# Patient Record
Sex: Male | Born: 1965 | Race: Black or African American | Hispanic: No | Marital: Married | Smoking: Current every day smoker
Health system: Southern US, Community
[De-identification: ages and names within clinical notes are randomized; demographics above are authoritative.]

## PROBLEM LIST (undated history)

## (undated) DIAGNOSIS — E079 Disorder of thyroid, unspecified: Secondary | ICD-10-CM

## (undated) DIAGNOSIS — E785 Hyperlipidemia, unspecified: Secondary | ICD-10-CM

## (undated) DIAGNOSIS — I2699 Other pulmonary embolism without acute cor pulmonale: Secondary | ICD-10-CM

## (undated) DIAGNOSIS — N209 Urinary calculus, unspecified: Secondary | ICD-10-CM

## (undated) DIAGNOSIS — G4730 Sleep apnea, unspecified: Secondary | ICD-10-CM

## (undated) DIAGNOSIS — R739 Hyperglycemia, unspecified: Secondary | ICD-10-CM

## (undated) DIAGNOSIS — Z7901 Long term (current) use of anticoagulants: Secondary | ICD-10-CM

## (undated) DIAGNOSIS — J309 Allergic rhinitis, unspecified: Secondary | ICD-10-CM

## (undated) DIAGNOSIS — L259 Unspecified contact dermatitis, unspecified cause: Secondary | ICD-10-CM

## (undated) DIAGNOSIS — K219 Gastro-esophageal reflux disease without esophagitis: Secondary | ICD-10-CM

## (undated) DIAGNOSIS — T7840XA Allergy, unspecified, initial encounter: Secondary | ICD-10-CM

## (undated) DIAGNOSIS — R7989 Other specified abnormal findings of blood chemistry: Secondary | ICD-10-CM

## (undated) DIAGNOSIS — N529 Male erectile dysfunction, unspecified: Secondary | ICD-10-CM

## (undated) DIAGNOSIS — E119 Type 2 diabetes mellitus without complications: Secondary | ICD-10-CM

## (undated) DIAGNOSIS — M545 Low back pain: Secondary | ICD-10-CM

## (undated) DIAGNOSIS — D72819 Decreased white blood cell count, unspecified: Secondary | ICD-10-CM

## (undated) DIAGNOSIS — M1990 Unspecified osteoarthritis, unspecified site: Secondary | ICD-10-CM

## (undated) DIAGNOSIS — F419 Anxiety disorder, unspecified: Secondary | ICD-10-CM

## (undated) DIAGNOSIS — M109 Gout, unspecified: Secondary | ICD-10-CM

## (undated) DIAGNOSIS — I82409 Acute embolism and thrombosis of unspecified deep veins of unspecified lower extremity: Secondary | ICD-10-CM

## (undated) DIAGNOSIS — I10 Essential (primary) hypertension: Secondary | ICD-10-CM

## (undated) DIAGNOSIS — I4891 Unspecified atrial fibrillation: Secondary | ICD-10-CM

## (undated) HISTORY — DX: Unspecified osteoarthritis, unspecified site: M19.90

## (undated) HISTORY — DX: Other specified abnormal findings of blood chemistry: R79.89

## (undated) HISTORY — PX: CYSTECTOMY: SUR359

## (undated) HISTORY — DX: Anxiety disorder, unspecified: F41.9

## (undated) HISTORY — DX: Sleep apnea, unspecified: G47.30

## (undated) HISTORY — DX: Hyperlipidemia, unspecified: E78.5

## (undated) HISTORY — DX: Unspecified atrial fibrillation: I48.91

## (undated) HISTORY — DX: Long term (current) use of anticoagulants: Z79.01

## (undated) HISTORY — DX: Essential (primary) hypertension: I10

## (undated) HISTORY — DX: Acute embolism and thrombosis of unspecified deep veins of unspecified lower extremity: I82.409

## (undated) HISTORY — PX: KNEE ARTHROSCOPY W/ ACL RECONSTRUCTION: SHX1858

## (undated) HISTORY — DX: Type 2 diabetes mellitus without complications: E11.9

## (undated) HISTORY — DX: Decreased white blood cell count, unspecified: D72.819

## (undated) HISTORY — DX: Allergic rhinitis, unspecified: J30.9

## (undated) HISTORY — DX: Other pulmonary embolism without acute cor pulmonale: I26.99

## (undated) HISTORY — DX: Hyperglycemia, unspecified: R73.9

## (undated) HISTORY — DX: Urinary calculus, unspecified: N20.9

## (undated) HISTORY — DX: Male erectile dysfunction, unspecified: N52.9

## (undated) HISTORY — PX: KNEE ARTHROSCOPY: SUR90

## (undated) HISTORY — DX: Allergy, unspecified, initial encounter: T78.40XA

## (undated) HISTORY — DX: Unspecified contact dermatitis, unspecified cause: L25.9

## (undated) HISTORY — DX: Gout, unspecified: M10.9

## (undated) HISTORY — DX: Low back pain: M54.5

## (undated) NOTE — *Deleted (*Deleted)
Patient ID: Spencer English                 DOB: [birth year]                      MRN: [id number]     HPI: Spencer English is a 22 y.o. male referred by Dr. Johney Frame to HTN clinic. PMH is significant for HTN, DVT, PE, pulmonary hypertension, OSA, and A Fib.  Current HTN meds: furosemide 40mg  daily, losartan 100mg  daily, Toprol 37.5mg  daily, spironolactone 25mg  daily, verapamil 240mg  daily Previously tried: hydralazine 10mg , Lopressor 25mg  BP goal:   Family History: Mother: DM, HTN, HLD.  Father: DM, HTN, HLD, MI  Social History:   Diet:   Exercise:   Home BP readings:   Wt Readings from Last 3 Encounters:  08/21/20 280 lb (127 kg)  08/09/20 273 lb (123.8 kg)  07/23/20 (!) 275 lb (124.7 kg)   BP Readings from Last 3 Encounters:  09/12/20 (!) 148/93  08/21/20 123/81  08/09/20 (!) 162/100   Pulse Readings from Last 3 Encounters:  09/12/20 76  08/21/20 65  08/09/20 84    Renal function: CrCl cannot be calculated (Patient's most recent lab result is older than the maximum 21 days allowed.).  Past Medical History:  Diagnosis Date  . ALLERGIC RHINITIS 04/12/2009  . Allergy   . Anxiety   . Arthritis   . Atrial fibrillation (HCC)   . BACK PAIN, LUMBAR 01/14/2011  . DEEP VENOUS THROMBOPHLEBITIS, LEG, LEFT 05/05/2009  . DM 08/08/2008  . DYSLIPIDEMIA 04/26/2009  . ECZEMA 05/23/2010  . 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 GERD (gastroesophageal reflux disease)   . GOUT 10/24/2010  . Hyperglycemia   . HYPERTENSION 07/21/2007  . 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 Leukopenia   . Long term (current) use of anticoagulants 04/03/2011  . PULMONARY EMBOLISM 05/03/2009  . Sleep apnea    CPAP at bedtime  . Thyroid disease    pt thinks hyperthyroidism  . UNSPECIFIED URINARY CALCULUS     Current Outpatient Medications on File Prior to Visit  Medication Sig Dispense Refill  . allopurinol (ZYLOPRIM) 300 MG tablet Take 300 mg by mouth as needed (gout flare up).    Marland Kitchen atorvastatin (LIPITOR) 20 MG  tablet Take 1 tablet (20 mg total) by mouth daily. 30 tablet 0  . FARXIGA 5 MG TABS tablet TAKE 1 TABLET BY MOUTH DAILY BEFORE BREAKFAST 30 tablet 11  . fluticasone (FLONASE) 50 MCG/ACT nasal spray Place 1 spray into both nostrils daily. allergies    . furosemide (LASIX) 40 MG tablet Take 1 tablet (40 mg total) by mouth daily. 30 tablet 1  . glucose blood (CONTOUR NEXT TEST) test strip 1 each by Other route daily. And lancets 1/day 100 each 3  . halobetasol (ULTRAVATE) 0.05 % cream APPLY CREAM TOPICALLY THREE TIMES DAILY AS NEEDED FOR RASH 15 g 0  . losartan (COZAAR) 100 MG tablet Take 1 tablet (100 mg total) by mouth daily. 30 tablet 1  . methimazole (TAPAZOLE) 10 MG tablet Take 1 tablet (10 mg total) by mouth daily. 90 tablet 3  . metoprolol succinate (TOPROL-XL) 25 MG 24 hr tablet TAKE 1 AND 1/2 TABLETS(37.5 MG) BY MOUTH DAILY 45 tablet 1  . rivaroxaban (XARELTO) 20 MG TABS tablet TAKE 1 TABLET(20 MG) BY MOUTH DAILY WITH SUPPER 30 tablet 1  . sitaGLIPtin (JANUVIA) 100 MG tablet Take 1 tablet (100 mg total) by mouth daily. 90 tablet 3  .  spironolactone (ALDACTONE) 25 MG tablet Take 1 tablet (25 mg total) by mouth daily. (Patient not taking: Reported on 08/21/2020) 30 tablet 11  . verapamil (CALAN-SR) 240 MG CR tablet Take 1 tablet (240 mg total) by mouth at bedtime. 30 tablet 1  . Verapamil HCl CR 300 MG CP24 Take 1 capsule (300 mg total) by mouth daily. 60 capsule 0   No current facility-administered medications on file prior to visit.    Allergies  Allergen Reactions  . Metformin Diarrhea and Nausea Only  . Viagra [Sildenafil Citrate] Other (See Comments)    headache     Assessment/Plan:  1. Hypertension -

---
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
## 1998-09-13 ENCOUNTER — Emergency Department (HOSPITAL_COMMUNITY): Admission: EM | Admit: 1998-09-13 | Discharge: 1998-09-13 | Payer: Self-pay

## 1998-12-29 HISTORY — PX: ANTERIOR CRUCIATE LIGAMENT REPAIR: SHX115

## 1999-05-14 ENCOUNTER — Emergency Department (HOSPITAL_COMMUNITY): Admission: EM | Admit: 1999-05-14 | Discharge: 1999-05-14 | Payer: Self-pay | Admitting: Emergency Medicine

## 1999-08-12 ENCOUNTER — Emergency Department (HOSPITAL_COMMUNITY): Admission: EM | Admit: 1999-08-12 | Discharge: 1999-08-12 | Payer: Self-pay

## 2000-03-21 ENCOUNTER — Emergency Department (HOSPITAL_COMMUNITY): Admission: EM | Admit: 2000-03-21 | Discharge: 2000-03-21 | Payer: Self-pay | Admitting: Emergency Medicine

## 2005-03-06 ENCOUNTER — Ambulatory Visit: Payer: Self-pay | Admitting: Endocrinology

## 2005-03-14 ENCOUNTER — Ambulatory Visit: Payer: Self-pay | Admitting: Endocrinology

## 2005-05-02 ENCOUNTER — Ambulatory Visit: Payer: Self-pay | Admitting: Endocrinology

## 2005-05-27 ENCOUNTER — Ambulatory Visit: Payer: Self-pay | Admitting: Internal Medicine

## 2005-08-22 ENCOUNTER — Ambulatory Visit: Payer: Self-pay | Admitting: Endocrinology

## 2006-04-02 ENCOUNTER — Ambulatory Visit: Payer: Self-pay | Admitting: Internal Medicine

## 2006-09-17 ENCOUNTER — Ambulatory Visit: Payer: Self-pay | Admitting: Endocrinology

## 2006-10-28 ENCOUNTER — Ambulatory Visit: Payer: Self-pay | Admitting: Endocrinology

## 2006-10-30 ENCOUNTER — Ambulatory Visit: Payer: Self-pay | Admitting: Endocrinology

## 2006-10-30 LAB — CONVERTED CEMR LAB
Basophils Absolute: 0 10*3/uL (ref 0.0–0.1)
CO2: 30 meq/L (ref 19–32)
Calcium: 9.2 mg/dL (ref 8.4–10.5)
Chloride: 103 meq/L (ref 96–112)
Creatinine, Ser: 1.3 mg/dL (ref 0.4–1.5)
Creatinine,U: 480 mg/dL
Glomerular Filtration Rate, Af Am: 78 mL/min/{1.73_m2}
Glucose, Bld: 112 mg/dL — ABNORMAL HIGH (ref 70–99)
Iron: 79 ug/dL (ref 42–165)
Lymphocytes Relative: 32.6 % (ref 12.0–46.0)
MCV: 84.1 fL (ref 78.0–100.0)
Microalb, Ur: 1.2 mg/dL (ref 0.0–1.9)
Monocytes Absolute: 0.6 10*3/uL (ref 0.2–0.7)
Monocytes Relative: 12.7 % — ABNORMAL HIGH (ref 3.0–11.0)
Nitrite: NEGATIVE
Platelets: 217 10*3/uL (ref 150–400)
TSH: 0.85 microintl units/mL (ref 0.35–5.50)
Testosterone, total: 3.715 ng/mL
Transferrin: 240.5 mg/dL (ref >=212.0)
Triglyceride fasting, serum: 55 mg/dL (ref 0–149)
Urine Glucose: NEGATIVE mg/dL

## 2006-11-03 ENCOUNTER — Ambulatory Visit: Payer: Self-pay | Admitting: Endocrinology

## 2006-12-19 ENCOUNTER — Ambulatory Visit: Payer: Self-pay | Admitting: Internal Medicine

## 2007-04-19 ENCOUNTER — Ambulatory Visit: Payer: Self-pay | Admitting: Internal Medicine

## 2007-04-21 ENCOUNTER — Emergency Department (HOSPITAL_COMMUNITY): Admission: EM | Admit: 2007-04-21 | Discharge: 2007-04-22 | Payer: Self-pay | Admitting: Emergency Medicine

## 2007-06-11 ENCOUNTER — Ambulatory Visit: Payer: Self-pay | Admitting: Endocrinology

## 2007-07-05 ENCOUNTER — Encounter: Admission: RE | Admit: 2007-07-05 | Discharge: 2007-07-05 | Payer: Self-pay | Admitting: Internal Medicine

## 2007-07-21 ENCOUNTER — Encounter: Payer: Self-pay | Admitting: Endocrinology

## 2007-07-21 DIAGNOSIS — I10 Essential (primary) hypertension: Secondary | ICD-10-CM | POA: Insufficient documentation

## 2007-07-21 HISTORY — DX: Essential (primary) hypertension: I10

## 2007-09-16 ENCOUNTER — Encounter: Admission: RE | Admit: 2007-09-16 | Discharge: 2007-12-08 | Payer: Self-pay | Admitting: Internal Medicine

## 2007-09-22 ENCOUNTER — Emergency Department (HOSPITAL_COMMUNITY): Admission: EM | Admit: 2007-09-22 | Discharge: 2007-09-22 | Payer: Self-pay | Admitting: Emergency Medicine

## 2008-01-31 ENCOUNTER — Ambulatory Visit: Payer: Self-pay | Admitting: Endocrinology

## 2008-01-31 DIAGNOSIS — L723 Sebaceous cyst: Secondary | ICD-10-CM | POA: Insufficient documentation

## 2008-01-31 DIAGNOSIS — N529 Male erectile dysfunction, unspecified: Secondary | ICD-10-CM | POA: Insufficient documentation

## 2008-01-31 HISTORY — DX: Male erectile dysfunction, unspecified: N52.9

## 2008-02-01 ENCOUNTER — Telehealth (INDEPENDENT_AMBULATORY_CARE_PROVIDER_SITE_OTHER): Payer: Self-pay | Admitting: *Deleted

## 2008-02-14 ENCOUNTER — Ambulatory Visit: Payer: Self-pay | Admitting: Endocrinology

## 2008-02-14 ENCOUNTER — Encounter (INDEPENDENT_AMBULATORY_CARE_PROVIDER_SITE_OTHER): Payer: Self-pay | Admitting: *Deleted

## 2008-03-02 ENCOUNTER — Encounter: Payer: Self-pay | Admitting: Endocrinology

## 2008-04-01 ENCOUNTER — Emergency Department (HOSPITAL_COMMUNITY): Admission: EM | Admit: 2008-04-01 | Discharge: 2008-04-01 | Payer: Self-pay | Admitting: Family Medicine

## 2008-04-13 ENCOUNTER — Telehealth: Payer: Self-pay | Admitting: Internal Medicine

## 2008-04-17 ENCOUNTER — Telehealth (INDEPENDENT_AMBULATORY_CARE_PROVIDER_SITE_OTHER): Payer: Self-pay | Admitting: *Deleted

## 2008-05-26 ENCOUNTER — Encounter (INDEPENDENT_AMBULATORY_CARE_PROVIDER_SITE_OTHER): Payer: Self-pay | Admitting: General Surgery

## 2008-05-26 ENCOUNTER — Ambulatory Visit (HOSPITAL_BASED_OUTPATIENT_CLINIC_OR_DEPARTMENT_OTHER): Admission: RE | Admit: 2008-05-26 | Discharge: 2008-05-26 | Payer: Self-pay | Admitting: General Surgery

## 2008-06-06 ENCOUNTER — Encounter: Admission: RE | Admit: 2008-06-06 | Discharge: 2008-06-06 | Payer: Self-pay | Admitting: Internal Medicine

## 2008-06-20 ENCOUNTER — Telehealth (INDEPENDENT_AMBULATORY_CARE_PROVIDER_SITE_OTHER): Payer: Self-pay | Admitting: *Deleted

## 2008-06-20 ENCOUNTER — Ambulatory Visit: Payer: Self-pay | Admitting: Vascular Surgery

## 2008-06-20 ENCOUNTER — Emergency Department (HOSPITAL_COMMUNITY): Admission: EM | Admit: 2008-06-20 | Discharge: 2008-06-20 | Payer: Self-pay | Admitting: Emergency Medicine

## 2008-06-20 ENCOUNTER — Encounter (INDEPENDENT_AMBULATORY_CARE_PROVIDER_SITE_OTHER): Payer: Self-pay | Admitting: Emergency Medicine

## 2008-08-08 ENCOUNTER — Ambulatory Visit: Payer: Self-pay | Admitting: Endocrinology

## 2008-08-08 DIAGNOSIS — E1169 Type 2 diabetes mellitus with other specified complication: Secondary | ICD-10-CM | POA: Insufficient documentation

## 2008-08-08 DIAGNOSIS — E119 Type 2 diabetes mellitus without complications: Secondary | ICD-10-CM

## 2008-08-08 DIAGNOSIS — E118 Type 2 diabetes mellitus with unspecified complications: Secondary | ICD-10-CM | POA: Insufficient documentation

## 2008-08-08 HISTORY — DX: Type 2 diabetes mellitus without complications: E11.9

## 2008-08-08 LAB — CONVERTED CEMR LAB: Hgb A1c MFr Bld: 7.3 % — ABNORMAL HIGH (ref 4.6–6.0)

## 2008-08-14 ENCOUNTER — Telehealth: Payer: Self-pay | Admitting: Endocrinology

## 2008-10-30 ENCOUNTER — Telehealth (INDEPENDENT_AMBULATORY_CARE_PROVIDER_SITE_OTHER): Payer: Self-pay | Admitting: *Deleted

## 2008-11-10 ENCOUNTER — Ambulatory Visit: Payer: Self-pay | Admitting: Endocrinology

## 2008-11-10 LAB — CONVERTED CEMR LAB: Hgb A1c MFr Bld: 7.2 % — ABNORMAL HIGH (ref 4.6–6.0)

## 2008-12-15 ENCOUNTER — Telehealth (INDEPENDENT_AMBULATORY_CARE_PROVIDER_SITE_OTHER): Payer: Self-pay | Admitting: *Deleted

## 2009-01-23 ENCOUNTER — Ambulatory Visit: Payer: Self-pay | Admitting: Endocrinology

## 2009-01-23 DIAGNOSIS — R05 Cough: Secondary | ICD-10-CM

## 2009-01-23 DIAGNOSIS — R059 Cough, unspecified: Secondary | ICD-10-CM | POA: Insufficient documentation

## 2009-01-24 ENCOUNTER — Telehealth (INDEPENDENT_AMBULATORY_CARE_PROVIDER_SITE_OTHER): Payer: Self-pay | Admitting: *Deleted

## 2009-03-11 ENCOUNTER — Emergency Department (HOSPITAL_COMMUNITY): Admission: EM | Admit: 2009-03-11 | Discharge: 2009-03-11 | Payer: Self-pay | Admitting: Emergency Medicine

## 2009-03-11 ENCOUNTER — Ambulatory Visit: Payer: Self-pay | Admitting: Vascular Surgery

## 2009-03-11 ENCOUNTER — Encounter (INDEPENDENT_AMBULATORY_CARE_PROVIDER_SITE_OTHER): Payer: Self-pay | Admitting: Emergency Medicine

## 2009-04-12 ENCOUNTER — Ambulatory Visit: Payer: Self-pay | Admitting: Endocrinology

## 2009-04-12 DIAGNOSIS — M25579 Pain in unspecified ankle and joints of unspecified foot: Secondary | ICD-10-CM | POA: Insufficient documentation

## 2009-04-12 DIAGNOSIS — J309 Allergic rhinitis, unspecified: Secondary | ICD-10-CM | POA: Insufficient documentation

## 2009-04-12 HISTORY — DX: Allergic rhinitis, unspecified: J30.9

## 2009-04-25 ENCOUNTER — Ambulatory Visit: Payer: Self-pay | Admitting: Endocrinology

## 2009-04-26 ENCOUNTER — Observation Stay (HOSPITAL_COMMUNITY): Admission: EM | Admit: 2009-04-26 | Discharge: 2009-04-30 | Payer: Self-pay | Admitting: Emergency Medicine

## 2009-04-26 ENCOUNTER — Ambulatory Visit: Payer: Self-pay | Admitting: Internal Medicine

## 2009-04-26 ENCOUNTER — Encounter: Payer: Self-pay | Admitting: Internal Medicine

## 2009-04-26 DIAGNOSIS — E785 Hyperlipidemia, unspecified: Secondary | ICD-10-CM

## 2009-04-26 DIAGNOSIS — R079 Chest pain, unspecified: Secondary | ICD-10-CM | POA: Insufficient documentation

## 2009-04-26 HISTORY — DX: Hyperlipidemia, unspecified: E78.5

## 2009-04-27 ENCOUNTER — Ambulatory Visit: Payer: Self-pay | Admitting: Vascular Surgery

## 2009-04-27 ENCOUNTER — Encounter: Payer: Self-pay | Admitting: Internal Medicine

## 2009-04-27 LAB — CONVERTED CEMR LAB
ALT: 33 units/L (ref 0–53)
BUN: 17 mg/dL (ref 6–23)
Basophils Absolute: 0 10*3/uL (ref 0.0–0.1)
Bilirubin, Direct: 0.3 mg/dL (ref 0.0–0.3)
Chloride: 99 meq/L (ref 96–112)
Cholesterol: 224 mg/dL — ABNORMAL HIGH (ref 0–200)
Creatinine, Ser: 1.5 mg/dL (ref 0.4–1.5)
Creatinine,U: 385.6 mg/dL
Eosinophils Absolute: 0.3 10*3/uL (ref 0.0–0.7)
Eosinophils Relative: 5.6 % — ABNORMAL HIGH (ref 0.0–5.0)
Glucose, Bld: 140 mg/dL — ABNORMAL HIGH (ref 70–99)
HDL: 37.1 mg/dL — ABNORMAL LOW (ref >39.00)
Hemoglobin, Urine: NEGATIVE
Hgb A1c MFr Bld: 7.2 % — ABNORMAL HIGH (ref 4.6–6.5)
Ketones, ur: NEGATIVE mg/dL
Lymphs Abs: 1.4 10*3/uL (ref 0.7–4.0)
MCV: 82.3 fL (ref 78.0–100.0)
Microalb Creat Ratio: 4.7 mg/g (ref 0.0–30.0)
Microalb, Ur: 1.8 mg/dL (ref 0.0–1.9)
Monocytes Absolute: 0.6 10*3/uL (ref 0.1–1.0)
Neutrophils Relative %: 56.5 % (ref 43.0–77.0)
PSA: 0.99 ng/mL (ref 0.10–4.00)
Platelets: 187 10*3/uL (ref 150.0–400.0)
RDW: 15.1 % — ABNORMAL HIGH (ref 11.5–14.6)
TSH: 0.84 microintl units/mL (ref 0.35–5.50)
Total Bilirubin: 2 mg/dL — ABNORMAL HIGH (ref 0.3–1.2)
Urine Glucose: NEGATIVE mg/dL
Urobilinogen, UA: 0.2 (ref 0.0–1.0)
VLDL: 11.6 mg/dL (ref 0.0–40.0)
Vitamin B-12: 343 pg/mL (ref 211–911)
WBC: 5.4 10*3/uL (ref 4.5–10.5)

## 2009-05-01 ENCOUNTER — Ambulatory Visit: Payer: Self-pay | Admitting: Cardiology

## 2009-05-01 LAB — CONVERTED CEMR LAB: INR: 2.9 — ABNORMAL HIGH (ref 0.8–1.0)

## 2009-05-03 ENCOUNTER — Encounter (INDEPENDENT_AMBULATORY_CARE_PROVIDER_SITE_OTHER): Payer: Self-pay | Admitting: *Deleted

## 2009-05-03 ENCOUNTER — Telehealth (INDEPENDENT_AMBULATORY_CARE_PROVIDER_SITE_OTHER): Payer: Self-pay | Admitting: *Deleted

## 2009-05-03 ENCOUNTER — Ambulatory Visit: Payer: Self-pay | Admitting: Endocrinology

## 2009-05-03 DIAGNOSIS — I2782 Chronic pulmonary embolism: Secondary | ICD-10-CM | POA: Insufficient documentation

## 2009-05-03 DIAGNOSIS — I2699 Other pulmonary embolism without acute cor pulmonale: Secondary | ICD-10-CM

## 2009-05-03 HISTORY — DX: Other pulmonary embolism without acute cor pulmonale: I26.99

## 2009-05-04 ENCOUNTER — Telehealth (INDEPENDENT_AMBULATORY_CARE_PROVIDER_SITE_OTHER): Payer: Self-pay | Admitting: *Deleted

## 2009-05-05 ENCOUNTER — Ambulatory Visit: Payer: Self-pay | Admitting: Internal Medicine

## 2009-05-05 ENCOUNTER — Telehealth (INDEPENDENT_AMBULATORY_CARE_PROVIDER_SITE_OTHER): Payer: Self-pay | Admitting: *Deleted

## 2009-05-05 DIAGNOSIS — I82409 Acute embolism and thrombosis of unspecified deep veins of unspecified lower extremity: Secondary | ICD-10-CM

## 2009-05-05 DIAGNOSIS — M79609 Pain in unspecified limb: Secondary | ICD-10-CM | POA: Insufficient documentation

## 2009-05-05 HISTORY — DX: Acute embolism and thrombosis of unspecified deep veins of unspecified lower extremity: I82.409

## 2009-05-08 ENCOUNTER — Ambulatory Visit: Payer: Self-pay | Admitting: Internal Medicine

## 2009-05-10 ENCOUNTER — Telehealth (INDEPENDENT_AMBULATORY_CARE_PROVIDER_SITE_OTHER): Payer: Self-pay | Admitting: *Deleted

## 2009-05-17 ENCOUNTER — Ambulatory Visit: Payer: Self-pay | Admitting: Internal Medicine

## 2009-05-17 ENCOUNTER — Ambulatory Visit: Payer: Self-pay | Admitting: Pulmonary Disease

## 2009-05-30 ENCOUNTER — Encounter: Payer: Self-pay | Admitting: *Deleted

## 2009-05-31 ENCOUNTER — Ambulatory Visit: Payer: Self-pay | Admitting: Internal Medicine

## 2009-05-31 LAB — CONVERTED CEMR LAB
POC INR: 3
Protime: 20.9

## 2009-06-21 ENCOUNTER — Ambulatory Visit: Payer: Self-pay | Admitting: Internal Medicine

## 2009-06-21 LAB — CONVERTED CEMR LAB: Prothrombin Time: 12.7 s

## 2009-07-04 ENCOUNTER — Encounter: Payer: Self-pay | Admitting: *Deleted

## 2009-07-04 ENCOUNTER — Encounter (INDEPENDENT_AMBULATORY_CARE_PROVIDER_SITE_OTHER): Payer: Self-pay | Admitting: Cardiology

## 2009-07-04 ENCOUNTER — Ambulatory Visit: Payer: Self-pay | Admitting: Internal Medicine

## 2009-07-04 LAB — CONVERTED CEMR LAB: Prothrombin Time: 19.2 s

## 2009-08-07 ENCOUNTER — Telehealth (INDEPENDENT_AMBULATORY_CARE_PROVIDER_SITE_OTHER): Payer: Self-pay | Admitting: *Deleted

## 2009-08-07 ENCOUNTER — Telehealth (INDEPENDENT_AMBULATORY_CARE_PROVIDER_SITE_OTHER): Payer: Self-pay | Admitting: Cardiology

## 2009-08-13 ENCOUNTER — Telehealth (INDEPENDENT_AMBULATORY_CARE_PROVIDER_SITE_OTHER): Payer: Self-pay | Admitting: *Deleted

## 2009-08-16 ENCOUNTER — Ambulatory Visit: Payer: Self-pay | Admitting: Cardiology

## 2009-08-16 ENCOUNTER — Encounter (INDEPENDENT_AMBULATORY_CARE_PROVIDER_SITE_OTHER): Payer: Self-pay | Admitting: *Deleted

## 2009-08-17 ENCOUNTER — Telehealth: Payer: Self-pay | Admitting: Endocrinology

## 2009-09-06 ENCOUNTER — Ambulatory Visit: Payer: Self-pay | Admitting: Cardiovascular Disease

## 2009-09-06 LAB — CONVERTED CEMR LAB: POC INR: 2.3

## 2009-09-24 ENCOUNTER — Ambulatory Visit: Payer: Self-pay | Admitting: Endocrinology

## 2009-09-25 ENCOUNTER — Ambulatory Visit: Payer: Self-pay | Admitting: Endocrinology

## 2009-09-25 LAB — CONVERTED CEMR LAB
Creatinine,U: 105.8 mg/dL
Hgb A1c MFr Bld: 6.8 % — ABNORMAL HIGH (ref 4.6–6.5)
Microalb Creat Ratio: 1.9 mg/g (ref 0.0–30.0)
Sed Rate: 9 mm/hr (ref 0–22)
Testosterone: 405.07 ng/dL (ref 350.00–890.00)

## 2009-10-04 ENCOUNTER — Ambulatory Visit: Payer: Self-pay | Admitting: Internal Medicine

## 2009-10-04 LAB — CONVERTED CEMR LAB: POC INR: 1.9

## 2009-10-11 ENCOUNTER — Telehealth: Payer: Self-pay | Admitting: Endocrinology

## 2009-10-25 ENCOUNTER — Ambulatory Visit: Payer: Self-pay | Admitting: Endocrinology

## 2009-10-25 ENCOUNTER — Ambulatory Visit: Payer: Self-pay | Admitting: Cardiology

## 2009-10-25 DIAGNOSIS — R7989 Other specified abnormal findings of blood chemistry: Secondary | ICD-10-CM | POA: Insufficient documentation

## 2009-10-25 DIAGNOSIS — R609 Edema, unspecified: Secondary | ICD-10-CM | POA: Insufficient documentation

## 2009-10-25 HISTORY — DX: Other specified abnormal findings of blood chemistry: R79.89

## 2009-10-25 LAB — CONVERTED CEMR LAB
BUN: 14 mg/dL (ref 6–23)
CO2: 28 meq/L (ref 19–32)
Glucose, Bld: 85 mg/dL (ref 70–99)
Potassium: 4 meq/L (ref 3.5–5.1)
Sodium: 137 meq/L (ref 135–145)

## 2009-11-15 ENCOUNTER — Ambulatory Visit: Payer: Self-pay | Admitting: Cardiology

## 2009-11-21 ENCOUNTER — Telehealth: Payer: Self-pay | Admitting: Internal Medicine

## 2009-12-06 ENCOUNTER — Ambulatory Visit: Payer: Self-pay | Admitting: Endocrinology

## 2009-12-11 ENCOUNTER — Telehealth: Payer: Self-pay | Admitting: Endocrinology

## 2009-12-17 ENCOUNTER — Emergency Department (HOSPITAL_BASED_OUTPATIENT_CLINIC_OR_DEPARTMENT_OTHER): Admission: EM | Admit: 2009-12-17 | Discharge: 2009-12-17 | Payer: Self-pay | Admitting: Emergency Medicine

## 2010-01-24 ENCOUNTER — Ambulatory Visit: Payer: Self-pay | Admitting: Internal Medicine

## 2010-01-24 LAB — CONVERTED CEMR LAB: POC INR: 1.2

## 2010-02-20 ENCOUNTER — Emergency Department (HOSPITAL_BASED_OUTPATIENT_CLINIC_OR_DEPARTMENT_OTHER): Admission: EM | Admit: 2010-02-20 | Discharge: 2010-02-20 | Payer: Self-pay | Admitting: Emergency Medicine

## 2010-03-06 ENCOUNTER — Ambulatory Visit: Payer: Self-pay | Admitting: Cardiovascular Disease

## 2010-03-06 LAB — CONVERTED CEMR LAB: POC INR: 2.1

## 2010-03-25 ENCOUNTER — Emergency Department (HOSPITAL_BASED_OUTPATIENT_CLINIC_OR_DEPARTMENT_OTHER): Admission: EM | Admit: 2010-03-25 | Discharge: 2010-03-25 | Payer: Self-pay | Admitting: Emergency Medicine

## 2010-04-04 ENCOUNTER — Ambulatory Visit: Payer: Self-pay | Admitting: Cardiovascular Disease

## 2010-05-23 ENCOUNTER — Ambulatory Visit: Payer: Self-pay | Admitting: Endocrinology

## 2010-05-23 DIAGNOSIS — L259 Unspecified contact dermatitis, unspecified cause: Secondary | ICD-10-CM

## 2010-05-23 HISTORY — DX: Unspecified contact dermatitis, unspecified cause: L25.9

## 2010-06-20 ENCOUNTER — Encounter (INDEPENDENT_AMBULATORY_CARE_PROVIDER_SITE_OTHER): Payer: Self-pay | Admitting: Pharmacist

## 2010-06-20 ENCOUNTER — Ambulatory Visit: Payer: Self-pay | Admitting: Internal Medicine

## 2010-06-20 LAB — CONVERTED CEMR LAB: POC INR: 1.5

## 2010-06-29 ENCOUNTER — Emergency Department (HOSPITAL_BASED_OUTPATIENT_CLINIC_OR_DEPARTMENT_OTHER): Admission: EM | Admit: 2010-06-29 | Discharge: 2010-06-29 | Payer: Self-pay | Admitting: Emergency Medicine

## 2010-07-04 ENCOUNTER — Ambulatory Visit: Payer: Self-pay | Admitting: Internal Medicine

## 2010-07-04 ENCOUNTER — Ambulatory Visit: Payer: Self-pay | Admitting: Endocrinology

## 2010-07-04 DIAGNOSIS — N209 Urinary calculus, unspecified: Secondary | ICD-10-CM | POA: Insufficient documentation

## 2010-07-04 HISTORY — DX: Urinary calculus, unspecified: N20.9

## 2010-07-04 LAB — CONVERTED CEMR LAB: POC INR: 1.6

## 2010-07-05 LAB — CONVERTED CEMR LAB
AST: 23 units/L (ref 0–37)
Albumin: 3.8 g/dL (ref 3.5–5.2)
BUN: 17 mg/dL (ref 6–23)
Basophils Absolute: 0.1 10*3/uL (ref 0.0–0.1)
Bilirubin Urine: NEGATIVE
CO2: 30 meq/L (ref 19–32)
Chloride: 106 meq/L (ref 96–112)
Cholesterol: 172 mg/dL (ref 0–200)
Glucose, Bld: 94 mg/dL (ref 70–99)
HCT: 41.9 % (ref 39.0–52.0)
Hemoglobin: 14 g/dL (ref 13.0–17.0)
Lymphs Abs: 1.6 10*3/uL (ref 0.7–4.0)
MCHC: 33.5 g/dL (ref 30.0–36.0)
MCV: 82.8 fL (ref 78.0–100.0)
Monocytes Absolute: 0.7 10*3/uL (ref 0.1–1.0)
Monocytes Relative: 11.7 % (ref 3.0–12.0)
Neutro Abs: 3 10*3/uL (ref 1.4–7.7)
Nitrite: NEGATIVE
PSA: 0.78 ng/mL (ref 0.10–4.00)
Platelets: 186 10*3/uL (ref 150.0–400.0)
Potassium: 4.5 meq/L (ref 3.5–5.1)
RDW: 16.6 % — ABNORMAL HIGH (ref 11.5–14.6)
Sodium: 142 meq/L (ref 135–145)
TSH: 0.8 microintl units/mL (ref 0.35–5.50)
Total Protein, Urine: NEGATIVE mg/dL
Uric Acid, Serum: 7.7 mg/dL (ref 4.0–7.8)
Urine Glucose: NEGATIVE mg/dL
pH: 7 (ref 5.0–8.0)

## 2010-07-11 ENCOUNTER — Ambulatory Visit: Payer: Self-pay | Admitting: Internal Medicine

## 2010-08-01 ENCOUNTER — Encounter: Payer: Self-pay | Admitting: Endocrinology

## 2010-08-28 ENCOUNTER — Emergency Department (HOSPITAL_BASED_OUTPATIENT_CLINIC_OR_DEPARTMENT_OTHER): Admission: EM | Admit: 2010-08-28 | Discharge: 2010-08-29 | Payer: Self-pay | Admitting: Emergency Medicine

## 2010-08-29 ENCOUNTER — Telehealth: Payer: Self-pay | Admitting: Endocrinology

## 2010-09-12 ENCOUNTER — Ambulatory Visit: Payer: Self-pay | Admitting: Cardiology

## 2010-09-12 ENCOUNTER — Encounter (INDEPENDENT_AMBULATORY_CARE_PROVIDER_SITE_OTHER): Payer: Self-pay | Admitting: Pharmacist

## 2010-09-12 LAB — CONVERTED CEMR LAB: POC INR: 2.2

## 2010-10-16 ENCOUNTER — Ambulatory Visit: Payer: Self-pay | Admitting: Internal Medicine

## 2010-10-16 LAB — CONVERTED CEMR LAB: POC INR: 1.5

## 2010-10-24 ENCOUNTER — Ambulatory Visit: Payer: Self-pay | Admitting: Endocrinology

## 2010-10-24 DIAGNOSIS — M109 Gout, unspecified: Secondary | ICD-10-CM

## 2010-10-24 HISTORY — DX: Gout, unspecified: M10.9

## 2010-11-13 ENCOUNTER — Telehealth: Payer: Self-pay | Admitting: Endocrinology

## 2010-12-30 ENCOUNTER — Emergency Department (HOSPITAL_BASED_OUTPATIENT_CLINIC_OR_DEPARTMENT_OTHER)
Admission: EM | Admit: 2010-12-30 | Discharge: 2010-12-30 | Payer: Self-pay | Source: Home / Self Care | Admitting: Emergency Medicine

## 2011-01-01 ENCOUNTER — Telehealth: Payer: Self-pay | Admitting: Cardiology

## 2011-01-06 ENCOUNTER — Ambulatory Visit: Admit: 2011-01-06 | Payer: Self-pay

## 2011-01-08 ENCOUNTER — Ambulatory Visit
Admission: RE | Admit: 2011-01-08 | Discharge: 2011-01-08 | Payer: Self-pay | Source: Home / Self Care | Attending: Internal Medicine | Admitting: Internal Medicine

## 2011-01-14 ENCOUNTER — Ambulatory Visit
Admission: RE | Admit: 2011-01-14 | Discharge: 2011-01-14 | Payer: Self-pay | Source: Home / Self Care | Attending: Endocrinology | Admitting: Endocrinology

## 2011-01-14 DIAGNOSIS — M545 Low back pain, unspecified: Secondary | ICD-10-CM | POA: Insufficient documentation

## 2011-01-14 HISTORY — DX: Low back pain, unspecified: M54.50

## 2011-01-22 ENCOUNTER — Encounter: Payer: Self-pay | Admitting: Endocrinology

## 2011-01-23 ENCOUNTER — Ambulatory Visit: Admit: 2011-01-23 | Payer: Self-pay

## 2011-01-30 NOTE — Medication Information (Signed)
Summary: rov/tm   Anticoagulant Therapy  Managed by: Weston Brass, PharmD PCP: Minus Breeding MD Supervising MD: Graciela Husbands MD, Viviann Spare Indication 1: Pulmonary Embolism and Infarction (ICD-415.1) Indication 2: Deep Vein Thrombosis - Leg (ICD-451.1) Lab Used: [facility] Site: [facility] INR POC 1.5 INR RANGE 2 - 3  Dietary changes: no    Health status changes: no    Bleeding/hemorrhagic complications: no    Recent/future hospitalizations: no    Any changes in medication regimen? no    Recent/future dental: no  Any missed doses?: yes     Details: Reports missing 1-2x, but can't remember when.    Is patient compliant with meds? yes      Comments: Reports reducing doses this week, because he knew he was going to run out of pills.     Allergies: 1)  ! Metformin Hcl (Metformin Hcl)  Anticoagulation Management History:      The patient is taking warfarin and comes in today for a routine follow up visit.  Positive risk factors for bleeding include presence of serious comorbidities.  Negative risk factors for bleeding include an age less than 29 years old.  The bleeding index is 'intermediate risk'.  Positive CHADS2 values include History of HTN and History of Diabetes.  Negative CHADS2 values include Age > 72 years old.  The start date was 05/01/2009.  His last INR was 2.9 ratio.  Anticoagulation responsible provider: Graciela Husbands MD, Viviann Spare.  INR POC: 1.5.  Cuvette Lot#: [id number].  Exp: 09/2011.    Anticoagulation Management Assessment/Plan:      The patient's current anticoagulation dose is Warfarin sodium 5 mg tabs: Take as directed by coumadin clinic..  The target INR is 2 - 3.  The next INR is due 10/31/2010.  Anticoagulation instructions were given to patient.  Results were reviewed/authorized by Weston Brass, PharmD.  He was notified by Haynes Hoehn, PharmD Candidate.         Prior Anticoagulation Instructions: INR 2.2 Continue 7.5mg s daily except 5mg s on Mondays and Fridays. Recheck in 4  weeks.   Current Anticoagulation Instructions: INR 1.5    Take 2 tables today and tomorrow, then resume normal Coumadin schedule of 1.5 tablets every day of the week, except 1 tablet on Monday and Friday.  Return to clinic in 2 weeks.  Prescriptions: WARFARIN SODIUM 5 MG TABS (WARFARIN SODIUM) Take as directed by coumadin clinic.  #90 x 0   Entered by:   Weston Brass PharmD   Authorized by:   Nathen May, MD, [facility]   Signed by:   Weston Brass PharmD on 10/16/2010   Method used:   Electronically to        [facility] [phone]* (retail)       [address]       [place], [place]  [address]       Ph: [phone]       Fax: [phone]   RxID:   [id number]

## 2011-01-30 NOTE — Assessment & Plan Note (Signed)
Summary: flu shot/medication/#/cd   Vital Signs:  Patient profile:   45 year old male Height:      72 inches (182.88 cm) Weight:      263.13 pounds (119.60 kg) BMI:     35.82 O2 Sat:      99 % on Room air Temp:     98.6 degrees F (37.00 degrees C) oral Pulse rate:   98 / minute BP sitting:   128 / 96  (left arm) Cuff size:   large  Vitals Entered By: Brenton Grills MA (October 24, 2010 3:34 PM)  O2 Flow:  Room air CC: Refills on Cialis, Januvia, Benicar, Halobetasol cream, rash on legs has returned/aj Is Patient Diabetic? Yes   Primary Provider:  Minus Breeding MD  CC:  Refills on Cialis, Januvia, Benicar, Halobetasol cream, and rash on legs has returned/aj.  History of Present Illness: pt states few mos of moderate itching of the right hand left thigh, upper-mid-back, and both legs.  there is assoc hyperpigmentation.  ultravate helps only temporarily.   he has left elbow pain--pt says due to gout.     Current Medications (verified): 1)  Cialis 2.5 Mg  Tabs (Tadalafil) .... Qd 2)  Allegra 180 Mg  Tabs (Fexofenadine Hcl) .Marland Kitchen.. 1 By Mouth Qd 3)  Flonase 50 Mcg/act  Susp (Fluticasone Propionate) .... 2 Sprays Ea Side Qd 4)  Januvia 100 Mg  Tabs (Sitagliptin Phosphate) .... Take 1 By Mouth Qd 5)  Furosemide 40 Mg  Tabs (Furosemide) .... Take 1 By Mouth Once Daily Need Physical Appt No Addtional Refills Until Appt 6)  Hydrocodone-Acetaminophen 10-325 Mg Tabs (Hydrocodone-Acetaminophen) .Marland Kitchen.. 1 Q4h As Needed Pain 7)  Ted Hose To Knees .Marland Kitchen.. 415.19 8)  Warfarin Sodium 5 Mg Tabs (Warfarin Sodium) .... Take As Directed By Coumadin Clinic. 9)  Triamcinolone Acetonide 0.025 % Crea (Triamcinolone Acetonide) .... Three Times A Day As Needed Itching 10)  Ultravate 0.05 % Oint (Halobetasol Propionate) .... Three Times A Day As Needed Itching 11)  Benicar 40 Mg Tabs (Olmesartan Medoxomil) .Marland Kitchen.. 1 Qd 12)  Verapamil Hcl Cr 180 Mg Cr-Tabs (Verapamil Hcl) .Marland Kitchen.. 1 Once Daily 13)  Halobetasol  Propionate 0.05 % Crea (Halobetasol Propionate) .... Three Times A Day As Needed For Rash 14)  Crestor 40 Mg Tabs (Rosuvastatin Calcium) .Marland Kitchen.. 1 Once Daily 15)  Allopurinol 100 Mg Tabs (Allopurinol) .Marland Kitchen.. 1 Once Daily  Allergies (verified): 1)  ! Metformin Hcl (Metformin Hcl)  Past History:  Past Medical History: Last updated: 05/05/2009 Hypertension Smoker Edema Leukopenia Hyperglycemia LE Varicosities Hyperuricemia E.D. PE DVT  Review of Systems  The patient denies weight loss and weight gain.    Physical Exam  General:  obese.  no distress  Msk:  left elbow:  there is slight swelling, and minimal warmth.  no erythema Extremities:  trace right pedal edema and trace left pedal edema.   Skin:  there is a hyperpigmented, eczematous rash, at these areas.    Impression & Recommendations:  Problem # 1:  ECZEMA (ICD-692.9) Assessment Deteriorated  Problem # 2:  GOUT (ICD-274.9) Assessment: Deteriorated  Problem # 3:  DYSLIPIDEMIA (ICD-272.4) needs increased rx CHOL: 172 (07/04/2010)   LDL: 108 (07/04/2010)   HDL: 41.30 (07/04/2010)   TG: 112.0 (07/04/2010)  Medications Added to Medication List This Visit: 1)  Cialis 5 Mg Tabs (Tadalafil) .Marland Kitchen.. 1 tab once daily 2)  Allopurinol 300 Mg Tabs (Allopurinol) .Marland Kitchen.. 1 tab once daily 3)  Verapamil Hcl Cr 240 Mg Cr-tabs (  Verapamil hcl) .Marland Kitchen.. 1 tab once daily  Other Orders: Admin 1st Vaccine (98119) Flu Vaccine 8yrs + (14782) Dermatology Referral (Derma) Est. Patient Level IV (95621)  Patient Instructions: 1)  blood tests are being ordered for you today.  please call [phone] to hear your test results. 2)  refer dermatology.  you will be called with a day and time for an appointment. 3)  take allopurinol 300 mg once daily. 4)  take crestor 40 mg once daily. 5)  Please schedule a follow-up appointment in 3 months. Prescriptions: VERAPAMIL HCL CR 240 MG CR-TABS (VERAPAMIL HCL) 1 tab once daily  #30 x 11   Entered and  Authorized by:   Minus Breeding MD   Signed by:   Minus Breeding MD on 10/24/2010   Method used:   Electronically to        [facility] [phone]* (retail)       [address]       [place], [place]  [address]       Ph: [phone]       Fax: [phone]   RxID:   [id number] CRESTOR 40 MG TABS (ROSUVASTATIN CALCIUM) 1 once daily  #30 x 11   Entered and Authorized by:   Minus Breeding MD   Signed by:   Minus Breeding MD on 10/24/2010   Method used:   Electronically to        [facility] [phone]* (retail)       [address]       [place], [place]  [address]       Ph: [phone]       Fax: [phone]   RxID:   [id number] ALLOPURINOL 300 MG TABS (ALLOPURINOL) 1 tab once daily  #30 x 11   Entered and Authorized by:   Minus Breeding MD   Signed by:   Minus Breeding MD on 10/24/2010   Method used:   Electronically to        [facility] [phone]* (retail)       [address]       [place], [place]  [address]       Ph: [phone]       Fax: [phone]   RxID:   [id number] CIALIS 5 MG TABS (TADALAFIL) 1 tab once daily  #30 x 11   Entered and Authorized by:   Minus Breeding MD   Signed by:   Minus Breeding MD on 10/24/2010   Method used:   Print then Give to Patient   RxID:   [id number]    Orders Added: 1)  Admin 1st Vaccine [90471] 2)  Flu Vaccine 37yrs + [82423] 3)  Dermatology Referral [Derma] 4)  Est. Patient Level IV [53614]        Flu Vaccine Consent Questions     Do you have a history of severe allergic reactions to this vaccine? no    Any prior history of allergic reactions to egg and/or gelatin? no    Do you have a sensitivity to the preservative Thimersol? no    Do you have a past history of Guillan-Barre Syndrome? no    Do you currently have an acute febrile illness? no    Have you ever had a severe reaction to latex? no    Vaccine information given and explained to patient? yes    Are you currently pregnant? no     Lot Number:[id number]   Exp Date:06/28/2011  Site Given  Left Deltoid [id number]

## 2011-01-30 NOTE — Letter (Signed)
Summary: Work Excuse  [facility]  [address]   [place], [place] [address]   Phone: [phone]  Fax: [phone]    Today's Date: September 12, 2010  Name of Patient: Spencer English  The above named patient had a medical visit today at:  am / pm.  Please take this into consideration when reviewing the time away from work/school.    Special Instructions:  [  ] None  [  ] To be off the remainder of today, returning to the normal work / school schedule tomorrow.  [  ] To be off until the next scheduled appointment on ______________________.  [  ] Other ________________________________________________________________ ________________________________________________________________________   Sincerely yours,   Bethena Midget, RN, BSN

## 2011-01-30 NOTE — Progress Notes (Signed)
Summary: Rx refill req  Phone Note Refill Request Message from:  Patient on November 13, 2010 10:25 AM  Refills Requested: Medication #1:  JANUVIA 100 MG  TABS take 1 by mouth qd   Dosage confirmed as above?Dosage Confirmed   Supply Requested: 9 months  Method Requested: Electronic Initial call taken by: Margaret Pyle, CMA,  November 13, 2010 10:25 AM    Prescriptions: JANUVIA 100 MG  TABS (SITAGLIPTIN PHOSPHATE) take 1 by mouth qd  #90 x 2   Entered by:   Margaret Pyle, CMA   Authorized by:   Minus Breeding MD   Signed by:   Margaret Pyle, CMA on 11/13/2010   Method used:   Electronically to        [facility] [phone]* (retail)       [address]       [place], [place]  [address]       Ph: [phone]       Fax: [phone]   RxID:   [id number]

## 2011-01-30 NOTE — Letter (Signed)
Summary: Work Excuse  C[facility]madin  [address]   [place], [place] [address]   Phone: [phone]  Fax: [phone]    Today's Date: September 12, 2010  Name of Patient: Spencer English  The above named patient had a medical visit today   Please take this into consideration when reviewing the time away from work.  Special Instructions:  [  ] None  [  ] To be off the remainder of today, returning to the normal work / school schedule tomorrow.  [  ] To be off until the next scheduled appointment on ______________________.  [  ] Other ________________________________________________________________ ________________________________________________________________________   Sincerely yours,   Bethena Midget, RN, BSN

## 2011-01-30 NOTE — Letter (Signed)
Summary: Primary Care Appointment Letter  [facility]  [address] [place], [place] [address]   Phone: [phone]  Fax: [phone]    01/22/2011 MRN: [id number]  Spencer English [address] [place], [place]  [address]  Dear [name],   Your Primary Care Physician Minus Breeding MD has indicated that:    _______it is time to schedule an appointment.    _______you missed your appointment on______ and need to call and          reschedule.    _______you need to have lab work done.    _______you need to schedule an appointment discuss lab or test results.    ___X____you need to call to reschedule your appointment that is                       scheduled on July 10, 2011 with Dr.Kenetra Hildenbrand for a Physical.  Please call the office.     Please call our office as soon as possible. Our phone number is [phone]. Please press option 1. Our office is open 8a-12noon and 1p-5p, Monday through Friday.     Thank you,    [facility]

## 2011-01-30 NOTE — Medication Information (Signed)
Summary: rov/tm  Anticoagulant Therapy  Managed by: Elaina Pattee, PharmD PCP: Minus Breeding MD Supervising MD: Eden Emms MD, Theron Arista Indication 1: Pulmonary Embolism and Infarction (ICD-415.1) Indication 2: Deep Vein Thrombosis - Leg (ICD-451.1) Lab Used: [facility] Site: [facility] INR POC 2.3 INR RANGE 2 - 3  Dietary changes: no    Health status changes: no    Bleeding/hemorrhagic complications: no    Recent/future hospitalizations: no    Any changes in medication regimen? no    Recent/future dental: no  Any missed doses?: no       Is patient compliant with meds? yes       Allergies: 1)  ! Metformin Hcl (Metformin Hcl)  Anticoagulation Management History:      The patient is taking warfarin and comes in today for a routine follow up visit.  Positive risk factors for bleeding include presence of serious comorbidities.  Negative risk factors for bleeding include an age less than 52 years old.  The bleeding index is 'intermediate risk'.  Positive CHADS2 values include History of HTN and History of Diabetes.  Negative CHADS2 values include Age > 76 years old.  The start date was 05/01/2009.  His last INR was 2.9 ratio.  Anticoagulation responsible provider: Eden Emms MD, Theron Arista.  INR POC: 2.3.  Cuvette Lot#: [id number].  Exp: 04/2011.    Anticoagulation Management Assessment/Plan:      The patient's current anticoagulation dose is Warfarin sodium 5 mg tabs: Take as directed by coumadin clinic..  The target INR is 2 - 3.  The next INR is due 05/02/2010.  Anticoagulation instructions were given to patient.  Results were reviewed/authorized by Elaina Pattee, PharmD.  He was notified by Elaina Pattee, PharmD.         Prior Anticoagulation Instructions: INR 2.1 Continue 1.5 pills everyday except 1 pill on Mondays and Fridays. Recheck in 4 weeks.   Current Anticoagulation Instructions: INR 2.3. Take 1.5 tablets daily except 1 tablet Mon and Fri.

## 2011-01-30 NOTE — Assessment & Plan Note (Signed)
Summary: FOLLOW UP--SS   Vital Signs:  Patient profile:   45 year old male Height:      72 inches (182.88 cm) Weight:      264.25 pounds (120.11 kg) BMI:     35.97 O2 Sat:      97 % on Room air Temp:     98.7 degrees F (37.06 degrees C) oral Pulse rate:   74 / minute Pulse rhythm:   regular BP sitting:   134 / 98  (left arm) Cuff size:   large  Vitals Entered By: Brenton Grills CMA Duncan Dull) (January 14, 2011 3:21 PM)  O2 Flow:  Room air CC: Follow-up visit/refills, samples of Benicar and Cialis/Referral for back pain/aj Is Patient Diabetic? Yes   Primary Provider:  Minus Breeding MD  CC:  Follow-up visit/refills and samples of Benicar and Cialis/Referral for back pain/aj.  History of Present Illness: pt states 1 year of pain at the midline lumbar area.  x-rays were neg at ortho, approx 1 year ago.  no assoc numbness. pt says he broke a tooth 1 year ago, and it has become painful.      Current Medications (verified): 1)  Allegra 180 Mg  Tabs (Fexofenadine Hcl) .Marland Kitchen.. 1 By Mouth Qd 2)  Flonase 50 Mcg/act  Susp (Fluticasone Propionate) .... 2 Sprays Ea Side Qd 3)  Januvia 100 Mg  Tabs (Sitagliptin Phosphate) .... Take 1 By Mouth Qd 4)  Furosemide 40 Mg  Tabs (Furosemide) .... Take 1 By Mouth Once Daily Need Physical Appt No Addtional Refills Until Appt 5)  Hydrocodone-Acetaminophen 10-325 Mg Tabs (Hydrocodone-Acetaminophen) .Marland Kitchen.. 1 Q4h As Needed Pain 6)  Ted Hose To Knees .Marland Kitchen.. 415.19 7)  Warfarin Sodium 5 Mg Tabs (Warfarin Sodium) .... Take As Directed By Coumadin Clinic. 8)  Triamcinolone Acetonide 0.025 % Crea (Triamcinolone Acetonide) .... Three Times A Day As Needed Itching 9)  Benicar 40 Mg Tabs (Olmesartan Medoxomil) .Marland Kitchen.. 1 Qd 10)  Halobetasol Propionate 0.05 % Crea (Halobetasol Propionate) .... Three Times A Day As Needed For Rash 11)  Crestor 40 Mg Tabs (Rosuvastatin Calcium) .Marland Kitchen.. 1 Once Daily 12)  Cialis 5 Mg Tabs (Tadalafil) .Marland Kitchen.. 1 Tab Once Daily 13)  Allopurinol 300  Mg Tabs (Allopurinol) .Marland Kitchen.. 1 Tab Once Daily 14)  Verapamil Hcl Cr 240 Mg Cr-Tabs (Verapamil Hcl) .Marland Kitchen.. 1 Tab Once Daily  Allergies (verified): 1)  ! Metformin Hcl (Metformin Hcl)  Past History:  Past Medical History: Last updated: 05/05/2009 Hypertension Smoker Edema Leukopenia Hyperglycemia LE Varicosities Hyperuricemia E.D. PE DVT  Review of Systems       no loss of bowel or bladder function.  he says he has intermittent "gout pain," at the fingers.  Physical Exam  General:  obese.  no distress  Mouth:  a left upper molar is fractured at the gum line.  there is slight erythema, but no drainage.  Msk:  muscle bulk and strength are grossly normal.  no obvious joint swelling.  gait is normal and steady. no spinal tenderness.   Neurologic:  sensation is intact to touch on the feet.   Impression & Recommendations:  Problem # 1:  BACK PAIN, LUMBAR (ICD-724.2) uncertain etiology  Problem # 2:  dental pain related to fx tooth, but exact problem is uncertain  Problem # 3:  GOUT (ICD-274.9) Assessment: Unchanged  Medications Added to Medication List This Visit: 1)  Amoxicillin 500 Mg Tabs (Amoxicillin) .Marland Kitchen.. 1 tab three times a day 2)  Cialis 20 Mg Tabs (Tadalafil) .Marland KitchenMarland KitchenMarland Kitchen  For as needed use  Other Orders: TLB-BMP (Basic Metabolic Panel-BMET) (80048-METABOL) TLB-Uric Acid, Blood (84550-URIC) TLB-Lipid Panel (80061-LIPID) TLB-Sedimentation Rate (ESR) (85652-ESR) TLB-A1C / Hgb A1C (Glycohemoglobin) (83036-A1C) Radiology Referral (Radiology) Est. Patient Level IV ([address])  Patient Instructions: 1)  blood tests are being ordered for you today.  please call [phone] to hear your test results. 2)  check mri of the back.  you will be called with a day and time for an appointment. 3)  i sent a prescription for amoxicillin to [facility].  you need to see the dentist asap. 4)  Please schedule a physical appointment in 6 months. Prescriptions: CIALIS 20 MG TABS (TADALAFIL)  for as needed use  #5 x 11   Entered and Authorized by:   Minus Breeding MD   Signed by:   Minus Breeding MD on 01/14/2011   Method used:   Electronically to        [facility] [address] (retail)       [address]       [place], [place]  [address]       Ph: [phone] or [phone]       Fax: [phone]   RxID:   [id number] CIALIS 5 MG TABS (TADALAFIL) 1 tab once daily  #30 x 11   Entered and Authorized by:   Minus Breeding MD   Signed by:   Minus Breeding MD on 01/14/2011   Method used:   Print then Give to Patient   RxID:   [id number] BENICAR 40 MG TABS (OLMESARTAN MEDOXOMIL) 1 qd  #30 x 11   Entered and Authorized by:   Minus Breeding MD   Signed by:   Minus Breeding MD on 01/14/2011   Method used:   Electronically to        [facility] [address] (retail)       [address]       [place], [place]  [address]       Ph: [phone] or [phone]       Fax: [phone]   RxID:   [id number] AMOXICILLIN 500 MG TABS (AMOXICILLIN) 1 tab three times a day  #21 x 0   Entered and Authorized by:   Minus Breeding MD   Signed by:   Minus Breeding MD on 01/14/2011   Method used:   Electronically to        [facility] [address] (retail)       [address]       [place], [place]  [address]       Ph: [phone] or [phone]       Fax: [phone]   RxID:   [id number]    Orders Added: 1)  TLB-BMP (Basic Metabolic Panel-BMET) [80048-METABOL] 2)  TLB-Uric Acid, Blood [84550-URIC] 3)  TLB-Lipid Panel [80061-LIPID] 4)  TLB-Sedimentation Rate (ESR) [85652-ESR] 5)  TLB-A1C / Hgb A1C (Glycohemoglobin) [83036-A1C] 6)  Radiology Referral [Radiology] 7)  Est. Patient Level IV [[address]]

## 2011-01-30 NOTE — Medication Information (Signed)
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 no    Recent/future dental: no  Any missed doses?: no       Is patient compliant with meds? yes       Allergies: 1)  ! Metformin Hcl (Metformin Hcl)  Anticoagulation Management History:      The patient is taking warfarin and comes in today for a routine follow up visit.  Positive risk factors for bleeding include presence of serious comorbidities.  Negative risk factors for bleeding include an age less than 2 years old.  The bleeding index is 'intermediate risk'.  Positive CHADS2 values include History of HTN and History of Diabetes.  Negative CHADS2 values include Age > 45 years old.  The start date was 05/01/2009.  His last INR was 2.9 ratio.  Anticoagulation responsible provider: Shirlee Latch MD, Dalton.  INR POC: 2.2.  Cuvette Lot#: [id number].  Exp: 09/2011.    Anticoagulation Management Assessment/Plan:      The patient's current anticoagulation dose is Warfarin sodium 5 mg tabs: Take as directed by coumadin clinic..  The target INR is 2 - 3.  The next INR is due 10/10/2010.  Anticoagulation instructions were given to patient.  Results were reviewed/authorized by Bethena Midget, RN, BSN.  He was notified by Bethena Midget, RN, BSN.         Prior Anticoagulation Instructions: INR 1.6  Take 2 tablets today then start taing 1.5 tablets daily except 1 tablet on Mondays and Fridays.  Recheck in 2 weeks.    Current Anticoagulation Instructions: INR 2.2 Continue 7.5mg s daily except 5mg s on Mondays and Fridays.  Recheck in 4 weeks.  Prescriptions: WARFARIN SODIUM 5 MG TABS (WARFARIN SODIUM) Take as directed by coumadin clinic.  #45 x 2   Entered by:   Bethena Midget, RN, BSN   Authorized by:   Marca Ancona, MD   Signed by:   Bethena Midget, RN, BSN on 09/12/2010   Method used:   Electronically to        [facility] [phone]* (retail)       [address]       [place], [place]  [address]       Ph: [phone]       Fax: [phone]   RxID:   [id number]

## 2011-01-30 NOTE — Letter (Signed)
Summary: Work Excuse  Coumadin  [address]   [place], [place] [address]   Phone: [phone]  Fax: [phone]    Today's Date: June 20, 2010  Name of Patient: Spencer English  The above named patient had a medical visit today   Please take this into consideration when reviewing the time away from work    Special Instructions:  [  ] None  [  ] To be off the remainder of today, returning to the normal work / school schedule tomorrow.  [  ] To be off until the next scheduled appointment on ______________________.  [  ] Other ________________________________________________________________ ________________________________________________________________________   Sincerely yours,   Bethena Midget, RN, BSN

## 2011-01-30 NOTE — Medication Information (Signed)
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 yes       Details: Penicillin 500mg s TID for 14 day. Started steriod pill last wednesday takes TID for 14 days.   Recent/future dental: no  Any missed doses?: yes     Details: thinks he missed a dose last week, not sure.   Is patient compliant with meds? yes      Comments: Pt. will call with names of new meds.   Allergies: 1)  ! Metformin Hcl (Metformin Hcl)  Anticoagulation Management History:      The patient is taking warfarin and comes in today for a routine follow up visit.  Positive risk factors for bleeding include presence of serious comorbidities.  Negative risk factors for bleeding include an age less than 55 years old.  The bleeding index is 'intermediate risk'.  Positive CHADS2 values include History of HTN and History of Diabetes.  Negative CHADS2 values include Age > 17 years old.  The start date was 05/01/2009.  His last INR was 2.9 ratio.  Anticoagulation responsible provider: Eden Emms MD, Theron Arista.  INR POC: 2.1.  Cuvette Lot#: [id number].  Exp: 04/2011.    Anticoagulation Management Assessment/Plan:      The patient's current anticoagulation dose is Warfarin sodium 5 mg tabs: Take as directed by coumadin clinic..  The target INR is 2 - 3.  The next INR is due 04/04/2010.  Anticoagulation instructions were given to patient.  Results were reviewed/authorized by Bethena Midget, RN, BSN.  He was notified by Bethena Midget, RN, BSN.         Prior Anticoagulation Instructions: INR 1.2 Today take extra  5mg s( 1 pill)  and Friday take 7.5mg s ( 1.5 pills) then resume  7.5mg  everyday (1.5 pills) except 5mg s (1 pill) on Mondays and Fridays. Recheck in one week  Current Anticoagulation Instructions: INR 2.1 Continue 1.5 pills everyday except 1 pill on Mondays and Fridays. Recheck in 4 weeks.

## 2011-01-30 NOTE — Medication Information (Signed)
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 no    Recent/future dental: no  Any missed doses?: yes     Details: Missed a dose maybe on Monday, pt states he's not sure.   Is patient compliant with meds? yes      Comments: Pharmacy only gave per 10 pills thus, he took 1 tab on some day that should have been 1.5 tabs  Allergies: 1)  ! Metformin Hcl (Metformin Hcl)  Anticoagulation Management History:      The patient is taking warfarin and comes in today for a routine follow up visit.  Positive risk factors for bleeding include presence of serious comorbidities.  Negative risk factors for bleeding include an age less than 38 years old.  The bleeding index is 'intermediate risk'.  Positive CHADS2 values include History of HTN and History of Diabetes.  Negative CHADS2 values include Age > 54 years old.  The start date was 05/01/2009.  His last INR was 2.9 ratio.  Anticoagulation responsible provider: Tonio Seider MD, Fayrene Fearing.  INR POC: 1.5.  Cuvette Lot#: [id number].  Exp: 08/2011.    Anticoagulation Management Assessment/Plan:      The patient's current anticoagulation dose is Warfarin sodium 5 mg tabs: Take as directed by coumadin clinic..  The target INR is 2 - 3.  The next INR is due 07/04/2010.  Anticoagulation instructions were given to patient.  Results were reviewed/authorized by Bethena Midget, RN, BSN.  He was notified by Bethena Midget, RN, BSN.         Prior Anticoagulation Instructions: INR 2.3. Take 1.5 tablets daily except 1 tablet Mon and  Fri.  Current Anticoagulation Instructions: INR 1.5 Today take 2 pills and on Friday take 1 1/2 pills then resume 1 1/2 pills everyday except 1 pill on Mondays and Fridays. Recheck in 2 weeks.  Sample # 20 pills coumadin 5mg s Lot# [id number] Exp 12/2011

## 2011-01-30 NOTE — Medication Information (Signed)
Summary: ccr   Anticoagulant Therapy  Managed by: Louann Sjogren, PharmD PCP: Minus Breeding MD Supervising MD: Graciela Husbands MD, Viviann Spare Indication 1: Pulmonary Embolism and Infarction (ICD-415.1) Indication 2: Deep Vein Thrombosis - Leg (ICD-451.1) Lab Used: [facility] Site: [facility] INR RANGE 2 - 3  Dietary changes: no    Health status changes: no    Bleeding/hemorrhagic complications: no    Recent/future hospitalizations: no    Any changes in medication regimen? no    Recent/future dental: no  Any missed doses?: yes     Details: Missed a dose on Monday and took extra tablets the follow day.  Is patient compliant with meds? yes       Allergies: 1)  ! Metformin Hcl (Metformin Hcl)  Anticoagulation Management History:      The patient is taking warfarin and comes in today for a routine follow up visit.  Positive risk factors for bleeding include presence of serious comorbidities.  Negative risk factors for bleeding include an age less than 53 years old.  The bleeding index is 'intermediate risk'.  Positive CHADS2 values include History of HTN and History of Diabetes.  Negative CHADS2 values include Age > 4 years old.  The start date was 05/01/2009.  His last INR was 2.9 ratio and today's INR is 3.5.  Anticoagulation responsible provider: Graciela Husbands MD, Viviann Spare.  Exp: 09/2011.    Anticoagulation Management Assessment/Plan:      The patient's current anticoagulation dose is Warfarin sodium 5 mg tabs: Take as directed by coumadin clinic..  The target INR is 2 - 3.  The next INR is due 01/23/2011.  Anticoagulation instructions were given to patient.  Results were reviewed/authorized by Louann Sjogren, PharmD.         Prior Anticoagulation Instructions: INR 1.5    Take 2 tables today and tomorrow, then resume normal Coumadin schedule of 1.5 tablets every day of the week, except 1 tablet on Monday and Friday.  Return to clinic in 2 weeks.   Current Anticoagulation Instructions: INR  3.5 (goal 2-3)  Take only 1 tablet today (01/08/2011) then continue taking 1 1/2 tablets everyday except take 1 tablet on Mondays and Fridays. Prescriptions: WARFARIN SODIUM 5 MG TABS (WARFARIN SODIUM) Take as directed by coumadin clinic.  #45 x 0   Entered by:   Weston Brass PharmD   Authorized by:   Nathen May, MD, [facility]   Signed by:   Weston Brass PharmD on 01/08/2011   Method used:   Electronically to        [facility] (retail)       [address]       [place], [place]  [address]       Ph: [phone] or [phone]       Fax: [phone]   RxID:   [id number]

## 2011-01-30 NOTE — Progress Notes (Signed)
Summary: needs appt  Phone Note Other Incoming   Caller: dr Mina Marble Summary of Call: was seen today for acute gouty episode.  nees f/u soon  Follow-up for Phone Call        please call patient: needs ov here tomorrow or next week, to f/u gout Follow-up by: Minus Breeding MD,  August 29, 2010 9:54 AM  Additional Follow-up for Phone Call Additional follow up Details #1::        left message on voicemail to call back to office. Additional Follow-up by: Lucious Groves CMA,  August 29, 2010 10:22 AM    Additional Follow-up for Phone Call Additional follow up Details #2::    Pt is not longer employed at work number listed and home number was answered by a male who said that pt does not live at that number and requested we stop calling his home. Phone note closed until further contact from pt. Follow-up by: Margaret Pyle, CMA,  August 29, 2010 4:43 PM

## 2011-01-30 NOTE — Progress Notes (Signed)
Summary: refill meds   Phone Note Refill Request Call back at [phone] Message from:  Patient on January 01, 2011 2:21 PM  Refills Requested: Medication #1:  WARFARIN SODIUM 5 MG TABS Take as directed by coumadin clinic. [facility] [address].    Method Requested: Fax to Local Pharmacy Initial call taken by: Lorne Skeens,  January 01, 2011 2:22 PM    Prescriptions: WARFARIN SODIUM 5 MG TABS (WARFARIN SODIUM) Take as directed by coumadin clinic.  #10 x 0   Entered by:   Bethena Midget, RN, BSN   Authorized by:   Rollene Rotunda, MD, [facility]   Signed by:   Bethena Midget, RN, BSN on 01/01/2011   Method used:   Electronically to        [facility] [address] (retail)       [address]       [place], [place]  [address]       Ph: [phone] or [phone]       Fax: [phone]   RxID:   [id number]

## 2011-01-30 NOTE — Medication Information (Signed)
Summary: rov/tm  Anticoagulant Therapy  Managed by: Cloyde Reams, RN, BSN PCP: Minus Breeding MD Supervising MD: Ladona Ridgel MD, Sharlot Gowda Indication 1: Pulmonary Embolism and Infarction (ICD-415.1) Indication 2: Deep Vein Thrombosis - Leg (ICD-451.1) Lab Used: [facility] Site: [facility] INR POC 1.6 INR RANGE 2 - 3  Dietary changes: no    Health status changes: no    Bleeding/hemorrhagic complications: no    Recent/future hospitalizations: no    Any changes in medication regimen? no    Recent/future dental: no  Any missed doses?: no       Is patient compliant with meds? yes      Comments: Pt states he has been taking 1/1.5 tablets alternating. Pt going OOT 7/18 wishes to schedule appt 07/25/10.  Allergies: 1)  ! Metformin Hcl (Metformin Hcl)  Anticoagulation Management History:      The patient is taking warfarin and comes in today for a routine follow up visit.  Positive risk factors for bleeding include presence of serious comorbidities.  Negative risk factors for bleeding include an age less than 2 years old.  The bleeding index is 'intermediate risk'.  Positive CHADS2 values include History of HTN and History of Diabetes.  Negative CHADS2 values include Age > 52 years old.  The start date was 05/01/2009.  His last INR was 2.9 ratio.  Anticoagulation responsible provider: Ladona Ridgel MD, Sharlot Gowda.  INR POC: 1.6.  Cuvette Lot#: [id number].  Exp: 08/2011.    Anticoagulation Management Assessment/Plan:      The patient's current anticoagulation dose is Warfarin sodium 5 mg tabs: Take as directed by coumadin clinic..  The target INR is 2 - 3.  The next INR is due 07/25/2010.  Anticoagulation instructions were given to patient.  Results were reviewed/authorized by Cloyde Reams, RN, BSN.  He was notified by Cloyde Reams RN.         Prior Anticoagulation Instructions: INR 1.5 Today take 2 pills and on Friday take 1 1/2 pills then resume 1 1/2 pills everyday except 1 pill on Mondays and  Fridays. Recheck in 2 weeks.  Sample # 20 pills coumadin 5mg s Lot# [id number] Exp 12/2011  Current Anticoagulation Instructions: INR 1.6  Take 2 tablets today then start taing 1.5 tablets daily except 1 tablet on Mondays and Fridays.  Recheck in 2 weeks.

## 2011-01-30 NOTE — Letter (Signed)
Summary: Call A Nurse  Call A Nurse   Imported By: Sherian Rein 08/06/2010 07:40:55  _____________________________________________________________________  External Attachment:    Type:   Image     Comment:   External Document

## 2011-01-30 NOTE — Medication Information (Signed)
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 no    Recent/future dental: no  Any missed doses?: yes     Details: missed last night dose and maybe one over the weekend  Is patient compliant with meds? yes       Allergies: 1)  ! Metformin Hcl (Metformin Hcl)  Anticoagulation Management History:      The patient is taking warfarin and comes in today for a routine follow up visit.  Positive risk factors for bleeding include presence of serious comorbidities.  Negative risk factors for bleeding include an age less than 62 years old.  The bleeding index is 'intermediate risk'.  Positive CHADS2 values include History of HTN and History of Diabetes.  Negative CHADS2 values include Age > 37 years old.  The start date was 05/01/2009.  His last INR was 2.9 ratio.  Anticoagulation responsible provider: Graciela Husbands MD, Viviann Spare.  INR POC: 1.2.  Cuvette Lot#: [id number].  Exp: 01/2011.    Anticoagulation Management Assessment/Plan:      The patient's current anticoagulation dose is Warfarin sodium 5 mg tabs: Take as directed by coumadin clinic..  The target INR is 2 - 3.  The next INR is due 01/31/2010.  Anticoagulation instructions were given to patient.  Results were reviewed/authorized by Bethena Midget, RN, BSN.  He was notified by Bethena Midget, RN, BSN.         Prior Anticoagulation Instructions: The patient is to continue with the same dose of coumadin.  This dosage includes:   Current Anticoagulation Instructions: INR 1.2 Today take extra 5mg s( 1 pill)  and Friday  take 7.5mg s ( 1.5 pills) then resume  7.5mg  everyday (1.5 pills) except 5mg s (1 pill) on Mondays and Fridays. Recheck in one week

## 2011-01-30 NOTE — Assessment & Plan Note (Signed)
Summary: 6 WK PHYSICAL  STC   Vital Signs:  Patient profile:   45 year old male Height:      72 inches (182.88 cm) Weight:      264.50 pounds (120.23 kg) BMI:     36.00 O2 Sat:      97 % on Room air Temp:     98.6 degrees F (37.00 degrees C) oral Pulse rate:   88 / minute BP sitting:   138 / 92  (left arm) Cuff size:   large  Vitals Entered By: Brenton Grills MA (July 04, 2010 3:53 PM)  O2 Flow:  Room air CC: 6 wk physical/requesting samples of cialis, Alma Friendly, benicar/aj   Primary Provider:  Minus Breeding MD  CC:  6 wk physical/requesting samples of cialis, januvia, and benicar/aj.  History of Present Illness: here for regular wellness examination.  He's feeling pretty well in general, and does not smoke.  alcohol is rare.   Current Medications (verified): 1)  Cialis 2.5 Mg  Tabs (Tadalafil) .... Qd 2)  Allegra 180 Mg  Tabs (Fexofenadine Hcl) .Marland Kitchen.. 1 By Mouth Qd 3)  Flonase 50 Mcg/act  Susp (Fluticasone Propionate) .... 2 Sprays Ea Side Qd 4)  Januvia 100 Mg  Tabs (Sitagliptin Phosphate) .... Take 1 By Mouth Qd 5)  Furosemide 40 Mg  Tabs (Furosemide) .... Take 1 By Mouth Once Daily Need Physical Appt No Addtional Refills Until Appt 6)  Hydrocodone-Acetaminophen 10-325 Mg Tabs (Hydrocodone-Acetaminophen) .Marland Kitchen.. 1 Q4h As Needed Pain 7)  Ted Hose To Knees .Marland Kitchen.. 415.19 8)  Warfarin Sodium 5 Mg Tabs (Warfarin Sodium) .... Take As Directed By Coumadin Clinic. 9)  Triamcinolone Acetonide 0.025 % Crea (Triamcinolone Acetonide) .... Three Times A Day As Needed Itching 10)  Ultravate 0.05 % Oint (Halobetasol Propionate) .... Three Times A Day As Needed Itching 11)  Simvastatin 40 Mg Tabs (Simvastatin) .Marland Kitchen.. 1 Qd 12)  Benicar 40 Mg Tabs (Olmesartan Medoxomil) .Marland Kitchen.. 1 Qd 13)  Verapamil Hcl Cr 180 Mg Cr-Tabs (Verapamil Hcl) .Marland Kitchen.. 1 Once Daily 14)  Halobetasol Propionate 0.05 % Crea (Halobetasol Propionate) .... Three Times A Day As Needed For Rash  Allergies (verified): 1)  ! Metformin  Hcl (Metformin Hcl)  Family History: Reviewed history from 04/26/2009 and no changes required. no premature CAD known  Social History: Reviewed history from 05/23/2010 and no changes required. married  nonsmoker  trucker  Review of Systems  The patient denies fever, weight loss, weight gain, vision loss, decreased hearing, syncope, dyspnea on exertion, prolonged cough, headaches, melena, severe indigestion/heartburn, hematuria, and depression.    Physical Exam  General:  obese.  no distress  Head:  head: no deformity eyes: no periorbital swelling, no proptosis external nose and ears are normal mouth: no lesion seen Neck:  Supple without thyroid enlargement or tenderness.  Heart:  Regular rate and rhythm without murmurs or gallops noted. Normal S1,S2.   Rectal:  (was done at er 5 days ago) Genitalia:  (was done at er 5 d ago) Prostate:  was done at er 5d ago.   Msk:  muscle bulk and strength are grossly normal.  no obvious joint swelling.  gait is normal and steady there is a surgical scan at the right knee (tkr) Pulses:  dorsalis pedis intact bilat.  no carotid bruit Extremities:  no deformity.  no ulcer on the feet.  feet are of normal color and temp.   there are bilateral varicosities. trace right pedal edema, trace left pedal edema,  and mycotic toenails.   Neurologic:  cn 2-12 grossly intact.   readily moves all 4's.   sensation is intact to touch on the feet  Skin:  normal texture and temp.  no rash.  not diaphoretic  Cervical Nodes:  No significant adenopathy.  Psych:  Alert and cooperative; normal mood and affect; normal attention span and concentration.   Additional Exam:  SEPARATE EVALUATION FOLLOWS--EACH PROBLEM HERE IS NEW, NOT RESPONDING TO TREATMENT, OR POSES SIGNIFICANT RISK TO THE PATIENT'S HEALTH: HISTORY OF THE PRESENT ILLNESS: pt states 1 month of moderate pain at the left flank.  no assoc brbpr. he is on no med for hyperuricemia. he takes bp meds and  zocor for htn and dyslipidemia, respectively. PAST MEDICAL HISTORY reviewed and up to date today REVIEW OF SYSTEMS: denies hematuria and chest pain PHYSICAL EXAMINATION: abdomen is soft, nontender.  no hepatosplenomegaly.   not distended.  no hernia clear to auscultation.  no respiratory distress see vs page LAB/XRAY RESULTS: Uric Acid                 7.7 mg/dL   LDL Cholesterol      [H]  604 mg/dL   IMPRESSION: htn, poss exacerbated by pain hyperuricemia, needs increased rx flank pain, uncertain etiology.  uncertain if related to h/o urolithiasis dyslipidemia, needs increased rx PLAN: see instruction sheet   Impression & Recommendations:  Problem # 1:  ROUTINE GENERAL MEDICAL EXAM@HEALTH  CARE FACL (ICD-V70.0)  Medications Added to Medication List This Visit: 1)  Crestor 40 Mg Tabs (Rosuvastatin calcium) .Marland Kitchen.. 1 once daily 2)  Allopurinol 100 Mg Tabs (Allopurinol) .Marland Kitchen.. 1 once daily  Other Orders: EKG w/ Interpretation (93000) Radiology Referral (Radiology) TLB-Lipid Panel (80061-LIPID) TLB-BMP (Basic Metabolic Panel-BMET) (80048-METABOL) TLB-CBC Platelet - w/Differential (85025-CBCD) TLB-Hepatic/Liver Function Pnl (80076-HEPATIC) TLB-TSH (Thyroid Stimulating Hormone) (84443-TSH) TLB-Uric Acid, Blood (84550-URIC) TLB-A1C / Hgb A1C (Glycohemoglobin) (83036-A1C) TLB-PSA (Prostate Specific Antigen) (84153-PSA) TLB-Microalbumin/Creat Ratio, Urine (82043-MALB) TLB-Udip w/ Micro (81001-URINE) TLB-BNP (B-Natriuretic Peptide) (83880-BNPR) Est. Patient Level IV (54098) Est. Patient 40-64 years ([address])  Patient Instructions: 1)  check ct scan.  you will be called with a day and time for an appointment. 2)  same blood pressure meds for now. 3)  please consider these measures for your health:  minimize alcohol.  do not use tobacco products.  have a colonoscopy at least every 10 years from age 73.  keep firearms safely stored.  always use seat belts.  have working smoke alarms in  your home.  see the dentist regularly.  never drive under the influence of alcohol or drugs (including prescription drugs).  4)  blood tests are being ordered for you today.  please call [phone] to hear your test results. 5)  (update: i left message on phone-tree:  change zocor to crestor 40 mg once daily.  start allopurinol 100 mg once daily.  ret 3 mos). Prescriptions: ALLOPURINOL 100 MG TABS (ALLOPURINOL) 1 once daily  #30 x 11   Entered and Authorized by:   Minus Breeding MD   Signed by:   Minus Breeding MD on 07/05/2010   Method used:   Electronically to        [facility] [address] (retail)       [address]       [place], [place]  [address]       Ph: [phone] or [phone]       Fax: [phone]  RxID:   [id number] CRESTOR 40 MG TABS (ROSUVASTATIN CALCIUM) 1 once daily  #30 x 11   Entered and Authorized by:   Minus Breeding MD   Signed by:   Minus Breeding MD on 07/05/2010   Method used:   Electronically to        [facility] [address] (retail)       [address]       [place], [place]  [address]       Ph: [phone] or [phone]       Fax: [phone]   RxID:   [id number]

## 2011-01-30 NOTE — Assessment & Plan Note (Signed)
Summary: FU---STC   Vital Signs:  Patient profile:   45 year old male Height:      72 inches (182.88 cm) Weight:      257.25 pounds (116.93 kg) BMI:     35.02 O2 Sat:      97 % on Room air Temp:     97.2 degrees F (36.22 degrees C) oral Pulse rate:   95 / minute BP sitting:   122 / 82  (left arm) Cuff size:   large  Vitals Entered By: Josph Macho RMA (May 23, 2010 2:48 PM)  O2 Flow:  Room air CC: Follow-up visit/ pt would like samples/ CF Is Patient Diabetic? Yes   Primary Provider:  Minus Breeding MD  CC:  Follow-up visit/ pt would like samples/ CF.  History of Present Illness: the status of at least 3 ongoing medical problems is addressed today: htn:  pt says he increased benicar to 40 mg two times a day, because 40 once daily did not control his bp.   edema:  it persists, but he hesitates to increase the lasix.  he can't take hctz, due to hyperuricemia. eczema:  itching on the back of both hands is worse--tac does not help much.  Current Medications (verified): 1)  Cialis 2.5 Mg  Tabs (Tadalafil) .... Qd 2)  Allegra 180 Mg  Tabs (Fexofenadine Hcl) .Marland Kitchen.. 1 By Mouth Qd 3)  Flonase 50 Mcg/act  Susp (Fluticasone Propionate) .... 2 Sprays Ea Side Qd 4)  Januvia 100 Mg  Tabs (Sitagliptin Phosphate) .... Take 1 By Mouth Qd 5)  Furosemide 40 Mg  Tabs (Furosemide) .... Take 1 By Mouth Once Daily Need Physical Appt No Addtional Refills Until Appt 6)  Hydrocodone-Acetaminophen 10-325 Mg Tabs (Hydrocodone-Acetaminophen) .Marland Kitchen.. 1 Q4h As Needed Pain 7)  Ted Hose To Knees .Marland Kitchen.. 415.19 8)  Warfarin Sodium 5 Mg Tabs (Warfarin Sodium) .... Take As Directed By Coumadin Clinic. 9)  Triamcinolone Acetonide 0.025 % Crea (Triamcinolone Acetonide) .... Three Times A Day As Needed Itching 10)  Ultravate 0.05 % Oint (Halobetasol Propionate) .... Three Times A Day As Needed Itching 11)  Verapamil Hcl Cr 120 Mg Cr-Tabs (Verapamil Hcl) .Marland Kitchen.. 1 Qd 12)  Simvastatin 40 Mg Tabs (Simvastatin) .Marland Kitchen.. 1  Qd 13)  Benicar 40 Mg Tabs (Olmesartan Medoxomil) .Marland Kitchen.. 1 Qd  Allergies (verified): 1)  ! Metformin Hcl (Metformin Hcl)  Past History:  Past Medical History: Last updated: 05/05/2009 Hypertension Smoker Edema Leukopenia Hyperglycemia LE Varicosities Hyperuricemia E.D. PE DVT  Social History: Reviewed history from 05/05/2009 and no changes required. married  nonsmoker  trucker  Review of Systems  The patient denies chest pain and dyspnea on exertion.    Physical Exam  General:  normal appearance.   Extremities:  1+ right pedal edema and 1+ left pedal edema.   Skin:  moderate eczematous rash at the dorsal aspect of the right hand.   Impression & Recommendations:  Problem # 1:  HYPERTENSION (ICD-401.9) needs increased rx  Problem # 2:  EDEMA (ICD-782.3) persistent.  pt declines increase of lasix  Problem # 3:  ECZEMA (ICD-692.9) recurrent  Medications Added to Medication List This Visit: 1)  Verapamil Hcl Cr 180 Mg Cr-tabs (Verapamil hcl) .Marland Kitchen.. 1 once daily 2)  Halobetasol Propionate 0.05 % Crea (Halobetasol propionate) .... Three times a day as needed for rash  Other Orders: Est. Patient Level IV (04540)  Patient Instructions: 1)  continue benicar at 40 mg once daily. 2)  increase verapamil to 180  mg once daily. 3)  take halobetasol cream three times a day as needed for rash. 4)  Please schedule a physical appointment in 6 weeks. Prescriptions: ALLEGRA 180 MG  TABS (FEXOFENADINE HCL) 1 by mouth qd  #30 Each x 11   Entered and Authorized by:   Minus Breeding MD   Signed by:   Minus Breeding MD on 05/23/2010   Method used:   Electronically to        [facility] [phone]* (retail)       [address]       [place], [place]  [address]       Ph: [phone]       Fax: [phone]   RxID:   [id number] HALOBETASOL PROPIONATE 0.05 % CREA (HALOBETASOL PROPIONATE) three times a day as needed for rash  #1 med tube x 1   Entered and Authorized by:    Minus Breeding MD   Signed by:   Minus Breeding MD on 05/23/2010   Method used:   Electronically to        [facility] [phone]* (retail)       [address]       [place], [place]  [address]       Ph: [phone]       Fax: [phone]   RxID:   [id number] VERAPAMIL HCL CR 180 MG CR-TABS (VERAPAMIL HCL) 1 once daily  #30 x 11   Entered and Authorized by:   Minus Breeding MD   Signed by:   Minus Breeding MD on 05/23/2010   Method used:   Electronically to        [facility] [phone]* (retail)       [address]       [place], [place]  [address]       Ph: [phone]       Fax: [phone]   RxID:   [id number]

## 2011-01-31 ENCOUNTER — Ambulatory Visit (INDEPENDENT_AMBULATORY_CARE_PROVIDER_SITE_OTHER): Payer: Self-pay | Admitting: Internal Medicine

## 2011-01-31 ENCOUNTER — Encounter: Payer: Self-pay | Admitting: Internal Medicine

## 2011-01-31 DIAGNOSIS — R0600 Dyspnea, unspecified: Secondary | ICD-10-CM | POA: Insufficient documentation

## 2011-01-31 DIAGNOSIS — I10 Essential (primary) hypertension: Secondary | ICD-10-CM

## 2011-01-31 DIAGNOSIS — J209 Acute bronchitis, unspecified: Secondary | ICD-10-CM

## 2011-01-31 DIAGNOSIS — R062 Wheezing: Secondary | ICD-10-CM

## 2011-01-31 DIAGNOSIS — E119 Type 2 diabetes mellitus without complications: Secondary | ICD-10-CM

## 2011-02-10 ENCOUNTER — Encounter: Payer: Self-pay | Admitting: Endocrinology

## 2011-02-10 ENCOUNTER — Ambulatory Visit: Payer: Self-pay | Admitting: Endocrinology

## 2011-02-10 ENCOUNTER — Ambulatory Visit (INDEPENDENT_AMBULATORY_CARE_PROVIDER_SITE_OTHER): Payer: Self-pay | Admitting: Endocrinology

## 2011-02-10 ENCOUNTER — Encounter (INDEPENDENT_AMBULATORY_CARE_PROVIDER_SITE_OTHER): Payer: Self-pay

## 2011-02-10 ENCOUNTER — Other Ambulatory Visit: Payer: Self-pay

## 2011-02-10 ENCOUNTER — Other Ambulatory Visit: Payer: Self-pay | Admitting: Endocrinology

## 2011-02-10 ENCOUNTER — Encounter: Payer: Self-pay | Admitting: Cardiology

## 2011-02-10 ENCOUNTER — Encounter (INDEPENDENT_AMBULATORY_CARE_PROVIDER_SITE_OTHER): Payer: Self-pay | Admitting: *Deleted

## 2011-02-10 DIAGNOSIS — I2699 Other pulmonary embolism without acute cor pulmonale: Secondary | ICD-10-CM

## 2011-02-10 DIAGNOSIS — Z79899 Other long term (current) drug therapy: Secondary | ICD-10-CM

## 2011-02-10 DIAGNOSIS — E119 Type 2 diabetes mellitus without complications: Secondary | ICD-10-CM

## 2011-02-10 DIAGNOSIS — I8010 Phlebitis and thrombophlebitis of unspecified femoral vein: Secondary | ICD-10-CM

## 2011-02-10 LAB — HEPATIC FUNCTION PANEL
ALT: 24 U/L (ref 0–53)
Total Protein: 7.1 g/dL (ref 6.0–8.3)

## 2011-02-10 LAB — CONVERTED CEMR LAB: POC INR: 2.5

## 2011-02-13 NOTE — Assessment & Plan Note (Signed)
Summary: Chest congestion/sae/   Vital Signs:  Patient profile:   45 year old male Height:      72 inches Weight:      263.50 pounds BMI:     35.87 O2 Sat:      95 % on Room air Temp:     99.4 degrees F oral Pulse rate:   101 / minute BP sitting:   144 / 102  (left arm) Cuff size:   large  Vitals Entered By: Margaret Pyle, CMA (January 31, 2011 4:36 PM)  O2 Flow:  Room air CC: Congestion, HA, ST, fever, body pain and sinus drainage x 2 days   Primary Care Provider:  Minus Breeding MD  CC:  Congestion, HA, ST, fever, and body pain and sinus drainage x 2 days.  History of Present Illness: here with acute onset 3 days fever, mild ST, HA, general weakness and malaise,  and today increased mild wheezing, chest tightness and sob.  Pt denies CP, worsening sob, doe, wheezing, orthopnea, pnd, worsening LE edema, palps, dizziness or syncope  Pt denies new neuro symptoms such as headache, facial or extremity weakness  Pt denies polydipsia, polyuria, or low sugar symptoms such as shakiness improved with eating.  Overall good compliance with meds, trying to follow low chol, DM diet, wt stable, little excercise however  CBG;s in low 100's.  No recent wt loss, night sweats, loss of appetite or other constitutional symptoms .  Overall good compliance with meds, and good tolerability.   Preventive Screening-Counseling & Management      Drug Use:  no.    Problems Prior to Update: 1)  Wheezing  (ICD-786.07) 2)  Bronchitis-acute  (ICD-466.0) 3)  Back Pain, Lumbar  (ICD-724.2) 4)  Gout  (ICD-274.9) 5)  Routine General Medical Exam@[facility]  (ICD-V70.0) 6)  Unspecified Urinary Calculus  (ICD-592.9) 7)  Encounter For Long-term Use of Other Medications  (ICD-V58.69) 8)  Special Screening Malignant Neoplasm of Prostate  (ICD-V76.44) 9)  Eczema  (ICD-692.9) 10)  Edema  (ICD-782.3) 11)  Hyperuricemia  (ICD-790.6) 12)  Ankle Pain, Left  (ICD-719.47) 13)  Deep Venous  Thrombophlebitis, Leg, Left  (ICD-453.40) 14)  Foot Pain, Left  (ICD-729.5) 15)  Pulmonary Embolism  (ICD-415.19) 16)  Encounter For Long-term Use of Anticoagulants  (ICD-V58.61) 17)  Hypertension  (ICD-401.9) 18)  Dm  (ICD-250.00) 19)  Dyslipidemia  (ICD-272.4) 20)  H/F Chest Pain Unspecified  (ICD-786.50) 21)  Ankle Pain, Right  (ICD-719.47) 22)  Allergic Rhinitis  (ICD-477.9) 23)  Cough  (ICD-786.2) 24)  Erectile Dysfunction, Organic  (ICD-607.84) 25)  Sebaceous Cyst  (ICD-706.2)  Medications Prior to Update: 1)  Allegra 180 Mg  Tabs (Fexofenadine Hcl) .Marland Kitchen.. 1 By Mouth Qd 2)  Flonase 50 Mcg/act  Susp (Fluticasone Propionate) .... 2 Sprays Ea Side Qd 3)  Januvia 100 Mg  Tabs (Sitagliptin Phosphate) .... Take 1 By Mouth Qd 4)  Furosemide 40 Mg  Tabs (Furosemide) .... Take 1 By Mouth Once Daily Need Physical Appt No Addtional Refills Until Appt 5)  Hydrocodone-Acetaminophen 10-325 Mg Tabs (Hydrocodone-Acetaminophen) .Marland Kitchen.. 1 Q4h As Needed Pain 6)  Ted Hose To Knees .Marland Kitchen.. 415.19 7)  Warfarin Sodium 5 Mg Tabs (Warfarin Sodium) .... Take As Directed By Coumadin Clinic. 8)  Triamcinolone Acetonide 0.025 % Crea (Triamcinolone Acetonide) .... Three Times A Day As Needed Itching 9)  Benicar 40 Mg Tabs (Olmesartan Medoxomil) .Marland Kitchen.. 1 Qd 10)  Halobetasol Propionate 0.05 % Crea (Halobetasol Propionate) .... Three Times  A Day As Needed For Rash 11)  Crestor 40 Mg Tabs (Rosuvastatin Calcium) .Marland Kitchen.. 1 Once Daily 12)  Allopurinol 300 Mg Tabs (Allopurinol) .Marland Kitchen.. 1 Tab Once Daily 13)  Verapamil Hcl Cr 240 Mg Cr-Tabs (Verapamil Hcl) .Marland Kitchen.. 1 Tab Once Daily 14)  Amoxicillin 500 Mg Tabs (Amoxicillin) .Marland Kitchen.. 1 Tab Three Times A Day 15)  Cialis 20 Mg Tabs (Tadalafil) .... For As Needed Use  Current Medications (verified): 1)  Allegra 180 Mg  Tabs (Fexofenadine Hcl) .Marland Kitchen.. 1 By Mouth Qd 2)  Flonase 50 Mcg/act  Susp (Fluticasone Propionate) .... 2 Sprays Ea Side Qd 3)  Januvia 100 Mg  Tabs (Sitagliptin Phosphate) ....  Take 1 By Mouth Qd 4)  Furosemide 40 Mg  Tabs (Furosemide) .... Take 1 By Mouth Once Daily 5)  Hydrocodone-Acetaminophen 10-325 Mg Tabs (Hydrocodone-Acetaminophen) .Marland Kitchen.. 1 Q4h As Needed Pain 6)  Ted Hose To Knees .Marland Kitchen.. 415.19 7)  Warfarin Sodium 5 Mg Tabs (Warfarin Sodium) .... Take As Directed By Coumadin Clinic. 8)  Triamcinolone Acetonide 0.025 % Crea (Triamcinolone Acetonide) .... Three Times A Day As Needed Itching 9)  Benicar 40 Mg Tabs (Olmesartan Medoxomil) .Marland Kitchen.. 1 Qd 10)  Halobetasol Propionate 0.05 % Crea (Halobetasol Propionate) .... Three Times A Day As Needed For Rash 11)  Crestor 40 Mg Tabs (Rosuvastatin Calcium) .Marland Kitchen.. 1 Once Daily 12)  Allopurinol 300 Mg Tabs (Allopurinol) .Marland Kitchen.. 1 Tab Once Daily 13)  Verapamil Hcl Cr 240 Mg Cr-Tabs (Verapamil Hcl) .Marland Kitchen.. 1 Tab Once Daily 14)  Azithromycin 250 Mg Tabs (Azithromycin) .... 2po Qd For 1 Day, Then 1po Qd For 4days, Then Stop 15)  Cialis 20 Mg Tabs (Tadalafil) .... For As Needed Use 16)  Tussionex Pennkinetic Er 10-8 Mg/69ml Lqcr (Hydrocod Polst-Chlorphen Polst) .Marland Kitchen.. 1 Tsp By Mouth Two Times A Day As Needed (Generic Ok) 17)  Prednisone 10 Mg Tabs (Prednisone) .... 3po Qd For 3days, Then 2po Qd For 3days, Then 1po Qd For 3days, Then Stop  Allergies (verified): 1)  ! Metformin Hcl (Metformin Hcl)  Past History:  Past Medical History: Last updated: 05/05/2009 Hypertension Smoker Edema Leukopenia Hyperglycemia LE Varicosities Hyperuricemia E.D. 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 , mild ill  Head:  normocephalic and  atraumatic.   Eyes:  vision grossly intact, pupils equal, and pupils round.   Ears:  bilat tm's red, sinus nontender Nose:  nasal dischargemucosal pallor and mucosal edema.   Mouth:  pharyngeal erythema and fair dentition.   Neck:  supple and no masses.   Lungs:  normal respiratory effort, R decreased breath sounds, R wheezes, L decreased breath sounds, and L wheezes.   Heart:  normal rate and regular rhythm.   Extremities:  no edema, no erythema    Impression & Recommendations:  Problem # 1:  BRONCHITIS-ACUTE (ICD-466.0)  His updated medication list for this problem includes:    Azithromycin 250 Mg Tabs (Azithromycin) .Marland Kitchen... 2po qd for 1 day, then 1po qd for 4days, then stop    Tussionex Pennkinetic Er 10-8 Mg/8ml Lqcr (Hydrocod polst-chlorphen polst) .Marland Kitchen... 1 tsp by mouth two times a  day as needed (generic ok) treat as above, f/u any worsening signs or symptoms   Problem # 2:  WHEEZING (ICD-786.07) mild, likely due to above, tx with predpack x 1, f/u any worsening symptoms  Problem # 3:  HYPERTENSION (ICD-401.9)  His updated medication list for this problem includes:    Furosemide 40 Mg Tabs (Furosemide) .Marland Kitchen... Take 1 by mouth once daily    Benicar 40 Mg Tabs (Olmesartan medoxomil) .Marland Kitchen... 1 qd    Verapamil Hcl Cr 240 Mg Cr-tabs (Verapamil hcl) .Marland Kitchen... 1 tab once daily  BP today: 144/102 Prior BP: 134/98 (01/14/2011)  Labs Reviewed: K+: 4.5 (07/04/2010) Creat: : 1.2 (07/04/2010)   Chol: 172 (07/04/2010)   HDL: 41.30 (07/04/2010)   LDL: 108 (07/04/2010)   TG: 112.0 (07/04/2010) mild elev today, likely situational, ok to follow, continue same treatment   Problem # 4:  DM (ICD-250.00)  His updated medication list for this problem includes:    Januvia 100 Mg Tabs (Sitagliptin phosphate) .Marland Kitchen... Take 1 by mouth qd    Benicar 40 Mg Tabs (Olmesartan medoxomil) .Marland Kitchen... 1 qd  Labs Reviewed: Creat: 1.2 (07/04/2010)    Reviewed HgBA1c results: 6.6 (07/04/2010)  6.8 (09/24/2009) stable  overall by hx and exam, ok to continue meds/tx as is   Complete Medication List: 1)  Allegra 180 Mg Tabs (Fexofenadine hcl) .Marland Kitchen.. 1 by mouth qd 2)  Flonase 50 Mcg/act Susp (Fluticasone propionate) .... 2 sprays ea side qd 3)  Januvia 100 Mg Tabs (Sitagliptin phosphate) .... Take 1 by mouth qd 4)  Furosemide 40 Mg Tabs (Furosemide) .... Take 1 by mouth once daily 5)  Hydrocodone-acetaminophen 10-325 Mg Tabs (Hydrocodone-acetaminophen) .Marland Kitchen.. 1 q4h as needed pain 6)  Ted Hose To Knees  .Marland Kitchen.. 415.19 7)  Warfarin Sodium 5 Mg Tabs (Warfarin sodium) .... Take as directed by coumadin clinic. 8)  Triamcinolone Acetonide 0.025 % Crea (Triamcinolone acetonide) .... Three times a day as needed itching 9)  Benicar 40 Mg Tabs (Olmesartan medoxomil) .Marland Kitchen.. 1 qd 10)  Halobetasol Propionate 0.05 % Crea (Halobetasol propionate) .... Three times a day as needed for rash 11)  Crestor 40 Mg Tabs (Rosuvastatin calcium) .Marland Kitchen.. 1 once daily 12)  Allopurinol 300 Mg Tabs (Allopurinol) .Marland Kitchen.. 1 tab once daily 13)  Verapamil Hcl Cr 240 Mg Cr-tabs (Verapamil hcl) .Marland Kitchen.. 1 tab once daily 14)  Azithromycin 250 Mg Tabs (Azithromycin) .... 2po qd for 1 day, then 1po qd for 4days, then stop 15)  Cialis 20 Mg Tabs (Tadalafil) .... For as needed use 16)  Tussionex Pennkinetic Er 10-8 Mg/22ml Lqcr (Hydrocod polst-chlorphen polst) .Marland Kitchen.. 1 tsp by mouth two times a day as needed (generic ok) 17)  Prednisone 10 Mg Tabs (Prednisone) .... 3po qd for 3days, then 2po qd for 3days, then 1po qd for 3days, then stop  Patient Instructions: 1)  Please take all new medications as prescribed 2)  Continue all previous medications as before this visit 3)  You can also use Coricidin OTC or it's generic for congestion  4)  Please schedule an appointment with your primary doctor as needed Prescriptions: PREDNISONE 10 MG TABS (PREDNISONE) 3po qd for 3days, then 2po qd for 3days, then 1po qd for 3days, then stop  #18 x 0   Entered and Authorized by:   Corwin Levins MD   Signed by:   Corwin Levins MD on 01/31/2011   Method used:   Print then Give to Patient   RxID:   [id number] Sandria Senter ER  10-8 MG/5ML LQCR (HYDROCOD POLST-CHLORPHEN POLST) 1 tsp by mouth two times a day as needed (generic ok)  #6oz x 1   Entered and Authorized by:   Corwin Levins MD   Signed by:   Corwin Levins MD on 01/31/2011   Method used:   Print then Give to Patient   RxID:   [id number] AZITHROMYCIN 250 MG TABS (AZITHROMYCIN) 2po qd for 1 day, then 1po qd for 4days, then stop  #6 x 1   Entered and Authorized by:   Corwin Levins MD   Signed by:   Corwin Levins MD on 01/31/2011   Method used:   Print then Give to Patient   RxID:   [id number]    Orders Added: 1)  Est. Patient Level IV [84696]

## 2011-02-19 NOTE — Letter (Signed)
Summary: Out of Work  [facility]  [address] [place], [place] [address]   Phone: [phone]  Fax: [phone]    February 10, 2011   Employee:  NOLAWI KANADY    To Whom It May Concern:   For Medical reasons, please excuse the above named employee from work for a doctor appointment this morning.         Sincerely,    Minus Breeding MD

## 2011-02-19 NOTE — Medication Information (Signed)
Summary: Coumadin Clinic   Anticoagulant Therapy  Managed by: Weston Brass, PharmD PCP: Minus Breeding MD Supervising MD: Jens Som MD, Arlys John Indication 1: Pulmonary Embolism and Infarction (ICD-415.1) Indication 2: Deep Vein Thrombosis - Leg (ICD-451.1) Lab Used: [facility] Site: [facility] INR POC 2.5 INR RANGE 2 - 3  Dietary changes: no    Health status changes: no    Bleeding/hemorrhagic complications: no    Recent/future hospitalizations: no    Any changes in medication regimen? no    Recent/future dental: no  Any missed doses?: no       Is patient compliant with meds? yes       Allergies: 1)  ! Metformin Hcl (Metformin Hcl)  Anticoagulation Management History:      The patient is taking warfarin and comes in today for a routine follow up visit.  Positive risk factors for bleeding include presence of serious comorbidities.  Negative risk factors for bleeding include an age less than 54 years old.  The bleeding index is 'intermediate risk'.  Positive CHADS2 values include History of HTN and History of Diabetes.  Negative CHADS2 values include Age > 56 years old.  The start date was 05/01/2009.  His last INR was 3.5.  Anticoagulation responsible provider: Jens Som MD, Arlys John.  INR POC: 2.5.  Cuvette Lot#: [id number].  Exp: 12/2011.    Anticoagulation Management Assessment/Plan:      The patient's current anticoagulation dose is Warfarin sodium 5 mg tabs: Take as directed by coumadin clinic..  The target INR is 2 - 3.  The next INR is due 03/10/2011.  Anticoagulation instructions were given to patient.  Results were reviewed/authorized by Weston Brass, PharmD.  He was notified by Margot Chimes PharmD Candidate .         Prior Anticoagulation Instructions: INR 3.5 (goal 2-3)  Take only 1 tablet today (01/08/2011) then continue taking 1 1/2 tablets everyday except take 1 tablet on Mondays and Fridays.  Current Anticoagulation Instructions: INR: 2.5  Continue your current  dose of 1 and 1/2 tablets everydays except on Mondays and Fridays when you only take 1 tablet.  Recheck in 4 weeks.   Prescriptions: WARFARIN SODIUM 5 MG TABS (WARFARIN SODIUM) Take as directed by coumadin clinic.  #45 x 1   Entered by:   Samantha Crimes PharmD   Authorized by:   Ferman Hamming, MD, [facility]   Signed by:   Samantha Crimes PharmD on 02/10/2011   Method used:   Electronically to        [facility] [phone]* (retail)       [address]       [place], [place]  [address]       Ph: [phone]       Fax: [phone]   RxID:   [id number]

## 2011-02-19 NOTE — Assessment & Plan Note (Signed)
Summary: FU----STC   Vital Signs:  Patient profile:   45 year old male Height:      72 inches (182.88 cm) Weight:      263.13 pounds (119.60 kg) BMI:     35.82 O2 Sat:      95 % on Room air Temp:     98.0 degrees F (36.67 degrees C) oral Pulse rate:   72 / minute Pulse rhythm:   regular BP sitting:   148 / 98  (left arm) Cuff size:   large  Vitals Entered By: Brenton Grills CMA Duncan Dull) (February 10, 2011 10:58 AM)  O2 Flow:  Room air CC: Follow up visit/pt concered about SE of ATB given at last OV/aj Is Patient Diabetic? Yes   Primary Provider:  Minus Breeding MD  CC:  Follow up visit/pt concered about SE of ATB given at last OV/aj.  History of Present Illness: the status of at least 3 ongoing medical problems is addressed today: htn: he does not take benicar, due to cost dm: pt says his diet and activity are inconsistent.   dyslipidemia: pt says he is feeling better since his recent illness.  he is he wants to check his liver, due to being on the zithromax.    Current Medications (verified): 1)  Allegra 180 Mg  Tabs (Fexofenadine Hcl) .Marland Kitchen.. 1 By Mouth Qd 2)  Flonase 50 Mcg/act  Susp (Fluticasone Propionate) .... 2 Sprays Ea Side Qd 3)  Januvia 100 Mg  Tabs (Sitagliptin Phosphate) .... Take 1 By Mouth Qd 4)  Furosemide 40 Mg  Tabs (Furosemide) .... Take 1 By Mouth Once Daily 5)  Hydrocodone-Acetaminophen 10-325 Mg Tabs (Hydrocodone-Acetaminophen) .Marland Kitchen.. 1 Q4h As Needed Pain 6)  Ted Hose To Knees .Marland Kitchen.. 415.19 7)  Warfarin Sodium 5 Mg Tabs (Warfarin Sodium) .... Take As Directed By Coumadin Clinic. 8)  Triamcinolone Acetonide 0.025 % Crea (Triamcinolone Acetonide) .... Three Times A Day As Needed Itching 9)  Benicar 40 Mg Tabs (Olmesartan Medoxomil) .Marland Kitchen.. 1 Qd 10)  Halobetasol Propionate 0.05 % Crea (Halobetasol Propionate) .... Three Times A Day As Needed For Rash 11)  Crestor 40 Mg Tabs (Rosuvastatin Calcium) .Marland Kitchen.. 1 Once Daily 12)  Allopurinol 300 Mg Tabs (Allopurinol) .Marland Kitchen.. 1  Tab Once Daily 13)  Verapamil Hcl Cr 240 Mg Cr-Tabs (Verapamil Hcl) .Marland Kitchen.. 1 Tab Once Daily 14)  Azithromycin 250 Mg Tabs (Azithromycin) .... 2po Qd For 1 Day, Then 1po Qd For 4days, Then Stop 15)  Cialis 20 Mg Tabs (Tadalafil) .... For As Needed Use 16)  Tussionex Pennkinetic Er 10-8 Mg/62ml Lqcr (Hydrocod Polst-Chlorphen Polst) .Marland Kitchen.. 1 Tsp By Mouth Two Times A Day As Needed (Generic Ok) 17)  Prednisone 10 Mg Tabs (Prednisone) .... 3po Qd For 3days, Then 2po Qd For 3days, Then 1po Qd For 3days, Then Stop  Allergies (verified): 1)  ! Metformin Hcl (Metformin Hcl)  Past History:  Past Medical History: Last updated: 05/05/2009 Hypertension Smoker Edema Leukopenia Hyperglycemia LE Varicosities Hyperuricemia E.D. PE DVT  Review of Systems  The patient denies dyspnea on exertion, weight loss, and weight gain.    Physical Exam  General:  obese.  no distress  Extremities:  no edema Additional Exam:  Hemoglobin A1C       [H]  6.9 %                       4.6-6.5   Total Bilirubin           1.0  mg/dL                   1.6-1.0   Direct Bilirubin          0.1 mg/dL                   9.6-0.4   Alkaline Phosphatase      62 U/L                      39-117   AST                       20 U/L                      0-37   ALT                       24 U/L                      0-53   Total Protein             7.1 g/dL       Impression & Recommendations:  Problem # 1:  HYPERTENSION (ICD-401.9) needs increased rx  Problem # 2:  DM (ICD-250.00) Assessment: Deteriorated  Problem # 3:  DYSLIPIDEMIA (ICD-272.4) needs increased rx  Other Orders: TLB-A1C / Hgb A1C (Glycohemoglobin) (83036-A1C) TLB-Hepatic/Liver Function Pnl (80076-HEPATIC) Est. Patient Level IV (54098)   Patient Instructions: 1)  blood tests are being ordered for you today.  please call [phone] to hear your test results.   2)  here are some samples of benicar 40 mg.  another option is to change to "losartan" (a generic  competitor).    3)  Please schedule a regular physical appointment in 5 months. 4)  (update: i left message on phone-tree:  you should consider adding welchol, instead of changing zocor to crestor). Prescriptions: CRESTOR 40 MG TABS (ROSUVASTATIN CALCIUM) 1 once daily  #30 x 11   Entered and Authorized by:   Minus Breeding MD   Signed by:   Minus Breeding MD on 02/10/2011   Method used:   Electronically to        [facility] [address] (retail)       [address]       [place], [place]  [address]       Ph: [phone] or [phone]       Fax: [phone]   RxID:   [id number]    Orders Added: 1)  TLB-A1C / Hgb A1C (Glycohemoglobin) [83036-A1C] 2)  TLB-Hepatic/Liver Function Pnl [80076-HEPATIC] 3)  Est. Patient Level IV [87564]

## 2011-02-24 ENCOUNTER — Encounter: Payer: Self-pay | Admitting: Endocrinology

## 2011-02-24 DIAGNOSIS — I82409 Acute embolism and thrombosis of unspecified deep veins of unspecified lower extremity: Secondary | ICD-10-CM

## 2011-02-24 DIAGNOSIS — I2699 Other pulmonary embolism without acute cor pulmonale: Secondary | ICD-10-CM

## 2011-02-26 ENCOUNTER — Other Ambulatory Visit: Payer: Self-pay | Admitting: Endocrinology

## 2011-02-26 DIAGNOSIS — M545 Low back pain, unspecified: Secondary | ICD-10-CM

## 2011-03-02 ENCOUNTER — Other Ambulatory Visit: Payer: Self-pay | Admitting: Endocrinology

## 2011-03-02 ENCOUNTER — Ambulatory Visit
Admission: RE | Admit: 2011-03-02 | Discharge: 2011-03-02 | Disposition: A | Discharge status: Home / Self Care | Payer: 59 | Source: Ambulatory Visit | Attending: Endocrinology | Admitting: Endocrinology

## 2011-03-02 DIAGNOSIS — M545 Low back pain, unspecified: Secondary | ICD-10-CM

## 2011-03-14 LAB — BASIC METABOLIC PANEL
Calcium: 8.5 mg/dL (ref 8.4–10.5)
GFR calc non Af Amer: 55 mL/min — ABNORMAL LOW (ref >60)
Sodium: 140 mEq/L (ref 135–145)

## 2011-03-14 LAB — CBC
HCT: 40.9 % (ref 39.0–52.0)
MCV: 83.4 fL (ref 78.0–100.0)
RDW: 15.6 % — ABNORMAL HIGH (ref 11.5–15.5)
WBC: 5.1 10*3/uL (ref 4.0–10.5)

## 2011-03-16 LAB — COMPREHENSIVE METABOLIC PANEL
ALT: 31 U/L (ref 0–53)
AST: 27 U/L (ref 0–37)
Alkaline Phosphatase: 64 U/L (ref 39–117)
CO2: 31 mEq/L (ref 19–32)
Chloride: 105 mEq/L (ref 96–112)
GFR calc non Af Amer: >60 mL/min (ref >60)
Glucose, Bld: 96 mg/dL (ref 70–99)
Potassium: 4.2 mEq/L (ref 3.5–5.1)
Sodium: 143 mEq/L (ref 135–145)

## 2011-03-16 LAB — URINALYSIS, ROUTINE W REFLEX MICROSCOPIC
Glucose, UA: NEGATIVE mg/dL
Ketones, ur: NEGATIVE mg/dL
Specific Gravity, Urine: 1.024 (ref 1.005–1.030)
pH: 6.5 (ref 5.0–8.0)

## 2011-03-16 LAB — CBC
HCT: 44.3 % (ref 39.0–52.0)
Hemoglobin: 14.3 g/dL (ref 13.0–17.0)
RBC: 5.28 MIL/uL (ref 4.22–5.81)
WBC: 5 10*3/uL (ref 4.0–10.5)

## 2011-03-16 LAB — DIFFERENTIAL
Basophils Relative: 3 % — ABNORMAL HIGH (ref 0–1)
Eosinophils Absolute: 0.2 10*3/uL (ref 0.0–0.7)
Eosinophils Relative: 4 % (ref 0–5)
Neutrophils Relative %: 54 % (ref 43–77)

## 2011-03-16 LAB — PROTIME-INR: Prothrombin Time: 21.6 seconds — ABNORMAL HIGH (ref 11.6–15.2)

## 2011-03-16 LAB — LIPASE, BLOOD: Lipase: 146 U/L (ref 23–300)

## 2011-03-30 ENCOUNTER — Other Ambulatory Visit: Payer: Self-pay | Admitting: Endocrinology

## 2011-04-03 ENCOUNTER — Encounter: Payer: Self-pay | Admitting: *Deleted

## 2011-04-03 ENCOUNTER — Other Ambulatory Visit: Payer: Self-pay | Admitting: *Deleted

## 2011-04-03 ENCOUNTER — Ambulatory Visit (INDEPENDENT_AMBULATORY_CARE_PROVIDER_SITE_OTHER): Payer: 59 | Admitting: *Deleted

## 2011-04-03 DIAGNOSIS — I2699 Other pulmonary embolism without acute cor pulmonale: Secondary | ICD-10-CM

## 2011-04-03 DIAGNOSIS — Z7901 Long term (current) use of anticoagulants: Secondary | ICD-10-CM

## 2011-04-03 DIAGNOSIS — I82409 Acute embolism and thrombosis of unspecified deep veins of unspecified lower extremity: Secondary | ICD-10-CM

## 2011-04-03 HISTORY — DX: Long term (current) use of anticoagulants: Z79.01

## 2011-04-03 MED ORDER — WARFARIN SODIUM 5 MG PO TABS: 5.0000 mg | ORAL_TABLET | ORAL | Status: DC

## 2011-04-08 LAB — GLUCOSE, CAPILLARY
Glucose-Capillary: 108 mg/dL — ABNORMAL HIGH (ref 70–99)
Glucose-Capillary: 109 mg/dL — ABNORMAL HIGH (ref 70–99)
Glucose-Capillary: 112 mg/dL — ABNORMAL HIGH (ref 70–99)
Glucose-Capillary: 114 mg/dL — ABNORMAL HIGH (ref 70–99)
Glucose-Capillary: 116 mg/dL — ABNORMAL HIGH (ref 70–99)

## 2011-04-08 LAB — PROTIME-INR
INR: 1.2 (ref 0.00–1.49)
INR: 1.7 — ABNORMAL HIGH (ref 0.00–1.49)
Prothrombin Time: 14.6 seconds (ref 11.6–15.2)
Prothrombin Time: 21.3 seconds — ABNORMAL HIGH (ref 11.6–15.2)

## 2011-04-09 LAB — DIFFERENTIAL
Eosinophils Relative: 4 % (ref 0–5)
Lymphocytes Relative: 22 % (ref 12–46)
Lymphs Abs: 1.4 10*3/uL (ref 0.7–4.0)
Monocytes Absolute: 0.7 10*3/uL (ref 0.1–1.0)

## 2011-04-09 LAB — CBC
HCT: 44.2 % (ref 39.0–52.0)
Hemoglobin: 12.9 g/dL — ABNORMAL LOW (ref 13.0–17.0)
MCHC: 32.7 g/dL (ref 30.0–36.0)
MCHC: 33 g/dL (ref 30.0–36.0)
MCV: 83.3 fL (ref 78.0–100.0)
MCV: 83.8 fL (ref 78.0–100.0)
Platelets: 183 10*3/uL (ref 150–400)
RDW: 16.2 % — ABNORMAL HIGH (ref 11.5–15.5)
RDW: 16.3 % — ABNORMAL HIGH (ref 11.5–15.5)
WBC: 6.2 10*3/uL (ref 4.0–10.5)

## 2011-04-09 LAB — BASIC METABOLIC PANEL
CO2: 33 mEq/L — ABNORMAL HIGH (ref 19–32)
Calcium: 8.6 mg/dL (ref 8.4–10.5)
Creatinine, Ser: 1.3 mg/dL (ref 0.4–1.5)
Glucose, Bld: 110 mg/dL — ABNORMAL HIGH (ref 70–99)
Sodium: 141 mEq/L (ref 135–145)

## 2011-04-09 LAB — GLUCOSE, CAPILLARY
Glucose-Capillary: 118 mg/dL — ABNORMAL HIGH (ref 70–99)
Glucose-Capillary: 134 mg/dL — ABNORMAL HIGH (ref 70–99)

## 2011-04-09 LAB — CARDIAC PANEL(CRET KIN+CKTOT+MB+TROPI)
Relative Index: 0.5 (ref 0.0–2.5)
Total CK: 332 U/L — ABNORMAL HIGH (ref 7–232)
Troponin I: 0.01 ng/mL (ref 0.00–0.06)

## 2011-04-09 LAB — CK TOTAL AND CKMB (NOT AT ARMC)
CK, MB: 1.4 ng/mL (ref 0.3–4.0)
Total CK: 366 U/L — ABNORMAL HIGH (ref 7–232)

## 2011-04-09 LAB — PROTIME-INR
INR: 1 (ref 0.00–1.49)
Prothrombin Time: 13.3 seconds (ref 11.6–15.2)

## 2011-04-10 LAB — GLUCOSE, CAPILLARY: Glucose-Capillary: 167 mg/dL — ABNORMAL HIGH (ref 70–99)

## 2011-04-10 LAB — D-DIMER, QUANTITATIVE

## 2011-04-11 ENCOUNTER — Other Ambulatory Visit: Payer: Self-pay | Admitting: Endocrinology

## 2011-04-17 ENCOUNTER — Ambulatory Visit (INDEPENDENT_AMBULATORY_CARE_PROVIDER_SITE_OTHER): Payer: 59 | Admitting: *Deleted

## 2011-04-17 ENCOUNTER — Encounter: Payer: 59 | Admitting: *Deleted

## 2011-04-17 DIAGNOSIS — I2699 Other pulmonary embolism without acute cor pulmonale: Secondary | ICD-10-CM

## 2011-04-17 DIAGNOSIS — I82409 Acute embolism and thrombosis of unspecified deep veins of unspecified lower extremity: Secondary | ICD-10-CM

## 2011-04-18 ENCOUNTER — Telehealth: Payer: Self-pay | Admitting: Endocrinology

## 2011-04-18 NOTE — Telephone Encounter (Signed)
Pt would like to get a "faster acting" Rx for his Gout. Pt c/o arm swelling fro gout that the Allopurinol is not helping. Pt would like the new Rx to be sent in to [facility] on [address] [place]

## 2011-04-19 NOTE — Telephone Encounter (Signed)
There is colchicine, but it interacts with coumadin.  The steroids raise your blood sugar.  Please offer ov 04/21/11.

## 2011-04-20 ENCOUNTER — Emergency Department (HOSPITAL_BASED_OUTPATIENT_CLINIC_OR_DEPARTMENT_OTHER)
Admission: EM | Admit: 2011-04-20 | Discharge: 2011-04-20 | Disposition: A | Discharge status: Home / Self Care | Payer: 59 | Attending: Emergency Medicine | Admitting: Emergency Medicine

## 2011-04-20 DIAGNOSIS — E7800 Pure hypercholesterolemia, unspecified: Secondary | ICD-10-CM | POA: Insufficient documentation

## 2011-04-20 DIAGNOSIS — M25529 Pain in unspecified elbow: Secondary | ICD-10-CM | POA: Insufficient documentation

## 2011-04-20 DIAGNOSIS — M109 Gout, unspecified: Secondary | ICD-10-CM | POA: Insufficient documentation

## 2011-04-20 DIAGNOSIS — E119 Type 2 diabetes mellitus without complications: Secondary | ICD-10-CM | POA: Insufficient documentation

## 2011-04-20 DIAGNOSIS — I10 Essential (primary) hypertension: Secondary | ICD-10-CM | POA: Insufficient documentation

## 2011-04-22 NOTE — Telephone Encounter (Signed)
Pt advised that when he left message for SAE he stated that he could not bend or raise arm and requested advisement ASAP. Pt eventually went to ER Friday night and is now feeling much better. I apologized for the delay and pt understood.

## 2011-05-07 ENCOUNTER — Ambulatory Visit (INDEPENDENT_AMBULATORY_CARE_PROVIDER_SITE_OTHER): Payer: 59 | Admitting: *Deleted

## 2011-05-07 DIAGNOSIS — I2699 Other pulmonary embolism without acute cor pulmonale: Secondary | ICD-10-CM

## 2011-05-07 DIAGNOSIS — I82409 Acute embolism and thrombosis of unspecified deep veins of unspecified lower extremity: Secondary | ICD-10-CM

## 2011-05-08 ENCOUNTER — Encounter: Payer: 59 | Admitting: *Deleted

## 2011-05-13 NOTE — Op Note (Signed)
NAME:  ADONAY, SCHEIER NO.:  [id number]   MEDICAL RECORD NO.:  [id number]          PATIENT TYPE:  AMB   LOCATION:  DSC                          FACILITY:  [facility]   PHYSICIAN:  Cherylynn Ridges, M.D.    DATE OF BIRTH:  1966-06-05   DATE OF PROCEDURE:  05/26/2008  DATE OF DISCHARGE:                               OPERATIVE REPORT   PREOPERATIVE DIAGNOSIS:  Left posterior scalp sebaceous cyst with  satellite lesion inferior and lateral to that.   POSTOPERATIVE DIAGNOSIS:  Left posterior scalp sebaceous cyst with  satellite lesion inferior and lateral to that.   PROCEDURE:  Excision of primary 3 cm left posterior scalp sebaceous  cyst.   SURGEON:  Cherylynn Ridges, MD   ANESTHESIA:  Monitored anesthesia care with 0.5% Marcaine and 2%  Xylocaine with epinephrine anesthesia with local anesthetic.   ESTIMATED BLOOD LOSS:  Less than 20 mL.   COMPLICATIONS:  None.   CONDITION:  Stable.   FINDINGS:  The patient had a 2-3 cm posterior scalp sebaceous cyst which  was not infected.  There was a satellite lesion just to the right and  inferior to that which was not excised.   OPERATION:  The patient was taken to the operating room, placed on table  in supine position.  After an adequate amount of IV sedation was given,  he was placed in the prone position and then the scalp was prepped by  shaving the hair and then prepping with Betadine in usual sterile  manner.   We anesthetized the area in an oval fashion around the lesion on the  posterior scalp with a 27 gauge needle and 0.5% Marcaine with  epinephrine and 2% Xylocaine with epinephrine.  Approximately 8 mL were  used.  We then made an oval incision around the lesion of the posterior  scalp and then dissected out the cyst without rupturing it using #15  blade primarily.  Hemostasis was obtained with electrocautery.  Once we  had adequate hemostasis, we closed in a single layer of interrupted  simple stitches of 3-0  Prolene.  Antibiotic ointment and 4 x 4 were  applied as dressing.  All counts were correct.      Cherylynn Ridges, M.D.  Electronically Signed    JOW/MEDQ  D:  05/26/2008  T:  05/27/2008  Job:  [id number]

## 2011-05-13 NOTE — Discharge Summary (Signed)
NAME:  Spencer English, Spencer English NO.:  [id number]   MEDICAL RECORD NO.:  [id number]          PATIENT TYPE:  INP   LOCATION:  1405                         FACILITY:  [facility]   PHYSICIAN:  Rosalyn Gess. Norins, MD  DATE OF BIRTH:  04-28-1966   DATE OF ADMISSION:  04/26/2009  DATE OF DISCHARGE:  04/30/2009                               DISCHARGE SUMMARY   ADMITTING DIAGNOSES:  1. Chest pain.  2. Diabetes.  3. Dyslipidemia.  4. Hypertension.   DISCHARGE DIAGNOSES:  1. Pulmonary embolism with deep venous thrombosis.  2. Diabetes.  3. Dyslipidemia.  4. Hypertension.   CONSULTANTS:  None.   PROCEDURES:  1. Chest x-ray day of admission, the 29th, with low lung volumes.      Favor bibasilar opacities due to atelectasis.  2. CT angiogram performed April 30 which showed bilateral lower lobe      pulmonary emboli.   HISTORY OF PRESENT ILLNESS:  The patient is a 45 year old African  American gentleman who works as a Naval architect who had an episode of  pain in his legs in December.  Was seen in the emergency department, and  diagnosed with having superficial thrombophlebitis and was treated with  ibuprofen.   The patient presented acutely after he had trouble getting his breath  and significant chest pain on the night prior to admission which  occurred again on the morning of admission.  He rated it as 3/10 since  being in the emergency department.  Symptoms have been intermittent  therefore for 8-12 hours.  He had exertional chest pain and shortness of  breath, but denied resting chest pain, nausea, vomiting, diaphoresis,  palpitations, dizziness, syncope or indigestion.  Pain was located in  the left anterior chest wall.  Episodes lasted greater than 30 minutes.  For this reason, he was admitted to rule out for MI.   Please see the EMR admit note for past medical history, family history,  social history, and admission examination.   HOSPITAL COURSE:  1. At time of admission  the patient had a D-dimer that was markedly      elevated at 14.56.  He was subsequently sent for CT angio which      came back positive for pulmonary emboli.  Of note, the patient      ruled out for cardiac disease with negative cardiac enzymes and      normal EKG.   With DVT and pulmonary emboli, the patient was started on Lovenox full  dose and Coumadin.  The patient remained stable during the course of his  hospitalization with no recurrent chest pain or chest discomfort.  No  respiratory distress.  With the patient being stable and workup being  completed, he is thought to be stable for discharge home on Lovenox at  120 mg subcu b.i.d. until therapeutic on his Coumadin.  INR on the day  of discharge is 1.7.  The patient will be given an appointment at  [facility] for follow-up Tuesday, May 4, for  adjustment in medication and further direction per the Coagulopathy  Clinic.  He will be given 12.5 mg on the day of discharge.   Problems #2 through #4, otherwise, stable.  The patient will continue on  his home medications.  He will follow up with Dr. Romero Belling in 10-14  days.   DISCHARGE PHYSICAL EXAMINATION:  Temperature was 98.5, blood pressure  107/60, heart rate 72, respirations 19, O2 sats 98% on room air, CBG  116.  CHEST:  Clear.  CARDIOVASCULAR:  With a regular rate and rhythm without murmurs, rubs or  gallops and unremarkable.  Legs with no tenderness to palpation.   DISPOSITION:  Patient to be discharged home.   DISCHARGE MEDICATIONS:  The patient will resume his home medications:   1. Lasix 40 mg daily.  2. Allegra 180 mg daily.  3. Januvia 100 mg daily.  4. Zocor 80 mg daily.  5. Cialis 2.5 mg as needed.  6. Hyzaar 100/25 daily.  7. He will take Lovenox injections 120 mg subcu b.i.d.  8. He will take Coumadin 5 mg tabs 2.5 tabs today at 6 p.m., and will      follow up in the Coagulopathy Clinic on Tuesday, May 4.   Patient is  instructed to wear support stockings while driving and to be  sure when driving to stop every hour or two to move about, walk around,  and prevent further DVT.  The patient may return to work May 10.   PATIENT'S CONDITION AT TIME OF DISCHARGE DICTATION:  Stable, improved.      Rosalyn Gess Norins, MD  Electronically Signed     MEN/MEDQ  D:  04/30/2009  T:  04/30/2009  Job:  [id number]   cc:   Gregary Signs A. Everardo All, MD  [address]  [place]  [place] [address]   Shelby Dubin, PharmD, BCPS, CPP  [address] [place], [place] [address]

## 2011-05-29 ENCOUNTER — Encounter: Payer: 59 | Admitting: *Deleted

## 2011-06-16 ENCOUNTER — Encounter: Payer: Self-pay | Admitting: Endocrinology

## 2011-06-16 ENCOUNTER — Other Ambulatory Visit (INDEPENDENT_AMBULATORY_CARE_PROVIDER_SITE_OTHER): Payer: 59

## 2011-06-16 ENCOUNTER — Ambulatory Visit (INDEPENDENT_AMBULATORY_CARE_PROVIDER_SITE_OTHER): Payer: 59 | Admitting: Endocrinology

## 2011-06-16 DIAGNOSIS — Z119 Encounter for screening for infectious and parasitic diseases, unspecified: Secondary | ICD-10-CM | POA: Insufficient documentation

## 2011-06-16 DIAGNOSIS — I2699 Other pulmonary embolism without acute cor pulmonale: Secondary | ICD-10-CM

## 2011-06-16 DIAGNOSIS — R7989 Other specified abnormal findings of blood chemistry: Secondary | ICD-10-CM

## 2011-06-16 DIAGNOSIS — E119 Type 2 diabetes mellitus without complications: Secondary | ICD-10-CM

## 2011-06-16 LAB — HIV ANTIBODY (ROUTINE TESTING W REFLEX): HIV: NONREACTIVE

## 2011-06-16 LAB — PROTIME-INR
INR: 1.5 ratio — ABNORMAL HIGH (ref 0.8–1.0)
Prothrombin Time: 16.4 s — ABNORMAL HIGH (ref 10.2–12.4)

## 2011-06-16 MED ORDER — ALLOPURINOL 300 MG PO TABS: 300.0000 mg | ORAL_TABLET | Freq: Every day | ORAL | Status: DC

## 2011-06-16 MED ORDER — WARFARIN SODIUM 5 MG PO TABS: 5.0000 mg | ORAL_TABLET | ORAL | Status: DC

## 2011-06-16 NOTE — Progress Notes (Signed)
Subjective:    Patient ID: Spencer English, male    DOB: Jun 22, 1966, 45 y.o.   MRN: [id number]  HPI Pt states 2 mos of intermittent moderate bilat elbow pain, and assoc swelling.  He took allopurinol.  He started taking allopurinol, but says this takes too long to work.  He has since run out of the allopurinol.  no cbg record, but states cbg's are well-controlled.  He says weight loss has improved his bp. He takes coumadin 5 mg in Friday, 7.5 mg the other 6 days. He has 1 day of left eye redness. Past Medical History  Diagnosis Date  . DM 08/08/2008  . DYSLIPIDEMIA 04/26/2009  . GOUT 10/24/2010  . HYPERTENSION 07/21/2007  . PULMONARY EMBOLISM 05/03/2009  . DEEP VENOUS THROMBOPHLEBITIS, LEG, LEFT 05/05/2009  . ALLERGIC RHINITIS 04/12/2009  . UNSPECIFIED URINARY CALCULUS 07/04/2010  . 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 ECZEMA 05/23/2010  . Encounter for long-term (current) use of anticoagulants 04/03/2011  . BACK PAIN, LUMBAR 01/14/2011  . 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 Hyperglycemia   . Leukopenia     Past Surgical History  Procedure Date  . Anterior cruciate ligament repair 2000    Right    History   Social History  . Marital Status: Married    Spouse Name: N/A    Number of Children: N/A  . 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 Smoking status: Former Games developer  . Smokeless tobacco: Not on file  . Alcohol Use: No  . Drug Use: No  . Sexually Active:    Other Topics Concern  . Not on file   Social History Narrative  . No narrative on file    Current Outpatient Prescriptions on File Prior to Visit  Medication Sig Dispense Refill  . allopurinol (ZYLOPRIM) 300 MG tablet Take 300 mg by mouth daily.        . fexofenadine (ALLEGRA) 180 MG tablet Take 180 mg by mouth daily.        . fluticasone (FLONASE) 50 MCG/ACT nasal spray Place 2 sprays into the nose daily.        . furosemide (LASIX) 40 MG tablet Take 40 mg by  mouth daily.        . halobetasol (ULTRAVATE) 0.05 % cream APPLY  CREAM TOPICALLY THREE TIMES DAILY AS NEEDED FOR RASH  50 g  2  . HYDROcodone-acetaminophen (NORCO) 10-325 MG per tablet Take 1 tablet by mouth every 4 (four) hours as needed. For pain       . rosuvastatin (CRESTOR) 40 MG tablet Take 40 mg by mouth daily.        . sitaGLIPtan (JANUVIA) 100 MG tablet Take 100 mg by mouth daily.        Marland Kitchen triamcinolone (KENALOG) 0.025 % cream Apply topically 3 (three) times daily as needed. For itching       . verapamil (CALAN-SR) 240 MG CR tablet Take 240 mg by mouth daily.        Marland Kitchen warfarin (COUMADIN) 5 MG tablet Take 1 tablet (5 mg total) by mouth as directed.  45 tablet  1  . olmesartan (BENICAR) 40 MG tablet Take 40 mg by mouth daily.        . tadalafil (CIALIS) 20 MG tablet Take 20 mg by mouth as needed.          Allergies  Allergen Reactions  . Metformin  REACTION: nausea/diarrhea    Family History  Problem Relation Age of Onset  . Heart disease Neg Hx     no premature CAD known    BP 140/92  Pulse 89  Temp(Src) 98.8 F (37.1 C) (Oral)  Ht 6' (1.829 m)  Wt 249 lb 12.8 oz (113.309 kg)  BMI 33.88 kg/m2  SpO2 97% Review of Systems  Constitutional: Positive for fatigue.  HENT: Negative for hearing loss.   Eyes: Negative for visual disturbance.  Respiratory: Negative for shortness of breath.   Cardiovascular: Negative for chest pain.  Gastrointestinal: Negative for blood in stool.  Genitourinary: Negative for hematuria.  Neurological: Negative for syncope and headaches.  Psychiatric/Behavioral: Negative for sleep disturbance.       He has mild depression, due to marital separation   denies hypoglycemia and fever    Objective:   Physical Exam GENERAL: no distress. Obese. Left eye: there is slight conjunctival injection Ext: both elbows are slightly swollen, but no erythema/tend/warmth.  Full rom, without pain.    Lab Results  Component Value Date   HGBA1C 7.0*  06/16/2011   Lab Results  Component Value Date   INR 1.5* 06/16/2011   INR 1.7 05/07/2011   INR 2.0 04/17/2011   PROTIME 20.9 05/31/2009    Assessment & Plan:  Anticoagulation.  Noncompliance causes him to have high-risk of recurrent dvt, or of bleeding.  i'll do the best i can. Bereavement, new Weight loss, due to bereavement Htn, improved due to weight loss Conjunctivitis, prob allergic, new Gout, needs increased rx Elbow pain, possibly due to gout

## 2011-06-16 NOTE — Patient Instructions (Addendum)
The allopurinol is good to keep the gout away, but it is of no use once the pain has started.  Please take the allopurinol daily. blood tests are being ordered for you today.  please call [phone] to hear your test results.  You will be prompted to enter the 9-digit "MRN" number that appears at the top left of this page, followed by #.  Then you will hear the message. Please make a regular physical appointment in 3 months. On a 1-time basis, we can check the coumadin blood test here, and refill the medication.  We cannot do this on an ongoing basis, though. (update: i left message on phone-tree:  rx as we discussed, including same coumadin (not increased, due to resumption of the allopurinol).  See coumadin clinic asap.  resume allopurinol qd.  Take "zatidor" eye drops.)

## 2011-06-30 ENCOUNTER — Other Ambulatory Visit: Payer: Self-pay | Admitting: Endocrinology

## 2011-06-30 ENCOUNTER — Other Ambulatory Visit: Payer: Self-pay

## 2011-06-30 DIAGNOSIS — Z0000 Encounter for general adult medical examination without abnormal findings: Secondary | ICD-10-CM

## 2011-06-30 DIAGNOSIS — E119 Type 2 diabetes mellitus without complications: Secondary | ICD-10-CM

## 2011-07-25 ENCOUNTER — Ambulatory Visit (INDEPENDENT_AMBULATORY_CARE_PROVIDER_SITE_OTHER): Payer: 59 | Admitting: *Deleted

## 2011-07-25 DIAGNOSIS — I82409 Acute embolism and thrombosis of unspecified deep veins of unspecified lower extremity: Secondary | ICD-10-CM

## 2011-07-25 DIAGNOSIS — I2699 Other pulmonary embolism without acute cor pulmonale: Secondary | ICD-10-CM

## 2011-07-31 ENCOUNTER — Encounter: Payer: Self-pay | Admitting: Endocrinology

## 2011-08-19 ENCOUNTER — Ambulatory Visit (INDEPENDENT_AMBULATORY_CARE_PROVIDER_SITE_OTHER): Payer: 59 | Admitting: *Deleted

## 2011-08-19 DIAGNOSIS — I82409 Acute embolism and thrombosis of unspecified deep veins of unspecified lower extremity: Secondary | ICD-10-CM

## 2011-08-19 DIAGNOSIS — I2699 Other pulmonary embolism without acute cor pulmonale: Secondary | ICD-10-CM

## 2011-08-19 LAB — POCT INR: INR: 1.8

## 2011-08-19 MED ORDER — WARFARIN SODIUM 5 MG PO TABS: ORAL_TABLET | ORAL | Status: DC

## 2011-08-22 ENCOUNTER — Encounter: Payer: 59 | Admitting: *Deleted

## 2011-09-06 ENCOUNTER — Other Ambulatory Visit: Payer: Self-pay | Admitting: Endocrinology

## 2011-09-18 ENCOUNTER — Encounter: Payer: 59 | Admitting: *Deleted

## 2011-09-24 LAB — POCT I-STAT, CHEM 8
BUN: 13
Hemoglobin: 16
Sodium: 138
TCO2: 29

## 2011-09-25 ENCOUNTER — Other Ambulatory Visit: Payer: Self-pay | Admitting: Endocrinology

## 2011-09-26 NOTE — Telephone Encounter (Signed)
Ov due.  Let's address then 

## 2011-09-26 NOTE — Telephone Encounter (Signed)
Pt send refill request for Simvastatin instead of Crestor, okay to change?

## 2011-09-30 ENCOUNTER — Other Ambulatory Visit (INDEPENDENT_AMBULATORY_CARE_PROVIDER_SITE_OTHER): Payer: 59

## 2011-09-30 ENCOUNTER — Other Ambulatory Visit: Payer: Self-pay | Admitting: Endocrinology

## 2011-09-30 ENCOUNTER — Encounter: Payer: Self-pay | Admitting: Endocrinology

## 2011-09-30 ENCOUNTER — Ambulatory Visit (INDEPENDENT_AMBULATORY_CARE_PROVIDER_SITE_OTHER): Payer: 59 | Admitting: Endocrinology

## 2011-09-30 ENCOUNTER — Ambulatory Visit (INDEPENDENT_AMBULATORY_CARE_PROVIDER_SITE_OTHER): Payer: 59 | Admitting: *Deleted

## 2011-09-30 VITALS — BP 136/84 | HR 88 | Temp 98.8°F | Ht 72.0 in | Wt 254.8 lb

## 2011-09-30 DIAGNOSIS — I2699 Other pulmonary embolism without acute cor pulmonale: Secondary | ICD-10-CM

## 2011-09-30 DIAGNOSIS — E119 Type 2 diabetes mellitus without complications: Secondary | ICD-10-CM

## 2011-09-30 DIAGNOSIS — Z0000 Encounter for general adult medical examination without abnormal findings: Secondary | ICD-10-CM

## 2011-09-30 DIAGNOSIS — I82409 Acute embolism and thrombosis of unspecified deep veins of unspecified lower extremity: Secondary | ICD-10-CM

## 2011-09-30 LAB — HEPATIC FUNCTION PANEL
ALT: 26 U/L (ref 0–53)
AST: 20 U/L (ref 0–37)
Albumin: 3.9 g/dL (ref 3.5–5.2)

## 2011-09-30 LAB — LIPID PANEL
HDL: 49.1 mg/dL (ref >39.00)
Triglycerides: 94 mg/dL (ref 0.0–149.0)
VLDL: 18.8 mg/dL (ref 0.0–40.0)

## 2011-09-30 LAB — BASIC METABOLIC PANEL
CO2: 30 mEq/L (ref 19–32)
Calcium: 8.7 mg/dL (ref 8.4–10.5)
Chloride: 105 mEq/L (ref 96–112)
Potassium: 4.3 mEq/L (ref 3.5–5.1)
Sodium: 141 mEq/L (ref 135–145)

## 2011-09-30 LAB — CBC WITH DIFFERENTIAL/PLATELET
Eosinophils Relative: 3.5 % (ref 0.0–5.0)
HCT: 46.2 % (ref 39.0–52.0)
Monocytes Relative: 7.5 % (ref 3.0–12.0)
Neutrophils Relative %: 62.3 % (ref 43.0–77.0)
Platelets: 204 10*3/uL (ref 150.0–400.0)
WBC: 6.2 10*3/uL (ref 4.5–10.5)

## 2011-09-30 LAB — URINALYSIS
Bilirubin Urine: NEGATIVE
Hgb urine dipstick: NEGATIVE
Nitrite: NEGATIVE
Total Protein, Urine: NEGATIVE

## 2011-09-30 LAB — POCT INR: INR: 2.7

## 2011-09-30 LAB — TSH: TSH: 1 u[IU]/mL (ref 0.35–5.50)

## 2011-09-30 MED ORDER — WARFARIN SODIUM 5 MG PO TABS: ORAL_TABLET | ORAL | Status: DC

## 2011-09-30 NOTE — Progress Notes (Signed)
Subjective:    Patient ID: Spencer English, male    DOB: Jun 29, 1966, 45 y.o.   MRN: [id number]  HPI pt states he feels well in general.  He has lost 20-30 lbs, due to his efforts.  no cbg record, but states cbg's are well-controlled Past Medical History  Diagnosis Date  . DM 08/08/2008  . DYSLIPIDEMIA 04/26/2009  . GOUT 10/24/2010  . HYPERTENSION 07/21/2007  . PULMONARY EMBOLISM 05/03/2009  . DEEP VENOUS THROMBOPHLEBITIS, LEG, LEFT 05/05/2009  . ALLERGIC RHINITIS 04/12/2009  . UNSPECIFIED URINARY CALCULUS 07/04/2010  . 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 ECZEMA 05/23/2010  . Encounter for long-term (current) use of anticoagulants 04/03/2011  . BACK PAIN, LUMBAR 01/14/2011  . 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 Hyperglycemia   . Leukopenia     Past Surgical History  Procedure Date  . Anterior cruciate ligament repair 2000    Right    History   Social History  . Marital Status: Married    Spouse Name: N/A    Number of Children: N/A  . 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 Smoking status: Former Games developer  . Smokeless tobacco: Not on file  . Alcohol Use: No  . Drug Use: No  . Sexually Active:    Other Topics Concern  . Not on file   Social History Narrative  . No narrative on file    Current Outpatient Prescriptions on File Prior to Visit  Medication Sig Dispense Refill  . furosemide (LASIX) 40 MG tablet Take 40 mg by mouth daily.        Marland Kitchen olmesartan (BENICAR) 40 MG tablet Take 40 mg by mouth daily.        . sitaGLIPtan (JANUVIA) 100 MG tablet Take 100 mg by mouth daily.        Marland Kitchen triamcinolone (KENALOG) 0.025 % cream Apply topically 3 (three) times daily as needed. For itching       . verapamil (CALAN-SR) 240 MG CR tablet Take 240 mg by mouth daily.        Marland Kitchen warfarin (COUMADIN) 5 MG tablet Use as directed by Anticoagulation Clinic.  45 tablet  0  . fexofenadine (ALLEGRA) 180 MG tablet Take 180 mg by mouth as needed.        . fluticasone (FLONASE) 50 MCG/ACT nasal spray Place 2 sprays into the nose daily.        . halobetasol (ULTRAVATE) 0.05 % cream APPLY  CREAM TOPICALLY THREE TIMES DAILY AS NEEDED FOR RASH  50 g  2  . HYDROcodone-acetaminophen (NORCO) 10-325 MG per tablet Take 1 tablet by mouth every 4 (four) hours as needed. For pain       . rosuvastatin (CRESTOR) 40 MG tablet Take 40 mg by mouth daily.        . tadalafil (CIALIS) 20 MG tablet Take 20 mg by mouth as needed.          Allergies  Allergen Reactions  . Metformin     REACTION: nausea/diarrhea    Family History  Problem Relation Age of Onset  . Heart disease Neg Hx     no premature CAD known    BP 136/84  Pulse 88  Temp(Src) 98.8 F (37.1 C) (Oral)  Ht 6' (1.829 m)  Wt 254 lb 12.8 oz (115.577 kg)  BMI 34.56 kg/m2  SpO2 96%   Review of Systems Denies chest pain/sob weight change.  Objective:   Physical Exam Pulses: dorsalis pedis intact bilat.   Feet: no deformity.  no ulcer on the feet.  feet are of normal color and temp.  no edema.  There are bilat varicosities.  There is bilateral onychomycosis Neuro: sensation is intact to touch on the feet.        Assessment & Plan:  Dm, uncertain control

## 2011-09-30 NOTE — Patient Instructions (Addendum)
blood tests are being ordered for you today.  please call [phone] to hear your test results.  You will be prompted to enter the 9-digit "MRN" number that appears at the top left of this page, followed by #.  Then you will hear the message. Please make a regular physical appointment in January. Refer to dr clance.  you will receive a phone call, about a day and time for an appointment.  i don't know if an alternative to coumadin would be good for you. Please ask him.

## 2011-10-01 LAB — LDL CHOLESTEROL, DIRECT: Direct LDL: 149.2 mg/dL

## 2011-10-02 LAB — HEMOGLOBIN A1C: Hgb A1c MFr Bld: 6.8 % — ABNORMAL HIGH (ref 4.6–6.5)

## 2011-10-09 ENCOUNTER — Encounter: Payer: Self-pay | Admitting: Endocrinology

## 2011-10-09 ENCOUNTER — Ambulatory Visit (INDEPENDENT_AMBULATORY_CARE_PROVIDER_SITE_OTHER): Payer: 59 | Admitting: Endocrinology

## 2011-10-09 DIAGNOSIS — Z0289 Encounter for other administrative examinations: Secondary | ICD-10-CM

## 2011-10-09 DIAGNOSIS — Z021 Encounter for pre-employment examination: Secondary | ICD-10-CM

## 2011-10-09 NOTE — Patient Instructions (Signed)
Medical form is done for you today.  Please return as scheduled.

## 2011-10-11 DIAGNOSIS — Z021 Encounter for pre-employment examination: Secondary | ICD-10-CM | POA: Insufficient documentation

## 2011-10-11 NOTE — Progress Notes (Signed)
Subjective:    Patient ID: Spencer English, male    DOB: Oct 10, 1966, 45 y.o.   MRN: [id number]  HPI Pt is here for cdl exam.  Please refer to the form. He takes coumadin, but no bleeding He takes dm meds, but no hypoglycemia For low-back pain, he seldom takes norco Past Medical History  Diagnosis Date  . DM 08/08/2008  . DYSLIPIDEMIA 04/26/2009  . GOUT 10/24/2010  . HYPERTENSION 07/21/2007  . PULMONARY EMBOLISM 05/03/2009  . DEEP VENOUS THROMBOPHLEBITIS, LEG, LEFT 05/05/2009  . ALLERGIC RHINITIS 04/12/2009  . UNSPECIFIED URINARY CALCULUS 07/04/2010  . 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 ECZEMA 05/23/2010  . Encounter for long-term (current) use of anticoagulants 04/03/2011  . BACK PAIN, LUMBAR 01/14/2011  . 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 Hyperglycemia   . Leukopenia     Past Surgical History  Procedure Date  . Anterior cruciate ligament repair 2000    Right    History   Social History  . Marital Status: Legally Separated    Spouse Name: N/A    Number of Children: N/A  . 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 Smoking status: Former Games developer  . Smokeless tobacco: Not on file  . Alcohol Use: No  . Drug Use: No  . Sexually Active:    Other Topics Concern  . Not on file   Social History Narrative  . No narrative on file    Current Outpatient Prescriptions on File Prior to Visit  Medication Sig Dispense Refill  . allopurinol (ZYLOPRIM) 300 MG tablet Take 300 mg by mouth as needed.        Marland Kitchen olmesartan (BENICAR) 40 MG tablet Take 40 mg by mouth daily.        . sitaGLIPtan (JANUVIA) 100 MG tablet Take 100 mg by mouth daily.        Marland Kitchen warfarin (COUMADIN) 5 MG tablet Use as directed by Anticoagulation Clinic.  45 tablet  0  . fexofenadine (ALLEGRA) 180 MG tablet Take 180 mg by mouth as needed.       . fluticasone (FLONASE) 50 MCG/ACT nasal spray Place 2 sprays into the nose daily.        . furosemide (LASIX) 40 MG tablet  Take 40 mg by mouth daily.        . halobetasol (ULTRAVATE) 0.05 % cream APPLY  CREAM TOPICALLY THREE TIMES DAILY AS NEEDED FOR RASH  50 g  2  . HYDROcodone-acetaminophen (NORCO) 10-325 MG per tablet Take 1 tablet by mouth every 4 (four) hours as needed. For pain       . rosuvastatin (CRESTOR) 40 MG tablet Take 40 mg by mouth daily.        . tadalafil (CIALIS) 20 MG tablet Take 20 mg by mouth as needed.        . triamcinolone (KENALOG) 0.025 % cream Apply topically 3 (three) times daily as needed. For itching       . verapamil (CALAN-SR) 240 MG CR tablet Take 240 mg by mouth daily.          Allergies  Allergen Reactions  . Metformin     REACTION: nausea/diarrhea    Family History  Problem Relation Age of Onset  . Heart disease Neg Hx     no premature CAD known    BP 130/92  Pulse 70  Temp(Src) 98.2 F (36.8 C) (Oral)  Ht 6' (1.829 m)  Wt 252 lb 6 oz (114.477 kg)  BMI 34.23 kg/m2  SpO2 96%     Review of Systems Denies chest pain and sob    Objective:   Physical Exam (see cdl form)     Assessment & Plan:  cld exam, mult probs evaluated

## 2011-10-13 ENCOUNTER — Ambulatory Visit: Payer: 59 | Admitting: Pulmonary Disease

## 2011-10-30 ENCOUNTER — Encounter: Payer: 59 | Admitting: *Deleted

## 2011-11-08 ENCOUNTER — Other Ambulatory Visit: Payer: Self-pay | Admitting: Endocrinology

## 2011-11-11 ENCOUNTER — Ambulatory Visit (INDEPENDENT_AMBULATORY_CARE_PROVIDER_SITE_OTHER): Payer: BC Managed Care – PPO | Admitting: *Deleted

## 2011-11-11 ENCOUNTER — Other Ambulatory Visit: Payer: Self-pay | Admitting: Endocrinology

## 2011-11-11 DIAGNOSIS — I2699 Other pulmonary embolism without acute cor pulmonale: Secondary | ICD-10-CM

## 2011-11-11 DIAGNOSIS — I82409 Acute embolism and thrombosis of unspecified deep veins of unspecified lower extremity: Secondary | ICD-10-CM

## 2011-11-11 DIAGNOSIS — Z7901 Long term (current) use of anticoagulants: Secondary | ICD-10-CM

## 2011-11-11 LAB — POCT INR: INR: 2.7

## 2011-11-11 MED ORDER — WARFARIN SODIUM 5 MG PO TABS: ORAL_TABLET | ORAL | Status: DC

## 2011-12-11 ENCOUNTER — Encounter: Payer: BC Managed Care – PPO | Admitting: *Deleted

## 2011-12-19 ENCOUNTER — Other Ambulatory Visit: Payer: Self-pay | Admitting: Cardiovascular Disease

## 2011-12-19 MED ORDER — WARFARIN SODIUM 5 MG PO TABS: ORAL_TABLET | ORAL | Status: DC

## 2011-12-24 ENCOUNTER — Encounter: Payer: BC Managed Care – PPO | Admitting: *Deleted

## 2012-01-06 ENCOUNTER — Encounter: Payer: 59 | Admitting: Endocrinology

## 2012-01-06 DIAGNOSIS — Z0289 Encounter for other administrative examinations: Secondary | ICD-10-CM

## 2012-01-11 ENCOUNTER — Other Ambulatory Visit: Payer: Self-pay | Admitting: Endocrinology

## 2012-01-24 ENCOUNTER — Other Ambulatory Visit: Payer: Self-pay | Admitting: Cardiology

## 2012-01-26 ENCOUNTER — Other Ambulatory Visit: Payer: Self-pay | Admitting: Endocrinology

## 2012-01-29 ENCOUNTER — Encounter: Payer: BC Managed Care – PPO | Admitting: *Deleted

## 2012-01-30 ENCOUNTER — Encounter: Payer: BC Managed Care – PPO | Admitting: *Deleted

## 2012-01-30 ENCOUNTER — Ambulatory Visit (INDEPENDENT_AMBULATORY_CARE_PROVIDER_SITE_OTHER): Payer: Self-pay

## 2012-01-30 DIAGNOSIS — Z7901 Long term (current) use of anticoagulants: Secondary | ICD-10-CM

## 2012-01-30 DIAGNOSIS — I2699 Other pulmonary embolism without acute cor pulmonale: Secondary | ICD-10-CM

## 2012-01-30 DIAGNOSIS — I82409 Acute embolism and thrombosis of unspecified deep veins of unspecified lower extremity: Secondary | ICD-10-CM

## 2012-01-30 LAB — POCT INR: INR: 1.3

## 2012-01-30 MED ORDER — WARFARIN SODIUM 5 MG PO TABS: ORAL_TABLET | ORAL | Status: DC

## 2012-02-13 ENCOUNTER — Encounter: Payer: Self-pay | Admitting: *Deleted

## 2012-04-01 ENCOUNTER — Emergency Department (HOSPITAL_COMMUNITY): Payer: No Typology Code available for payment source

## 2012-04-01 ENCOUNTER — Encounter (HOSPITAL_COMMUNITY): Payer: Self-pay

## 2012-04-01 ENCOUNTER — Emergency Department (HOSPITAL_COMMUNITY)
Admission: EM | Admit: 2012-04-01 | Discharge: 2012-04-02 | Disposition: A | Discharge status: Home / Self Care | Payer: No Typology Code available for payment source | Attending: Emergency Medicine | Admitting: Emergency Medicine

## 2012-04-01 DIAGNOSIS — R51 Headache: Secondary | ICD-10-CM

## 2012-04-01 DIAGNOSIS — M545 Low back pain, unspecified: Secondary | ICD-10-CM | POA: Insufficient documentation

## 2012-04-01 DIAGNOSIS — S335XXA Sprain of ligaments of lumbar spine, initial encounter: Secondary | ICD-10-CM | POA: Insufficient documentation

## 2012-04-01 DIAGNOSIS — Z86718 Personal history of other venous thrombosis and embolism: Secondary | ICD-10-CM | POA: Insufficient documentation

## 2012-04-01 DIAGNOSIS — S161XXA Strain of muscle, fascia and tendon at neck level, initial encounter: Secondary | ICD-10-CM

## 2012-04-01 DIAGNOSIS — M546 Pain in thoracic spine: Secondary | ICD-10-CM | POA: Insufficient documentation

## 2012-04-01 DIAGNOSIS — Z8639 Personal history of other endocrine, nutritional and metabolic disease: Secondary | ICD-10-CM | POA: Insufficient documentation

## 2012-04-01 DIAGNOSIS — S139XXA Sprain of joints and ligaments of unspecified parts of neck, initial encounter: Secondary | ICD-10-CM | POA: Insufficient documentation

## 2012-04-01 DIAGNOSIS — Z79899 Other long term (current) drug therapy: Secondary | ICD-10-CM | POA: Insufficient documentation

## 2012-04-01 DIAGNOSIS — R220 Localized swelling, mass and lump, head: Secondary | ICD-10-CM | POA: Insufficient documentation

## 2012-04-01 DIAGNOSIS — S39012A Strain of muscle, fascia and tendon of lower back, initial encounter: Secondary | ICD-10-CM

## 2012-04-01 DIAGNOSIS — E119 Type 2 diabetes mellitus without complications: Secondary | ICD-10-CM | POA: Insufficient documentation

## 2012-04-01 DIAGNOSIS — I10 Essential (primary) hypertension: Secondary | ICD-10-CM | POA: Insufficient documentation

## 2012-04-01 DIAGNOSIS — R079 Chest pain, unspecified: Secondary | ICD-10-CM | POA: Insufficient documentation

## 2012-04-01 DIAGNOSIS — Z7901 Long term (current) use of anticoagulants: Secondary | ICD-10-CM | POA: Insufficient documentation

## 2012-04-01 DIAGNOSIS — E785 Hyperlipidemia, unspecified: Secondary | ICD-10-CM | POA: Insufficient documentation

## 2012-04-01 DIAGNOSIS — Z862 Personal history of diseases of the blood and blood-forming organs and certain disorders involving the immune mechanism: Secondary | ICD-10-CM | POA: Insufficient documentation

## 2012-04-01 LAB — DIFFERENTIAL
Basophils Absolute: 0.1 10*3/uL (ref 0.0–0.1)
Basophils Relative: 1 % (ref 0–1)
Monocytes Relative: 9 % (ref 3–12)
Neutro Abs: 3.4 10*3/uL (ref 1.7–7.7)
Neutrophils Relative %: 60 % (ref 43–77)

## 2012-04-01 LAB — POCT I-STAT, CHEM 8
HCT: 49 % (ref 39.0–52.0)
Hemoglobin: 16.7 g/dL (ref 13.0–17.0)
Sodium: 142 mEq/L (ref 135–145)
TCO2: 27 mmol/L (ref 0–100)

## 2012-04-01 LAB — CBC
Hemoglobin: 14.5 g/dL (ref 13.0–17.0)
MCHC: 32.6 g/dL (ref 30.0–36.0)
RDW: 16.2 % — ABNORMAL HIGH (ref 11.5–15.5)

## 2012-04-01 LAB — PROTIME-INR
INR: 1.25 (ref 0.00–1.49)
Prothrombin Time: 16 seconds — ABNORMAL HIGH (ref 11.6–15.2)

## 2012-04-01 MED ORDER — OXYCODONE-ACETAMINOPHEN 5-325 MG PO TABS
1.0000 | ORAL_TABLET | Freq: Once | ORAL | Status: AC
  Administered 2012-04-01: 1 via ORAL
  Filled 2012-04-01: qty 1

## 2012-04-01 MED ORDER — DIAZEPAM 5 MG PO TABS
5.0000 mg | ORAL_TABLET | Freq: Once | ORAL | Status: AC
  Administered 2012-04-01: 5 mg via ORAL
  Filled 2012-04-01: qty 1

## 2012-04-01 NOTE — ED Notes (Signed)
Patient transported to X-ray. Will get labs when pt returns  
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
## 2012-04-02 MED ORDER — HYDROCODONE-ACETAMINOPHEN 5-500 MG PO TABS: 1.0000 | ORAL_TABLET | Freq: Four times a day (QID) | ORAL | Status: AC | PRN

## 2012-04-02 MED ORDER — DIAZEPAM 5 MG PO TABS: 5.0000 mg | ORAL_TABLET | Freq: Three times a day (TID) | ORAL | Status: AC | PRN
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
## 2012-04-05 ENCOUNTER — Emergency Department (HOSPITAL_COMMUNITY)
Admission: EM | Admit: 2012-04-05 | Discharge: 2012-04-05 | Disposition: A | Discharge status: Home / Self Care | Payer: No Typology Code available for payment source

## 2012-04-08 ENCOUNTER — Telehealth: Payer: Self-pay | Admitting: *Deleted
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
## 2012-04-09 NOTE — ED Provider Notes (Signed)
Medical screening examination/treatment/procedure(s) were performed by non-physician practitioner and as supervising physician I was immediately available for consultation/collaboration.    Newell Frater L Quin Mathenia, MD 04/09/12 1705 

## 2012-04-26 ENCOUNTER — Ambulatory Visit (INDEPENDENT_AMBULATORY_CARE_PROVIDER_SITE_OTHER): Payer: Self-pay

## 2012-04-26 DIAGNOSIS — I82409 Acute embolism and thrombosis of unspecified deep veins of unspecified lower extremity: Secondary | ICD-10-CM

## 2012-04-26 DIAGNOSIS — I2699 Other pulmonary embolism without acute cor pulmonale: Secondary | ICD-10-CM

## 2012-04-26 DIAGNOSIS — Z7901 Long term (current) use of anticoagulants: Secondary | ICD-10-CM

## 2012-04-26 LAB — POCT INR: INR: 1.2

## 2012-04-26 MED ORDER — WARFARIN SODIUM 5 MG PO TABS: ORAL_TABLET | ORAL | Status: DC

## 2012-06-24 ENCOUNTER — Other Ambulatory Visit: Payer: Self-pay | Admitting: Endocrinology

## 2012-07-26 ENCOUNTER — Other Ambulatory Visit: Payer: Self-pay | Admitting: Endocrinology

## 2012-08-24 ENCOUNTER — Ambulatory Visit: Payer: Self-pay | Admitting: Endocrinology

## 2012-08-31 ENCOUNTER — Ambulatory Visit: Payer: Self-pay | Admitting: Endocrinology

## 2012-09-01 ENCOUNTER — Encounter: Payer: Self-pay | Admitting: General Practice

## 2012-09-06 ENCOUNTER — Ambulatory Visit (INDEPENDENT_AMBULATORY_CARE_PROVIDER_SITE_OTHER): Payer: Self-pay

## 2012-09-06 ENCOUNTER — Encounter: Payer: Self-pay | Admitting: Endocrinology

## 2012-09-06 ENCOUNTER — Ambulatory Visit (INDEPENDENT_AMBULATORY_CARE_PROVIDER_SITE_OTHER): Payer: BC Managed Care – PPO | Admitting: Endocrinology

## 2012-09-06 VITALS — BP 140/88 | HR 116 | Temp 98.1°F | Resp 17 | Wt 266.4 lb

## 2012-09-06 DIAGNOSIS — M109 Gout, unspecified: Secondary | ICD-10-CM

## 2012-09-06 DIAGNOSIS — Z23 Encounter for immunization: Secondary | ICD-10-CM

## 2012-09-06 DIAGNOSIS — E119 Type 2 diabetes mellitus without complications: Secondary | ICD-10-CM

## 2012-09-06 DIAGNOSIS — I10 Essential (primary) hypertension: Secondary | ICD-10-CM

## 2012-09-06 DIAGNOSIS — I2699 Other pulmonary embolism without acute cor pulmonale: Secondary | ICD-10-CM

## 2012-09-06 DIAGNOSIS — Z0000 Encounter for general adult medical examination without abnormal findings: Secondary | ICD-10-CM | POA: Insufficient documentation

## 2012-09-06 DIAGNOSIS — Z7901 Long term (current) use of anticoagulants: Secondary | ICD-10-CM

## 2012-09-06 DIAGNOSIS — I82409 Acute embolism and thrombosis of unspecified deep veins of unspecified lower extremity: Secondary | ICD-10-CM

## 2012-09-06 DIAGNOSIS — Z79899 Other long term (current) drug therapy: Secondary | ICD-10-CM | POA: Insufficient documentation

## 2012-09-06 DIAGNOSIS — N529 Male erectile dysfunction, unspecified: Secondary | ICD-10-CM

## 2012-09-06 DIAGNOSIS — E785 Hyperlipidemia, unspecified: Secondary | ICD-10-CM

## 2012-09-06 LAB — POCT INR: INR: 1.4

## 2012-09-06 MED ORDER — ALLOPURINOL 300 MG PO TABS: 300.0000 mg | ORAL_TABLET | ORAL | Status: DC | PRN

## 2012-09-06 MED ORDER — WARFARIN SODIUM 5 MG PO TABS: ORAL_TABLET | ORAL | Status: DC

## 2012-09-06 MED ORDER — VERAPAMIL HCL ER 240 MG PO TBCR: 240.0000 mg | EXTENDED_RELEASE_TABLET | Freq: Every day | ORAL | Status: DC

## 2012-09-06 MED ORDER — TADALAFIL 5 MG PO TABS: 5.0000 mg | ORAL_TABLET | Freq: Every day | ORAL | Status: DC | PRN

## 2012-09-06 MED ORDER — ATORVASTATIN CALCIUM 20 MG PO TABS: 20.0000 mg | ORAL_TABLET | Freq: Every day | ORAL | Status: DC

## 2012-09-06 NOTE — Patient Instructions (Addendum)
blood tests are being requested for you today.  You will receive a letter with results.   Please make a regular physical appointment soon.   i have sent prescriptions to your pharmacy.

## 2012-09-06 NOTE — Progress Notes (Signed)
Subjective:    Patient ID: Spencer English, male    DOB: 07-Jun-1966, 46 y.o.   MRN: [id number]  HPI The state of at least three ongoing medical problems is addressed today: Dyslipidemia: Pt says he did not tolerate zocor, due to memory loss. DM: he denies weight loss.   HTN: he denies chest pain. Past Medical History  Diagnosis Date  . DM 08/08/2008  . DYSLIPIDEMIA 04/26/2009  . GOUT 10/24/2010  . HYPERTENSION 07/21/2007  . PULMONARY EMBOLISM 05/03/2009  . DEEP VENOUS THROMBOPHLEBITIS, LEG, LEFT 05/05/2009  . ALLERGIC RHINITIS 04/12/2009  . UNSPECIFIED URINARY CALCULUS 07/04/2010  . 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 ECZEMA 05/23/2010  . Encounter for long-term (current) use of anticoagulants 04/03/2011  . BACK PAIN, LUMBAR 01/14/2011  . 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 Hyperglycemia   . Leukopenia     Past Surgical History  Procedure Date  . Anterior cruciate ligament repair 2000    Right    History   Social History  . Marital Status: Legally Separated    Spouse Name: N/A    Number of Children: N/A  . 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 Smoking status: Former Games developer  . Smokeless tobacco: Not on file  . Alcohol Use: No  . Drug Use: No  . Sexually Active:    Other Topics Concern  . Not on file   Social History Narrative  . No narrative on file    Current Outpatient Prescriptions on File Prior to Visit  Medication Sig Dispense Refill  . acetaminophen (TYLENOL) 500 MG tablet Take 1,000 mg by mouth every 6 (six) hours as needed. For pain.      Marland Kitchen allopurinol (ZYLOPRIM) 300 MG tablet Take 1 tablet (300 mg total) by mouth as needed. For gout flare-up.  30 tablet  11  . fexofenadine (ALLEGRA) 180 MG tablet Take 180 mg by mouth daily as needed. For allergies.      . fluticasone (FLONASE) 50 MCG/ACT nasal spray Place 2 sprays into the nose daily as needed. For allergies.      . furosemide (LASIX) 40 MG tablet Take 1  tablet (40 mg total) by mouth daily.  30 tablet  5  . halobetasol (ULTRAVATE) 0.05 % cream APPLY  CREAM TOPICALLY THREE TIMES DAILY AS NEEDED FOR RASH  50 g  2  . Multiple Vitamin (MULITIVITAMIN WITH MINERALS) TABS Take 1 tablet by mouth daily.      Marland Kitchen olmesartan (BENICAR) 40 MG tablet Take 40 mg by mouth daily.        . sitaGLIPtan (JANUVIA) 100 MG tablet Take 100 mg by mouth daily.        . verapamil (CALAN-SR) 240 MG CR tablet Take 1 tablet (240 mg total) by mouth at bedtime.  30 tablet  10  . atorvastatin (LIPITOR) 20 MG tablet Take 1 tablet (20 mg total) by mouth daily.  90 tablet  3  . tadalafil (CIALIS) 5 MG tablet Take 1 tablet (5 mg total) by mouth daily as needed for erectile dysfunction.  30 tablet  0    Allergies  Allergen Reactions  . Metformin     REACTION: nausea/diarrhea    Family History  Problem Relation Age of Onset  . Heart disease Neg Hx     no premature CAD known   BP 140/88  Pulse 116  Temp 98.1 F (36.7 C) (Oral)  Resp 17  Wt  266 lb 6 oz (120.827 kg)  SpO2 96%  Review of Systems Denies SOB and rash    Objective:   Physical Exam VITAL SIGNS:  See vs page GENERAL: no distress Ext: no edema     Assessment & Plan:  DM, uncertain control HTN, with ? Of situational component Dyslipidemia: uncertain control

## 2012-09-19 ENCOUNTER — Emergency Department (HOSPITAL_BASED_OUTPATIENT_CLINIC_OR_DEPARTMENT_OTHER): Payer: BC Managed Care – PPO

## 2012-09-19 ENCOUNTER — Encounter (HOSPITAL_BASED_OUTPATIENT_CLINIC_OR_DEPARTMENT_OTHER): Payer: Self-pay | Admitting: Family Medicine

## 2012-09-19 ENCOUNTER — Emergency Department (HOSPITAL_BASED_OUTPATIENT_CLINIC_OR_DEPARTMENT_OTHER)
Admission: EM | Admit: 2012-09-19 | Discharge: 2012-09-19 | Disposition: A | Discharge status: Home / Self Care | Payer: BC Managed Care – PPO | Attending: Emergency Medicine | Admitting: Emergency Medicine

## 2012-09-19 DIAGNOSIS — R0989 Other specified symptoms and signs involving the circulatory and respiratory systems: Secondary | ICD-10-CM | POA: Insufficient documentation

## 2012-09-19 DIAGNOSIS — R0981 Nasal congestion: Secondary | ICD-10-CM

## 2012-09-19 DIAGNOSIS — Z79899 Other long term (current) drug therapy: Secondary | ICD-10-CM | POA: Insufficient documentation

## 2012-09-19 DIAGNOSIS — I10 Essential (primary) hypertension: Secondary | ICD-10-CM | POA: Insufficient documentation

## 2012-09-19 DIAGNOSIS — J3489 Other specified disorders of nose and nasal sinuses: Secondary | ICD-10-CM | POA: Insufficient documentation

## 2012-09-19 DIAGNOSIS — R0600 Dyspnea, unspecified: Secondary | ICD-10-CM

## 2012-09-19 DIAGNOSIS — R0609 Other forms of dyspnea: Secondary | ICD-10-CM | POA: Insufficient documentation

## 2012-09-19 DIAGNOSIS — Z7901 Long term (current) use of anticoagulants: Secondary | ICD-10-CM | POA: Insufficient documentation

## 2012-09-19 LAB — BASIC METABOLIC PANEL
BUN: 11 mg/dL (ref 6–23)
Calcium: 9.3 mg/dL (ref 8.4–10.5)
GFR calc non Af Amer: >90 mL/min (ref >90)
Glucose, Bld: 136 mg/dL — ABNORMAL HIGH (ref 70–99)
Sodium: 138 mEq/L (ref 135–145)

## 2012-09-19 LAB — CBC WITH DIFFERENTIAL/PLATELET
Eosinophils Absolute: 0.2 10*3/uL (ref 0.0–0.7)
Eosinophils Relative: 5 % (ref 0–5)
Lymphs Abs: 1.1 10*3/uL (ref 0.7–4.0)
MCH: 26.3 pg (ref 26.0–34.0)
MCHC: 33.3 g/dL (ref 30.0–36.0)
MCV: 79.2 fL (ref 78.0–100.0)
Platelets: 188 10*3/uL (ref 150–400)
RBC: 5.66 MIL/uL (ref 4.22–5.81)

## 2012-09-19 LAB — PROTIME-INR: INR: 2.94 — ABNORMAL HIGH (ref 0.00–1.49)

## 2012-09-19 MED ORDER — FLUTICASONE PROPIONATE 50 MCG/ACT NA SUSP: 2.0000 | Freq: Every day | NASAL | Status: DC

## 2012-09-19 MED ORDER — IOHEXOL 350 MG/ML SOLN
100.0000 mL | Freq: Once | INTRAVENOUS | Status: AC | PRN
  Administered 2012-09-19: 100 mL via INTRAVENOUS

## 2012-09-19 MED ORDER — SODIUM CHLORIDE 0.9 % IV BOLUS (SEPSIS)
1000.0000 mL | Freq: Once | INTRAVENOUS | Status: AC
  Administered 2012-09-19: 1000 mL via INTRAVENOUS

## 2012-09-19 NOTE — ED Notes (Signed)
Pt ready for transport

## 2012-09-19 NOTE — ED Provider Notes (Signed)
History     CSN: [id number]  Arrival date & time 09/19/12  1024   First MD Initiated Contact with Patient 09/19/12 1030      Chief Complaint  Patient presents with  . 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 Reports about 3 weeks of moderate SOB, worse with exertion associated with heart racing but no CP. He reports he has been taking his coumadin but his last several INR checks available in [facility] have been subtherapeutic going back to Feb 13.   Past Medical History  Diagnosis Date  . DM 08/08/2008  . DYSLIPIDEMIA 04/26/2009  . GOUT 10/24/2010  . HYPERTENSION 07/21/2007  . PULMONARY EMBOLISM 05/03/2009  . DEEP VENOUS THROMBOPHLEBITIS, LEG, LEFT 05/05/2009  . ALLERGIC RHINITIS 04/12/2009  . UNSPECIFIED URINARY CALCULUS 07/04/2010  . 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 ECZEMA 05/23/2010  . Encounter for long-term (current) use of anticoagulants 04/03/2011  . BACK PAIN, LUMBAR 01/14/2011  . 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 Hyperglycemia   . Leukopenia     Past Surgical History  Procedure Date  . Anterior cruciate ligament repair 2000    Right    Family History  Problem Relation Age of Onset  . Heart disease Neg Hx     no premature CAD known  . Diabetes Mother   . Hypertension Mother   . Hyperlipidemia Mother   . Diabetes Father   . Hypertension Father   . Hyperlipidemia Father     History  Substance Use Topics  . Smoking status: Former Games developer  . Smokeless tobacco: Not on file  . Alcohol Use: Yes     Occ.      Review of Systems All other systems reviewed and are negative except as noted in HPI.   Allergies  Metformin  Home Medications   Current Outpatient Rx  Name Route Sig Dispense Refill  . ACETAMINOPHEN 500 MG PO TABS Oral Take 1,000 mg by mouth every 6 (six) hours  as needed. For pain.    Marland Kitchen ALLOPURINOL 300 MG PO TABS Oral Take 1 tablet (300 mg total) by mouth as needed. For gout flare-up. 30 tablet 11  . ATORVASTATIN CALCIUM 20 MG PO TABS Oral Take 1 tablet (20 mg total) by mouth daily. 90 tablet 3  . FEXOFENADINE HCL 180 MG PO TABS Oral Take 180 mg by mouth daily as needed. For allergies.    Marland Kitchen FLUTICASONE PROPIONATE 50 MCG/ACT NA SUSP Nasal Place 2 sprays into the nose daily as needed. For allergies.    . FUROSEMIDE 40 MG PO TABS Oral Take 1 tablet (40 mg total) by mouth daily. 30 tablet 5  . HALOBETASOL PROPIONATE 0.05 % EX CREA  APPLY  CREAM TOPICALLY THREE TIMES DAILY AS NEEDED FOR RASH 50 g 2  . ADULT MULTIVITAMIN W/MINERALS CH Oral Take 1 tablet by mouth daily.    Marland Kitchen OLMESARTAN MEDOXOMIL 40 MG PO TABS Oral Take 40 mg by mouth daily.      Marland Kitchen SITAGLIPTIN PHOSPHATE 100 MG PO TABS Oral Take 100 mg by mouth daily.      Marland Kitchen TADALAFIL 5 MG PO TABS Oral Take 1 tablet (5 mg total) by mouth daily as needed for erectile dysfunction. 30 tablet 0  . VERAPAMIL HCL ER 240 MG PO TBCR Oral Take 1 tablet (240 mg total) by mouth at bedtime. 30 tablet 10  .  WARFARIN SODIUM 5 MG PO TABS  Take as directed by anticoagulation 50 tablet 1    BP 138/87  Pulse 110  Temp 98.3 F (36.8 C) (Oral)  Resp 18  Ht 6' (1.829 m)  Wt 264 lb (119.75 kg)  BMI 35.80 kg/m2  SpO2 100%  Physical Exam  Nursing note and vitals reviewed. Constitutional: He is oriented to person, place, and time. He appears well-developed and well-nourished.  HENT:  Head: Normocephalic and atraumatic.  Eyes: EOM are normal. Pupils are equal, round, and reactive to light.  Neck: Normal range of motion. Neck supple.  Cardiovascular: Normal heart sounds and intact distal pulses.        tachycardia  Pulmonary/Chest: Effort normal and breath sounds normal.  Abdominal: Bowel sounds are normal. He exhibits no distension. There is no tenderness.  Musculoskeletal: Normal range of motion. He exhibits no edema and  no tenderness.  Neurological: He is alert and oriented to person, place, and time. He has normal strength. No cranial nerve deficit or sensory deficit.  Skin: Skin is warm and dry. No rash noted.  Psychiatric: He has a normal mood and affect.    ED Course  Procedures (including critical care time)  Labs Reviewed  CBC WITH DIFFERENTIAL - Abnormal; Notable for the following:    WBC 3.7 (*)     Neutrophils Relative 42 (*)     Neutro Abs 1.5 (*)     Monocytes Relative 23 (*)     All other components within normal limits  BASIC METABOLIC PANEL - Abnormal; Notable for the following:    Glucose, Bld 136 (*)     All other components within normal limits  PROTIME-INR - Abnormal; Notable for the following:    Prothrombin Time 29.1 (*)     INR 2.94 (*)     All other components within normal limits  TROPONIN I   Ct Angio Chest Pe W/cm &/or Wo Cm  09/19/2012  *RADIOLOGY REPORT*  Clinical Data: Shortness of breath.  Tachycardia.  Evaluate for pulmonary embolism.  CT ANGIOGRAPHY CHEST  Technique:  Multidetector CT imaging of the chest using the standard protocol during bolus administration of intravenous contrast. Multiplanar reconstructed images including MIPs were obtained and reviewed to evaluate the vascular anatomy.  Contrast: OMNIPAQUE IOHEXOL 350 MG/ML SOLN  Comparison: Chest CT 04/27/2009.  Findings:  Mediastinum: There are no filling defects within the pulmonary arterial tree to suggest underlying pulmonary embolism. Heart size is mildly enlarged. There is atherosclerosis of the thoracic aorta, the great vessels of the mediastinum and the coronary arteries, including calcified atherosclerotic plaque in the left anterior descending and left circumflex coronary arteries. There is no significant pericardial fluid, thickening or pericardial calcification. No pathologically enlarged mediastinal or hilar lymph nodes. Esophagus is unremarkable in appearance.  Lungs/Pleura: No acute consolidative  airspace disease.  No pleural effusions.  No definite suspicious appearing pulmonary nodules or masses are identified.  Upper Abdomen: Unremarkable.  Musculoskeletal: There are no aggressive appearing lytic or blastic lesions noted in the visualized portions of the skeleton.  IMPRESSION: 1.  No evidence of pulmonary embolism. 2.  No acute findings in the thorax to account for patient's symptoms. 3. Atherosclerosis, including left anterior descending and left circumflex coronary artery disease. Please note that although the presence of coronary artery calcium documents the presence of coronary artery disease, the severity of this disease and any potential stenosis cannot be assessed on this non-gated CT examination.  Assessment for potential risk factor modification,  dietary therapy or pharmacologic therapy may be warranted, if clinically indicated. 4.  Mild cardiomegaly.   Original Report Authenticated By: Florencia Reasons, M.D.      No diagnosis found.    MDM   Date: 09/19/2012  Rate: 102  Rhythm: sinus tachycardia  QRS Axis: normal  Intervals: normal  ST/T Wave abnormalities: normal  Conduction Disutrbances:none  Narrative Interpretation: LVH  Old EKG Reviewed: unchanged  1:15 PM CT neg for PE. INR is therapeutic. Pt mostly concerned now about nasal congestion ongoing for 2 days. Requesting Flonase Rx. Will also use OTC Allegra. PCP followup.         Chett Taniguchi B. Bernette Mayers, MD 09/19/12 1316

## 2012-09-19 NOTE — ED Notes (Signed)
Pt. C/o heart "racing" for the past 1.5 weeks and SOB beginning today.  Pt. Reports hx of PE.  Pt. Denies CP.

## 2012-10-18 ENCOUNTER — Encounter: Payer: BC Managed Care – PPO | Admitting: Endocrinology

## 2012-10-26 ENCOUNTER — Encounter: Payer: BC Managed Care – PPO | Admitting: Endocrinology

## 2012-11-03 ENCOUNTER — Other Ambulatory Visit: Payer: Self-pay | Admitting: Endocrinology

## 2012-11-03 ENCOUNTER — Telehealth: Payer: Self-pay | Admitting: Endocrinology

## 2012-11-03 DIAGNOSIS — R002 Palpitations: Secondary | ICD-10-CM

## 2012-11-03 NOTE — Telephone Encounter (Signed)
Pt would like referral to heart care for his rapid heart beat and possible echo. CPE scheduled for Wednesday, 11/10/2012. Advised to go to [place] for labs.

## 2012-11-03 NOTE — Telephone Encounter (Signed)
done

## 2012-11-03 NOTE — Telephone Encounter (Signed)
Please advise 

## 2012-11-05 ENCOUNTER — Encounter: Payer: BC Managed Care – PPO | Admitting: Endocrinology

## 2012-11-10 ENCOUNTER — Encounter: Payer: BC Managed Care – PPO | Admitting: Endocrinology

## 2012-11-12 ENCOUNTER — Ambulatory Visit (INDEPENDENT_AMBULATORY_CARE_PROVIDER_SITE_OTHER): Payer: BC Managed Care – PPO | Admitting: Cardiology

## 2012-11-12 ENCOUNTER — Encounter: Payer: Self-pay | Admitting: Cardiology

## 2012-11-12 ENCOUNTER — Encounter (INDEPENDENT_AMBULATORY_CARE_PROVIDER_SITE_OTHER): Payer: BC Managed Care – PPO

## 2012-11-12 ENCOUNTER — Ambulatory Visit (INDEPENDENT_AMBULATORY_CARE_PROVIDER_SITE_OTHER): Payer: BC Managed Care – PPO

## 2012-11-12 VITALS — BP 146/91 | HR 99 | Ht 72.0 in | Wt 259.4 lb

## 2012-11-12 DIAGNOSIS — Z7901 Long term (current) use of anticoagulants: Secondary | ICD-10-CM

## 2012-11-12 DIAGNOSIS — R609 Edema, unspecified: Secondary | ICD-10-CM

## 2012-11-12 DIAGNOSIS — R0989 Other specified symptoms and signs involving the circulatory and respiratory systems: Secondary | ICD-10-CM

## 2012-11-12 DIAGNOSIS — I10 Essential (primary) hypertension: Secondary | ICD-10-CM

## 2012-11-12 DIAGNOSIS — E785 Hyperlipidemia, unspecified: Secondary | ICD-10-CM

## 2012-11-12 DIAGNOSIS — R002 Palpitations: Secondary | ICD-10-CM

## 2012-11-12 DIAGNOSIS — E119 Type 2 diabetes mellitus without complications: Secondary | ICD-10-CM

## 2012-11-12 DIAGNOSIS — I82409 Acute embolism and thrombosis of unspecified deep veins of unspecified lower extremity: Secondary | ICD-10-CM

## 2012-11-12 DIAGNOSIS — I2699 Other pulmonary embolism without acute cor pulmonale: Secondary | ICD-10-CM

## 2012-11-12 NOTE — Patient Instructions (Signed)
We will have you wear an event monitor.  We will schedule you for an echocardiogram  Avoid caffeine, salt, and follow a diabetic diet.

## 2012-11-12 NOTE — Progress Notes (Signed)
Spencer English Date of Birth: 02-Jan-1966 Medical Record #[id number]  History of Present Illness: Spencer English is a 46 year old black male seems request of Dr. Bernette Mayers. for evaluation of palpitations and edema. He reports that over the past 2 months he has had episodes of rapid heartbeat. He describes this as a racing, flutter, or pounding in his chest. It comes and goes. It is now occurring on a daily basis. Sometimes it is worse when he is more active than other times he notes that when he is in bed at night. There are no clear triggers. He does drink a lot of caffeine. He denies any use of decongestants or tobacco. He denies any significant chest pain or shortness of breath. He has had bilateral lower extremity edema. It is worse on the left. 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 He was seen in the emergency department in September with these symptoms. ECG at that time showed sinus tachycardia with a rate of 102 beats per minute. It is otherwise normal. Laboratory data was normal. This included CBC and chemistries and troponin. A CT of the chest showed no evidence of pulmonary embolus.  Current Outpatient Prescriptions on File Prior to Visit  Medication Sig Dispense Refill  . acetaminophen (TYLENOL) 500 MG tablet Take 1,000 mg by mouth every 6 (six) hours as needed. For pain.      Marland Kitchen allopurinol (ZYLOPRIM) 300 MG tablet Take 1 tablet (300 mg total) by mouth as needed. For gout flare-up.  30 tablet  11  . atorvastatin (LIPITOR) 20 MG tablet Take 1 tablet (20 mg total) by mouth daily.  90 tablet  3  . fexofenadine (ALLEGRA) 180 MG tablet Take 180 mg by mouth daily as needed. For allergies.      . fluticasone (FLONASE) 50 MCG/ACT nasal spray Place 2 sprays into the nose daily as needed. For allergies.      . fluticasone (FLONASE) 50 MCG/ACT nasal spray Place 2 sprays into the nose daily.  16 g  2  . furosemide (LASIX) 40 MG tablet  Take 1 tablet (40 mg total) by mouth daily.  30 tablet  5  . halobetasol (ULTRAVATE) 0.05 % cream APPLY  CREAM TOPICALLY THREE TIMES DAILY AS NEEDED FOR RASH  50 g  2  . JANUVIA 100 MG tablet TAKE ONE TABLET BY MOUTH EVERY DAY  30 tablet  0  . Multiple Vitamin (MULITIVITAMIN WITH MINERALS) TABS Take 1 tablet by mouth daily.      Marland Kitchen olmesartan (BENICAR) 40 MG tablet Take 40 mg by mouth daily.        . verapamil (CALAN-SR) 240 MG CR tablet Take 1 tablet (240 mg total) by mouth at bedtime.  30 tablet  10  . warfarin (COUMADIN) 5 MG tablet Take as directed by anticoagulation  50 tablet  1  . tadalafil (CIALIS) 5 MG tablet Take 1 tablet (5 mg total) by mouth daily as needed for erectile dysfunction.  30 tablet  0    Allergies  Allergen Reactions  . Metformin     REACTION: nausea/diarrhea    Past Medical History  Diagnosis Date  . DM 08/08/2008  . DYSLIPIDEMIA 04/26/2009  . GOUT 10/24/2010  . HYPERTENSION 07/21/2007  . PULMONARY EMBOLISM 05/03/2009  . DEEP VENOUS THROMBOPHLEBITIS, LEG, LEFT 05/05/2009  . ALLERGIC RHINITIS 04/12/2009  . UNSPECIFIED URINARY CALCULUS 07/04/2010  . 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 ECZEMA 05/23/2010  .  Long term (current) use of anticoagulants 04/03/2011  . BACK PAIN, LUMBAR 01/14/2011  . 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 Hyperglycemia   . Leukopenia     Past Surgical History  Procedure Date  . Anterior cruciate ligament repair 2000    Right    History  Smoking status  . Former Smoker  Smokeless tobacco  . Not on file    History  Alcohol Use  . Yes    Comment: Occ.    Family History  Problem Relation Age of Onset  . Heart disease Neg Hx     no premature CAD known  . Diabetes Mother   . Hypertension Mother   . Hyperlipidemia Mother   . Diabetes Father   . Hypertension Father   . Hyperlipidemia Father     Review of Systems: As noted in history of present illness. He does note some increased urinary frequency. He admits that he eats a lot of  sweets and drinks 4-5 sodas a day.  All other systems were reviewed and are negative.  Physical Exam: BP 146/91  Pulse 99  Ht 6' (1.829 m)  Wt 117.663 kg (259 lb 6.4 oz)  BMI 35.18 kg/m2  SpO2 99% He is an obese black male in no acute distress. HEENT exam is unremarkable. Pupils equal round and reactive to light accommodation. Sclera clear. Oropharynx clear. Neck is without JVD, adenopathy, directly, or bruits. Lungs are clear. Cardiac exam reveals a regular rate and rhythm without gallop, murmur, or click. Abdomen is obese, soft, nontender. No masses. Extremities reveal 1-2+ pretibial edema bilaterally. Pedal pulses are good. Skin is warm and dry. He is alert and oriented x3. Cranial nerves II through XII are intact. LABORATORY DATA: ECG was reviewed. This shows sinus tachycardia with mild voltage criteria for LVH. Otherwise normal.  Assessment / Plan: 1. Palpitations. Need to rule out primary arrhythmia. We will have the patient wear an event monitor to try and document. I recommended avoidance of caffeine and decongestants.  2. Lower extremity edema. I suspect this is related to his venous disease with prior DVT. We will obtain an echocardiogram to rule out LV dysfunction, RV dysfunction, or pulmonary hypertension.  3. Diabetes mellitus type 2. I stressed the importance of adhering with a carbohydrate modified diet and eliminating sweets and sodas from his diet.  4. Hypertension, controlled.  5. History of pulmonary embolus with DVT. He is on chronic Coumadin therapy. He is due to have his INR checked in her Coumadin clinic today.

## 2012-11-22 ENCOUNTER — Ambulatory Visit (HOSPITAL_COMMUNITY): Payer: BC Managed Care – PPO | Attending: Endocrinology | Admitting: Radiology

## 2012-11-22 ENCOUNTER — Telehealth: Payer: Self-pay

## 2012-11-22 DIAGNOSIS — R079 Chest pain, unspecified: Secondary | ICD-10-CM | POA: Insufficient documentation

## 2012-11-22 DIAGNOSIS — R002 Palpitations: Secondary | ICD-10-CM

## 2012-11-22 DIAGNOSIS — Z7901 Long term (current) use of anticoagulants: Secondary | ICD-10-CM

## 2012-11-22 DIAGNOSIS — I10 Essential (primary) hypertension: Secondary | ICD-10-CM

## 2012-11-22 DIAGNOSIS — I079 Rheumatic tricuspid valve disease, unspecified: Secondary | ICD-10-CM | POA: Insufficient documentation

## 2012-11-22 DIAGNOSIS — E785 Hyperlipidemia, unspecified: Secondary | ICD-10-CM | POA: Insufficient documentation

## 2012-11-22 DIAGNOSIS — E119 Type 2 diabetes mellitus without complications: Secondary | ICD-10-CM | POA: Insufficient documentation

## 2012-11-22 MED ORDER — VERAPAMIL HCL ER 240 MG PO TBCR: 240.0000 mg | EXTENDED_RELEASE_TABLET | Freq: Every day | ORAL | Status: DC

## 2012-11-22 NOTE — Telephone Encounter (Signed)
Patient called was told received ecardio monitor strips which revealed atrial fib rate 180 beats/min.Patient stated he has not been taking verapamil for about a month.Stated he stopped because he didn't think it was helping.Advised to restart verapamil 240 mg daily.Will let Dr.Jordan know .

## 2012-11-22 NOTE — Progress Notes (Signed)
Echocardiogram performed.  

## 2012-11-24 ENCOUNTER — Telehealth: Payer: Self-pay

## 2012-11-24 MED ORDER — METOPROLOL TARTRATE 25 MG PO TABS: 25.0000 mg | ORAL_TABLET | Freq: Two times a day (BID) | ORAL | Status: DC

## 2012-11-24 NOTE — Telephone Encounter (Signed)
Spoke to patient was told received message from Dr.Jordan he advised if verapamil dont help, he prefers metoprolol 25 mg twice a day.Pateint stated when he finishes verapamil he will start metoprolol.

## 2012-12-02 ENCOUNTER — Emergency Department (HOSPITAL_BASED_OUTPATIENT_CLINIC_OR_DEPARTMENT_OTHER)
Admission: EM | Admit: 2012-12-02 | Discharge: 2012-12-02 | Disposition: A | Discharge status: Home / Self Care | Payer: BC Managed Care – PPO | Attending: Emergency Medicine | Admitting: Emergency Medicine

## 2012-12-02 ENCOUNTER — Encounter (HOSPITAL_BASED_OUTPATIENT_CLINIC_OR_DEPARTMENT_OTHER): Payer: Self-pay | Admitting: *Deleted

## 2012-12-02 DIAGNOSIS — Z79899 Other long term (current) drug therapy: Secondary | ICD-10-CM | POA: Insufficient documentation

## 2012-12-02 DIAGNOSIS — Z87891 Personal history of nicotine dependence: Secondary | ICD-10-CM | POA: Insufficient documentation

## 2012-12-02 DIAGNOSIS — R319 Hematuria, unspecified: Secondary | ICD-10-CM

## 2012-12-02 DIAGNOSIS — I10 Essential (primary) hypertension: Secondary | ICD-10-CM | POA: Insufficient documentation

## 2012-12-02 DIAGNOSIS — Z86711 Personal history of pulmonary embolism: Secondary | ICD-10-CM | POA: Insufficient documentation

## 2012-12-02 DIAGNOSIS — D689 Coagulation defect, unspecified: Secondary | ICD-10-CM | POA: Insufficient documentation

## 2012-12-02 DIAGNOSIS — R310 Gross hematuria: Secondary | ICD-10-CM | POA: Insufficient documentation

## 2012-12-02 DIAGNOSIS — E119 Type 2 diabetes mellitus without complications: Secondary | ICD-10-CM | POA: Insufficient documentation

## 2012-12-02 DIAGNOSIS — Z87442 Personal history of urinary calculi: Secondary | ICD-10-CM | POA: Insufficient documentation

## 2012-12-02 DIAGNOSIS — T45515A Adverse effect of anticoagulants, initial encounter: Secondary | ICD-10-CM

## 2012-12-02 DIAGNOSIS — Z8639 Personal history of other endocrine, nutritional and metabolic disease: Secondary | ICD-10-CM | POA: Insufficient documentation

## 2012-12-02 DIAGNOSIS — Z862 Personal history of diseases of the blood and blood-forming organs and certain disorders involving the immune mechanism: Secondary | ICD-10-CM | POA: Insufficient documentation

## 2012-12-02 DIAGNOSIS — Z87448 Personal history of other diseases of urinary system: Secondary | ICD-10-CM | POA: Insufficient documentation

## 2012-12-02 DIAGNOSIS — Z86718 Personal history of other venous thrombosis and embolism: Secondary | ICD-10-CM | POA: Insufficient documentation

## 2012-12-02 DIAGNOSIS — D6832 Hemorrhagic disorder due to extrinsic circulating anticoagulants: Secondary | ICD-10-CM

## 2012-12-02 DIAGNOSIS — Z7901 Long term (current) use of anticoagulants: Secondary | ICD-10-CM | POA: Insufficient documentation

## 2012-12-02 LAB — CBC WITH DIFFERENTIAL/PLATELET
Eosinophils Relative: 6 % — ABNORMAL HIGH (ref 0–5)
HCT: 35.1 % — ABNORMAL LOW (ref 39.0–52.0)
Hemoglobin: 11.6 g/dL — ABNORMAL LOW (ref 13.0–17.0)
Lymphocytes Relative: 27 % (ref 12–46)
Lymphs Abs: 1.5 10*3/uL (ref 0.7–4.0)
MCV: 75 fL — ABNORMAL LOW (ref 78.0–100.0)
Monocytes Absolute: 1 10*3/uL (ref 0.1–1.0)
RBC: 4.68 MIL/uL (ref 4.22–5.81)
WBC: 5.8 10*3/uL (ref 4.0–10.5)

## 2012-12-02 LAB — URINE MICROSCOPIC-ADD ON

## 2012-12-02 LAB — URINALYSIS, ROUTINE W REFLEX MICROSCOPIC
Glucose, UA: NEGATIVE mg/dL
Specific Gravity, Urine: 1.014 (ref 1.005–1.030)

## 2012-12-02 MED ORDER — CEPHALEXIN 500 MG PO CAPS: 500.0000 mg | ORAL_CAPSULE | Freq: Four times a day (QID) | ORAL | Status: DC

## 2012-12-02 NOTE — ED Provider Notes (Signed)
History     CSN: [id number]  Arrival date & time 12/02/12  2037   First MD Initiated Contact with Patient 12/02/12 2046      Chief Complaint  Patient presents with  . Hematuria     Patient is a 46 y.o. male presenting with hematuria. The history is provided by the patient.  Hematuria This is a new problem. The current episode started today. The problem is unchanged. He describes the hematuria as gross hematuria. The hematuria occurs during the initial portion of his urinary stream. He reports no clotting in his urine stream. He is experiencing no pain. He describes his urine color as dark red. Irritative symptoms do not include frequency, nocturia or urgency. Obstructive symptoms do not include dribbling, incomplete emptying, an intermittent stream, a slower stream, straining or a weak stream. Pertinent negatives include no abdominal pain, chills, dysuria, fever, flank pain, genital pain, inability to urinate or vomiting.  pt denies trauma/fall/back pain No weakness No fever Reports h/o kidney stones but no urologic surgeries reported  Past Medical History  Diagnosis Date  . DM 08/08/2008  . DYSLIPIDEMIA 04/26/2009  . GOUT 10/24/2010  . HYPERTENSION 07/21/2007  . PULMONARY EMBOLISM 05/03/2009  . DEEP VENOUS THROMBOPHLEBITIS, LEG, LEFT 05/05/2009  . ALLERGIC RHINITIS 04/12/2009  . UNSPECIFIED URINARY CALCULUS 07/04/2010  . 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 ECZEMA 05/23/2010  . Long term (current) use of anticoagulants 04/03/2011  . BACK PAIN, LUMBAR 01/14/2011  . 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 Hyperglycemia   . Leukopenia     Past Surgical History  Procedure Date  . Anterior cruciate ligament repair 2000    Right    Family History  Problem Relation Age of Onset  . Heart disease Neg Hx     no premature CAD known  . Diabetes Mother   . Hypertension Mother   . Hyperlipidemia Mother   . Diabetes Father   . Hypertension Father   . Hyperlipidemia Father     History   Substance Use Topics  . Smoking status: Former Games developer  . Smokeless tobacco: Not on file  . Alcohol Use: Yes     Comment: Occ.      Review of Systems  Constitutional: Negative for fever and chills.  Gastrointestinal: Negative for vomiting and abdominal pain.  Genitourinary: Positive for hematuria. Negative for dysuria, urgency, frequency, flank pain, incomplete emptying and nocturia.  All other systems reviewed and are negative.    Allergies  Metformin  Home Medications   Current Outpatient Rx  Name  Route  Sig  Dispense  Refill  . ACETAMINOPHEN 500 MG PO TABS   Oral   Take 1,000 mg by mouth every 6 (six) hours as needed. For pain.         Marland Kitchen ALLOPURINOL 300 MG PO TABS   Oral   Take 1 tablet (300 mg total) by mouth as needed. For gout flare-up.   30 tablet   11   . ATORVASTATIN CALCIUM 20 MG PO TABS   Oral   Take 1 tablet (20 mg total) by mouth daily.   90 tablet   3   . FEXOFENADINE HCL 180 MG PO TABS   Oral   Take 180 mg by mouth daily as needed. For allergies.         Marland Kitchen FLUTICASONE PROPIONATE 50 MCG/ACT NA SUSP   Nasal   Place 2 sprays into the nose daily as needed. For allergies.         Marland Kitchen FLUTICASONE  PROPIONATE 50 MCG/ACT NA SUSP   Nasal   Place 2 sprays into the nose daily.   16 g   2   . FUROSEMIDE 40 MG PO TABS   Oral   Take 1 tablet (40 mg total) by mouth daily.   30 tablet   5   . HALOBETASOL PROPIONATE 0.05 % EX CREA      APPLY  CREAM TOPICALLY THREE TIMES DAILY AS NEEDED FOR RASH   50 g   2   . JANUVIA 100 MG PO TABS      TAKE ONE TABLET BY MOUTH EVERY DAY   30 tablet   0     PT NEEDS TO SCHEDULE FOLLOW UP APPT   . METOPROLOL TARTRATE 25 MG PO TABS   Oral   Take 1 tablet (25 mg total) by mouth 2 (two) times daily.   60 tablet   6   . ADULT MULTIVITAMIN W/MINERALS CH   Oral   Take 1 tablet by mouth daily.         Marland Kitchen OLMESARTAN MEDOXOMIL 40 MG PO TABS   Oral   Take 40 mg by mouth daily.           Marland Kitchen TADALAFIL 5  MG PO TABS   Oral   Take 1 tablet (5 mg total) by mouth daily as needed for erectile dysfunction.   30 tablet   0   . WARFARIN SODIUM 5 MG PO TABS      Take as directed by anticoagulation   50 tablet   1     BP 160/70  Pulse 104  Temp 99 F (37.2 C) (Oral)  Resp 20  Wt 259 lb (117.482 kg)  SpO2 99%  Physical Exam CONSTITUTIONAL: Well developed/well nourished HEAD AND FACE: Normocephalic/atraumatic EYES: EOMI/PERRL ENMT: Mucous membranes moist NECK: supple no meningeal signs SPINE:entire spine nontender CV: S1/S2 noted, no murmurs/rubs/gallops noted LUNGS: Lungs are clear to auscultation bilaterally, no apparent distress ABDOMEN: soft, nontender, no rebound or guarding GU:no cva tenderness, no blood at meatus.  No signs of trauma.  No testicular tenderness noted NEURO: Pt is awake/alert, moves all extremitiesx4 EXTREMITIES: pulses normal, full ROM SKIN: warm, color normal PSYCH: no abnormalities of mood noted  ED Course  Procedures    Labs Reviewed  URINALYSIS, ROUTINE W REFLEX MICROSCOPIC  CBC WITH DIFFERENTIAL  PROTIME-INR   Pt stable.  Watching TV and in no distress, and no pain reported He will hold his coumadin tonight and tomorrow night and will f/u with PCP ASAP for recheck of INR and CBC.  Will not give Vit K for now as pt is stable and do not feel this is life threatening bleed.  Will start keflex for possible uti/cystitis and also referred to urology.     MDM  Nursing notes including past medical history and social history reviewed and considered in documentation Labs/vital reviewed and considered Previous records reviewed and considered - previous labs reviewed         Joya Gaskins, MD 12/02/12 2213

## 2012-12-02 NOTE — ED Notes (Signed)
MD at bedside. 

## 2012-12-02 NOTE — ED Notes (Signed)
Blood in his urine x 4 hours. No pain. States he takes Coumadin.

## 2012-12-03 ENCOUNTER — Telehealth: Payer: Self-pay

## 2012-12-03 NOTE — Telephone Encounter (Signed)
LM on patient's to CB. Patient's wife did call to say that patient did go to the ER last night but he is at work today.

## 2012-12-03 NOTE — Telephone Encounter (Signed)
lmtrc Pt will need to contact the coumadin clinic

## 2012-12-03 NOTE — Telephone Encounter (Signed)
Message copied by Sharlyne Pacas on Fri Dec 03, 2012  7:42 AM ------      Message from: Romero Belling      Created: Fri Dec 03, 2012  7:35 AM      Regarding: FW: PATIENT FOLLOWUP FOR INR       Please call coumadin clinic.  INR was 4 yesterday.  He needs f/u today.                  ----- Message -----         From: Joya Gaskins, MD         Sent: 12/02/2012   9:59 PM           To: Romero Belling, MD      Subject: PATIENT FOLLOWUP FOR INR                                 Hello            Mr Noxon was seen for painless hematuria.  His INR was 4.  I have asked him to hold next two doses of coumadin and to have inr and CBC checked soon after.  Could you work him into the office?  I have also referred him to urology            Thanks      Zadie Rhine

## 2012-12-06 NOTE — Telephone Encounter (Signed)
Patient notified of need to contact coumadin clinic [address] he [address]. Patient states he went to E.R. And was told to stop coumadin for 2 days  Which he did and is better now with no bleeding in his urine. patient states he will contact [address] cou[facility]in clinic.

## 2012-12-17 ENCOUNTER — Telehealth: Payer: Self-pay

## 2012-12-17 NOTE — Telephone Encounter (Signed)
Patient called 12/16/12 and was told Dr.Jordan reviewed monitor which revealed episodes of fast heart beat.Dr.Jordan advised needs appointment.Appointment scheduled with Dr.Jordan 12/20/12 at 4:00 pm.

## 2012-12-20 ENCOUNTER — Other Ambulatory Visit: Payer: Self-pay | Admitting: Endocrinology

## 2012-12-20 ENCOUNTER — Encounter: Payer: Self-pay | Admitting: Cardiology

## 2012-12-20 ENCOUNTER — Ambulatory Visit (INDEPENDENT_AMBULATORY_CARE_PROVIDER_SITE_OTHER): Payer: BC Managed Care – PPO | Admitting: Cardiology

## 2012-12-20 VITALS — BP 154/88 | HR 115 | Ht 72.0 in | Wt 253.0 lb

## 2012-12-20 DIAGNOSIS — I82409 Acute embolism and thrombosis of unspecified deep veins of unspecified lower extremity: Secondary | ICD-10-CM

## 2012-12-20 DIAGNOSIS — I10 Essential (primary) hypertension: Secondary | ICD-10-CM

## 2012-12-20 DIAGNOSIS — R609 Edema, unspecified: Secondary | ICD-10-CM

## 2012-12-20 DIAGNOSIS — Z7901 Long term (current) use of anticoagulants: Secondary | ICD-10-CM

## 2012-12-20 DIAGNOSIS — I4891 Unspecified atrial fibrillation: Secondary | ICD-10-CM

## 2012-12-20 MED ORDER — SITAGLIPTIN PHOSPHATE 100 MG PO TABS: 100.0000 mg | ORAL_TABLET | Freq: Every day | ORAL | Status: DC

## 2012-12-20 NOTE — Telephone Encounter (Signed)
Pt called requesting refill on Januvia, pt was advised that he needs an appt, per dr. Everardo All appt made for 01/10/13

## 2012-12-20 NOTE — Patient Instructions (Signed)
Take metoprolol 25 mg twice a day in addition to your prior medication  I will schedule you to see Dr. Johney Frame ( rhythm specialist )

## 2012-12-20 NOTE — Telephone Encounter (Signed)
Sent refill to pharmacy. 

## 2012-12-21 NOTE — Progress Notes (Signed)
Spencer English Date of Birth: January 01, 1966 Medical Record #[id number]  History of Present Illness: Spencer English is seen for followup of palpitations and edema. He continues to experience rapid heart beats on a daily basis. There are no clear triggers. He has reduced his caffeine intake. He denies any significant chest pain or shortness of breath. He has had bilateral lower extremity edema. It is worse on the left. 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 We had him wear an event monitor and this demonstrated multiple episodes of atrial fibrillation and flutter with rates of 150-160 beats per minute on verapamil. His highest heart rate was 180 beats per minute.  Current Outpatient Prescriptions on File Prior to Visit  Medication Sig Dispense Refill  . acetaminophen (TYLENOL) 500 MG tablet Take 1,000 mg by mouth every 6 (six) hours as needed. For pain.      Marland Kitchen allopurinol (ZYLOPRIM) 300 MG tablet Take 1 tablet (300 mg total) by mouth as needed. For gout flare-up.  30 tablet  11  . atorvastatin (LIPITOR) 20 MG tablet Take 1 tablet (20 mg total) by mouth daily.  90 tablet  3  . cephALEXin (KEFLEX) 500 MG capsule Take 1 capsule (500 mg total) by mouth 4 (four) times daily.  28 capsule  0  . fexofenadine (ALLEGRA) 180 MG tablet Take 180 mg by mouth daily as needed. For allergies.      . fluticasone (FLONASE) 50 MCG/ACT nasal spray Place 2 sprays into the nose daily as needed. For allergies.      . fluticasone (FLONASE) 50 MCG/ACT nasal spray Place 2 sprays into the nose daily.  16 g  2  . furosemide (LASIX) 40 MG tablet Take 1 tablet (40 mg total) by mouth daily.  30 tablet  5  . halobetasol (ULTRAVATE) 0.05 % cream APPLY  CREAM TOPICALLY THREE TIMES DAILY AS NEEDED FOR RASH  50 g  2  . metoprolol tartrate (LOPRESSOR) 25 MG tablet Take 1 tablet (25 mg total) by mouth 2 (two) times daily.  60 tablet  6  . Multiple Vitamin (MULITIVITAMIN  WITH MINERALS) TABS Take 1 tablet by mouth daily.      Marland Kitchen olmesartan (BENICAR) 40 MG tablet Take 40 mg by mouth daily.        . sitaGLIPtin (JANUVIA) 100 MG tablet Take 1 tablet (100 mg total) by mouth daily.  30 tablet  0  . tadalafil (CIALIS) 5 MG tablet Take 5 mg by mouth daily as needed.      . warfarin (COUMADIN) 5 MG tablet Take as directed by anticoagulation  50 tablet  1    Allergies  Allergen Reactions  . Metformin     REACTION: nausea/diarrhea    Past Medical History  Diagnosis Date  . DM 08/08/2008  . DYSLIPIDEMIA 04/26/2009  . GOUT 10/24/2010  . HYPERTENSION 07/21/2007  . PULMONARY EMBOLISM 05/03/2009  . DEEP VENOUS THROMBOPHLEBITIS, LEG, LEFT 05/05/2009  . ALLERGIC RHINITIS 04/12/2009  . UNSPECIFIED URINARY CALCULUS 07/04/2010  . 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 ECZEMA 05/23/2010  . Long term (current) use of anticoagulants 04/03/2011  . BACK PAIN, LUMBAR 01/14/2011  . 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 Hyperglycemia   . Leukopenia   . Atrial fibrillation     Past Surgical History  Procedure Date  . Anterior cruciate ligament repair 2000    Right    History  Smoking status  . Former Smoker  Smokeless tobacco  . Not on file    History  Alcohol Use  . Yes    Comment: Occ.    Family History  Problem Relation Age of Onset  . Heart disease Neg Hx     no premature CAD known  . Diabetes Mother   . Hypertension Mother   . Hyperlipidemia Mother   . Diabetes Father   . Hypertension Father   . Hyperlipidemia Father     Review of Systems: As noted in history of present illness.  All other systems were reviewed and are negative.  Physical Exam: BP 154/88  Pulse 115  Ht 6' (1.829 m)  Wt 253 lb (114.76 kg)  BMI 34.31 kg/m2 He is an obese black male in no acute distress. HEENT exam is unremarkable. Pupils equal round and reactive to light accommodation. Sclera clear. Oropharynx clear. Neck is without JVD, adenopathy, directly, or bruits. Lungs are clear.  Cardiac exam reveals a regular rate and rhythm without gallop, murmur, or click. Abdomen is obese, soft, nontender. No masses. Extremities reveal 1-2+ pretibial edema bilaterally. Pedal pulses are good. Skin is warm and dry. He is alert and oriented x3. Cranial nerves II through XII are intact. LABORATORY DATA: Event monitor demonstrates atrial fibrillation and atrial flutter with rapid ventricular response with rates of 150-160.  Transthoracic Echocardiography  Patient: Spencer English, Spencer English MR #: [id number] Study Date: 11/22/2012 Gender: M Age: 46 Height: 182.9cm Weight: 117.5kg BSA: 2.36m^2 Pt. Status: Room:  ORDERING [place], Nadim Malia REFERRING [place], Orson Rho ATTENDING Default, Provider W Filomena Jungling SONOGRAPHER Aida Raider, RDCS PERFORMING [facility], [facility] cc:  ------------------------------------------------------------ LV EF: 65% - 70%  ------------------------------------------------------------ Indications: Palpitations 785.1.  ------------------------------------------------------------ History: PMH: Acquired from the patient and from the patient's chart. PMH: DVT. Pulmonary Embolus. Edema. Palpitations. Chest pain. Risk factors: Former tobacco use. Hypertension. Diabetes mellitus. Dyslipidemia.  ------------------------------------------------------------ Study Conclusions  - Left ventricle: The cavity size was normal. Wall thickness was normal. Systolic function was vigorous. The estimated ejection fraction was in the range of 65% to 70%. Wall motion was normal; there were no regional wall motion abnormalities. Doppler parameters are consistent with abnormal left ventricular relaxation (grade 1 diastolic dysfunction). 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 - Left atrium: The atrium was mildly dilated. Transthoracic echocardiography. M-mode, complete 2D, spectral Doppler, and color Doppler. Height: Height: 182.9cm. Height: 72in. Weight:  Weight: 117.5kg. Weight: 258.5lb. Body mass index: BMI: 35.1kg/m^2. Body surface area: BSA: 2.14m^2. Blood pressure: 141/96. Patient status: Outpatient. Location: [place] [facility]  ------------------------------------------------------------  ------------------------------------------------------------ Left ventricle: The cavity size was normal. Wall thickness was normal. Systolic function was vigorous. The estimated ejection fraction was in the range of 65% to 70%. Wall motion was normal; there were no regional wall motion abnormalities. Doppler parameters are consistent with abnormal left ventricular relaxation (grade 1 diastolic dysfunction).  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  ------------------------------------------------------------ Mitral valve: Structurally normal valve. Leaflet separation was normal. Doppler: Transvalvular velocity was within the normal range. There was no evidence for stenosis. No regurgitation. Peak gradient: 4mm Hg (D).  ------------------------------------------------------------ Left atrium: The atrium was mildly dilated.  ------------------------------------------------------------ Right ventricle: The cavity size was normal. Wall thickness was normal. Systolic function was normal.  ------------------------------------------------------------ Pulmonic valve: Structurally normal valve. Cusp separation was normal. Doppler: Transvalvular velocity was within the normal range. No significant regurgitation.  ------------------------------------------------------------ Tricuspid valve: Normal thickness leaflets. Doppler: Mild regurgitation.  ------------------------------------------------------------ Right atrium: The atrium was normal in size.  ------------------------------------------------------------  Pericardium: The pericardium was normal in  appearance.  ------------------------------------------------------------  2D measurements Normal Doppler measurements Normal Left ventricle Main pulmonary LVID ED, 46.7 mm 43-52 artery chord, Pressure, S 21 mm Hg =30 PLAX Left ventricle LVID ES, 28.7 mm 23-38 Ea, lat 13 cm/s ------ chord, ann, tiss PLAX DP FS, chord, 39 % >29 E/Ea, lat 7.4 ------ PLAX ann, tiss 8 LVPW, ED 9.99 mm ------ DP IVS/LVPW 0.95 <1.3 Ea, med 10. cm/s ------ ratio, ED ann, tiss 7 Ventricular septum DP IVS, ED 9.45 mm ------ E/Ea, med 9.0 ------ LVOT ann, tiss 8 Diam, S 23 mm ------ DP Area 4.15 cm^2 ------ LVOT Diam 23 mm ------ Peak vel, S 140 cm/s ------ Aorta VTI, S 20. cm ------ Root diam, 41 mm ------ 5 ED Peak 8 mm Hg ------ AAo AP 34 mm ------ gradient, S diam, S Stroke vol 85. ml ------ Left atrium 2 AP dim 45 mm ------ Stroke 35. ml/m^2 ------ AP dim 1.89 cm/m^2 <2.2 index 8 index Mitral valve Peak E vel 97. cm/s ------ 2 Peak A vel 133 cm/s ------ Deceleratio 180 ms 150-23 n time 0 Peak 4 mm Hg ------ gradient, D Peak E/A 0.7 ------ ratio Tricuspid valve Regurg peak 200 cm/s ------ vel Peak RV-RA 16 mm Hg ------ gradient, S Systemic veins Estimated 5 mm Hg ------ CVP Right ventricle Pressure, S 21 mm Hg <30 Sa vel, lat 16. cm/s ------ ann, tiss 4 DP  ------------------------------------------------------------ Prepared and Electronically Authenticated by  Cassell Clement 2013-11-25T17:23:[id number]   Assessment / Plan: 1. paroxysmal atrial fibrillation and flutter. He has a rapid ventricular response despite being on verapamil. I recommended the addition of metoprolol 25 mg twice a day. He is on chronic anticoagulation because of his history of DVT and pulmonary embolus. We will continue this also for reduction in his risk of stroke. His echocardiogram is fairly unremarkable with the exception of mild left atrial enlargement. I think he may be a potential good candidate  for atrial fibrillation ablation. The other alternative would be to consider long-term antiarrhythmic drug therapy. I recommended evaluation by ED and we will schedule him an appointment to see Dr. Johney Frame.  2. Lower extremity edema. I suspect this is related to his venous disease with prior DVT. Echo shows no evidence of pulmonary hypertension right ventricular enlargement.  3. Diabetes mellitus type 2. I stressed the importance of adhering with a carbohydrate modified diet and eliminating sweets and sodas from his diet.  4. Hypertension, controlled.  5. History of pulmonary embolus with DVT. He is on chronic Coumadin therapy. His last INR was therapeutic at 2.5.

## 2013-01-03 ENCOUNTER — Encounter (HOSPITAL_COMMUNITY): Payer: Self-pay | Admitting: Surgery

## 2013-01-03 ENCOUNTER — Other Ambulatory Visit: Payer: Self-pay | Admitting: Physician Assistant

## 2013-01-03 ENCOUNTER — Encounter (HOSPITAL_BASED_OUTPATIENT_CLINIC_OR_DEPARTMENT_OTHER): Payer: Self-pay | Admitting: *Deleted

## 2013-01-03 ENCOUNTER — Emergency Department (HOSPITAL_BASED_OUTPATIENT_CLINIC_OR_DEPARTMENT_OTHER): Payer: BC Managed Care – PPO

## 2013-01-03 ENCOUNTER — Ambulatory Visit (HOSPITAL_COMMUNITY): Payer: BC Managed Care – PPO | Admitting: Anesthesiology

## 2013-01-03 ENCOUNTER — Encounter (HOSPITAL_COMMUNITY): Payer: Self-pay | Admitting: Anesthesiology

## 2013-01-03 ENCOUNTER — Emergency Department (HOSPITAL_BASED_OUTPATIENT_CLINIC_OR_DEPARTMENT_OTHER)
Admission: EM | Admit: 2013-01-03 | Discharge: 2013-01-03 | Disposition: A | Discharge status: Home / Self Care | Payer: BC Managed Care – PPO | Source: Home / Self Care | Attending: Emergency Medicine | Admitting: Emergency Medicine

## 2013-01-03 ENCOUNTER — Encounter (HOSPITAL_COMMUNITY)
Admission: RE | Disposition: A | Discharge status: Home / Self Care | Payer: Self-pay | Source: Ambulatory Visit | Attending: Orthopedic Surgery

## 2013-01-03 ENCOUNTER — Ambulatory Visit (HOSPITAL_COMMUNITY): Payer: BC Managed Care – PPO

## 2013-01-03 ENCOUNTER — Inpatient Hospital Stay (HOSPITAL_BASED_OUTPATIENT_CLINIC_OR_DEPARTMENT_OTHER)
Admission: RE | Admit: 2013-01-03 | Discharge: 2013-01-07 | DRG: 559 | Disposition: A | Discharge status: Home / Self Care | Payer: BC Managed Care – PPO | Source: Ambulatory Visit | Attending: Emergency Medicine | Admitting: Emergency Medicine

## 2013-01-03 DIAGNOSIS — I4891 Unspecified atrial fibrillation: Secondary | ICD-10-CM | POA: Diagnosis present

## 2013-01-03 DIAGNOSIS — Z872 Personal history of diseases of the skin and subcutaneous tissue: Secondary | ICD-10-CM

## 2013-01-03 DIAGNOSIS — I2782 Chronic pulmonary embolism: Secondary | ICD-10-CM | POA: Diagnosis present

## 2013-01-03 DIAGNOSIS — I82409 Acute embolism and thrombosis of unspecified deep veins of unspecified lower extremity: Secondary | ICD-10-CM | POA: Diagnosis present

## 2013-01-03 DIAGNOSIS — Z7901 Long term (current) use of anticoagulants: Secondary | ICD-10-CM

## 2013-01-03 DIAGNOSIS — I10 Essential (primary) hypertension: Secondary | ICD-10-CM | POA: Diagnosis present

## 2013-01-03 DIAGNOSIS — M9420 Chondromalacia, unspecified site: Secondary | ICD-10-CM | POA: Diagnosis present

## 2013-01-03 DIAGNOSIS — Z87448 Personal history of other diseases of urinary system: Secondary | ICD-10-CM

## 2013-01-03 DIAGNOSIS — E118 Type 2 diabetes mellitus with unspecified complications: Secondary | ICD-10-CM | POA: Diagnosis present

## 2013-01-03 DIAGNOSIS — Z8672 Personal history of thrombophlebitis: Secondary | ICD-10-CM

## 2013-01-03 DIAGNOSIS — E119 Type 2 diabetes mellitus without complications: Secondary | ICD-10-CM | POA: Diagnosis present

## 2013-01-03 DIAGNOSIS — E1169 Type 2 diabetes mellitus with other specified complication: Secondary | ICD-10-CM | POA: Diagnosis present

## 2013-01-03 DIAGNOSIS — J309 Allergic rhinitis, unspecified: Secondary | ICD-10-CM

## 2013-01-03 DIAGNOSIS — M25569 Pain in unspecified knee: Secondary | ICD-10-CM

## 2013-01-03 DIAGNOSIS — I2699 Other pulmonary embolism without acute cor pulmonale: Secondary | ICD-10-CM | POA: Diagnosis present

## 2013-01-03 DIAGNOSIS — M009 Pyogenic arthritis, unspecified: Principal | ICD-10-CM | POA: Diagnosis present

## 2013-01-03 DIAGNOSIS — M109 Gout, unspecified: Secondary | ICD-10-CM | POA: Diagnosis present

## 2013-01-03 DIAGNOSIS — Z86711 Personal history of pulmonary embolism: Secondary | ICD-10-CM

## 2013-01-03 DIAGNOSIS — Z862 Personal history of diseases of the blood and blood-forming organs and certain disorders involving the immune mechanism: Secondary | ICD-10-CM

## 2013-01-03 DIAGNOSIS — E785 Hyperlipidemia, unspecified: Secondary | ICD-10-CM | POA: Diagnosis present

## 2013-01-03 DIAGNOSIS — Z8679 Personal history of other diseases of the circulatory system: Secondary | ICD-10-CM

## 2013-01-03 DIAGNOSIS — R609 Edema, unspecified: Secondary | ICD-10-CM

## 2013-01-03 DIAGNOSIS — Z87442 Personal history of urinary calculi: Secondary | ICD-10-CM

## 2013-01-03 DIAGNOSIS — Z79899 Other long term (current) drug therapy: Secondary | ICD-10-CM

## 2013-01-03 DIAGNOSIS — Z87891 Personal history of nicotine dependence: Secondary | ICD-10-CM

## 2013-01-03 HISTORY — PX: KNEE ARTHROSCOPY: SHX127

## 2013-01-03 HISTORY — DX: Gastro-esophageal reflux disease without esophagitis: K21.9

## 2013-01-03 LAB — TYPE AND SCREEN
ABO/RH(D): A POS
Antibody Screen: NEGATIVE

## 2013-01-03 LAB — ABO/RH: ABO/RH(D): A POS

## 2013-01-03 LAB — BASIC METABOLIC PANEL
CO2: 25 mEq/L (ref 19–32)
Calcium: 9.3 mg/dL (ref 8.4–10.5)
GFR calc Af Amer: >90 mL/min (ref >90)
GFR calc non Af Amer: >90 mL/min (ref >90)
Sodium: 137 mEq/L (ref 135–145)

## 2013-01-03 LAB — CBC WITH DIFFERENTIAL/PLATELET
Basophils Absolute: 0 10*3/uL (ref 0.0–0.1)
Basophils Relative: 0 % (ref 0–1)
Eosinophils Relative: 0 % (ref 0–5)
Lymphocytes Relative: 15 % (ref 12–46)
MCHC: 32.2 g/dL (ref 30.0–36.0)
MCV: 74.8 fL — ABNORMAL LOW (ref 78.0–100.0)
Platelets: 224 10*3/uL (ref 150–400)
RDW: 15.3 % (ref 11.5–15.5)
WBC: 7.2 10*3/uL (ref 4.0–10.5)

## 2013-01-03 LAB — GRAM STAIN

## 2013-01-03 LAB — RPR: RPR Ser Ql: NONREACTIVE

## 2013-01-03 LAB — PROTIME-INR
INR: 2.62 — ABNORMAL HIGH (ref 0.00–1.49)
Prothrombin Time: 26.7 seconds — ABNORMAL HIGH (ref 11.6–15.2)

## 2013-01-03 LAB — GLUCOSE, CAPILLARY
Glucose-Capillary: 135 mg/dL — ABNORMAL HIGH (ref 70–99)
Glucose-Capillary: 119 mg/dL — ABNORMAL HIGH (ref 70–99)

## 2013-01-03 LAB — SURGICAL PCR SCREEN: MRSA, PCR: NEGATIVE

## 2013-01-03 SURGERY — ARTHROSCOPY, KNEE
Anesthesia: General | Site: Knee | Laterality: Left | Wound class: Dirty or Infected

## 2013-01-03 MED ORDER — METOCLOPRAMIDE HCL 5 MG/ML IJ SOLN: 5.0000 mg | Freq: Three times a day (TID) | INTRAMUSCULAR | Status: DC | PRN

## 2013-01-03 MED ORDER — HYDROMORPHONE HCL PF 1 MG/ML IJ SOLN
INTRAMUSCULAR | Status: AC
  Administered 2013-01-03: 1 mg
  Filled 2013-01-03: qty 1

## 2013-01-03 MED ORDER — METOPROLOL TARTRATE 25 MG PO TABS
25.0000 mg | ORAL_TABLET | Freq: Once | ORAL | Status: AC
  Administered 2013-01-03: 25 mg via ORAL

## 2013-01-03 MED ORDER — OXYCODONE HCL 5 MG PO TABS: 5.0000 mg | ORAL_TABLET | Freq: Once | ORAL | Status: DC | PRN

## 2013-01-03 MED ORDER — SENNOSIDES-DOCUSATE SODIUM 8.6-50 MG PO TABS: 1.0000 | ORAL_TABLET | Freq: Every evening | ORAL | Status: DC | PRN

## 2013-01-03 MED ORDER — FLUTICASONE PROPIONATE 50 MCG/ACT NA SUSP
2.0000 | Freq: Every day | NASAL | Status: DC | PRN
  Filled 2013-01-03: qty 16

## 2013-01-03 MED ORDER — METHOCARBAMOL 100 MG/ML IJ SOLN
500.0000 mg | Freq: Four times a day (QID) | INTRAVENOUS | Status: DC | PRN
  Filled 2013-01-03: qty 5

## 2013-01-03 MED ORDER — MUPIROCIN 2 % EX OINT
TOPICAL_OINTMENT | Freq: Two times a day (BID) | CUTANEOUS | Status: DC
  Administered 2013-01-03 – 2013-01-05 (×4): via NASAL
  Administered 2013-01-05: 1 via NASAL
  Administered 2013-01-06 – 2013-01-07 (×3): via NASAL
  Filled 2013-01-03: qty 22

## 2013-01-03 MED ORDER — ALLOPURINOL 300 MG PO TABS
300.0000 mg | ORAL_TABLET | Freq: Every day | ORAL | Status: DC | PRN
  Filled 2013-01-03: qty 1

## 2013-01-03 MED ORDER — SODIUM CHLORIDE 0.9 % IV SOLN: INTRAVENOUS | Status: DC

## 2013-01-03 MED ORDER — ATORVASTATIN CALCIUM 20 MG PO TABS
20.0000 mg | ORAL_TABLET | Freq: Every day | ORAL | Status: DC
  Administered 2013-01-03 – 2013-01-07 (×5): 20 mg via ORAL
  Filled 2013-01-03 (×5): qty 1

## 2013-01-03 MED ORDER — METOPROLOL TARTRATE 25 MG PO TABS
25.0000 mg | ORAL_TABLET | Freq: Two times a day (BID) | ORAL | Status: DC
  Administered 2013-01-03 – 2013-01-05 (×4): 25 mg via ORAL
  Filled 2013-01-03 (×5): qty 1

## 2013-01-03 MED ORDER — LIDOCAINE HCL (CARDIAC) 20 MG/ML IV SOLN
INTRAVENOUS | Status: DC | PRN
  Administered 2013-01-03: 50 mg via INTRAVENOUS

## 2013-01-03 MED ORDER — FUROSEMIDE 40 MG PO TABS
40.0000 mg | ORAL_TABLET | Freq: Every day | ORAL | Status: DC
  Administered 2013-01-03 – 2013-01-07 (×5): 40 mg via ORAL
  Filled 2013-01-03 (×5): qty 1

## 2013-01-03 MED ORDER — BUPIVACAINE-EPINEPHRINE (PF) 0.5% -1:200000 IJ SOLN
INTRAMUSCULAR | Status: AC
  Filled 2013-01-03: qty 10

## 2013-01-03 MED ORDER — ONDANSETRON HCL 4 MG/2ML IJ SOLN
INTRAMUSCULAR | Status: DC | PRN
  Administered 2013-01-03: 4 mg via INTRAVENOUS

## 2013-01-03 MED ORDER — DOXYCYCLINE HYCLATE 100 MG PO CAPS: 100.0000 mg | ORAL_CAPSULE | Freq: Two times a day (BID) | ORAL | Status: DC

## 2013-01-03 MED ORDER — VANCOMYCIN HCL IN DEXTROSE 1-5 GM/200ML-% IV SOLN
1000.0000 mg | Freq: Two times a day (BID) | INTRAVENOUS | Status: DC
  Administered 2013-01-04 – 2013-01-05 (×4): 1000 mg via INTRAVENOUS
  Filled 2013-01-03 (×5): qty 200

## 2013-01-03 MED ORDER — HYDROMORPHONE HCL PF 2 MG/ML IJ SOLN: 1.0000 mg | INTRAMUSCULAR | Status: DC | PRN

## 2013-01-03 MED ORDER — HYDROMORPHONE HCL PF 1 MG/ML IJ SOLN
INTRAMUSCULAR | Status: AC
  Filled 2013-01-03: qty 1

## 2013-01-03 MED ORDER — SODIUM CHLORIDE 0.9 % IV SOLN
INTRAVENOUS | Status: DC
  Administered 2013-01-03 (×2): via INTRAVENOUS

## 2013-01-03 MED ORDER — METHOCARBAMOL 500 MG PO TABS
500.0000 mg | ORAL_TABLET | Freq: Four times a day (QID) | ORAL | Status: DC | PRN
  Administered 2013-01-03 – 2013-01-04 (×2): 500 mg via ORAL
  Filled 2013-01-03 (×4): qty 1

## 2013-01-03 MED ORDER — HYDROMORPHONE HCL PF 1 MG/ML IJ SOLN
INTRAMUSCULAR | Status: DC | PRN
  Administered 2013-01-03 (×2): 0.5 mg via INTRAVENOUS

## 2013-01-03 MED ORDER — VANCOMYCIN HCL IN DEXTROSE 1-5 GM/200ML-% IV SOLN
1000.0000 mg | Freq: Once | INTRAVENOUS | Status: AC
  Administered 2013-01-03: 1000 mg via INTRAVENOUS
  Filled 2013-01-03: qty 200

## 2013-01-03 MED ORDER — METOCLOPRAMIDE HCL 10 MG PO TABS: 5.0000 mg | ORAL_TABLET | Freq: Three times a day (TID) | ORAL | Status: DC | PRN

## 2013-01-03 MED ORDER — INSULIN ASPART 100 UNIT/ML ~~LOC~~ SOLN
4.0000 [IU] | Freq: Three times a day (TID) | SUBCUTANEOUS | Status: DC
  Administered 2013-01-04 – 2013-01-05 (×6): 4 [IU] via SUBCUTANEOUS

## 2013-01-03 MED ORDER — DEXTROSE 5 % IV SOLN
1.0000 g | INTRAVENOUS | Status: DC
  Administered 2013-01-04 – 2013-01-07 (×4): 1 g via INTRAVENOUS
  Filled 2013-01-03 (×5): qty 10

## 2013-01-03 MED ORDER — VANCOMYCIN HCL 10 G IV SOLR
2000.0000 mg | Freq: Once | INTRAVENOUS | Status: AC
  Administered 2013-01-03: 2000 mg via INTRAVENOUS
  Filled 2013-01-03: qty 2000

## 2013-01-03 MED ORDER — CEPHALEXIN 500 MG PO CAPS: 500.0000 mg | ORAL_CAPSULE | Freq: Four times a day (QID) | ORAL | Status: DC

## 2013-01-03 MED ORDER — LACTATED RINGERS IV SOLN
INTRAVENOUS | Status: DC | PRN
  Administered 2013-01-03 (×2): via INTRAVENOUS

## 2013-01-03 MED ORDER — FLEET ENEMA 7-19 GM/118ML RE ENEM: 1.0000 | ENEMA | Freq: Once | RECTAL | Status: AC | PRN

## 2013-01-03 MED ORDER — METOPROLOL TARTRATE 12.5 MG HALF TABLET
ORAL_TABLET | ORAL | Status: AC
  Filled 2013-01-03: qty 2

## 2013-01-03 MED ORDER — OXYCODONE HCL 5 MG/5ML PO SOLN: 5.0000 mg | Freq: Once | ORAL | Status: DC | PRN

## 2013-01-03 MED ORDER — IRBESARTAN 300 MG PO TABS
300.0000 mg | ORAL_TABLET | Freq: Every day | ORAL | Status: DC
  Administered 2013-01-03 – 2013-01-07 (×5): 300 mg via ORAL
  Filled 2013-01-03 (×5): qty 1

## 2013-01-03 MED ORDER — ONDANSETRON HCL 4 MG/2ML IJ SOLN: 4.0000 mg | Freq: Four times a day (QID) | INTRAMUSCULAR | Status: DC | PRN

## 2013-01-03 MED ORDER — OXYCODONE HCL 5 MG PO TABS
5.0000 mg | ORAL_TABLET | ORAL | Status: DC | PRN
  Administered 2013-01-03 – 2013-01-07 (×18): 10 mg via ORAL
  Filled 2013-01-03 (×18): qty 2

## 2013-01-03 MED ORDER — LORATADINE 10 MG PO TABS
10.0000 mg | ORAL_TABLET | Freq: Every day | ORAL | Status: DC | PRN
  Filled 2013-01-03: qty 1

## 2013-01-03 MED ORDER — PROPOFOL 10 MG/ML IV BOLUS
INTRAVENOUS | Status: DC | PRN
  Administered 2013-01-03: 200 mg via INTRAVENOUS

## 2013-01-03 MED ORDER — ACETAMINOPHEN 325 MG PO TABS
650.0000 mg | ORAL_TABLET | Freq: Four times a day (QID) | ORAL | Status: DC | PRN
  Administered 2013-01-04: 650 mg via ORAL
  Filled 2013-01-03: qty 2

## 2013-01-03 MED ORDER — FENTANYL CITRATE 0.05 MG/ML IJ SOLN
INTRAMUSCULAR | Status: DC | PRN
  Administered 2013-01-03 (×10): 50 ug via INTRAVENOUS

## 2013-01-03 MED ORDER — DOCUSATE SODIUM 100 MG PO CAPS
100.0000 mg | ORAL_CAPSULE | Freq: Two times a day (BID) | ORAL | Status: DC
  Administered 2013-01-03 – 2013-01-07 (×7): 100 mg via ORAL
  Filled 2013-01-03 (×9): qty 1

## 2013-01-03 MED ORDER — HYDROMORPHONE HCL PF 1 MG/ML IJ SOLN
1.0000 mg | INTRAMUSCULAR | Status: DC | PRN
  Administered 2013-01-03 – 2013-01-04 (×3): 1 mg via INTRAVENOUS
  Filled 2013-01-03 (×3): qty 1

## 2013-01-03 MED ORDER — MIDAZOLAM HCL 5 MG/5ML IJ SOLN
INTRAMUSCULAR | Status: DC | PRN
  Administered 2013-01-03: 2 mg via INTRAVENOUS

## 2013-01-03 MED ORDER — ARTIFICIAL TEARS OP OINT
TOPICAL_OINTMENT | OPHTHALMIC | Status: DC | PRN
  Administered 2013-01-03: 1 via OPHTHALMIC

## 2013-01-03 MED ORDER — INSULIN ASPART 100 UNIT/ML ~~LOC~~ SOLN
0.0000 [IU] | Freq: Three times a day (TID) | SUBCUTANEOUS | Status: DC
  Administered 2013-01-04: 3 [IU] via SUBCUTANEOUS
  Administered 2013-01-04 (×2): 2 [IU] via SUBCUTANEOUS
  Administered 2013-01-05: 5 [IU] via SUBCUTANEOUS
  Administered 2013-01-05: 4 [IU] via SUBCUTANEOUS
  Administered 2013-01-05: 2 [IU] via SUBCUTANEOUS

## 2013-01-03 MED ORDER — SODIUM CHLORIDE 0.9 % IV SOLN
INTRAVENOUS | Status: DC
  Administered 2013-01-03 – 2013-01-07 (×4): via INTRAVENOUS

## 2013-01-03 MED ORDER — PHENOL 1.4 % MT LIQD
1.0000 | OROMUCOSAL | Status: DC | PRN
  Administered 2013-01-04: 1 via OROMUCOSAL
  Filled 2013-01-03: qty 177

## 2013-01-03 MED ORDER — FENTANYL CITRATE 0.05 MG/ML IJ SOLN
100.0000 ug | Freq: Once | INTRAMUSCULAR | Status: AC
  Administered 2013-01-03: 100 ug via INTRAVENOUS
  Filled 2013-01-03: qty 2

## 2013-01-03 MED ORDER — INSULIN ASPART 100 UNIT/ML ~~LOC~~ SOLN
0.0000 [IU] | Freq: Every day | SUBCUTANEOUS | Status: DC
  Administered 2013-01-05: 2 [IU] via SUBCUTANEOUS

## 2013-01-03 MED ORDER — BISACODYL 10 MG RE SUPP: 10.0000 mg | Freq: Every day | RECTAL | Status: DC | PRN

## 2013-01-03 MED ORDER — HYDROMORPHONE HCL PF 1 MG/ML IJ SOLN
0.2500 mg | INTRAMUSCULAR | Status: DC | PRN
  Administered 2013-01-03 (×2): 0.5 mg via INTRAVENOUS

## 2013-01-03 MED ORDER — HYDROMORPHONE HCL PF 1 MG/ML IJ SOLN
INTRAMUSCULAR | Status: AC
  Administered 2013-01-03: 1 mg via INTRAVENOUS
  Filled 2013-01-03: qty 1

## 2013-01-03 MED ORDER — MENTHOL 3 MG MT LOZG: 1.0000 | LOZENGE | OROMUCOSAL | Status: DC | PRN

## 2013-01-03 MED ORDER — MUPIROCIN 2 % EX OINT
TOPICAL_OINTMENT | CUTANEOUS | Status: AC
  Administered 2013-01-03: 1 via NASAL
  Filled 2013-01-03: qty 22

## 2013-01-03 MED ORDER — ONDANSETRON HCL 4 MG PO TABS: 4.0000 mg | ORAL_TABLET | Freq: Four times a day (QID) | ORAL | Status: DC | PRN

## 2013-01-03 MED ORDER — VANCOMYCIN HCL IN DEXTROSE 1-5 GM/200ML-% IV SOLN
1000.0000 mg | Freq: Two times a day (BID) | INTRAVENOUS | Status: DC
  Filled 2013-01-03 (×2): qty 200

## 2013-01-03 MED ORDER — CHLORHEXIDINE GLUCONATE 4 % EX LIQD: 60.0000 mL | Freq: Once | CUTANEOUS | Status: DC

## 2013-01-03 MED ORDER — ADULT MULTIVITAMIN W/MINERALS CH
1.0000 | ORAL_TABLET | Freq: Every day | ORAL | Status: DC
  Administered 2013-01-03 – 2013-01-07 (×5): 1 via ORAL
  Filled 2013-01-03 (×5): qty 1

## 2013-01-03 MED ORDER — DEXTROSE 5 % IV SOLN
1.0000 g | Freq: Once | INTRAVENOUS | Status: AC
  Administered 2013-01-03: 1 g via INTRAVENOUS
  Filled 2013-01-03: qty 10

## 2013-01-03 MED ORDER — ACETAMINOPHEN 650 MG RE SUPP: 650.0000 mg | Freq: Four times a day (QID) | RECTAL | Status: DC | PRN

## 2013-01-03 MED ORDER — SODIUM CHLORIDE 0.9 % IR SOLN
Status: DC | PRN
  Administered 2013-01-03 (×6): 3000 mL

## 2013-01-03 SURGICAL SUPPLY — 51 items
BANDAGE ELASTIC 6 VELCRO ST LF (GAUZE/BANDAGES/DRESSINGS) ×2 IMPLANT
BANDAGE GAUZE ELAST BULKY 4 IN (GAUZE/BANDAGES/DRESSINGS) ×2 IMPLANT
BLADE GREAT WHITE 4.2 (BLADE) ×2 IMPLANT
BLADE SURG 11 STRL SS (BLADE) IMPLANT
BLADE SURG ROTATE 9660 (MISCELLANEOUS) IMPLANT
CLOTH BEACON ORANGE TIMEOUT ST (SAFETY) ×2 IMPLANT
COVER SURGICAL LIGHT HANDLE (MISCELLANEOUS) ×2 IMPLANT
CUFF TOURNIQUET SINGLE 44IN (TOURNIQUET CUFF) ×2 IMPLANT
DRAPE ARTHROSCOPY W/POUCH 114 (DRAPES) ×2 IMPLANT
DRAPE U-SHAPE 47X51 STRL (DRAPES) ×2 IMPLANT
DRSG PAD ABDOMINAL 8X10 ST (GAUZE/BANDAGES/DRESSINGS) ×4 IMPLANT
DURAPREP 26ML APPLICATOR (WOUND CARE) ×2 IMPLANT
EVACUATOR 1/8 PVC DRAIN (DRAIN) ×2 IMPLANT
FILTER STRAW FLUID ASPIR (MISCELLANEOUS) ×2 IMPLANT
GAUZE XEROFORM 1X8 LF (GAUZE/BANDAGES/DRESSINGS) ×4 IMPLANT
GLOVE BIOGEL PI IND STRL 6.5 (GLOVE) ×1 IMPLANT
GLOVE BIOGEL PI IND STRL 8 (GLOVE) ×2 IMPLANT
GLOVE BIOGEL PI INDICATOR 6.5 (GLOVE) ×1
GLOVE BIOGEL PI INDICATOR 8 (GLOVE) ×2
GLOVE ORTHO TXT STRL SZ7.5 (GLOVE) ×4 IMPLANT
GLOVE SS BIOGEL STRL SZ 6.5 (GLOVE) ×1 IMPLANT
GLOVE SUPERSENSE BIOGEL SZ 6.5 (GLOVE) ×1
GLOVE SURG ORTHO 8.0 STRL STRW (GLOVE) ×4 IMPLANT
GLOVE SURG SS PI 7.0 STRL IVOR (GLOVE) ×2 IMPLANT
GOWN PREVENTION PLUS XLARGE (GOWN DISPOSABLE) ×2 IMPLANT
GOWN SRG XL XLNG 56XLVL 4 (GOWN DISPOSABLE) ×1 IMPLANT
GOWN STRL NON-REIN LRG LVL3 (GOWN DISPOSABLE) ×4 IMPLANT
GOWN STRL NON-REIN XL XLG LVL4 (GOWN DISPOSABLE) ×1
KIT ROOM TURNOVER OR (KITS) ×2 IMPLANT
MANIFOLD NEPTUNE II (INSTRUMENTS) ×2 IMPLANT
NEEDLE 18GX1X1/2 (RX/OR ONLY) (NEEDLE) IMPLANT
NEEDLE HYPO 25GX1X1/2 BEV (NEEDLE) IMPLANT
NEEDLE SPNL 18GX3.5 QUINCKE PK (NEEDLE) IMPLANT
NS IRRIG 1000ML POUR BTL (IV SOLUTION) IMPLANT
PACK ARTHROSCOPY DSU (CUSTOM PROCEDURE TRAY) ×2 IMPLANT
PAD ARMBOARD 7.5X6 YLW CONV (MISCELLANEOUS) ×4 IMPLANT
SET ARTHROSCOPY TUBING (MISCELLANEOUS) ×1
SET ARTHROSCOPY TUBING LN (MISCELLANEOUS) ×1 IMPLANT
SPONGE GAUZE 4X4 12PLY (GAUZE/BANDAGES/DRESSINGS) ×4 IMPLANT
SPONGE LAP 4X18 X RAY DECT (DISPOSABLE) ×2 IMPLANT
SUT ETHILON 4 0 PS 2 18 (SUTURE) ×2 IMPLANT
SUT SILK 2 0 SH (SUTURE) ×2 IMPLANT
SWAB CULTURE LIQUID MINI MALE (MISCELLANEOUS) ×2 IMPLANT
SYR 20ML ECCENTRIC (SYRINGE) IMPLANT
SYR 3ML 25GX5/8 SAFETY (SYRINGE) IMPLANT
SYR CONTROL 10ML LL (SYRINGE) IMPLANT
TOWEL OR 17X24 6PK STRL BLUE (TOWEL DISPOSABLE) ×2 IMPLANT
TOWEL OR 17X26 10 PK STRL BLUE (TOWEL DISPOSABLE) ×2 IMPLANT
TUBE ANAEROBIC SPECIMEN COL (MISCELLANEOUS) ×2 IMPLANT
TUBE CONNECTING 12X1/4 (SUCTIONS) ×2 IMPLANT
WATER STERILE IRR 1000ML POUR (IV SOLUTION) ×2 IMPLANT

## 2013-01-03 NOTE — ED Notes (Addendum)
Pt reports left leg pain x 3 days- hx of gout- had left "drained" at [facility] on friday

## 2013-01-03 NOTE — ED Notes (Signed)
Pt c/o extreme thirst 1/4 cup ice chips given denies nausea. Medicated for left knee pain per order. Waiting call from orthopedics

## 2013-01-03 NOTE — Consult Note (Signed)
INFECTIOUS DISEASE CONSULT NOTE  Date of Admission:  01/03/2013  Date of Consult:  01/03/2013  Reason for Consult:Septic Arthritis Referring Physician:   Impression/Recommendation Septic Arthritis  Will start pt on vanco and ceftriaxone Check HIV, RPR, urine g/c,  Comment- suspect staph given the previous result (? Lab error). Given his age he is at risk for STDs (although he is married with kids 60, 74, 48 year old). Await studies from his surgery today. Plan for [facility] soon.  Thank you so much for this interesting consult,   Johny Sax [phone]  AZION CENTRELLA is an 47 y.o. male.  HPI: 47 yo M with hx of DM, gout and hx of prev arthroscopy in the 1980s per patient. He denies in hx of trauma to his L knee. Now comes to hospital with 4 days of pain in his L knee. He developed swelling and decreased ability to bear wt. He had an aspirate done in the ED 12-31-12 that has been finaled as no growth (prev reported Staph aureus).  He underwent I and D of his knee today.   Past Medical History  Diagnosis Date  . DM 08/08/2008  . DYSLIPIDEMIA 04/26/2009  . GOUT 10/24/2010  . HYPERTENSION 07/21/2007  . PULMONARY EMBOLISM 05/03/2009  . DEEP VENOUS THROMBOPHLEBITIS, LEG, LEFT 05/05/2009  . ALLERGIC RHINITIS 04/12/2009  . UNSPECIFIED URINARY CALCULUS 07/04/2010  . 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 ECZEMA 05/23/2010  . Long term (current) use of anticoagulants 04/03/2011  . BACK PAIN, LUMBAR 01/14/2011  . 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 Hyperglycemia   . Leukopenia   . Atrial fibrillation   . GERD (gastroesophageal reflux disease)     Past Surgical History  Procedure Date  . Anterior cruciate ligament repair 2000    Right  . Knee arthroscopy     left knee  . Knee arthroscopy w/ acl reconstruction     right knee  . Cystectomy     back of head     Allergies  Allergen Reactions  . Metformin     REACTION: nausea/diarrhea    Medications:  Scheduled:   . chlorhexidine  60 mL  Topical Once  . HYDROmorphone      . metoprolol tartrate      . mupirocin ointment   Nasal BID    Total days of antibiotics 0   Social History:  reports that he has quit smoking. He has never used smokeless tobacco. He reports that he drinks alcohol. He reports that he does not use illicit drugs.  Family History  Problem Relation Age of Onset  . Heart disease Neg Hx     no premature CAD known  . Diabetes Mother   . Hypertension Mother   . Hyperlipidemia Mother   . Diabetes Father   . Hypertension Father   . Hyperlipidemia Father     General ROS: no vision changes, no headaches, no change in apetite, has had fever at home, no change in urine, no change in BM. see HPI.   Blood pressure 153/92, pulse 107, temperature 99.2 F (37.3 C), temperature source Oral, resp. rate 22, height 5\' 10"  (1.778 m), weight 114.76 kg (253 lb), SpO2 98.00%. General appearance: alert, cooperative and no distress Eyes: negative findings: pupils equal, round, reactive to light and accomodation Throat: lips, mucosa, and tongue normal; teeth and gums normal Neck: no adenopathy and supple, symmetrical, trachea midline Lungs: clear to auscultation bilaterally Heart: regular rate and rhythm Abdomen: normal findings: bowel sounds normal and soft, non-tender  Extremities: L knee dressed. normal light touch LLE (grossly)   Results for orders placed during the hospital encounter of 01/03/13 (from the past 48 hour(s))  GLUCOSE, CAPILLARY     Status: Abnormal   Collection Time   01/03/13 11:12 AM      Component Value Range Comment   Glucose-Capillary 135 (*) 70 - 99 mg/dL   TYPE AND SCREEN     Status: Normal   Collection Time   01/03/13 12:25 PM      Component Value Range Comment   ABO/RH(D) A POS      Antibody Screen NEG      Sample Expiration 01/06/2013     ABO/RH     Status: Normal   Collection Time   01/03/13 12:25 PM      Component Value Range Comment   ABO/RH(D) A POS     SURGICAL PCR SCREEN      Status: Abnormal   Collection Time   01/03/13 12:36 PM      Component Value Range Comment   MRSA, PCR NEGATIVE  NEGATIVE    Staphylococcus aureus POSITIVE (*) NEGATIVE   GRAM STAIN     Status: Normal   Collection Time   01/03/13  2:32 PM      Component Value Range Comment   Specimen Description FLUID SYNOVIAL LEFT KNEE      Special Requests PT ON VANCO FLUID ON SWAB      Gram Stain        Value: MODERATE WBC PRESENT,BOTH PMN AND MONONUCLEAR     NO ORGANISMS SEEN   Report Status 01/03/2013 FINAL     GLUCOSE, CAPILLARY     Status: Abnormal   Collection Time   01/03/13  2:55 PM      Component Value Range Comment   Glucose-Capillary 119 (*) 70 - 99 mg/dL       Component Value Date/Time   SDES FLUID SYNOVIAL LEFT KNEE 01/03/2013 1432   SPECREQUEST PT ON VANCO FLUID ON SWAB 01/03/2013 1432   REPTSTATUS 01/03/2013 FINAL 01/03/2013 1432   Dg Chest Portable 1 View  01/03/2013  *RADIOLOGY REPORT*  Clinical Data: 47 year old male preoperative study.  History of pulmonary embolus in 2010.  PORTABLE CHEST - 1 VIEW  Comparison: 09/19/2012 chest CTA and earlier.  Findings: AP view at 1222 hours.  Mildly lower lung volumes. Cardiac size and mediastinal contours are within normal limits. Visualized tracheal air column is within normal limits.  No pneumothorax or pulmonary edema.  No pleural effusion.  Mildly increased retrocardiac opacity on the left.  IMPRESSION: Mildly increased retrocardiac opacity on the left.  Favor atelectasis, but upright lateral view would be valuable to exclude consolidation/pneumonia if possible.   Original Report Authenticated By: Erskine Speed, M.D.    Dg Knee Complete 4 Views Left  01/03/2013  *RADIOLOGY REPORT*  Clinical Data: Knee pain and swelling.  History of gout.  LEFT KNEE - COMPLETE 4+ VIEW  Comparison: None.  Findings: There is no fracture or dislocation.  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  Small joint effusion is noted. Loose bodies are  identified in the joint.  One of the largest measures 1.2 cm and is best seen on the lateral view.  IMPRESSION:  1.  No acute abnormality. 2.  Advance for age degenerative disease.   Original Report Authenticated By: Holley Dexter, M.D.    Recent Results (from the past 240 hour(s))  SURGICAL PCR SCREEN     Status: Abnormal  Collection Time   01/03/13 12:36 PM      Component Value Range Status Comment   MRSA, PCR NEGATIVE  NEGATIVE Final    Staphylococcus aureus POSITIVE (*) NEGATIVE Final   GRAM STAIN     Status: Normal   Collection Time   01/03/13  2:32 PM      Component Value Range Status Comment   Specimen Description FLUID SYNOVIAL LEFT KNEE   Final    Special Requests PT ON VANCO FLUID ON SWAB   Final    Gram Stain     Final    Value: MODERATE WBC PRESENT,BOTH PMN AND MONONUCLEAR     NO ORGANISMS SEEN   Report Status 01/03/2013 FINAL   Final       01/03/2013, 4:30 PM     LOS: 0 days

## 2013-01-03 NOTE — Progress Notes (Signed)
ANTIBIOTIC CONSULT NOTE - INITIAL  Pharmacy Consult for Vancomycin Indication: Left Knee Septic Arthritis   Allergies  Allergen Reactions  . Metformin     REACTION: nausea/diarrhea    Patient Measurements: Height: 5\' 10"  (177.8 cm) Weight: 253 lb (114.76 kg) IBW/kg (Calculated) : 73   Vital Signs: Temp: 99.7 F (37.6 C) (01/06 1709) Temp src: Oral (01/06 1709) BP: 159/94 mmHg (01/06 1709) Pulse Rate: 110  (01/06 1709) Intake/Output from previous day:   Intake/Output from this shift: Total I/O In: 1200 [I.V.:1200] Out: 700 [Urine:650; Blood:50]  Labs:  [facility] 01/03/13 0216  WBC 7.2  HGB 12.6*  PLT 224  LABCREA --  CREATININE 0.70   Estimated Creatinine Clearance: 144.8 ml/min (by C-G formula based on Cr of 0.7). No results found for this basename: VANCOTROUGH:2,VANCOPEAK:2,VANCORANDOM:2,GENTTROUGH:2,GENTPEAK:2,GENTRANDOM:2,TOBRATROUGH:2,TOBRAPEAK:2,TOBRARND:2,AMIKACINPEAK:2,AMIKACINTROU:2,AMIKACIN:2, in the last 72 hours   Microbiology: Recent Results (from the past 720 hour(s))  SURGICAL PCR SCREEN     Status: Abnormal   Collection Time   01/03/13 12:36 PM      Component Value Range Status Comment   MRSA, PCR NEGATIVE  NEGATIVE Final    Staphylococcus aureus POSITIVE (*) NEGATIVE Final   GRAM STAIN     Status: Normal   Collection Time   01/03/13  2:32 PM      Component Value Range Status Comment   Specimen Description FLUID SYNOVIAL LEFT KNEE   Final    Special Requests PT ON VANCO FLUID ON SWAB   Final    Gram Stain     Final    Value: MODERATE WBC PRESENT,BOTH PMN AND MONONUCLEAR     NO ORGANISMS SEEN   Report Status 01/03/2013 FINAL   Final     Medical History: Past Medical History  Diagnosis Date  . DM 08/08/2008  . DYSLIPIDEMIA 04/26/2009  . GOUT 10/24/2010  . HYPERTENSION 07/21/2007  . PULMONARY EMBOLISM 05/03/2009  . DEEP VENOUS THROMBOPHLEBITIS, LEG, LEFT 05/05/2009  . ALLERGIC RHINITIS 04/12/2009  . UNSPECIFIED URINARY CALCULUS 07/04/2010  .  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 ECZEMA 05/23/2010  . Long term (current) use of anticoagulants 04/03/2011  . BACK PAIN, LUMBAR 01/14/2011  . 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 Hyperglycemia   . Leukopenia   . Atrial fibrillation   . GERD (gastroesophageal reflux disease)    Assessment: 62 YOM with acute left knee pain and swelling, suspected for septic arthritis to start vancomycin and rocephin for empiric coverage. s/p I&D today cultures are pending. Tm = 100.2, wbc wnl. Scr 0.7, est. crcl > 1107ml/min. Pt. Received vancomycin 1g pre-op at 0600 this am.   Goal of Therapy:  Vancomycin trough level 15-20 mcg/ml  Plan:  - Vancomycin 2000mg  IV x 1 loading dose - Then Vancomycin 1g IV Q 12hrs - f/u renal function and cultures. - Check vancomycin trough at steady state,  Bayard Hugger, PharmD, BCPS  Clinical Pharmacist  Pager: [phone]  01/03/2013,5:14 PM

## 2013-01-03 NOTE — ED Notes (Signed)
Patient transported to X-ray 

## 2013-01-03 NOTE — Anesthesia Postprocedure Evaluation (Signed)
  Anesthesia Post-op Note  Patient: Spencer English  Procedure(s) Performed: Procedure(s) (LRB) with comments: ARTHROSCOPY KNEE (Left) - Lavage Synovectomy, Removal of loose body  Patient Location: PACU  Anesthesia Type:GA combined with regional for post-op pain  Level of Consciousness: awake and alert   Airway and Oxygen Therapy: Patient Spontanous Breathing  Post-op Pain: mild  Post-op Assessment: Post-op Vital signs reviewed, Patient's Cardiovascular Status Stable, Respiratory Function Stable, Patent Airway, No signs of Nausea or vomiting and Pain level controlled  Post-op Vital Signs: Reviewed  Complications: No apparent anesthesia complications

## 2013-01-03 NOTE — Consult Note (Signed)
[facility]  PATIENT DETAILS Name: Spencer English Age: 47 y.o. Sex: male Date of Birth: August 27, 1966 Admit Date: 01/03/2013 VOZ:[id number], Gregary Signs, MD Requesting MD: Thera Flake., MD  Date of consultation: 01/03/13  REASON FOR CONSULTATION:  Management of multiple medical comorbidities  Impression Active Problems:  DM  DYSLIPIDEMIA  GOUT  HYPERTENSION  PULMONARY EMBOLISM  DEEP VENOUS THROMBOPHLEBITIS, LEG, LEFT  Atrial fibrillation  Recommendations  1. A. Fib- INR is still therapeutic, last dose of Coumadin was on Saturday. We'll defer timing of initiation of anticoagulation to the orthopedic team. Recheck INR tomorrow, if less than 2-then if okay with orthopedics start IV heparin infusion or therapeutic Lovenox. If more than 2, could potentially restart warfarin. Please continue with Lopressor for rate control. 2. 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 As noted above, INR is still therapeutic. A repeat INR tomorrow, if less than 2, please consider starting either Lovenox heparin infusion. 3. Septic arthritis of left knee- Per Ortho team-outpatient arthrocentesis-and subsequent cultures have grown MRSA. The Ortho team is consulting infectious disease for this issue, defer management to infectious disease 4. Diabetes- agree with using SSI a while inpatient. Resume Januvia at discharge. 5. Dyslipidemia- resume statin when possible 6. Hypertension- resume Lasix, Avapro and metoprolol. Will titrate dosing depending on BP readings. 7. Gout- continue with allopurinol   DVT Prophylaxis: - Not needed as INR is therapeutic  Code Status: - Full code  The hospitalist team will followup again tomorrow. Please contact me if I can be of assistance in the meanwhile. Thank you for this consultation.  [facility] Pager [phone]  HPI: Patient is a 47 year old African American  male with a past medical history of venous thromboembolism on chronic Coumadin therapy for the past 2 years, history of atrial fibrillation, diabetes, hypertension, dyslipidemia who was admitted by the orthopedic service early this morning and taken to the OR for a septic left knee. Per the patient, last Wednesday he started developing pain and swelling of his left knee. He claims that the left knee was exquisitely tender and hot. He subsequently went to the orthopedics office and had a arthrocentesis done, intra-articular steroids was injected. Patient then continued to have pain and swelling of his left knee, he subsequently presented to the ED and was admitted by the orthopedic service. Apparently from the history obtained from the orthopedic team, outpatient synovitis fluid cultures are positive for MRSA. Patient is currently status post left knee washout, the hospitalist team has been asked to consult and comanage his medical comorbidities.  ALLERGIES:   Allergies  Allergen Reactions  . Metformin     REACTION: nausea/diarrhea    PAST MEDICAL HISTORY: Past Medical History  Diagnosis Date  . DM 08/08/2008  . DYSLIPIDEMIA 04/26/2009  . GOUT 10/24/2010  . HYPERTENSION 07/21/2007  . PULMONARY EMBOLISM 05/03/2009  . DEEP VENOUS THROMBOPHLEBITIS, LEG, LEFT 05/05/2009  . ALLERGIC RHINITIS 04/12/2009  . UNSPECIFIED URINARY CALCULUS 07/04/2010  . 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 ECZEMA 05/23/2010  . Long term (current) use of anticoagulants 04/03/2011  . BACK PAIN, LUMBAR 01/14/2011  . 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 Hyperglycemia   . Leukopenia   . Atrial fibrillation   . GERD (gastroesophageal reflux disease)     PAST SURGICAL HISTORY: Past Surgical History  Procedure Date  . Anterior cruciate ligament repair 2000    Right  . Knee arthroscopy     left knee  .  Knee arthroscopy w/ acl reconstruction     right knee  . Cystectomy     back of head    MEDICATIONS AT HOME: Prior to  Admission medications   Medication Sig Start Date End Date Taking? Authorizing Provider  acetaminophen (TYLENOL) 500 MG tablet Take 1,000 mg by mouth every 6 (six) hours as needed. For pain.   Yes Historical Provider, MD  allopurinol (ZYLOPRIM) 300 MG tablet Take 1 tablet (300 mg total) by mouth as needed. For gout flare-up. 09/06/12  Yes Romero Belling, MD  atorvastatin (LIPITOR) 20 MG tablet Take 1 tablet (20 mg total) by mouth daily. 09/06/12 09/06/13 Yes Romero Belling, MD  cephALEXin (KEFLEX) 500 MG capsule Take 1 capsule (500 mg total) by mouth 4 (four) times daily. 12/02/12  Yes Joya Gaskins, MD  cephALEXin (KEFLEX) 500 MG capsule Take 1 capsule (500 mg total) by mouth 4 (four) times daily. 01/03/13  Yes April K Palumbo-Rasch, MD  furosemide (LASIX) 40 MG tablet Take 1 tablet (40 mg total) by mouth daily. 01/12/12  Yes Romero Belling, MD  metoprolol tartrate (LOPRESSOR) 25 MG tablet Take 1 tablet (25 mg total) by mouth 2 (two) times daily. 11/24/12  Yes Peter M [place], MD  Multiple Vitamin (MULITIVITAMIN WITH MINERALS) TABS Take 1 tablet by mouth daily.   Yes Historical Provider, MD  olmesartan (BENICAR) 40 MG tablet Take 40 mg by mouth daily.     Yes Historical Provider, MD  sitaGLIPtin (JANUVIA) 100 MG tablet Take 1 tablet (100 mg total) by mouth daily. 12/20/12  Yes Carlus Pavlov, MD  doxycycline (VIBRAMYCIN) 100 MG capsule Take 1 capsule (100 mg total) by mouth 2 (two) times daily. One po bid x 7 days 01/03/13   April K Palumbo-Rasch, MD  fexofenadine (ALLEGRA) 180 MG tablet Take 180 mg by mouth daily as needed. For allergies.    Historical Provider, MD  fluticasone (FLONASE) 50 MCG/ACT nasal spray Place 2 sprays into the nose daily as needed. For allergies.    Historical Provider, MD  fluticasone (FLONASE) 50 MCG/ACT nasal spray Place 2 sprays into the nose daily. 09/19/12   Charles B. Bernette Mayers, MD  halobetasol (ULTRAVATE) 0.05 % cream APPLY  CREAM TOPICALLY THREE TIMES DAILY AS NEEDED FOR RASH  06/24/12   Romero Belling, MD  tadalafil (CIALIS) 5 MG tablet Take 5 mg by mouth daily as needed. 09/06/12   Romero Belling, MD  warfarin (COUMADIN) 5 MG tablet Take as directed by anticoagulation 09/06/12   Hillis Range, MD    FAMILY HISTORY: Family History  Problem Relation Age of Onset  . Heart disease Neg Hx     no premature CAD known  . Diabetes Mother   . Hypertension Mother   . Hyperlipidemia Mother   . Diabetes Father   . Hypertension Father   . Hyperlipidemia Father     SOCIAL HISTORY:  reports that he has quit smoking. He has never used smokeless tobacco. He reports that he drinks alcohol. He reports that he does not use illicit drugs.  REVIEW OF SYSTEMS:  Constitutional:   No  weight loss, night sweats,  Fevers, chills, fatigue.  HEENT:    No headaches, Difficulty swallowing,Tooth/dental problems,Sore throat,  No sneezing, itching, ear ache, nasal congestion, post nasal drip,   Cardio-vascular: No chest pain,  Orthopnea, PND, swelling in lower extremities, anasarca, dizziness, palpitations  GI:  No heartburn, indigestion, abdominal pain, nausea, vomiting, diarrhea, change in  bowel habits, loss of appetite  Resp: No shortness of  breath with exertion or at rest.  No excess mucus, no productive cough, No non-productive cough,  No coughing up of blood.No change in color of mucus.No wheezing.No chest wall deformity  Skin:  no rash or lesions.  GU:  no dysuria, change in color of urine, no urgency or frequency.  No flank pain.  Musculoskeletal:  No back pain.  Psych: No change in mood or affect. No depression or anxiety.  No memory loss.   PHYSICAL EXAM: Blood pressure 153/92, pulse 104, temperature 98.7 F (37.1 C), temperature source Oral, resp. rate 20, height 5\' 10"  (1.778 m), weight 114.76 kg (253 lb), SpO2 100.00%.  General appearance :Awake, alert, not in any distress. Speech Clear. Not toxic Looking HEENT: Atraumatic and Normocephalic, pupils equally  reactive to light and accomodation Neck: supple, no JVD. No cervical lymphadenopathy.  Chest:Good air entry bilaterally, no added sounds  CVS: S1 S2 regular, no murmurs. Slightly tachycardic Abdomen: Bowel sounds present, Non tender and not distended with no gaurding, rigidity or rebound. Extremities: B/L Lower Ext shows no edema, both legs are warm to touch, with  dorsalis pedis pulses palpable. Left knee area in dressing-did not open. Neurology: Awake alert, and oriented X 3, CN II-XII intact, Non focal, Deep Tendon Reflex-2+ all over, plantar's downgoing B/L, sensory exam is grossly intact.  Skin:No Rash Wounds:N/A  LABS ON ADMISSION:   Basename 01/03/13 0216  NA 137  K 4.0  CL 101  CO2 25  GLUCOSE 171*  BUN 13  CREATININE 0.70  CALCIUM 9.3  MG --  PHOS --   No results found for this basename: AST:2,ALT:2,ALKPHOS:2,BILITOT:2,PROT:2,ALBUMIN:2 in the last 72 hours No results found for this basename: LIPASE:2,AMYLASE:2 in the last 72 hours  Basename 01/03/13 0216  WBC 7.2  NEUTROABS 5.2  HGB 12.6*  HCT 39.1  MCV 74.8*  PLT 224   No results found for this basename: CKTOTAL:3,CKMB:3,CKMBINDEX:3,TROPONINI:3 in the last 72 hours No results found for this basename: DDIMER:2 in the last 72 hours No components found with this basename: POCBNP:3   RADIOLOGIC STUDIES ON ADMISSION: Dg Chest Portable 1 View  01/03/2013  *RADIOLOGY REPORT*  Clinical Data: 47 year old male preoperative study.  History of pulmonary embolus in 2010.  PORTABLE CHEST - 1 VIEW  Comparison: 09/19/2012 chest CTA and earlier.  Findings: AP view at 1222 hours.  Mildly lower lung volumes. Cardiac size and mediastinal contours are within normal limits. Visualized tracheal air column is within normal limits.  No pneumothorax or pulmonary edema.  No pleural effusion.  Mildly increased retrocardiac opacity on the left.  IMPRESSION: Mildly increased retrocardiac opacity on the left.  Favor atelectasis, but upright  lateral view would be valuable to exclude consolidation/pneumonia if possible.   Original Report Authenticated By: Erskine Speed, M.D.    Dg Knee Complete 4 Views Left  01/03/2013  *RADIOLOGY REPORT*  Clinical Data: Knee pain and swelling.  History of gout.  LEFT KNEE - COMPLETE 4+ VIEW  Comparison: None.  Findings: There is no fracture or dislocation.  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  Small joint effusion is noted. Loose bodies are identified in the joint.  One of the largest measures 1.2 cm and is best seen on the lateral view.  IMPRESSION:  1.  No acute abnormality. 2.  Advance for age degenerative disease.   Original Report Authenticated By: Holley Dexter, M.D.    Total time spent 45 minutes.  [facility] Pager [phone]  If 7PM-7AM, please contact night-coverage  [email or web address] Password [email or web address] 01/03/2013, 4:07 PM

## 2013-01-03 NOTE — Anesthesia Procedure Notes (Signed)
Procedure Name: LMA Insertion Date/Time: 01/03/2013 1:44 PM Performed by: Elizbeth Squires R Pre-anesthesia Checklist: Patient identified, Emergency Drugs available, Suction available, Patient being monitored and Timeout performed Patient Re-evaluated:Patient Re-evaluated prior to inductionOxygen Delivery Method: Circle system utilized Preoxygenation: Pre-oxygenation with 100% oxygen Intubation Type: IV induction LMA: LMA inserted LMA Size: 5.0 Tube type: Oral Number of attempts: 1 Placement Confirmation: positive ETCO2 and breath sounds checked- equal and bilateral Tube secured with: Tape Dental Injury: Teeth and Oropharynx as per pre-operative assessment

## 2013-01-03 NOTE — ED Provider Notes (Signed)
History     CSN: [id number]  Arrival date & time 01/03/13  7829   First MD Initiated Contact with Patient 01/03/13 0109      Chief Complaint  Patient presents with  . Knee Pain    (Consider location/radiation/quality/duration/timing/severity/associated sxs/prior treatment) Patient is a 47 y.o. male presenting with knee pain. The history is provided by the patient.  Knee Pain This is a new problem. The current episode started more than 1 week ago. The problem occurs constantly. The problem has been gradually worsening. Pertinent negatives include no chest pain, no abdominal pain, no headaches and no shortness of breath. Nothing aggravates the symptoms. Treatments tried: narcotic pain meds. The treatment provided no relief.    Past Medical History  Diagnosis Date  . DM 08/08/2008  . DYSLIPIDEMIA 04/26/2009  . GOUT 10/24/2010  . HYPERTENSION 07/21/2007  . PULMONARY EMBOLISM 05/03/2009  . DEEP VENOUS THROMBOPHLEBITIS, LEG, LEFT 05/05/2009  . ALLERGIC RHINITIS 04/12/2009  . UNSPECIFIED URINARY CALCULUS 07/04/2010  . 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 ECZEMA 05/23/2010  . Long term (current) use of anticoagulants 04/03/2011  . BACK PAIN, LUMBAR 01/14/2011  . 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 Hyperglycemia   . Leukopenia   . Atrial fibrillation     Past Surgical History  Procedure Date  . Anterior cruciate ligament repair 2000    Right    Family History  Problem Relation Age of Onset  . Heart disease Neg Hx     no premature CAD known  . Diabetes Mother   . Hypertension Mother   . Hyperlipidemia Mother   . Diabetes Father   . Hypertension Father   . Hyperlipidemia Father     History  Substance Use Topics  . Smoking status: Former Games developer  . Smokeless tobacco: Never Used  . Alcohol Use: Yes     Comment: Occ.      Review of Systems  Constitutional: Positive for fever.  Respiratory: Negative for shortness of breath.   Cardiovascular: Negative for chest pain.    Gastrointestinal: Negative for abdominal pain.  Musculoskeletal: Positive for arthralgias.  Neurological: Negative for headaches.  All other systems reviewed and are negative.    Allergies  Metformin  Home Medications   Current Outpatient Rx  Name  Route  Sig  Dispense  Refill  . ACETAMINOPHEN 500 MG PO TABS   Oral   Take 1,000 mg by mouth every 6 (six) hours as needed. For pain.         Marland Kitchen ALLOPURINOL 300 MG PO TABS   Oral   Take 1 tablet (300 mg total) by mouth as needed. For gout flare-up.   30 tablet   11   . ATORVASTATIN CALCIUM 20 MG PO TABS   Oral   Take 1 tablet (20 mg total) by mouth daily.   90 tablet   3   . FEXOFENADINE HCL 180 MG PO TABS   Oral   Take 180 mg by mouth daily as needed. For allergies.         Marland Kitchen FLUTICASONE PROPIONATE 50 MCG/ACT NA SUSP   Nasal   Place 2 sprays into the nose daily as needed. For allergies.         Marland Kitchen FLUTICASONE PROPIONATE 50 MCG/ACT NA SUSP   Nasal   Place 2 sprays into the nose daily.   16 g   2   . FUROSEMIDE 40 MG PO TABS   Oral   Take 1 tablet (40 mg total) by mouth daily.  30 tablet   5   . HALOBETASOL PROPIONATE 0.05 % EX CREA      APPLY  CREAM TOPICALLY THREE TIMES DAILY AS NEEDED FOR RASH   50 g   2   . METOPROLOL TARTRATE 25 MG PO TABS   Oral   Take 1 tablet (25 mg total) by mouth 2 (two) times daily.   60 tablet   6   . ADULT MULTIVITAMIN W/MINERALS CH   Oral   Take 1 tablet by mouth daily.         Marland Kitchen OLMESARTAN MEDOXOMIL 40 MG PO TABS   Oral   Take 40 mg by mouth daily.           Marland Kitchen SITAGLIPTIN PHOSPHATE 100 MG PO TABS   Oral   Take 1 tablet (100 mg total) by mouth daily.   30 tablet   0     PT NEEDS TO SCHEDULE FOLLOW UP APPT   . TADALAFIL 5 MG PO TABS   Oral   Take 5 mg by mouth daily as needed.         . WARFARIN SODIUM 5 MG PO TABS      Take as directed by anticoagulation   50 tablet   1   . CEPHALEXIN 500 MG PO CAPS   Oral   Take 1 capsule (500 mg total) by  mouth 4 (four) times daily.   28 capsule   0     BP 149/86  Pulse 124  Temp 100.2 F (37.9 C) (Oral)  Resp 28  SpO2 96%  Physical Exam  Constitutional: He is oriented to person, place, and time. He appears well-developed and well-nourished. No distress.  HENT:  Head: Normocephalic and atraumatic.  Mouth/Throat: Oropharynx is clear and moist.  Eyes: Conjunctivae normal are normal. Pupils are equal, round, and reactive to light.  Cardiovascular: Normal rate, regular rhythm and intact distal pulses.   Pulmonary/Chest: Effort normal and breath sounds normal.  Abdominal: Soft. Bowel sounds are normal.  Musculoskeletal: Normal range of motion.       Mild swelling of the left knee negative anterior and posterior drawer test.  Knee is warmth without erythema nor fluctuance.  Intact sensation and cap refill to the distal extremity.  Sensation and motor intact  Neurological: He is alert and oriented to person, place, and time. He has normal reflexes.  Skin: Skin is warm and dry.  Psychiatric: He has a normal mood and affect.    ED Course  Procedures (including critical care time)  Labs Reviewed  CBC WITH DIFFERENTIAL - Abnormal; Notable for the following:    Hemoglobin 12.6 (*)     MCV 74.8 (*)     MCH 24.1 (*)     Monocytes Relative 13 (*)     All other components within normal limits  BASIC METABOLIC PANEL - Abnormal; Notable for the following:    Glucose, Bld 171 (*)     All other components within normal limits  PROTIME-INR - Abnormal; Notable for the following:    Prothrombin Time 26.7 (*)     INR 2.62 (*)     All other components within normal limits  CULTURE, BLOOD (SINGLE)   Dg Knee Complete 4 Views Left  01/03/2013  *RADIOLOGY REPORT*  Clinical Data: Knee pain and swelling.  History of gout.  LEFT KNEE - COMPLETE 4+ VIEW  Comparison: None.  Findings: There is no fracture or dislocation.  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  Small joint  effusion is noted. Loose bodies are identified in the joint.  One of the largest measures 1.2 cm and is best seen on the lateral view.  IMPRESSION:  1.  No acute abnormality. 2.  Advance for age degenerative disease.   Original Report Authenticated By: Holley Dexter, M.D.      No diagnosis found.    MDM  89 am case d/w Dr. Madelon Lips, does not feel the knee is infected despite fever.  Give IV antibiotics and come to the office this am for retap and review of previous knee aspiration results.        Discuss antibiotics with your orthopedic surgeon and you will need your INR checked every few days while on antibiotics.  Patient and wife verbalize understanding and agree to follow up  Damaso Laday Smitty Cords, MD 01/03/13 [phone]

## 2013-01-03 NOTE — H&P (Signed)
Spencer English is an 47 y.o. male.   Chief Complaint: Septic left knee HPI: Pt is a 47yo male, hx of afib, DM, HTN, gout, presented to outpatient clinic last Friday with approx 1 week of acute onset left knee pain and swelling.  Knee was aspirated sent for crystals with hx of CPPD, did not culture, injected with corticosteroid.  He was given po pain meds.  He later presented to the ED similar complaints of knee pain, aspirated sent for culture, preliminary report shows rare staph aureus.  Follow up in outpatient clinic this morning for evaluation.  Patient very painful left knee, c/o swelling, difficulty weight bearing or bending knee.  Denies any recent trauma.  Believe he was given IV ceftriaxone and vanc in the ED.    Past Medical History  Diagnosis Date  . DM 08/08/2008  . DYSLIPIDEMIA 04/26/2009  . GOUT 10/24/2010  . HYPERTENSION 07/21/2007  . PULMONARY EMBOLISM 05/03/2009  . DEEP VENOUS THROMBOPHLEBITIS, LEG, LEFT 05/05/2009  . ALLERGIC RHINITIS 04/12/2009  . UNSPECIFIED URINARY CALCULUS 07/04/2010  . 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 ECZEMA 05/23/2010  . Long term (current) use of anticoagulants 04/03/2011  . BACK PAIN, LUMBAR 01/14/2011  . 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 Hyperglycemia   . Leukopenia   . Atrial fibrillation     Past Surgical History  Procedure Date  . Anterior cruciate ligament repair 2000    Right    Family History  Problem Relation Age of Onset  . Heart disease Neg Hx     no premature CAD known  . Diabetes Mother   . Hypertension Mother   . Hyperlipidemia Mother   . Diabetes Father   . Hypertension Father   . Hyperlipidemia Father    Social History:  reports that he has quit smoking. He has never used smokeless tobacco. He reports that he drinks alcohol. He reports that he does not use illicit drugs.  Allergies:  Allergies  Allergen Reactions  . Metformin     REACTION: nausea/diarrhea   Meidcations reviewed from recent ED visit, reviewed home med list.     (Not in a hospital admission)  Results for orders placed during the hospital encounter of 01/03/13 (from the past 48 hour(s))  CBC WITH DIFFERENTIAL     Status: Abnormal   Collection Time   01/03/13  2:16 AM      Component Value Range Comment   WBC 7.2  4.0 - 10.5 K/uL    RBC 5.23  4.22 - 5.81 MIL/uL    Hemoglobin 12.6 (*) 13.0 - 17.0 g/dL    HCT 16.1  09.6 - 04.5 %    MCV 74.8 (*) 78.0 - 100.0 fL    MCH 24.1 (*) 26.0 - 34.0 pg    MCHC 32.2  30.0 - 36.0 g/dL    RDW 40.9  81.1 - 91.4 %    Platelets 224  150 - 400 K/uL    Neutrophils Relative 72  43 - 77 %    Neutro Abs 5.2  1.7 - 7.7 K/uL    Lymphocytes Relative 15  12 - 46 %    Lymphs Abs 1.1  0.7 - 4.0 K/uL    Monocytes Relative 13 (*) 3 - 12 %    Monocytes Absolute 0.9  0.1 - 1.0 K/uL    Eosinophils Relative 0  0 - 5 %    Eosinophils Absolute 0.0  0.0 - 0.7 K/uL    Basophils Relative 0  0 - 1 %  Basophils Absolute 0.0  0.0 - 0.1 K/uL   BASIC METABOLIC PANEL     Status: Abnormal   Collection Time   01/03/13  2:16 AM      Component Value Range Comment   Sodium 137  135 - 145 mEq/L    Potassium 4.0  3.5 - 5.1 mEq/L    Chloride 101  96 - 112 mEq/L    CO2 25  19 - 32 mEq/L    Glucose, Bld 171 (*) 70 - 99 mg/dL    BUN 13  6 - 23 mg/dL    Creatinine, Ser 1.61  0.50 - 1.35 mg/dL    Calcium 9.3  8.4 - 09.6 mg/dL    GFR calc non Af Amer >90  >90 mL/min    GFR calc Af Amer >90  >90 mL/min   PROTIME-INR     Status: Abnormal   Collection Time   01/03/13  2:16 AM      Component Value Range Comment   Prothrombin Time 26.7 (*) 11.6 - 15.2 seconds    INR 2.62 (*) 0.00 - 1.49    Dg Knee Complete 4 Views Left  01/03/2013  *RADIOLOGY REPORT*  Clinical Data: Knee pain and swelling.  History of gout.  LEFT KNEE - COMPLETE 4+ VIEW  Comparison: None.  Findings: There is no fracture or dislocation.  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  Small joint effusion is noted. Loose bodies are identified in the joint.  One  of the largest measures 1.2 cm and is best seen on the lateral view.  IMPRESSION:  1.  No acute abnormality. 2.  Advance for age degenerative disease.   Original Report Authenticated By: Holley Dexter, M.D.     Review of Systems  Constitutional: Positive for fever and chills. Negative for weight loss, malaise/fatigue and diaphoresis.  HENT: Negative.  Negative for neck pain.   Eyes: Negative.   Respiratory: Negative for cough, hemoptysis, sputum production, shortness of breath and wheezing.   Cardiovascular: Positive for palpitations and leg swelling. Negative for chest pain and orthopnea.       Rapid HR  Gastrointestinal: Negative.   Genitourinary: Negative.   Musculoskeletal: Positive for joint pain. Negative for back pain and falls.  Skin: Positive for itching. Negative for rash.  Neurological: Positive for dizziness. Negative for tingling, sensory change, speech change, focal weakness, seizures, loss of consciousness and weakness.  Endo/Heme/Allergies: Negative for polydipsia. Bruises/bleeds easily.  Psychiatric/Behavioral: Negative for depression and substance abuse. The patient is nervous/anxious.     There were no vitals taken for this visit. Physical Exam  Constitutional: He is oriented to person, place, and time. He appears well-developed and well-nourished.  HENT:  Head: Normocephalic and atraumatic.  Nose: Nose normal.  Eyes: Conjunctivae normal and EOM are normal. Pupils are equal, round, and reactive to light.  Neck: Normal range of motion. Neck supple.  Cardiovascular: Intact distal pulses.        Irregularly irregular rate and rhythm hx of afib  Respiratory: Effort normal and breath sounds normal. No respiratory distress.  GI: Soft. Bowel sounds are normal. He exhibits no distension. There is no tenderness.  Musculoskeletal: He exhibits edema and tenderness.       Left knee: He exhibits swelling and effusion.  Neurological: He is alert and oriented to person, place,  and time. No cranial nerve deficit.  Skin: Skin is warm and dry. He is not diaphoretic. No erythema.  Psychiatric: He has a normal mood and  affect. His behavior is normal.     Assessment/Plan Septic left knee  Plan is for direct admit surgery today, Irrigation and debridement arthroscopic lavage, risks and benefits were discussed and pt wishes to proceed.  Plan for consult of infectious disease and hospitalist for assistance of medical management while inpatient.     Margart Sickles 01/03/2013, 9:51 AM

## 2013-01-03 NOTE — Procedures (Signed)
Nurse called Dr. Madelon Lips to inform him of patients INR. He stated "I know his INR is elevated but he needs to have this surgery done because he has a infection, so tell Anesthesia that." Will proceed as planned.

## 2013-01-03 NOTE — Anesthesia Preprocedure Evaluation (Signed)
Anesthesia Evaluation  Patient identified by MRN, date of birth, ID band Patient awake    Reviewed: Allergy & Precautions, H&P , NPO status , Patient's Chart, lab work & pertinent test results  Airway Mallampati: II TM Distance: >3 FB Neck ROM: Full    Dental No notable dental hx. (+) Teeth Intact and Dental Advisory Given   Pulmonary neg pulmonary ROS,  breath sounds clear to auscultation  Pulmonary exam normal       Cardiovascular hypertension, On Medications and On Home Beta Blockers + Peripheral Vascular Disease negative cardio ROS  + dysrhythmias Atrial Fibrillation Rhythm:Regular Rate:Normal     Neuro/Psych PSYCHIATRIC DISORDERS negative neurological ROS     GI/Hepatic Neg liver ROS, GERD-  Medicated and Controlled,  Endo/Other  diabetes, Well Controlled, Type 2, Oral Hypoglycemic Agents  Renal/GU negative Renal ROS  negative genitourinary   Musculoskeletal   Abdominal   Peds  Hematology negative hematology ROS (+)   Anesthesia Other Findings   Reproductive/Obstetrics negative OB ROS                           Anesthesia Physical Anesthesia Plan  ASA: III  Anesthesia Plan: General   Post-op Pain Management:    Induction: Intravenous  Airway Management Planned: LMA  Additional Equipment:   Intra-op Plan:   Post-operative Plan: Extubation in OR  Informed Consent: I have reviewed the patients History and Physical, chart, labs and discussed the procedure including the risks, benefits and alternatives for the proposed anesthesia with the patient or authorized representative who has indicated his/her understanding and acceptance.   Dental advisory given  Plan Discussed with: CRNA  Anesthesia Plan Comments:         Anesthesia Quick Evaluation

## 2013-01-03 NOTE — Transfer of Care (Signed)
Immediate Anesthesia Transfer of Care Note  Patient: Spencer English  Procedure(s) Performed: Procedure(s) (LRB) with comments: ARTHROSCOPY KNEE (Left) - Lavage Synovectomy, Removal of loose body  Patient Location: PACU  Anesthesia Type:General  Level of Consciousness: awake, alert  and oriented  Airway & Oxygen Therapy: Patient Spontanous Breathing and Patient connected to nasal cannula oxygen  Post-op Assessment: Report given to PACU RN, Post -op Vital signs reviewed and stable and Patient moving all extremities  Post vital signs: Reviewed and stable  Complications: No apparent anesthesia complications

## 2013-01-04 ENCOUNTER — Encounter (HOSPITAL_COMMUNITY): Payer: Self-pay | Admitting: Orthopedic Surgery

## 2013-01-04 DIAGNOSIS — R609 Edema, unspecified: Secondary | ICD-10-CM

## 2013-01-04 DIAGNOSIS — J309 Allergic rhinitis, unspecified: Secondary | ICD-10-CM

## 2013-01-04 LAB — GLUCOSE, CAPILLARY
Glucose-Capillary: 142 mg/dL — ABNORMAL HIGH (ref 70–99)
Glucose-Capillary: 149 mg/dL — ABNORMAL HIGH (ref 70–99)
Glucose-Capillary: 153 mg/dL — ABNORMAL HIGH (ref 70–99)
Glucose-Capillary: 163 mg/dL — ABNORMAL HIGH (ref 70–99)
Glucose-Capillary: 195 mg/dL — ABNORMAL HIGH (ref 70–99)

## 2013-01-04 LAB — HIV ANTIBODY (ROUTINE TESTING W REFLEX): HIV: NONREACTIVE

## 2013-01-04 LAB — PROTIME-INR
INR: 1.82 — ABNORMAL HIGH (ref 0.00–1.49)
Prothrombin Time: 20.4 seconds — ABNORMAL HIGH (ref 11.6–15.2)

## 2013-01-04 LAB — BASIC METABOLIC PANEL
CO2: 25 mEq/L (ref 19–32)
Calcium: 8.8 mg/dL (ref 8.4–10.5)
Chloride: 101 mEq/L (ref 96–112)
Glucose, Bld: 155 mg/dL — ABNORMAL HIGH (ref 70–99)
Potassium: 4.2 mEq/L (ref 3.5–5.1)
Sodium: 136 mEq/L (ref 135–145)

## 2013-01-04 LAB — CBC
Hemoglobin: 11.2 g/dL — ABNORMAL LOW (ref 13.0–17.0)
Platelets: 188 10*3/uL (ref 150–400)
RBC: 4.62 MIL/uL (ref 4.22–5.81)
WBC: 7 10*3/uL (ref 4.0–10.5)

## 2013-01-04 MED ORDER — WARFARIN SODIUM 10 MG PO TABS
10.0000 mg | ORAL_TABLET | Freq: Once | ORAL | Status: AC
  Administered 2013-01-04: 10 mg via ORAL
  Filled 2013-01-04: qty 1

## 2013-01-04 MED ORDER — ENOXAPARIN SODIUM 120 MG/0.8ML ~~LOC~~ SOLN
120.0000 mg | Freq: Two times a day (BID) | SUBCUTANEOUS | Status: DC
  Administered 2013-01-04 – 2013-01-05 (×4): 120 mg via SUBCUTANEOUS
  Filled 2013-01-04 (×6): qty 0.8

## 2013-01-04 MED ORDER — WARFARIN - PHARMACIST DOSING INPATIENT: Freq: Every day | Status: DC

## 2013-01-04 NOTE — Progress Notes (Signed)
ANTICOAGULATION CONSULT NOTE - Initial Consult  Pharmacy Consult for Lovenox and Coumadin Indication: atrial fibrillation, history of pulmonary embolus and DVT  Allergies  Allergen Reactions  . Metformin     REACTION: nausea/diarrhea    Patient Measurements: Height: 5\' 10"  (177.8 cm) Weight: 253 lb (114.76 kg) IBW/kg (Calculated) : 73   Vital Signs: Temp: 99.1 F (37.3 C) (01/07 0545) BP: 129/64 mmHg (01/07 0545) Pulse Rate: 108  (01/07 0545)  Labs:  Basename 01/04/13 0630 01/03/13 0216  HGB 11.2* 12.6*  HCT 34.6* 39.1  PLT 188 224  APTT -- --  LABPROT 20.4* 26.7*  INR 1.82* 2.62*  HEPARINUNFRC -- --  CREATININE 0.72 0.70  CKTOTAL -- --  CKMB -- --  TROPONINI -- --    Estimated Creatinine Clearance: 144.8 ml/min (by C-G formula based on Cr of 0.72).   Medical History: Past Medical History  Diagnosis Date  . DM 08/08/2008  . DYSLIPIDEMIA 04/26/2009  . GOUT 10/24/2010  . HYPERTENSION 07/21/2007  . PULMONARY EMBOLISM 05/03/2009  . DEEP VENOUS THROMBOPHLEBITIS, LEG, LEFT 05/05/2009  . ALLERGIC RHINITIS 04/12/2009  . UNSPECIFIED URINARY CALCULUS 07/04/2010  . 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 ECZEMA 05/23/2010  . Long term (current) use of anticoagulants 04/03/2011  . BACK PAIN, LUMBAR 01/14/2011  . 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 Hyperglycemia   . Leukopenia   . Atrial fibrillation   . GERD (gastroesophageal reflux disease)     Medications:  Prescriptions prior to admission  Medication Sig Dispense Refill  . acetaminophen (TYLENOL) 500 MG tablet Take 1,000 mg by mouth every 6 (six) hours as needed. For pain.      Marland Kitchen allopurinol (ZYLOPRIM) 300 MG tablet Take 1 tablet (300 mg total) by mouth as needed. For gout flare-up.  30 tablet  11  . atorvastatin (LIPITOR) 20 MG tablet Take 1 tablet (20 mg total) by mouth daily.  90 tablet  3  . doxycycline (VIBRAMYCIN) 100 MG capsule Take 1 capsule (100 mg total) by mouth 2 (two) times daily. One po bid x 7 days  14 capsule   0  . furosemide (LASIX) 40 MG tablet Take 1 tablet (40 mg total) by mouth daily.  30 tablet  5  . halobetasol (ULTRAVATE) 0.05 % cream APPLY  CREAM TOPICALLY THREE TIMES DAILY AS NEEDED FOR RASH  50 g  2  . metoprolol tartrate (LOPRESSOR) 25 MG tablet Take 1 tablet (25 mg total) by mouth 2 (two) times daily.  60 tablet  6  . Multiple Vitamin (MULITIVITAMIN WITH MINERALS) TABS Take 1 tablet by mouth daily.      Marland Kitchen olmesartan (BENICAR) 40 MG tablet Take 40 mg by mouth daily.        . sitaGLIPtin (JANUVIA) 100 MG tablet Take 1 tablet (100 mg total) by mouth daily.  30 tablet  0  . tadalafil (CIALIS) 5 MG tablet Take 5 mg by mouth daily as needed.      . warfarin (COUMADIN) 5 MG tablet Take 7.5 mg by mouth daily.      . fexofenadine (ALLEGRA) 180 MG tablet Take 180 mg by mouth daily as needed. For allergies.      . fluticasone (FLONASE) 50 MCG/ACT nasal spray Place 2 sprays into the nose daily as needed. For allergies.        Assessment: 47 yo M admitted 01/03/13 with acute L knee pain and swelling, suspected septic arthritis.  Pt is s/p I&D and washout 01/03/13.  Cultures pending.  Pt was on  Coumadin PTA for hx PE/DVT and AFib.  Last Coumadin dose was 1/4 and as a result INR is subtherapeutic at 1.82.  Asked to begin Lovenox as bridge therapy until INR therapeutic with Coumadin.    Home Coumadin dose = 7.5mg  daily.  Goal of Therapy:  INR 2-3 Monitor platelets by anticoagulation protocol: Yes   Plan:  Lovenox 120 mg SQ Q12h. Coumadin 10mg  PO x 1 tonight. Daily INR. CBC Q72 hours.  [employer], Pharm.D., BCPS Clinical Pharmacist Pager [phone] 01/04/2013 9:53 AM

## 2013-01-04 NOTE — Progress Notes (Signed)
Utilization review completed. Palin Tristan, RN, BSN. 

## 2013-01-04 NOTE — Progress Notes (Signed)
Subjective: 1 Day Post-Op Procedure(s) (LRB): ARTHROSCOPY KNEE (Left) Patient reports pain as mild and moderate.    Objective: Vital signs in last 24 hours: Temp:  [98.7 F (37.1 C)-99.8 F (37.7 C)] 99.1 F (37.3 C) (01/07 0545) Pulse Rate:  [100-124] 108  (01/07 0545) Resp:  [15-22] 20  (01/07 0545) BP: (121-165)/(64-97) 129/64 mmHg (01/07 0545) SpO2:  [96 %-100 %] 98 % (01/07 0545) Weight:  [114.76 kg (253 lb)] 114.76 kg (253 lb) (01/06 1105)  Intake/Output from previous day: 01/06 0701 - 01/07 0700 In: 1350 [I.V.:1350] Out: 1950 [Urine:1900; Blood:50] Intake/Output this shift:     Basename 01/04/13 0630 01/03/13 0216  HGB 11.2* 12.6*    Basename 01/04/13 0630 01/03/13 0216  WBC 7.0 7.2  RBC 4.62 5.23  HCT 34.6* 39.1  PLT 188 224    Basename 01/04/13 0630 01/03/13 0216  NA 136 137  K 4.2 4.0  CL 101 101  CO2 25 25  BUN 9 13  CREATININE 0.72 0.70  GLUCOSE 155* 171*  CALCIUM 8.8 9.3    Basename 01/04/13 0630 01/03/13 0216  LABPT -- --  INR 1.82* 2.62*    Neurovascular intact Sensation intact distally Intact pulses distally Dorsiflexion/Plantar flexion intact Incision: dressing C/D/I Compartment soft  Assessment/Plan: 1 Day Post-Op Procedure(s) (LRB): ARTHROSCOPY KNEE (Left) Continue antibiotics, recommendations for home could possibly d/c home tomorrow Bridge back to his coumadin with lovenox for afib Dressing change tomorrow, monitor drain output q8hrs Continue pain control  Up with PT, WBAT   May Manrique 01/04/2013, 9:37 AM

## 2013-01-04 NOTE — Progress Notes (Signed)
[facility] PROGRESS NOTE  ISAM UNREIN NWG:[id number] DOB: [birth year] DOA: 01/03/2013 PCP: Romero Belling, MD  Assessment/Plan: 1. A. Fib- Rate still up.  B-Blocker started last night.  If still elevated by tomorrow, increase dose.  INR subtherapeutic today so started on Lovenox. 2. Venous thromboembolism- patient has a history of DVT and pulmonary embolism, he is on Coumadin for the past 3 years.Subtherapeutic INR today & started on Lovenox 3. Septic arthritis of left knee- Per Ortho team-outpatient arthrocentesis-and subsequent cultures have grown MRSA. The Ortho team is consulting infectious disease for this issue, defer management to infectious disease, On Vanco 4. Diabetes- agree with using SSI a while inpatient. Resume Januvia at discharge. CBG in 140-150's/ 5. Dyslipidemia- resume statin when possible 6. Hypertension- resume Lasix, Avapro and metoprolol.BP much better today 7. Gout- continue with allopurinol   Code Status: FULL  Disposition Plan: Poss D/C home tomorrow w/outpt PT   Consultants:  ID  [facility]  Procedures:  S/p arthtroscopy of L knee  Antibiotics:  IV Rocephin & Vanco D2  HPI/Subjective: Doing ok.  Pain controlled  Objective: Filed Vitals:   01/03/13 2200 01/04/13 0200 01/04/13 0545 01/04/13 1400  BP: 138/77 121/64 129/64 127/70  Pulse: 124 116 108 105  Temp: 98.7 F (37.1 C) 99.8 F (37.7 C) 99.1 F (37.3 C) 99.4 F (37.4 C)  TempSrc:      Resp: 20 20 20 18   Height:      Weight:      SpO2: 100% 96% 98% 99%    Intake/Output Summary (Last 24 hours) at 01/04/13 1609 Last data filed at 01/04/13 1501  Gross per 24 hour  Intake    150 ml  Output   2950 ml  Net  -2800 ml   Filed Weights   01/03/13 1105  Weight: 114.76 kg (253 lb)    Exam:   General:  A&) x 3, NAD  Cardiovascular: RRR S1S2  Respiratory: CTA Bilat  Abdomen: Soft, NT, ND, +BS  Ext: No edema, left knee dressed  Data Reviewed: Basic Metabolic  Panel:  Lab 01/04/13 0630 01/03/13 0216  NA 136 137  K 4.2 4.0  CL 101 101  CO2 25 25  GLUCOSE 155* 171*  BUN 9 13  CREATININE 0.72 0.70  CALCIUM 8.8 9.3  MG -- --  PHOS -- --   CBC:  Lab 01/04/13 0630 01/03/13 0216  WBC 7.0 7.2  NEUTROABS -- 5.2  HGB 11.2* 12.6*  HCT 34.6* 39.1  MCV 74.9* 74.8*  PLT 188 224   CBG:  Lab 01/04/13 1219 01/04/13 0630 01/04/13 0030 01/03/13 1455 01/03/13 1112  GLUCAP 149* 142* 153* 119* 135*    Recent Results (from the past 240 hour(s))  CULTURE, BLOOD (SINGLE)     Status: Normal (Preliminary result)   Collection Time   01/03/13  2:00 AM      Component Value Range Status Comment   Specimen Description BLOOD RIGHT FOREARM   Final    Special Requests BOTTLES DRAWN AEROBIC AND ANAEROBIC 5CC EACH   Final    Culture  Setup Time 01/03/2013 18:09   Final    Culture     Final    Value:        BLOOD CULTURE RECEIVED NO GROWTH TO DATE CULTURE WILL BE HELD FOR 5 DAYS BEFORE ISSUING A FINAL NEGATIVE REPORT   Report Status PENDING   Incomplete   SURGICAL PCR SCREEN     Status: Abnormal   Collection Time  01/03/13 12:36 PM      Component Value Range Status Comment   MRSA, PCR NEGATIVE  NEGATIVE Final    Staphylococcus aureus POSITIVE (*) NEGATIVE Final   BODY FLUID CULTURE     Status: Normal (Preliminary result)   Collection Time   01/03/13  2:32 PM      Component Value Range Status Comment   Specimen Description FLUID SYNOVIAL LEFT KNEE   Final    Special Requests PT ON VANCO   Final    Gram Stain     Final    Value: MODERATE WBC PRESENT,BOTH PMN AND MONONUCLEAR     NO ORGANISMS SEEN     Performed at [facility]   Culture NO GROWTH 1 DAY   Final    Report Status PENDING   Incomplete   ANAEROBIC CULTURE     Status: Normal (Preliminary result)   Collection Time   01/03/13  2:32 PM      Component Value Range Status Comment   Specimen Description FLUID SYNOVIAL LEFT KNEE   Final    Special Requests PT ON VANCO FLUID ON SWAB   Final     Gram Stain     Final    Value: MODERATE WBC PRESENT,BOTH PMN AND MONONUCLEAR     NO ORGANISMS SEEN     Performed at [facility]   Culture     Final    Value: NO ANAEROBES ISOLATED; CULTURE IN PROGRESS FOR 5 DAYS   Report Status PENDING   Incomplete   GRAM STAIN     Status: Normal   Collection Time   01/03/13  2:32 PM      Component Value Range Status Comment   Specimen Description FLUID SYNOVIAL LEFT KNEE   Final    Special Requests PT ON VANCO FLUID ON SWAB   Final    Gram Stain     Final    Value: MODERATE WBC PRESENT,BOTH PMN AND MONONUCLEAR     NO ORGANISMS SEEN   Report Status 01/03/2013 FINAL   Final      Studies: Dg Chest Portable 1 View  01/03/2013   IMPRESSION: Mildly increased retrocardiac opacity on the left.  Favor atelectasis, but upright lateral view would be valuable to exclude consolidation/pneumonia if possible.   Original Report Authenticated By: Erskine Speed, M.D.    Dg Knee Complete 4 Views Left  01/03/2013   IMPRESSION:  1.  No acute abnormality. 2.  Advance for age degenerative disease.   Original Report Authenticated By: Holley Dexter, M.D.     Scheduled Meds:   . atorvastatin  20 mg Oral q1800  . cefTRIAXone (ROCEPHIN)  IV  1 g Intravenous Q24H  . docusate sodium  100 mg Oral BID  . enoxaparin (LOVENOX) injection  120 mg Subcutaneous BID  . furosemide  40 mg Oral Daily  . insulin aspart  0-15 Units Subcutaneous TID WC  . insulin aspart  0-5 Units Subcutaneous QHS  . insulin aspart  4 Units Subcutaneous TID WC  . irbesartan  300 mg Oral Daily  . metoprolol tartrate  25 mg Oral BID  . multivitamin with minerals  1 tablet Oral Daily  . mupirocin ointment   Nasal BID  . vancomycin  1,000 mg Intravenous Q12H  . warfarin  10 mg Oral ONCE-1800  . Warfarin - Pharmacist Dosing Inpatient   Does not apply q1800   Continuous Infusions:   . sodium chloride 75 mL/hr at 01/03/13 1625  Active Problems:  DM  DYSLIPIDEMIA  GOUT  HYPERTENSION   PULMONARY EMBOLISM  DEEP VENOUS THROMBOPHLEBITIS, LEG, LEFT  Atrial fibrillation    Time spent: 20 min    Hollice Espy  [facility] Pager [phone]. If 8PM-8AM, please contact night-coverage at [email or web address], password [facility] 01/04/2013, 4:09 PM  LOS: 1 day

## 2013-01-04 NOTE — Evaluation (Signed)
Physical Therapy Evaluation Patient Details Name: Spencer English MRN: [id number] DOB: 05/10/1966 Today's Date: 01/04/2013 Time: 4098-1191 PT Time Calculation (min): 44 min  PT Assessment / Plan / Recommendation Clinical Impression  Pt is a 47 y/o male s/p arthroscopic debridment of L knee. Pt mobility mostly limited by pain. Acute PT to follow pt to progress mobility. Pt should be able to perform at modified Independent level with crutches by d/c.     PT Assessment  Patient needs continued PT services    Follow Up Recommendations  Outpatient PT    Does the patient have the potential to tolerate intense rehabilitation      Barriers to Discharge        Equipment Recommendations  None recommended by PT    Recommendations for Other Services     Frequency Min 6X/week    Precautions / Restrictions Precautions Precautions: None Restrictions Weight Bearing Restrictions: Yes LLE Weight Bearing: Weight bearing as tolerated   Pertinent Vitals/Pain 9/10 pain in knee.  Pt medicated prior to start of session.  ^6/10 pain at end of session.        Mobility  Bed Mobility Bed Mobility: Supine to Sit;Sitting - Scoot to Edge of Bed Supine to Sit: 4: Min assist Sitting - Scoot to Edge of Bed: 5: Supervision Details for Bed Mobility Assistance: Assist for L LE secondary to pain.  Transfers Transfers: Sit to Stand;Stand to Sit Sit to Stand: 4: Min assist;From elevated surface;With upper extremity assist;From bed Stand to Sit: 4: Min guard;With upper extremity assist;To chair/3-in-1 Details for Transfer Assistance: Cues for hand placement and WBAT on L LE.  Assist to position L LE to initiate standing and sitting.   Ambulation/Gait Ambulation/Gait Assistance: 4: Min guard Ambulation Distance (Feet): 60 Feet Assistive device: Rolling walker Gait Pattern: Step-to pattern Stairs: No Wheelchair Mobility Wheelchair Mobility: No    Shoulder Instructions     Exercises Total Joint  Exercises Ankle Circles/Pumps: Both;10 reps Quad Sets: Left;10 reps Goniometric ROM: 0-30 degrees AAROM in L Knee.  0-20 degrees Active ROM in L knee. Pain at end range of motion.     PT Diagnosis: Difficulty walking;Acute pain;Generalized weakness  PT Problem List: Decreased range of motion;Decreased strength;Decreased mobility;Pain PT Treatment Interventions: Gait training;Stair training;DME instruction;Functional mobility training;Therapeutic exercise;Therapeutic activities;Patient/family education   PT Goals Acute Rehab PT Goals PT Goal Formulation: With patient Time For Goal Achievement: 01/11/13 Potential to Achieve Goals: Good Pt will go Supine/Side to Sit: with modified independence PT Goal: Supine/Side to Sit - Progress: Goal set today Pt will go Sit to Supine/Side: with modified independence PT Goal: Sit to Supine/Side - Progress: Goal set today Pt will go Sit to Stand: with modified independence PT Goal: Sit to Stand - Progress: Goal set today Pt will go Stand to Sit: with modified independence PT Goal: Stand to Sit - Progress: Goal set today Pt will Ambulate: >150 feet;with modified independence;with crutches PT Goal: Ambulate - Progress: Goal set today Pt will Go Up / Down Stairs: Flight;with modified independence;with crutches PT Goal: Up/Down Stairs - Progress: Goal set today Pt will Perform Home Exercise Program: Independently PT Goal: Perform Home Exercise Program - Progress: Goal set today  Visit Information  Last PT Received On: 01/04/13    Subjective Data  Subjective: Agree to PT eval Patient Stated Goal: Walk without pain   Prior Functioning  Home Living Lives With: Spouse;Son;Daughter Available Help at Discharge: Available PRN/intermittently Type of Home: House Home Access: Stairs to enter Entrance  Stairs-Number of Steps: 1 Entrance Stairs-Rails: None Home Layout: Two level Alternate Level Stairs-Number of Steps: 16 Alternate Level Stairs-Rails:  Right Bathroom Shower/Tub: Walk-in shower;Door [employer] Toilet: Pharmacist, community: Yes How Accessible: Accessible via walker Home Adaptive Equipment: Crutches Prior Function Level of Independence: Independent Able to Take Stairs?: Yes Driving: Yes Vocation: Full time employment Communication Communication: No difficulties    Cognition  Overall Cognitive Status: Appears within functional limits for tasks assessed/performed Area of Impairment: Attention Arousal/Alertness: Awake/alert Orientation Level: Appears intact for tasks assessed Behavior During Session: [facility] for tasks performed    Extremity/Trunk Assessment Right Upper Extremity Assessment RUE ROM/Strength/Tone: Within functional levels Left Upper Extremity Assessment LUE ROM/Strength/Tone: Within functional levels Right Lower Extremity Assessment RLE ROM/Strength/Tone: Within functional levels Left Lower Extremity Assessment LLE ROM/Strength/Tone: Deficits LLE ROM/Strength/Tone Deficits: ROM and strength limited by pain and edema.   Trunk Assessment Trunk Assessment: Normal   Balance Balance Balance Assessed: No  End of Session PT - End of Session Equipment Utilized During Treatment: Gait belt Activity Tolerance: Patient tolerated treatment well Patient left: in chair;with call bell/phone within reach Nurse Communication: Mobility status  GP     Valerya Maxton 01/04/2013, 11:56 AM  Theron Arista L. Kelley Polinsky DPT [phone]

## 2013-01-04 NOTE — Progress Notes (Signed)
INFECTIOUS DISEASE PROGRESS NOTE  ID: Spencer English is a 47 y.o. male with   Active Problems:  DM  DYSLIPIDEMIA  GOUT  HYPERTENSION  PULMONARY EMBOLISM  DEEP VENOUS THROMBOPHLEBITIS, LEG, LEFT  Atrial fibrillation  Subjective: Without complaints  Abtx:  Anti-infectives     Start     Dose/Rate Route Frequency Ordered Stop   01/04/13 0600   vancomycin (VANCOCIN) IVPB 1000 mg/200 mL premix        1,000 mg 200 mL/hr over 60 Minutes Intravenous Every 12 hours 01/03/13 1727     01/04/13 0530   cefTRIAXone (ROCEPHIN) 1 g in dextrose 5 % 50 mL IVPB        1 g 100 mL/hr over 30 Minutes Intravenous Every 24 hours 01/03/13 1646     01/03/13 1800   vancomycin (VANCOCIN) IVPB 1000 mg/200 mL premix  Status:  Discontinued        1,000 mg 200 mL/hr over 60 Minutes Intravenous Every 12 hours 01/03/13 1711 01/03/13 1727   01/03/13 1800   vancomycin (VANCOCIN) 2,000 mg in sodium chloride 0.9 % 500 mL IVPB        2,000 mg 250 mL/hr over 120 Minutes Intravenous  Once 01/03/13 1727 01/03/13 2036          Medications:  Scheduled:   . atorvastatin  20 mg Oral q1800  . cefTRIAXone (ROCEPHIN)  IV  1 g Intravenous Q24H  . docusate sodium  100 mg Oral BID  . enoxaparin (LOVENOX) injection  120 mg Subcutaneous BID  . furosemide  40 mg Oral Daily  . insulin aspart  0-15 Units Subcutaneous TID WC  . insulin aspart  0-5 Units Subcutaneous QHS  . insulin aspart  4 Units Subcutaneous TID WC  . irbesartan  300 mg Oral Daily  . metoprolol tartrate  25 mg Oral BID  . multivitamin with minerals  1 tablet Oral Daily  . mupirocin ointment   Nasal BID  . vancomycin  1,000 mg Intravenous Q12H  . warfarin  10 mg Oral ONCE-1800  . Warfarin - Pharmacist Dosing Inpatient   Does not apply q1800    Objective: Vital signs in last 24 hours: Temp:  [98.7 F (37.1 C)-99.8 F (37.7 C)] 99.1 F (37.3 C) (01/07 0545) Pulse Rate:  [100-124] 108  (01/07 0545) Resp:  [15-22] 20  (01/07 0545) BP:  (121-160)/(64-97) 129/64 mmHg (01/07 0545) SpO2:  [96 %-100 %] 98 % (01/07 0545)   General appearance: alert, cooperative and no distress Incision/Wound: L knee dressed.   Lab Results  Basename 01/04/13 0630 01/03/13 0216  WBC 7.0 7.2  HGB 11.2* 12.6*  HCT 34.6* 39.1  NA 136 137  K 4.2 4.0  CL 101 101  CO2 25 25  BUN 9 13  CREATININE 0.72 0.70  GLU -- --   Liver Panel No results found for this basename: PROT:2,ALBUMIN:2,AST:2,ALT:2,ALKPHOS:2,BILITOT:2,BILIDIR:2,IBILI:2 in the last 72 hours Sedimentation Rate No results found for this basename: ESRSEDRATE in the last 72 hours C-Reactive Protein No results found for this basename: CRP:2 in the last 72 hours  Microbiology: Recent Results (from the past 240 hour(s))  CULTURE, BLOOD (SINGLE)     Status: Normal (Preliminary result)   Collection Time   01/03/13  2:00 AM      Component Value Range Status Comment   Specimen Description BLOOD RIGHT FOREARM   Final    Special Requests BOTTLES DRAWN AEROBIC AND ANAEROBIC [facility] EACH   Final    Culture  Setup Time 01/03/2013 18:09   Final    Culture     Final    Value:        BLOOD CULTURE RECEIVED NO GROWTH TO DATE CULTURE WILL BE HELD FOR 5 DAYS BEFORE ISSUING A FINAL NEGATIVE REPORT   Report Status PENDING   Incomplete   SURGICAL PCR SCREEN     Status: Abnormal   Collection Time   01/03/13 12:36 PM      Component Value Range Status Comment   MRSA, PCR NEGATIVE  NEGATIVE Final    Staphylococcus aureus POSITIVE (*) NEGATIVE Final   BODY FLUID CULTURE     Status: Normal (Preliminary result)   Collection Time   01/03/13  2:32 PM      Component Value Range Status Comment   Specimen Description FLUID SYNOVIAL LEFT KNEE   Final    Special Requests PT ON VANCO   Final    Gram Stain     Final    Value: MODERATE WBC PRESENT,BOTH PMN AND MONONUCLEAR     NO ORGANISMS SEEN     Performed at [facility]   Culture PENDING   Incomplete    Report Status PENDING   Incomplete   ANAEROBIC  CULTURE     Status: Normal (Preliminary result)   Collection Time   01/03/13  2:32 PM      Component Value Range Status Comment   Specimen Description FLUID SYNOVIAL LEFT KNEE   Final    Special Requests PT ON VANCO FLUID ON SWAB   Final    Gram Stain     Final    Value: MODERATE WBC PRESENT,BOTH PMN AND MONONUCLEAR     NO ORGANISMS SEEN     Performed at [facility]   Culture PENDING   Incomplete    Report Status PENDING   Incomplete   GRAM STAIN     Status: Normal   Collection Time   01/03/13  2:32 PM      Component Value Range Status Comment   Specimen Description FLUID SYNOVIAL LEFT KNEE   Final    Special Requests PT ON VANCO FLUID ON SWAB   Final    Gram Stain     Final    Value: MODERATE WBC PRESENT,BOTH PMN AND MONONUCLEAR     NO ORGANISMS SEEN   Report Status 01/03/2013 FINAL   Final     Studies/Results: Dg Chest Portable 1 View  01/03/2013  *RADIOLOGY REPORT*  Clinical Data: 47 year old male preoperative study.  History of pulmonary embolus in 2010.  PORTABLE CHEST - 1 VIEW  Comparison: 09/19/2012 chest CTA and earlier.  Findings: AP view at 1222 hours.  Mildly lower lung volumes. Cardiac size and mediastinal contours are within normal limits. Visualized tracheal air column is within normal limits.  No pneumothorax or pulmonary edema.  No pleural effusion.  Mildly increased retrocardiac opacity on the left.  IMPRESSION: Mildly increased retrocardiac opacity on the left.  Favor atelectasis, but upright lateral view would be valuable to exclude consolidation/pneumonia if possible.   Original Report Authenticated By: Erskine Speed, M.D.    Dg Knee Complete 4 Views Left  01/03/2013  *RADIOLOGY REPORT*  Clinical Data: Knee pain and swelling.  History of gout.  LEFT KNEE - COMPLETE 4+ VIEW  Comparison: None.  Findings: There is no fracture or dislocation.  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  Small joint effusion is noted. Loose bodies are identified in  the joint.  One of the largest measures 1.2 cm and is best seen on the lateral view.  IMPRESSION:  1.  No acute abnormality. 2.  Advance for age degenerative disease.   Original Report Authenticated By: Holley Dexter, M.D.      Assessment/Plan:  Septic Arthritis R knee  Will continue vanco and ceftriaxone (day 2) Await his Cx's His w/u for other etiologies is (-) so far.     eJeffrey Prentice Sackrider Infectious Diseases [phone] 01/04/2013, 11:43 AM   LOS: 1 day

## 2013-01-04 NOTE — Op Note (Signed)
NAME:  MAKANI, SECKMAN NO.:  [id number]  MEDICAL RECORD NO.:  [id number]  LOCATION:  [facility]06                          FACILITY:  [facility]  PHYSICIAN:  Dyke Brackett, M.D.    DATE OF BIRTH:  [birth year]  DATE OF PROCEDURE:  01/03/2013 DATE OF DISCHARGE:  01/03/2013                              OPERATIVE REPORT   INDICATIONS:  A 47 year old, presented to our office, and seeing Dr. Farris Has.  Aspirate at that point was performed.  Results of that aspirate were not available as that was done late Thursday.  That aspirate eventually was growing rare staph, sensitivities are unknown. Additionally, he had either calcium pyrophosphate or gout crystals in the knee with a white count of approximately 65,000 to 70,000.  He also had fever.  He had been in the ED in the [facility] branch of [place], early in the morning of January 03, 2013, at which time he demonstrated a fever with increasing pain.  After seeing him back in the office earlier today, it was elected that he probably had a concomitant pyarthrosis with gout or pseudogout, and this would require hospitalization lavage and IV antibiotics.  PREOPERATIVE DIAGNOSES: 1. 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 2. Grade 3 and 4 chondromalacia, lateral compartment patellofemoral     joint. 3. Loose body.  POSTOPERATIVE DIAGNOSIS: 1. 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 2. Grade 3 and 4 chondromalacia, lateral compartment patellofemoral     joint. 3. Loose body.  OPERATION: 1. Arthroscopic lavage. 2. Arthroscopic synovectomy. 3. Arthroscopic debridement chondroplasty and loose body removal.  All     from the involved left knee.  SURGEON:  Dyke Brackett, M.D.  ANESTHESIA:  General anesthetic with the tourniquet of approximately 45 minutes.  DESCRIPTION OF PROCEDURE:  Sterile prep and drape, the leg holder, no exsanguination.  Tourniquet was inflated to 350.  Tourniquet was used due to the patient's  having been on Coumadin with an INR greater than 2.5.  We encountered a significant amount of purulent material estimated probably 80-90 mL of gross pus.  For thoroughness, we sent this for repeat culture and Gram stain.  There was exudative process with extensive synovial reaction which was debrided.  There was grade 3 and some grade 4 changes on the trochlea.  The medial compartment looked relatively normal.  He looked like he had probably had a previous lateral meniscectomy, but he had grade 3 and grade 4 changes, particularly on the posterolateral plateau.  There was osteochondral loose body floating the anterolateral gutter which we removed and then performed copious synovectomy with lavage of approximately 18000 mL of fluid.  We put a Hemovac drain into the portals, tied them over nylon.  Closed the remaining third portal. Light compressive sterile dressing was applied.  Tourniquet was released after application of dressing.  Taken to recovery room in stable condition.     Dyke Brackett, M.D.     WDC/MEDQ  D:  01/03/2013  T:  01/04/2013  Job:  [id number]

## 2013-01-04 NOTE — Progress Notes (Signed)
Pt decline to dangle last night 01/03/13 stating  that he will in am.Geraline [facility] LPN

## 2013-01-05 LAB — GC/CHLAMYDIA PROBE AMP, URINE
Chlamydia, Swab/Urine, PCR: NEGATIVE
GC Probe Amp, Urine: NEGATIVE

## 2013-01-05 LAB — CBC
HCT: 34.7 % — ABNORMAL LOW (ref 39.0–52.0)
Hemoglobin: 11 g/dL — ABNORMAL LOW (ref 13.0–17.0)
MCH: 23.8 pg — ABNORMAL LOW (ref 26.0–34.0)
MCV: 74.9 fL — ABNORMAL LOW (ref 78.0–100.0)
RBC: 4.63 MIL/uL (ref 4.22–5.81)
WBC: 7.1 10*3/uL (ref 4.0–10.5)

## 2013-01-05 LAB — GLUCOSE, CAPILLARY
Glucose-Capillary: 213 mg/dL — ABNORMAL HIGH (ref 70–99)
Glucose-Capillary: 143 mg/dL — ABNORMAL HIGH (ref 70–99)
Glucose-Capillary: 126 mg/dL — ABNORMAL HIGH (ref 70–99)

## 2013-01-05 LAB — VANCOMYCIN, TROUGH: Vancomycin Tr: 5.1 ug/mL — ABNORMAL LOW (ref 10.0–20.0)

## 2013-01-05 MED ORDER — WARFARIN SODIUM 10 MG PO TABS
10.0000 mg | ORAL_TABLET | Freq: Once | ORAL | Status: AC
  Administered 2013-01-05: 10 mg via ORAL
  Filled 2013-01-05 (×2): qty 1

## 2013-01-05 MED ORDER — METOPROLOL TARTRATE 25 MG PO TABS
25.0000 mg | ORAL_TABLET | Freq: Once | ORAL | Status: AC
  Administered 2013-01-05: 25 mg via ORAL
  Filled 2013-01-05: qty 1

## 2013-01-05 MED ORDER — VANCOMYCIN HCL IN DEXTROSE 1-5 GM/200ML-% IV SOLN
1000.0000 mg | Freq: Three times a day (TID) | INTRAVENOUS | Status: DC
  Administered 2013-01-06 – 2013-01-07 (×5): 1000 mg via INTRAVENOUS
  Filled 2013-01-05 (×8): qty 200

## 2013-01-05 MED ORDER — METOPROLOL TARTRATE 50 MG PO TABS
50.0000 mg | ORAL_TABLET | Freq: Two times a day (BID) | ORAL | Status: DC
  Administered 2013-01-05: 50 mg via ORAL
  Filled 2013-01-05 (×3): qty 1

## 2013-01-05 NOTE — Progress Notes (Signed)
Orthopedic Tech Progress Note Patient Details:  [name] Dec 24, 1966 [id number]  Ortho Devices Type of Ortho Device: Crutches Ortho Device/Splint Location: crutches 6.0'' Ortho Device/Splint Interventions: Application   Cammer, Mickie Bail 01/05/2013, 11:21 AM

## 2013-01-05 NOTE — Progress Notes (Signed)
Physical Therapy Treatment Patient Details Name: Spencer English MRN: [id number] DOB: Oct 10, 1966 Today's Date: 01/05/2013 Time: 7829-5621 PT Time Calculation (min): 35 min  PT Assessment / Plan / Recommendation Comments on Treatment Session  Pt doing well with mobility and should be appropritate to d/c home this afternoon.     Follow Up Recommendations  Outpatient PT     Does the patient have the potential to tolerate intense rehabilitation     Barriers to Discharge        Equipment Recommendations  None recommended by PT    Recommendations for Other Services    Frequency Min 6X/week   Plan Discharge plan remains appropriate;Frequency remains appropriate    Precautions / Restrictions Precautions Precautions: None Restrictions Weight Bearing Restrictions: Yes LLE Weight Bearing: Weight bearing as tolerated   Pertinent Vitals/Pain Pt c/o 10/10 pain in L Knee RN notified.      Mobility  Bed Mobility Bed Mobility: Supine to Sit;Sitting - Scoot to Edge of Bed Supine to Sit: 6: Modified independent (Device/Increase time) Sitting - Scoot to Edge of Bed: 5: Supervision Details for Bed Mobility Assistance: Instructed pt to use leg lifter to assist with L LE management.  Transfers Transfers: Sit to Stand;Stand to Sit Sit to Stand: 4: Min guard;5: Supervision;From bed;From chair/3-in-1;With upper extremity assist Stand to Sit: 4: Min guard;5: Supervision;With upper extremity assist;To chair/3-in-1 Details for Transfer Assistance: Instructed pt in proper technique with axillary crutches.  Ambulation/Gait Ambulation/Gait Assistance: 4: Min guard Ambulation Distance (Feet): 80 Feet Assistive device: Crutches Ambulation/Gait Assistance Details: Cues for sequencing with axillary crutches Gait Pattern: Step-to pattern Stairs: Yes Stairs Assistance: 5: Supervision Stairs Assistance Details (indicate cue type and reason): instructed pt in step to technique with 1 axillary crutch and R  ascending rail.  Stair Management Technique: One rail Right;With crutches Wheelchair Mobility Wheelchair Mobility: No    Exercises Total Joint Exercises Goniometric ROM: 0-70 degrees of AAROM    PT Diagnosis:    PT Problem List:   PT Treatment Interventions:     PT Goals Acute Rehab PT Goals PT Goal Formulation: With patient Time For Goal Achievement: 01/11/13 Potential to Achieve Goals: Good Pt will go Supine/Side to Sit: with modified independence PT Goal: Supine/Side to Sit - Progress: Progressing toward goal Pt will go Sit to Supine/Side: with modified independence PT Goal: Sit to Supine/Side - Progress: Progressing toward goal Pt will go Sit to Stand: with modified independence PT Goal: Sit to Stand - Progress: Progressing toward goal Pt will go Stand to Sit: Independently PT Goal: Stand to Sit - Progress: Progressing toward goal Pt will Ambulate: >150 feet;with modified independence;with crutches PT Goal: Ambulate - Progress: Progressing toward goal Pt will Go Up / Down Stairs: Flight;with modified independence;with crutches PT Goal: Up/Down Stairs - Progress: Met Pt will Perform Home Exercise Program: Independently  Visit Information  Last PT Received On: 01/05/13    Subjective Data  Subjective: I don't think I can go home.  I have been having a fever in the leg.   Patient Stated Goal: Walk without pain   Cognition  Overall Cognitive Status: Appears within functional limits for tasks assessed/performed Arousal/Alertness: Awake/alert Orientation Level: Appears intact for tasks assessed Behavior During Session: [facility] for tasks performed    Balance  Balance Balance Assessed: No  End of Session PT - End of Session Equipment Utilized During Treatment: Gait belt Activity Tolerance: Patient tolerated treatment well Patient left: in chair;with call bell/phone within reach Nurse Communication: Mobility status  GP     Alferd Apa 01/05/2013, 3:08 PM

## 2013-01-05 NOTE — Consult Note (Signed)
[facility] CONSULT FOLLOW-UP NOTE  Spencer English XWR:[id number] DOB: 11-Mar-1966 DOA: 01/03/2013 PCP: Romero Belling, MD Cardiologist: Peter Swazil[place], M.D. Requesting physician: Frederico Hamman, MD Attending service: Orthopedics Reason for consultation: Assistance with diabetes, atrial fibrillation.  Impression/Recommendations:  1. Septic left knee: Deferred to orthopedics and infectious disease. 2. Paroxysmal atrial fibrillation/flutter: Asymptomatic, variable rate. Increase beta blocker. Has followup with electrophysiology as an outpatient. Anticoagulation as below. 3. Diabetes mellitus type 2: Stable. Sliding scale insulin. Resume January discharge. 4. Hypertension: Well controlled. 5. History of gout: Continue allopurinol. 6. History of DVT and pulmonary embolism: Continue chronic anticoagulation therapy as per pharmacy, long-term warfarin.  Code Status: Full code Family Communication: None present Disposition Plan: Per orthopedics  Brendia Sacks, MD  [facility] Team 4  Pager [phone] If 7PM-7AM, please contact night-coverage at [email or web address], password [facility] 01/05/2013, 3:52 PM  LOS: 2 days   Brief narrative: 47 year old man admitted for septic arthritis in the knee.  Other consultants:  Infectious disease  Procedures: Arthroscopic lavage.  2. Arthroscopic synovectomy.  3. Arthroscopic debridement chondroplasty and loose body removal. All  from the involved left knee.  HPI/Subjective: Has pain when moves, but pain controlled when he is still.  Objective: Filed Vitals:   01/04/13 2007 01/04/13 2220 01/05/13 0634 01/05/13 1401  BP:  105/62 141/55 112/75  Pulse:  68 109 135  Temp: 100.2 F (37.9 C) 98.9 F (37.2 C) 99.6 F (37.6 C) 98.9 F (37.2 C)  TempSrc: Oral Oral Oral   Resp:  18 19 18   Height:      Weight:      SpO2:  99% 98% 100%    Intake/Output Summary (Last 24 hours) at 01/05/13 1552 Last data filed at 01/05/13 1527  Gross per 24 hour    Intake      0 ml  Output   2050 ml  Net  -2050 ml    Exam:  General:  Appears calm and comfortable Cardiovascular: Tachycardic, regular rhythm, no m/r/g. No LE edema. Respiratory: CTA bilaterally, no w/r/r. Normal respiratory effort. Psychiatric: grossly normal mood and affect, speech fluent and appropriate  Data Reviewed: Basic Metabolic Panel:  Lab 01/04/13 9562 01/03/13 0216  NA 136 137  K 4.2 4.0  CL 101 101  CO2 25 25  GLUCOSE 155* 171*  BUN 9 13  CREATININE 0.72 0.70  CALCIUM 8.8 9.3  MG -- --  PHOS -- --   CBC:  Lab 01/05/13 0510 01/04/13 0630 01/03/13 0216  WBC 7.1 7.0 7.2  NEUTROABS -- -- 5.2  HGB 11.0* 11.2* 12.6*  HCT 34.7* 34.6* 39.1  MCV 74.9* 74.9* 74.8*  PLT 194 188 224   CBG:  Lab 01/05/13 1110 01/05/13 0709 01/04/13 2133 01/04/13 1630 01/04/13 1219  GLUCAP 143* 213* 195* 163* 149*    Recent Results (from the past 240 hour(s))  CULTURE, BLOOD (SINGLE)     Status: Normal (Preliminary result)   Collection Time   01/03/13  2:00 AM      Component Value Range Status Comment   Specimen Description BLOOD RIGHT FOREARM   Final    Special Requests BOTTLES DRAWN AEROBIC AND ANAEROBIC 5CC EACH   Final    Culture  Setup Time 01/03/2013 18:09   Final    Culture     Final    Value:        BLOOD CULTURE RECEIVED NO GROWTH TO DATE CULTURE WILL BE HELD FOR 5 DAYS BEFORE ISSUING A FINAL NEGATIVE REPORT   Report Status  PENDING   Incomplete   SURGICAL PCR SCREEN     Status: Abnormal   Collection Time   01/03/13 12:36 PM      Component Value Range Status Comment   MRSA, PCR NEGATIVE  NEGATIVE Final    Staphylococcus aureus POSITIVE (*) NEGATIVE Final   BODY FLUID CULTURE     Status: Normal (Preliminary result)   Collection Time   01/03/13  2:32 PM      Component Value Range Status Comment   Specimen Description FLUID SYNOVIAL LEFT KNEE   Final    Special Requests PT ON VANCO   Final    Gram Stain     Final    Value: MODERATE WBC PRESENT,BOTH PMN AND  MONONUCLEAR     NO ORGANISMS SEEN     Performed at [facility]   Culture NO GROWTH 2 DAYS   Final    Report Status PENDING   Incomplete   ANAEROBIC CULTURE     Status: Normal (Preliminary result)   Collection Time   01/03/13  2:32 PM      Component Value Range Status Comment   Specimen Description FLUID SYNOVIAL LEFT KNEE   Final    Special Requests PT ON VANCO FLUID ON SWAB   Final    Gram Stain     Final    Value: MODERATE WBC PRESENT,BOTH PMN AND MONONUCLEAR     NO ORGANISMS SEEN     Performed at [facility]   Culture     Final    Value: NO ANAEROBES ISOLATED; CULTURE IN PROGRESS FOR 5 DAYS   Report Status PENDING   Incomplete   GRAM STAIN     Status: Normal   Collection Time   01/03/13  2:32 PM      Component Value Range Status Comment   Specimen Description FLUID SYNOVIAL LEFT KNEE   Final    Special Requests PT ON VANCO FLUID ON SWAB   Final    Gram Stain     Final    Value: MODERATE WBC PRESENT,BOTH PMN AND MONONUCLEAR     NO ORGANISMS SEEN   Report Status 01/03/2013 FINAL   Final      Studies:   Scheduled Meds:   . atorvastatin  20 mg Oral q1800  . cefTRIAXone (ROCEPHIN)  IV  1 g Intravenous Q24H  . docusate sodium  100 mg Oral BID  . enoxaparin (LOVENOX) injection  120 mg Subcutaneous BID  . furosemide  40 mg Oral Daily  . insulin aspart  0-15 Units Subcutaneous TID WC  . insulin aspart  0-5 Units Subcutaneous QHS  . insulin aspart  4 Units Subcutaneous TID WC  . irbesartan  300 mg Oral Daily  . metoprolol tartrate  25 mg Oral BID  . multivitamin with minerals  1 tablet Oral Daily  . mupirocin ointment   Nasal BID  . vancomycin  1,000 mg Intravenous Q12H  . warfarin  10 mg Oral ONCE-1800  . Warfarin - Pharmacist Dosing Inpatient   Does not apply q1800   Continuous Infusions:   . sodium chloride 75 mL/hr at 01/03/13 1625     Active Problems:  DM  DYSLIPIDEMIA  GOUT  HYPERTENSION  PULMONARY EMBOLISM  DEEP VENOUS THROMBOPHLEBITIS, LEG,  LEFT  Atrial fibrillation    Time Spent: 20 minutes  Brendia Sacks, MD  [facility] Team 4  Pager [phone] If 7PM-7AM, please contact night-coverage at [email or web address], password [facility] 01/05/2013, 3:52 PM  LOS: 2  days

## 2013-01-05 NOTE — Progress Notes (Signed)
ANTICOAGULATION CONSULT NOTE - Follow Up Consult  Pharmacy Consult for Vancomycin Indication:  septic joint  Allergies  Allergen Reactions  . Metformin     REACTION: nausea/diarrhea    Patient Measurements: Height: 5\' 10"  (177.8 cm) Weight: 253 lb (114.76 kg) IBW/kg (Calculated) : 73   Vital Signs: Temp: 98.9 F (37.2 C) (01/08 1401) BP: 112/75 mmHg (01/08 1401) Pulse Rate: 135  (01/08 1401)  Labs:  Basename 01/05/13 0510 01/04/13 0630 01/03/13 0216  HGB 11.0* 11.2* --  HCT 34.7* 34.6* 39.1  PLT 194 188 224  APTT -- -- --  LABPROT 18.3* 20.4* 26.7*  INR 1.57* 1.82* 2.62*  HEPARINUNFRC -- -- --  CREATININE -- 0.72 0.70  CKTOTAL -- -- --  CKMB -- -- --  TROPONINI -- -- --    Estimated Creatinine Clearance: 144.8 ml/min (by C-G formula based on Cr of 0.72).   Medications:  Scheduled:     . atorvastatin  20 mg Oral q1800  . cefTRIAXone (ROCEPHIN)  IV  1 g Intravenous Q24H  . docusate sodium  100 mg Oral BID  . enoxaparin (LOVENOX) injection  120 mg Subcutaneous BID  . furosemide  40 mg Oral Daily  . insulin aspart  0-15 Units Subcutaneous TID WC  . insulin aspart  0-5 Units Subcutaneous QHS  . insulin aspart  4 Units Subcutaneous TID WC  . irbesartan  300 mg Oral Daily  . metoprolol tartrate  50 mg Oral BID  . [COMPLETED] metoprolol tartrate  25 mg Oral Once  . multivitamin with minerals  1 tablet Oral Daily  . mupirocin ointment   Nasal BID  . vancomycin  1,000 mg Intravenous Q12H  . [COMPLETED] warfarin  10 mg Oral ONCE-1800  . Warfarin - Pharmacist Dosing Inpatient   Does not apply q1800  . [DISCONTINUED] metoprolol tartrate  25 mg Oral BID   Vanc 1/6>> Rocephin 1/6>>  1/3 Aspirate (from office) >> MRSA per MD notes 1/6 Blood cx 1/6 Synovial Fluid   Assessment: 47 yo M admitted with septic arthritis s/p I&D washout 01/03/13.  Cx pending.  Vanc trough level is below goal at 5.1.     Goal of Therapy:  Vancomycin trough 15-20 mcg/ml   Plan:    Increase Vancomycin to 1000mg  IV q8hrs.   F/u renal func and vanc trough level at steady state.   Wendie Simmer, PharmD, BCPS Clinical Pharmacist  Pager: [phone]

## 2013-01-05 NOTE — Progress Notes (Signed)
INFECTIOUS DISEASE PROGRESS NOTE  ID: Spencer English is a 47 y.o. male with  Active Problems:  DM  DYSLIPIDEMIA  GOUT  HYPERTENSION  PULMONARY EMBOLISM  DEEP VENOUS THROMBOPHLEBITIS, LEG, LEFT  Atrial fibrillation  Subjective: Without complaints. Has been up with PT/OT  Abtx:  Anti-infectives     Start     Dose/Rate Route Frequency Ordered Stop   01/04/13 0600   vancomycin (VANCOCIN) IVPB 1000 mg/200 mL premix        1,000 mg 200 mL/hr over 60 Minutes Intravenous Every 12 hours 01/03/13 1727     01/04/13 0530   cefTRIAXone (ROCEPHIN) 1 g in dextrose 5 % 50 mL IVPB        1 g 100 mL/hr over 30 Minutes Intravenous Every 24 hours 01/03/13 1646     01/03/13 1800   vancomycin (VANCOCIN) IVPB 1000 mg/200 mL premix  Status:  Discontinued        1,000 mg 200 mL/hr over 60 Minutes Intravenous Every 12 hours 01/03/13 1711 01/03/13 1727   01/03/13 1800   vancomycin (VANCOCIN) 2,000 mg in sodium chloride 0.9 % 500 mL IVPB        2,000 mg 250 mL/hr over 120 Minutes Intravenous  Once 01/03/13 1727 01/03/13 2036          Medications:  Scheduled:   . atorvastatin  20 mg Oral q1800  . cefTRIAXone (ROCEPHIN)  IV  1 g Intravenous Q24H  . docusate sodium  100 mg Oral BID  . enoxaparin (LOVENOX) injection  120 mg Subcutaneous BID  . furosemide  40 mg Oral Daily  . insulin aspart  0-15 Units Subcutaneous TID WC  . insulin aspart  0-5 Units Subcutaneous QHS  . insulin aspart  4 Units Subcutaneous TID WC  . irbesartan  300 mg Oral Daily  . metoprolol tartrate  25 mg Oral BID  . multivitamin with minerals  1 tablet Oral Daily  . mupirocin ointment   Nasal BID  . vancomycin  1,000 mg Intravenous Q12H  . warfarin  10 mg Oral ONCE-1800  . Warfarin - Pharmacist Dosing Inpatient   Does not apply q1800    Objective: Vital signs in last 24 hours: Temp:  [98.9 F (37.2 C)-100.2 F (37.9 C)] 98.9 F (37.2 C) (01/08 1401) Pulse Rate:  [68-135] 135  (01/08 1401) Resp:  [18-19] 18   (01/08 1401) BP: (105-141)/(55-75) 112/75 mmHg (01/08 1401) SpO2:  [98 %-100 %] 100 % (01/08 1401)   General appearance: alert, cooperative and no distress Extremities: L knee wrapped.   Lab Results  Basename 01/05/13 0510 01/04/13 0630 01/03/13 0216  WBC 7.1 7.0 --  HGB 11.0* 11.2* --  HCT 34.7* 34.6* --  NA -- 136 137  K -- 4.2 4.0  CL -- 101 101  CO2 -- 25 25  BUN -- 9 13  CREATININE -- 0.72 0.70  GLU -- -- --   Liver Panel No results found for this basename: PROT:2,ALBUMIN:2,AST:2,ALT:2,ALKPHOS:2,BILITOT:2,BILIDIR:2,IBILI:2 in the last 72 hours Sedimentation Rate No results found for this basename: ESRSEDRATE in the last 72 hours C-Reactive Protein No results found for this basename: CRP:2 in the last 72 hours  Microbiology: Recent Results (from the past 240 hour(s))  CULTURE, BLOOD (SINGLE)     Status: Normal (Preliminary result)   Collection Time   01/03/13  2:00 AM      Component Value Range Status Comment   Specimen Description BLOOD RIGHT FOREARM   Final    Special  Requests BOTTLES DRAWN AEROBIC AND ANAEROBIC 5CC EACH   Final    Culture  Setup Time 01/03/2013 18:09   Final    Culture     Final    Value:        BLOOD CULTURE RECEIVED NO GROWTH TO DATE CULTURE WILL BE HELD FOR 5 DAYS BEFORE ISSUING A FINAL NEGATIVE REPORT   Report Status PENDING   Incomplete   SURGICAL PCR SCREEN     Status: Abnormal   Collection Time   01/03/13 12:36 PM      Component Value Range Status Comment   MRSA, PCR NEGATIVE  NEGATIVE Final    Staphylococcus aureus POSITIVE (*) NEGATIVE Final   BODY FLUID CULTURE     Status: Normal (Preliminary result)   Collection Time   01/03/13  2:32 PM      Component Value Range Status Comment   Specimen Description FLUID SYNOVIAL LEFT KNEE   Final    Special Requests PT ON VANCO   Final    Gram Stain     Final    Value: MODERATE WBC PRESENT,BOTH PMN AND MONONUCLEAR     NO ORGANISMS SEEN     Performed at [facility]   Culture NO GROWTH 2  DAYS   Final    Report Status PENDING   Incomplete   ANAEROBIC CULTURE     Status: Normal (Preliminary result)   Collection Time   01/03/13  2:32 PM      Component Value Range Status Comment   Specimen Description FLUID SYNOVIAL LEFT KNEE   Final    Special Requests PT ON VANCO FLUID ON SWAB   Final    Gram Stain     Final    Value: MODERATE WBC PRESENT,BOTH PMN AND MONONUCLEAR     NO ORGANISMS SEEN     Performed at [facility]   Culture     Final    Value: NO ANAEROBES ISOLATED; CULTURE IN PROGRESS FOR 5 DAYS   Report Status PENDING   Incomplete   GRAM STAIN     Status: Normal   Collection Time   01/03/13  2:32 PM      Component Value Range Status Comment   Specimen Description FLUID SYNOVIAL LEFT KNEE   Final    Special Requests PT ON VANCO FLUID ON SWAB   Final    Gram Stain     Final    Value: MODERATE WBC PRESENT,BOTH PMN AND MONONUCLEAR     NO ORGANISMS SEEN   Report Status 01/03/2013 FINAL   Final     Studies/Results: No results found.   Assessment/Plan: Septic R knee Cx from 12-31-12 is (-). Was originally S. Aureus then corrected to read negative.  His Cx from the OR are ngtd.  Total days of antibiotics: 3  Would  Await Cx (ok if sent home and Cx f/u as outpt) continue vanco/ceftriaxone.  Plan for 1 month anbx.  Glad to see in ID clinic as f/u          Johny Sax Infectious Diseases [phone] 01/05/2013, 3:36 PM   LOS: 2 days

## 2013-01-05 NOTE — Progress Notes (Signed)
Physical Therapy Treatment Patient Details Name: Spencer English MRN: [id number] DOB: 1966/02/05 Today's Date: 01/05/2013 Time: 3244-0102 PT Time Calculation (min): 24 min  PT Assessment / Plan / Recommendation Comments on Treatment Session  Pt doing well with mobility and should be appropritate to d/c home this afternoon.     Follow Up Recommendations  Outpatient PT     Does the patient have the potential to tolerate intense rehabilitation     Barriers to Discharge        Equipment Recommendations  None recommended by PT    Recommendations for Other Services    Frequency Min 6X/week   Plan Discharge plan remains appropriate;Frequency remains appropriate    Precautions / Restrictions Precautions Precautions: None Restrictions Weight Bearing Restrictions: Yes LLE Weight Bearing: Weight bearing as tolerated   Pertinent Vitals/Pain 6/10 pain in L Knee, pt medicated prior to start of session.      Mobility  Bed Mobility Bed Mobility: Supine to Sit;Sitting - Scoot to Edge of Bed;Sit to Supine Supine to Sit: 6: Modified independent (Device/Increase time) Sitting - Scoot to Edge of Bed: 5: Supervision Details for Bed Mobility Assistance: Instructed pt to use leg lifter to assist with L LE management.  Transfers Transfers: Sit to Stand;Stand to Sit Sit to Stand: 6: Modified independent (Device/Increase time) Stand to Sit: 6: Modified independent (Device/Increase time) Details for Transfer Assistance: Instructed pt in proper technique with axillary crutches.  Ambulation/Gait Ambulation/Gait Assistance: Not tested (comment) Ambulation Distance (Feet): 80 Feet Assistive device: Crutches Ambulation/Gait Assistance Details: Cues for sequencing with axillary crutches Gait Pattern: Step-to pattern Stairs: Yes Stairs Assistance: 5: Supervision Stairs Assistance Details (indicate cue type and reason): instructed pt in step to technique with 1 axillary crutch and R ascending rail.    Stair Management Technique: One rail Right;With crutches Wheelchair Mobility Wheelchair Mobility: No    Exercises Total Joint Exercises Ankle Circles/Pumps: Both;10 reps Quad Sets: Left;10 reps Short Arc Quad: Left;20 reps;AROM Heel Slides: 20 reps;Left Goniometric ROM: 0-70 degrees of AAROM    PT Diagnosis:    PT Problem List:   PT Treatment Interventions:     PT Goals Acute Rehab PT Goals PT Goal Formulation: With patient Time For Goal Achievement: 01/11/13 Potential to Achieve Goals: Good Pt will go Supine/Side to Sit: with modified independence PT Goal: Supine/Side to Sit - Progress: Met Pt will go Sit to Supine/Side: with modified independence PT Goal: Sit to Supine/Side - Progress: Met Pt will go Sit to Stand: with modified independence PT Goal: Sit to Stand - Progress: Met Pt will go Stand to Sit: Independently PT Goal: Stand to Sit - Progress: Met Pt will Ambulate: >150 feet;with modified independence;with crutches PT Goal: Ambulate - Progress: Progressing toward goal Pt will Go Up / Down Stairs: Flight;with modified independence;with crutches PT Goal: Up/Down Stairs - Progress: Met Pt will Perform Home Exercise Program: Independently PT Goal: Perform Home Exercise Program - Progress: Progressing toward goal  Visit Information  Last PT Received On: 01/05/13    Subjective Data  Subjective: I don't think I can go home.  I have been having a fever in the leg.   Patient Stated Goal: Walk without pain   Cognition  Overall Cognitive Status: Appears within functional limits for tasks assessed/performed Arousal/Alertness: Awake/alert Orientation Level: Appears intact for tasks assessed Behavior During Session: [facility] for tasks performed    Balance  Balance Balance Assessed: No  End of Session PT - End of Session Equipment Utilized During Treatment: Gait  belt Activity Tolerance: Patient tolerated treatment well Patient left: in chair;with call bell/phone within  reach Nurse Communication: Mobility status   GP     Opal Mckellips 01/05/2013, 6:51 PM Tayra Dawe L. Greidys Deland DPT [phone]

## 2013-01-05 NOTE — Progress Notes (Signed)
Subjective: 2 Days Post-Op Procedure(s) (LRB): ARTHROSCOPY KNEE (Left) Patient reports pain as mild and moderate.   Minimal drain output last 24 hours  Objective: Vital signs in last 24 hours: Temp:  [98.9 F (37.2 C)-100.2 F (37.9 C)] 98.9 F (37.2 C) (01/08 1401) Pulse Rate:  [68-135] 135  (01/08 1401) Resp:  [18-19] 18  (01/08 1401) BP: (105-141)/(55-75) 112/75 mmHg (01/08 1401) SpO2:  [98 %-100 %] 100 % (01/08 1401)  Intake/Output from previous day: 01/07 0701 - 01/08 0700 In: -  Out: 2350 [Urine:2350] Intake/Output this shift:     Basename 01/05/13 0510 01/04/13 0630 01/03/13 0216  HGB 11.0* 11.2* 12.6*    Basename 01/05/13 0510 01/04/13 0630  WBC 7.1 7.0  RBC 4.63 4.62  HCT 34.7* 34.6*  PLT 194 188    Basename 01/04/13 0630 01/03/13 0216  NA 136 137  K 4.2 4.0  CL 101 101  CO2 25 25  BUN 9 13  CREATININE 0.72 0.70  GLUCOSE 155* 171*  CALCIUM 8.8 9.3    Basename 01/05/13 0510 01/04/13 0630  LABPT -- --  INR 1.57* 1.82*    Neurovascular intact Sensation intact distally Intact pulses distally Dorsiflexion/Plantar flexion intact Incision: moderate drainage No cellulitis present Compartment soft  Assessment/Plan: 2 Days Post-Op Procedure(s) (LRB): ARTHROSCOPY KNEE (Left) Hemovac drain d/c may change dressing once daily and prn Continue IV abx per ID recs, likely need PICC line for d/c home with plan to continue for 1 month Continue lovenox/coumadin until therapeutic level for afib Will need rec's for dosing for abx and anticoagulation for d/c rx Likely d/c home tomorrow   Margart Sickles 01/05/2013, 7:48 PM

## 2013-01-05 NOTE — Progress Notes (Signed)
ANTICOAGULATION CONSULT NOTE - Follow Up Consult  Pharmacy Consult for Lovenox and Coumadin; Vancomycin Indication: atrial fibrillation, history of pulmonary embolus and DVT; septic joint  Allergies  Allergen Reactions  . Metformin     REACTION: nausea/diarrhea    Patient Measurements: Height: 5\' 10"  (177.8 cm) Weight: 253 lb (114.76 kg) IBW/kg (Calculated) : 73   Vital Signs: Temp: 99.6 F (37.6 C) (01/08 0634) Temp src: Oral (01/08 0634) BP: 141/55 mmHg (01/08 0634) Pulse Rate: 109  (01/08 0634)  Labs:  Basename 01/05/13 0510 01/04/13 0630 01/03/13 0216  HGB 11.0* 11.2* --  HCT 34.7* 34.6* 39.1  PLT 194 188 224  APTT -- -- --  LABPROT 18.3* 20.4* 26.7*  INR 1.57* 1.82* 2.62*  HEPARINUNFRC -- -- --  CREATININE -- 0.72 0.70  CKTOTAL -- -- --  CKMB -- -- --  TROPONINI -- -- --    Estimated Creatinine Clearance: 144.8 ml/min (by C-G formula based on Cr of 0.72).   Medications:  Scheduled:    . atorvastatin  20 mg Oral q1800  . cefTRIAXone (ROCEPHIN)  IV  1 g Intravenous Q24H  . docusate sodium  100 mg Oral BID  . enoxaparin (LOVENOX) injection  120 mg Subcutaneous BID  . furosemide  40 mg Oral Daily  . insulin aspart  0-15 Units Subcutaneous TID WC  . insulin aspart  0-5 Units Subcutaneous QHS  . insulin aspart  4 Units Subcutaneous TID WC  . irbesartan  300 mg Oral Daily  . metoprolol tartrate  25 mg Oral BID  . multivitamin with minerals  1 tablet Oral Daily  . mupirocin ointment   Nasal BID  . vancomycin  1,000 mg Intravenous Q12H  . [COMPLETED] warfarin  10 mg Oral ONCE-1800  . Warfarin - Pharmacist Dosing Inpatient   Does not apply q1800   Vanc 1/6>> Rocephin 1/6>>  1/3 Aspirate (from office) >> MRSA per MD notes 1/6 Blood cx 1/6 Synovial Fluid   Assessment: 47 yo M admitted with septic arthritis s/p I&D washout 01/03/13.  Cx pending.  Continues on Coumadin for hx Afib and VTE.  INR remains subtherapeutic as a result of Coumadin held x 2 days  around time of knee procedure.  Continues on Lovenox bridging.  Goal of Therapy:  INR 2-3 Vancomycin trough 15-20 mcg/ml   Plan:  Continue Lovenox 120 mg SQ Q12h. Coumadin 10mg  PO x 1 tonight. Follow up with INR in AM. Check Vancomycin trough tonight prior to 1800 Vancomycin dose in anticipation of d/c home on IV antibiotics.  [employer], Pharm.D., BCPS Clinical Pharmacist Pager [phone] 01/05/2013 1:10 PM

## 2013-01-05 NOTE — Progress Notes (Signed)
CARE MANAGEMENT NOTE 01/05/2013  Patient:  Spencer English, Spencer English   Account Number:  [id number]  Date Initiated:  01/04/2013  Documentation initiated by:  Vance Peper  Subjective/Objective Assessment:   47 yr old male s/p arthrscope of left knee.     Action/Plan:   patient has no home health needs.   Anticipated DC Date:  01/05/2013   Anticipated DC Plan:  HOME/SELF CARE      DC Planning Services  CM consult      Choice offered to / List presented to:     DME arranged  [facility]           Status of service:  Completed, signed off Medicare Important Message given?   (If response is "NO", the following Medicare IM given date fields will be blank) Date Medicare IM given:   Date Additional Medicare IM given:    Discharge Disposition:  HOME/SELF CARE

## 2013-01-05 NOTE — Progress Notes (Signed)
OT Cancellation Note  Patient Details Name: Spencer English MRN: [id number] DOB: [birth year]   Cancelled Treatment:    Reason Eval/Treat Not Completed: Other (comment) (screen).  Pt has had several orthopedic surgeries and doesn't feel he needs OT.  He has a standard commode and walk in shower and doesn't anticipate a problem with either.    [address] OTR/L [phone] 01/05/2013 01/05/2013, 2:30 PM

## 2013-01-06 LAB — PROTIME-INR
INR: 2.68 — ABNORMAL HIGH (ref 0.00–1.49)
Prothrombin Time: 27.2 seconds — ABNORMAL HIGH (ref 11.6–15.2)

## 2013-01-06 LAB — CBC
Hemoglobin: 10.8 g/dL — ABNORMAL LOW (ref 13.0–17.0)
MCHC: 31.2 g/dL (ref 30.0–36.0)
RDW: 15.1 % (ref 11.5–15.5)
WBC: 6.2 10*3/uL (ref 4.0–10.5)

## 2013-01-06 MED ORDER — VANCOMYCIN HCL IN DEXTROSE 1-5 GM/200ML-% IV SOLN: 1000.0000 mg | Freq: Three times a day (TID) | INTRAVENOUS | Status: DC

## 2013-01-06 MED ORDER — VERAPAMIL HCL ER 240 MG PO TBCR
240.0000 mg | EXTENDED_RELEASE_TABLET | Freq: Every day | ORAL | Status: DC
  Administered 2013-01-06 – 2013-01-07 (×2): 240 mg via ORAL
  Filled 2013-01-06 (×2): qty 1

## 2013-01-06 MED ORDER — METOPROLOL TARTRATE 25 MG PO TABS: 25.0000 mg | ORAL_TABLET | Freq: Two times a day (BID) | ORAL | Status: DC

## 2013-01-06 MED ORDER — DEXTROSE 5 % IV SOLN: 1.0000 g | INTRAVENOUS | Status: DC

## 2013-01-06 MED ORDER — WARFARIN SODIUM 5 MG PO TABS
5.0000 mg | ORAL_TABLET | Freq: Once | ORAL | Status: AC
  Administered 2013-01-06: 5 mg via ORAL
  Filled 2013-01-06: qty 1

## 2013-01-06 MED ORDER — METOPROLOL TARTRATE 25 MG PO TABS
25.0000 mg | ORAL_TABLET | Freq: Two times a day (BID) | ORAL | Status: DC
Start: 2013-01-06 — End: 2013-01-08
  Administered 2013-01-06 – 2013-01-07 (×4): 25 mg via ORAL
  Filled 2013-01-06 (×4): qty 1

## 2013-01-06 MED ORDER — OXYCODONE-ACETAMINOPHEN 5-325 MG PO TABS: ORAL_TABLET | ORAL | Status: DC

## 2013-01-06 NOTE — Consult Note (Addendum)
[facility] CONSULT FOLLOW-UP NOTE  JAHAAN VANWAGNER ZOX:[id number] DOB: 09/21/1966 DOA: 01/03/2013 PCP: Romero Belling, MD Cardiologist: Peter Swaziland, M.D. Requesting physician: Frederico Hamman, MD Attending service: Orthopedics Reason for consultation: Assistance with diabetes, atrial fibrillation.  Impression/Recommendations:  1. Septic left knee: Deferred to orthopedics and infectious disease. 2. Paroxysmal atrial fibrillation/flutter: Asymptomatic, rate improved. Patient reports he has been on verapamil in addition to metoprolol as an outpatient. Will restart verapamil. Has followup with electrophysiology as an outpatient per outpatient cardiology note. Anticoagulation per pharmacy. 3. Diabetes mellitus type 2: Stable. Continue Sliding scale insulin. Resume Januvia on discharge. 4. Hypertension: Well controlled. 5. History of gout: Continue allopurinol. 6. History of DVT and pulmonary embolism: Continue chronic anticoagulation therapy as per pharmacy, long-term warfarin. INR therapeutic. He will need follow up with the Coumadin clinic at the end of the week.  Chronic medical issues appear stable at this time. Recommend discharge on outpatient dose of verapamil CR 240 mg (not reflected in medical reconciliation but a chronic medication), metoprolol 25 mg by mouth twice a day and resuming Januvia on discharge. Coumadin dosing per pharmacy. We will sign off. Please call if questions.  I discussed the above with Dr. Swaziland who concurred.  Code Status: Full code Family Communication: None present Disposition Plan: Per orthopedics  Brendia Sacks, MD  [facility] Team 4  Pager [phone] If 7PM-7AM, please contact night-coverage at [email or web address], password [facility] 01/06/2013, 8:27 AM  LOS: 3 days   Brief narrative: 47 year old man admitted for septic arthritis in the knee.  Other consultants:  Infectious disease  Procedures: Arthroscopic lavage.  2. Arthroscopic synovectomy.    3. Arthroscopic debridement chondroplasty and loose body removal. All  from the involved left knee.  HPI/Subjective: Afebrile, vital signs stable. Heart rate controlled. Overall pain controlled. No other complaints.  Objective: Filed Vitals:   01/05/13 2207 01/05/13 2354 01/06/13 0215 01/06/13 0616  BP:  116/66  121/67  Pulse: 122 96 64 95  Temp:    98.2 F (36.8 C)  TempSrc:  Oral  Oral  Resp:    18  Height:      Weight:      SpO2:  100% 99% 99%    Intake/Output Summary (Last 24 hours) at 01/06/13 0827 Last data filed at 01/06/13 0617  Gross per 24 hour  Intake      0 ml  Output   3500 ml  Net  -3500 ml    Exam:  General:  Appears calm and comfortable Cardiovascular: Irregular, normal rate, no m/r/g. No right LE edema. 1+ left lower extremity edema Respiratory: CTA bilaterally, no w/r/r. Normal respiratory effort. Psychiatric: grossly normal mood and affect, speech fluent and appropriate  Data Reviewed: Basic Metabolic Panel:  Lab 01/04/13 8295 01/03/13 0216  NA 136 137  K 4.2 4.0  CL 101 101  CO2 25 25  GLUCOSE 155* 171*  BUN 9 13  CREATININE 0.72 0.70  CALCIUM 8.8 9.3  MG -- --  PHOS -- --   CBC:  Lab 01/06/13 0530 01/05/13 0510 01/04/13 0630 01/03/13 0216  WBC 6.2 7.1 7.0 7.2  NEUTROABS -- -- -- 5.2  HGB 10.8* 11.0* 11.2* 12.6*  HCT 34.6* 34.7* 34.6* 39.1  MCV 76.0* 74.9* 74.9* 74.8*  PLT 200 194 188 224   CBG:  Lab 01/06/13 0702 01/05/13 2159 01/05/13 1631 01/05/13 1110 01/05/13 0709  GLUCAP 116* 126* 149* 143* 213*    Recent Results (from the past 240 hour(s))  CULTURE, BLOOD (SINGLE)  Status: Normal (Preliminary result)   Collection Time   01/03/13  2:00 AM      Component Value Range Status Comment   Specimen Description BLOOD RIGHT FOREARM   Final    Special Requests BOTTLES DRAWN AEROBIC AND ANAEROBIC 5CC EACH   Final    Culture  Setup Time 01/03/2013 18:09   Final    Culture     Final    Value:        BLOOD CULTURE RECEIVED NO  GROWTH TO DATE CULTURE WILL BE HELD FOR 5 DAYS BEFORE ISSUING A FINAL NEGATIVE REPORT   Report Status PENDING   Incomplete   SURGICAL PCR SCREEN     Status: Abnormal   Collection Time   01/03/13 12:36 PM      Component Value Range Status Comment   MRSA, PCR NEGATIVE  NEGATIVE Final    Staphylococcus aureus POSITIVE (*) NEGATIVE Final   BODY FLUID CULTURE     Status: Normal (Preliminary result)   Collection Time   01/03/13  2:32 PM      Component Value Range Status Comment   Specimen Description FLUID SYNOVIAL LEFT KNEE   Final    Special Requests PT ON VANCO   Final    Gram Stain     Final    Value: MODERATE WBC PRESENT,BOTH PMN AND MONONUCLEAR     NO ORGANISMS SEEN     Performed at [facility]   Culture NO GROWTH 2 DAYS   Final    Report Status PENDING   Incomplete   ANAEROBIC CULTURE     Status: Normal (Preliminary result)   Collection Time   01/03/13  2:32 PM      Component Value Range Status Comment   Specimen Description FLUID SYNOVIAL LEFT KNEE   Final    Special Requests PT ON VANCO FLUID ON SWAB   Final    Gram Stain     Final    Value: MODERATE WBC PRESENT,BOTH PMN AND MONONUCLEAR     NO ORGANISMS SEEN     Performed at [facility]   Culture     Final    Value: NO ANAEROBES ISOLATED; CULTURE IN PROGRESS FOR 5 DAYS   Report Status PENDING   Incomplete   GRAM STAIN     Status: Normal   Collection Time   01/03/13  2:32 PM      Component Value Range Status Comment   Specimen Description FLUID SYNOVIAL LEFT KNEE   Final    Special Requests PT ON VANCO FLUID ON SWAB   Final    Gram Stain     Final    Value: MODERATE WBC PRESENT,BOTH PMN AND MONONUCLEAR     NO ORGANISMS SEEN   Report Status 01/03/2013 FINAL   Final      Studies:   Scheduled Meds:    . atorvastatin  20 mg Oral q1800  . cefTRIAXone (ROCEPHIN)  IV  1 g Intravenous Q24H  . docusate sodium  100 mg Oral BID  . enoxaparin (LOVENOX) injection  120 mg Subcutaneous BID  . furosemide  40 mg Oral  Daily  . insulin aspart  0-15 Units Subcutaneous TID WC  . insulin aspart  0-5 Units Subcutaneous QHS  . insulin aspart  4 Units Subcutaneous TID WC  . irbesartan  300 mg Oral Daily  . metoprolol tartrate  50 mg Oral BID  . multivitamin with minerals  1 tablet Oral Daily  . mupirocin ointment  Nasal BID  . vancomycin  1,000 mg Intravenous Q8H  . Warfarin - Pharmacist Dosing Inpatient   Does not apply q1800   Continuous Infusions:    . sodium chloride 75 mL/hr at 01/05/13 2356     Active Problems:  DM  DYSLIPIDEMIA  GOUT  HYPERTENSION  PULMONARY EMBOLISM  DEEP VENOUS THROMBOPHLEBITIS, LEG, LEFT  Atrial fibrillation    Time Spent: 20 minutes  Brendia Sacks, MD  [facility] Team 4  Pager [phone] If 7PM-7AM, please contact night-coverage at [email or web address], password [facility] 01/06/2013, 8:27 AM  LOS: 3 days

## 2013-01-06 NOTE — Progress Notes (Signed)
INFECTIOUS DISEASE PROGRESS NOTE  ID: Spencer English is a 47 y.o. male with   Active Problems:  DM  DYSLIPIDEMIA  GOUT  HYPERTENSION  PULMONARY EMBOLISM  DEEP VENOUS THROMBOPHLEBITIS, LEG, LEFT  Atrial fibrillation  Subjective: Without complaints, ambulated with PT  Abtx:  Anti-infectives     Start     Dose/Rate Route Frequency Ordered Stop   01/06/13 0200   vancomycin (VANCOCIN) IVPB 1000 mg/200 mL premix        1,000 mg 200 mL/hr over 60 Minutes Intravenous Every 8 hours 01/05/13 1902     01/06/13 0000   dextrose 5 % SOLN 50 mL with cefTRIAXone 1 G SOLR 1 g     Comments: Pt needs 30 day supply, 1g ceftriaxone IVPB mixture  daily      1 g 100 mL/hr over 30 Minutes Intravenous Every 24 hours 01/06/13 1408     01/06/13 0000   vancomycin (VANCOCIN) 1 GM/200ML SOLN     Comments: rx may be decreased to bid once therapeutic to be managed by ID doctor      1,000 mg 200 mL/hr over 60 Minutes Intravenous Every 8 hours 01/06/13 1408     01/04/13 0600   vancomycin (VANCOCIN) IVPB 1000 mg/200 mL premix  Status:  Discontinued        1,000 mg 200 mL/hr over 60 Minutes Intravenous Every 12 hours 01/03/13 1727 01/06/13 0201   01/04/13 0530   cefTRIAXone (ROCEPHIN) 1 g in dextrose 5 % 50 mL IVPB        1 g 100 mL/hr over 30 Minutes Intravenous Every 24 hours 01/03/13 1646     01/03/13 1800   vancomycin (VANCOCIN) IVPB 1000 mg/200 mL premix  Status:  Discontinued        1,000 mg 200 mL/hr over 60 Minutes Intravenous Every 12 hours 01/03/13 1711 01/03/13 1727   01/03/13 1800   vancomycin (VANCOCIN) 2,000 mg in sodium chloride 0.9 % 500 mL IVPB        2,000 mg 250 mL/hr over 120 Minutes Intravenous  Once 01/03/13 1727 01/03/13 2036          Medications:  Scheduled:   . atorvastatin  20 mg Oral q1800  . cefTRIAXone (ROCEPHIN)  IV  1 g Intravenous Q24H  . docusate sodium  100 mg Oral BID  . furosemide  40 mg Oral Daily  . insulin aspart  0-15 Units Subcutaneous TID WC  .  insulin aspart  0-5 Units Subcutaneous QHS  . insulin aspart  4 Units Subcutaneous TID WC  . irbesartan  300 mg Oral Daily  . metoprolol tartrate  25 mg Oral BID  . multivitamin with minerals  1 tablet Oral Daily  . mupirocin ointment   Nasal BID  . vancomycin  1,000 mg Intravenous Q8H  . verapamil  240 mg Oral Daily  . warfarin  5 mg Oral ONCE-1800  . Warfarin - Pharmacist Dosing Inpatient   Does not apply q1800    Objective: Vital signs in last 24 hours: Temp:  [98 F (36.7 C)-98.4 F (36.9 C)] 98.4 F (36.9 C) (01/09 1355) Pulse Rate:  [64-155] 68  (01/09 1355) Resp:  [18-19] 18  (01/09 1355) BP: (108-143)/(57-67) 143/57 mmHg (01/09 1355) SpO2:  [99 %-100 %] 99 % (01/09 1355)   General appearance: alert, cooperative and no distress Extremities: edema 2+. LLE and L knee dressed.   Lab Results  Basename 01/06/13 0530 01/05/13 0510 01/04/13 0630  WBC 6.2 7.1 --  HGB 10.8* 11.0* --  HCT 34.6* 34.7* --  NA -- -- 136  K -- -- 4.2  CL -- -- 101  CO2 -- -- 25  BUN -- -- 9  CREATININE -- -- 0.72  GLU -- -- --   Liver Panel No results found for this basename: PROT:2,ALBUMIN:2,AST:2,ALT:2,ALKPHOS:2,BILITOT:2,BILIDIR:2,IBILI:2 in the last 72 hours Sedimentation Rate No results found for this basename: ESRSEDRATE in the last 72 hours C-Reactive Protein No results found for this basename: CRP:2 in the last 72 hours  Microbiology: Recent Results (from the past 240 hour(s))  CULTURE, BLOOD (SINGLE)     Status: Normal (Preliminary result)   Collection Time   01/03/13  2:00 AM      Component Value Range Status Comment   Specimen Description BLOOD RIGHT FOREARM   Final    Special Requests BOTTLES DRAWN AEROBIC AND ANAEROBIC 5CC EACH   Final    Culture  Setup Time 01/03/2013 18:09   Final    Culture     Final    Value:        BLOOD CULTURE RECEIVED NO GROWTH TO DATE CULTURE WILL BE HELD FOR 5 DAYS BEFORE ISSUING A FINAL NEGATIVE REPORT   Report Status PENDING   Incomplete     SURGICAL PCR SCREEN     Status: Abnormal   Collection Time   01/03/13 12:36 PM      Component Value Range Status Comment   MRSA, PCR NEGATIVE  NEGATIVE Final    Staphylococcus aureus POSITIVE (*) NEGATIVE Final   BODY FLUID CULTURE     Status: Normal (Preliminary result)   Collection Time   01/03/13  2:32 PM      Component Value Range Status Comment   Specimen Description FLUID SYNOVIAL LEFT KNEE   Final    Special Requests PT ON VANCO   Final    Gram Stain     Final    Value: MODERATE WBC PRESENT,BOTH PMN AND MONONUCLEAR     NO ORGANISMS SEEN     Performed at [facility]   Culture NO GROWTH 3 DAYS   Final    Report Status PENDING   Incomplete   ANAEROBIC CULTURE     Status: Normal (Preliminary result)   Collection Time   01/03/13  2:32 PM      Component Value Range Status Comment   Specimen Description FLUID SYNOVIAL LEFT KNEE   Final    Special Requests PT ON VANCO FLUID ON SWAB   Final    Gram Stain     Final    Value: MODERATE WBC PRESENT,BOTH PMN AND MONONUCLEAR     NO ORGANISMS SEEN     Performed at [facility]   Culture     Final    Value: NO ANAEROBES ISOLATED; CULTURE IN PROGRESS FOR 5 DAYS   Report Status PENDING   Incomplete   GRAM STAIN     Status: Normal   Collection Time   01/03/13  2:32 PM      Component Value Range Status Comment   Specimen Description FLUID SYNOVIAL LEFT KNEE   Final    Special Requests PT ON VANCO FLUID ON SWAB   Final    Gram Stain     Final    Value: MODERATE WBC PRESENT,BOTH PMN AND MONONUCLEAR     NO ORGANISMS SEEN   Report Status 01/03/2013 FINAL   Final     Studies/Results: No results found.   Assessment/Plan: Septic R knee  Cx from 12-31-12 is (-). Was originally S. Aureus then corrected to read negative.   Total days of antibiotics: 4  Would  Await Cx-  NGTD continue vanco/ceftriaxone.  Plan for 1 month anbx.  See him back in ID clinic in 3-4 weeks.   Johny Sax Infectious Diseases [phone] 01/06/2013,  3:34 PM   LOS: 3 days

## 2013-01-06 NOTE — Progress Notes (Signed)
Subjective: 3 Days Post-Op Procedure(s) (LRB): ARTHROSCOPY KNEE (Left) Patient reports pain as mild.    Objective: Vital signs in last 24 hours: Temp:  [98 F (36.7 C)-98.4 F (36.9 C)] 98.4 F (36.9 C) (01/09 1355) Pulse Rate:  [64-155] 68  (01/09 1355) Resp:  [18-19] 18  (01/09 1355) BP: (108-143)/(57-67) 143/57 mmHg (01/09 1355) SpO2:  [99 %-100 %] 99 % (01/09 1355)  Intake/Output from previous day: 01/08 0701 - 01/09 0700 In: -  Out: 3500 [Urine:3500] Intake/Output this shift: Total I/O In: 480 [P.O.:480] Out: 2000 [Urine:2000]   Basename 01/06/13 0530 01/05/13 0510 01/04/13 0630  HGB 10.8* 11.0* 11.2*    Basename 01/06/13 0530 01/05/13 0510  WBC 6.2 7.1  RBC 4.55 4.63  HCT 34.6* 34.7*  PLT 200 194    Basename 01/04/13 0630  NA 136  K 4.2  CL 101  CO2 25  BUN 9  CREATININE 0.72  GLUCOSE 155*  CALCIUM 8.8    Basename 01/06/13 0530 01/05/13 0510  LABPT -- --  INR 2.68* 1.57*    Neurovascular intact Sensation intact distally Intact pulses distally Dorsiflexion/Plantar flexion intact Incision: dressing C/D/I No cellulitis present Compartment soft  Assessment/Plan: 3 Days Post-Op Procedure(s) (LRB): ARTHROSCOPY KNEE (Left) Discharge home with home health Following PICC placement for home abx per ID recs 5mg  coumadin tonight then resume home dose tomorrow now therapeutic for afib Daily dressing change Pt may d/c home once PICC placed HH directed to obtain vanc trough tomorrow per pharmacy recs and may adjust dosage as necessary with Dr. Tyrone Apple office rx in chart for d/c  Margart Sickles 01/06/2013, 5:21 PM

## 2013-01-06 NOTE — Progress Notes (Signed)
CARE MANAGEMENT NOTE 01/06/2013  Patient:  Spencer English, Spencer English   Account Number:  [id number]  Date Initiated:  01/06/2013  Documentation initiated by:  Vance Peper  Subjective/Objective Assessment:   47 yr old male s/p left knee arthroscopic lavage and synovectomy.     Action/Plan:   Patient will go home on IV Vanc and ceftrioxone for 30 days. PICC Line to be placed.   Anticipated DC Date:  01/07/2013   Anticipated DC Plan:  HOME W HOME HEALTH SERVICES      DC Planning Services  CM consult      [facility] Choice  HOME HEALTH   Choice offered to / List presented to:  C-1 Patient        HH arranged  HH-1 RN      [facility] agency  [facility]   Status of service:  Completed, signed off Medicare Important Message given?   (If response is "NO", the following Medicare IM given date fields will be blank) Date Medicare IM given:   Date Additional Medicare IM given:    Discharge Disposition:  HOME W HOME HEALTH SERVICES  Per UR Regulation:    If discussed at Long Length of Stay Meetings, dates discussed:    Comments:

## 2013-01-06 NOTE — Progress Notes (Signed)
ANTICOAGULATION CONSULT NOTE - Follow Up Consult  Pharmacy Consult for Coumadin and Lovenox Indication: atrial fibrillation  Allergies  Allergen Reactions  . Metformin     REACTION: nausea/diarrhea    Patient Measurements: Height: 5\' 10"  (177.8 cm) Weight: 253 lb (114.76 kg) IBW/kg (Calculated) : 73  Heparin Dosing Weight:   Vital Signs: Temp: 98.2 F (36.8 C) (01/09 0616) Temp src: Oral (01/09 0616) BP: 121/67 mmHg (01/09 0616) Pulse Rate: 95  (01/09 0616)  Labs:  Basename 01/06/13 0530 01/05/13 0510 01/04/13 0630  HGB 10.8* 11.0* --  HCT 34.6* 34.7* 34.6*  PLT 200 194 188  APTT -- -- --  LABPROT 27.2* 18.3* 20.4*  INR 2.68* 1.57* 1.82*  HEPARINUNFRC -- -- --  CREATININE -- -- 0.72  CKTOTAL -- -- --  CKMB -- -- --  TROPONINI -- -- --    Estimated Creatinine Clearance: 144.8 ml/min (by C-G formula based on Cr of 0.72).   Medications:  Lovenox 120mg  SQ q12h  Assessment: 46yom on Lovenox bridging to Coumadin for hx Afib. INR (2.68) is now therapeutic after significantly increasing with Coumadin 10mg  x 2. Patient reports home Coumadin regimen as 7.5mg  daily - will decrease dose to 5mg  tonight and can hopefully resume home regimen tomorrow. Now with therapeutic INR and patient refusing Lovenox this AM - will decrease Lovenox bridge. - H/H slight trend down, Plts stable - No significant bleeding reported  Goal of Therapy:  INR 2-3   Plan:  1. Coumadin 5mg  po x 1 today 2. Discontinue Lovenox 3. Follow-up AM INR and discharge plans  Cleon Dew [phone] 01/06/2013,1:08 PM

## 2013-01-06 NOTE — Progress Notes (Signed)
Physical Therapy Treatment Patient Details Name: Spencer English MRN: [id number] DOB: February 15, 1966 Today's Date: 01/06/2013 Time: 4098-1191 PT Time Calculation (min): 13 min  PT Assessment / Plan / Recommendation Comments on Treatment Session  Patient agreeable to therex however ambulated this mroning and has been in the room. Patient had just gotten back from bathroom and sitting in recliner, deferred further ambulation    Follow Up Recommendations        Does the patient have the potential to tolerate intense rehabilitation     Barriers to Discharge        Equipment Recommendations  None recommended by PT    Recommendations for Other Services    Frequency Min 6X/week   Plan Discharge plan remains appropriate;Frequency remains appropriate    Precautions / Restrictions Precautions Precautions: None Restrictions LLE Weight Bearing: Weight bearing as tolerated   Pertinent Vitals/Pain     Mobility       Exercises Total Joint Exercises Quad Sets: AROM;Right;20 reps Short Arc QuadBarbaraann Boys;Right;20 reps Heel Slides: AAROM;Right;20 reps Straight Leg Raises: AAROM;20 reps   PT Diagnosis:    PT Problem List:   PT Treatment Interventions:     PT Goals Acute Rehab PT Goals PT Goal: Perform Home Exercise Program - Progress: Progressing toward goal  Visit Information  Last PT Received On: 01/06/13 Assistance Needed: +1    Subjective Data      Cognition  Overall Cognitive Status: Appears within functional limits for tasks assessed/performed Arousal/Alertness: Awake/alert Orientation Level: Appears intact for tasks assessed Behavior During Session: [facility] for tasks performed    Balance     End of Session PT - End of Session Activity Tolerance: Patient tolerated treatment well Patient left: in chair;with call bell/phone within reach   GP     Robinette, Adline Potter 01/06/2013, 2:57 PM 01/06/2013 Fredrich Birks PTA [phone] pager [phone] office

## 2013-01-06 NOTE — Progress Notes (Addendum)
Pt refused Lovenox inj explained importance to prevent bl  Clots since he wasn't as mobile and had surgery.Pt still refused Dr Irene Limbo pagedPharmacy to talk c pt.

## 2013-01-07 ENCOUNTER — Inpatient Hospital Stay (HOSPITAL_COMMUNITY): Payer: BC Managed Care – PPO

## 2013-01-07 ENCOUNTER — Encounter (HOSPITAL_COMMUNITY): Payer: Self-pay | Admitting: General Practice

## 2013-01-07 LAB — GLUCOSE, CAPILLARY
Glucose-Capillary: 134 mg/dL — ABNORMAL HIGH (ref 70–99)
Glucose-Capillary: 133 mg/dL — ABNORMAL HIGH (ref 70–99)

## 2013-01-07 LAB — BODY FLUID CULTURE: Culture: NO GROWTH

## 2013-01-07 LAB — CBC
HCT: 32.9 % — ABNORMAL LOW (ref 39.0–52.0)
MCV: 74.9 fL — ABNORMAL LOW (ref 78.0–100.0)
RBC: 4.39 MIL/uL (ref 4.22–5.81)
WBC: 5.6 10*3/uL (ref 4.0–10.5)

## 2013-01-07 LAB — VANCOMYCIN, TROUGH: Vancomycin Tr: 14.1 ug/mL (ref 10.0–20.0)

## 2013-01-07 MED ORDER — VANCOMYCIN HCL IN DEXTROSE 1-5 GM/200ML-% IV SOLN: 1250.0000 mg | Freq: Three times a day (TID) | INTRAVENOUS | Status: DC

## 2013-01-07 MED ORDER — VANCOMYCIN HCL 10 G IV SOLR
1250.0000 mg | Freq: Three times a day (TID) | INTRAVENOUS | Status: DC
  Administered 2013-01-07: 1250 mg via INTRAVENOUS
  Filled 2013-01-07 (×3): qty 1250

## 2013-01-07 MED ORDER — WARFARIN SODIUM 7.5 MG PO TABS
7.5000 mg | ORAL_TABLET | Freq: Once | ORAL | Status: AC
  Administered 2013-01-07: 7.5 mg via ORAL
  Filled 2013-01-07: qty 1

## 2013-01-07 MED ORDER — VANCOMYCIN HCL 10 G IV SOLR: 1250.0000 mg | Freq: Three times a day (TID) | INTRAVENOUS | Status: DC

## 2013-01-07 MED ORDER — VANCOMYCIN HCL 10 G IV SOLR
1250.0000 mg | Freq: Three times a day (TID) | INTRAVENOUS | Status: DC
  Filled 2013-01-07: qty 1250

## 2013-01-07 MED ORDER — SODIUM CHLORIDE 0.9 % IJ SOLN: 10.0000 mL | INTRAMUSCULAR | Status: DC | PRN

## 2013-01-07 NOTE — Progress Notes (Signed)
PT Cancellation Note  Patient Details Name: Spencer English MRN: [id number] DOB: 10-Apr-1966   Cancelled Treatment:    Reason Eval/Treat Not Completed: Pain limiting ability to participate   Fredrich Birks 01/07/2013, 1:48 PM

## 2013-01-07 NOTE — Progress Notes (Signed)
ANTICOAGULATION CONSULT NOTE - Follow Up Consult  Pharmacy Consult for Vancomycin Indication:  septic joint  Allergies  Allergen Reactions  . Metformin     REACTION: nausea/diarrhea    Patient Measurements: Height: 5\' 10"  (177.8 cm) Weight: 253 lb (114.76 kg) IBW/kg (Calculated) : 73   Vital Signs: Temp: 97.2 F (36.2 C) (01/10 1400) BP: 105/56 mmHg (01/10 1400) Pulse Rate: 72  (01/10 1400)  Labs:  Basename 01/07/13 0545 01/06/13 0530 01/05/13 0510  HGB 10.3* 10.8* --  HCT 32.9* 34.6* 34.7*  PLT 204 200 194  APTT -- -- --  LABPROT 29.9* 27.2* 18.3*  INR 3.05* 2.68* 1.57*  HEPARINUNFRC -- -- --  CREATININE -- -- --  CKTOTAL -- -- --  CKMB -- -- --  TROPONINI -- -- --    Estimated Creatinine Clearance: 144.8 ml/min (by C-G formula based on Cr of 0.72).   Medications:  Scheduled:     . atorvastatin  20 mg Oral q1800  . cefTRIAXone (ROCEPHIN)  IV  1 g Intravenous Q24H  . docusate sodium  100 mg Oral BID  . furosemide  40 mg Oral Daily  . insulin aspart  0-15 Units Subcutaneous TID WC  . insulin aspart  0-5 Units Subcutaneous QHS  . insulin aspart  4 Units Subcutaneous TID WC  . irbesartan  300 mg Oral Daily  . metoprolol tartrate  25 mg Oral BID  . multivitamin with minerals  1 tablet Oral Daily  . mupirocin ointment   Nasal BID  . vancomycin  1,250 mg Intravenous Q8H  . verapamil  240 mg Oral Daily  . [COMPLETED] warfarin  5 mg Oral ONCE-1800  . warfarin  7.5 mg Oral ONCE-1800  . Warfarin - Pharmacist Dosing Inpatient   Does not apply q1800  . [DISCONTINUED] vancomycin  1,000 mg Intravenous Q8H   Vanc 1/6>> Rocephin 1/6>>  1/3 Aspirate (from office) >> MRSA per MD notes 1/6 Blood cx 1/6 Synovial Fluid   Assessment: 47 yo M admitted with septic arthritis s/p I&D washout 01/03/13.  Cx pending.  Vanc trough level is below goal at 14.1 on vancomycin 1g IV q 8 hrs.  Goal of Therapy:  Vancomycin trough 15-20 mcg/ml   Plan:  Increase Vancomycin to  1250mg  IV q8hrs.   F/u renal func and vanc trough level at steady state.   Reece Leader, Pharm D 01/07/2013 5:11 PM

## 2013-01-07 NOTE — Progress Notes (Signed)
Peripherally Inserted Central Catheter/Midline Placement  The IV Nurse has discussed with the patient and/or persons authorized to consent for the patient, the purpose of this procedure and the potential benefits and risks involved with this procedure.  The benefits include less needle sticks, lab draws from the catheter and patient may be discharged home with the catheter.  Risks include, but not limited to, infection, bleeding, blood clot (thrombus formation), and puncture of an artery; nerve damage and irregular heat beat.  Alternatives to this procedure were also discussed.  PICC/Midline Placement Documentation        Spencer English 01/07/2013, 3:07 PM

## 2013-01-07 NOTE — Progress Notes (Signed)
ANTICOAGULATION CONSULT NOTE - Follow Up Consult  Pharmacy Consult for coumadin Indication: hx of DVT/PE  Allergies  Allergen Reactions  . Metformin     REACTION: nausea/diarrhea    Patient Measurements: Height: 5\' 10"  (177.8 cm) Weight: 253 lb (114.76 kg) IBW/kg (Calculated) : 73  Heparin Dosing Weight:   Vital Signs: Temp: 98.7 F (37.1 C) (01/10 0505) BP: 120/64 mmHg (01/10 0505) Pulse Rate: 92  (01/10 0505)  Labs:  Basename 01/07/13 0545 01/06/13 0530 01/05/13 0510  HGB 10.3* 10.8* --  HCT 32.9* 34.6* 34.7*  PLT 204 200 194  APTT -- -- --  LABPROT 29.9* 27.2* 18.3*  INR 3.05* 2.68* 1.57*  HEPARINUNFRC -- -- --  CREATININE -- -- --  CKTOTAL -- -- --  CKMB -- -- --  TROPONINI -- -- --    Estimated Creatinine Clearance: 144.8 ml/min (by C-G formula based on Cr of 0.72).   Medications:  Scheduled:    . atorvastatin  20 mg Oral q1800  . cefTRIAXone (ROCEPHIN)  IV  1 g Intravenous Q24H  . docusate sodium  100 mg Oral BID  . furosemide  40 mg Oral Daily  . insulin aspart  0-15 Units Subcutaneous TID WC  . insulin aspart  0-5 Units Subcutaneous QHS  . insulin aspart  4 Units Subcutaneous TID WC  . irbesartan  300 mg Oral Daily  . metoprolol tartrate  25 mg Oral BID  . multivitamin with minerals  1 tablet Oral Daily  . mupirocin ointment   Nasal BID  . vancomycin  1,000 mg Intravenous Q8H  . verapamil  240 mg Oral Daily  . [COMPLETED] warfarin  5 mg Oral ONCE-1800  . warfarin  7.5 mg Oral ONCE-1800  . Warfarin - Pharmacist Dosing Inpatient   Does not apply q1800  . [DISCONTINUED] enoxaparin (LOVENOX) injection  120 mg Subcutaneous BID  . [DISCONTINUED] metoprolol tartrate  50 mg Oral BID   Infusions:    . 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  Home dose 7.5mg  po qday  Goal of Therapy:  INR 2-3      Plan:  1) Coumadin 7.5mg  po x1 2) INR in am.  Sherwood Castilla, Tsz-Yin 01/07/2013,8:26 AM

## 2013-01-08 LAB — ANAEROBIC CULTURE

## 2013-01-09 LAB — CULTURE, BLOOD (SINGLE)

## 2013-01-10 ENCOUNTER — Ambulatory Visit: Payer: BC Managed Care – PPO | Admitting: Endocrinology

## 2013-01-10 ENCOUNTER — Ambulatory Visit: Payer: Self-pay | Admitting: Cardiology

## 2013-01-10 DIAGNOSIS — Z0289 Encounter for other administrative examinations: Secondary | ICD-10-CM

## 2013-01-10 DIAGNOSIS — I2699 Other pulmonary embolism without acute cor pulmonale: Secondary | ICD-10-CM

## 2013-01-10 DIAGNOSIS — Z7901 Long term (current) use of anticoagulants: Secondary | ICD-10-CM

## 2013-01-10 DIAGNOSIS — I82409 Acute embolism and thrombosis of unspecified deep veins of unspecified lower extremity: Secondary | ICD-10-CM

## 2013-01-10 LAB — POCT INR: INR: 2.4

## 2013-01-10 NOTE — Discharge Summary (Signed)
Home PATIENT ID: Spencer English        MRN:  [id number]          DOB/AGE: [birth year] / 47 y.o.    DISCHARGE SUMMARY  ADMISSION DATE:    01/03/2013 DISCHARGE DATE:   01/07/13  ADMISSION DIAGNOSIS: infected knee    DISCHARGE DIAGNOSIS:  infected knee    ADDITIONAL DIAGNOSIS: Active Problems:  DM  DYSLIPIDEMIA  GOUT  HYPERTENSION  PULMONARY EMBOLISM  DEEP VENOUS THROMBOPHLEBITIS, LEG, LEFT  Atrial fibrillation  Past Medical History  Diagnosis Date  . DM 08/08/2008  . DYSLIPIDEMIA 04/26/2009  . GOUT 10/24/2010  . HYPERTENSION 07/21/2007  . PULMONARY EMBOLISM 05/03/2009  . DEEP VENOUS THROMBOPHLEBITIS, LEG, LEFT 05/05/2009  . ALLERGIC RHINITIS 04/12/2009  . UNSPECIFIED URINARY CALCULUS 07/04/2010  . 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 ECZEMA 05/23/2010  . Long term (current) use of anticoagulants 04/03/2011  . BACK PAIN, LUMBAR 01/14/2011  . 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 Hyperglycemia   . Leukopenia   . Atrial fibrillation   . GERD (gastroesophageal reflux disease)     PROCEDURE: Procedure(s): ARTHROSCOPY KNEE  Left  on 01/03/2013 Irrigation and Debridement, menisectomy/debridement, removal loose body.  CONSULTS:   Hospitalist and Infectious disease  HISTORY: See admission H&P   HOSPITAL COURSE:  Spencer English is a 47 y.o. admitted on 01/03/2013 and found to have a diagnosis of infected knee.  After appropriate laboratory studies were obtained  they were taken to the operating room on 01/03/2013 and underwent  Left Procedure(s): ARTHROSCOPY KNEE.   They were given perioperative antibiotics:  Anti-infectives     Start     Dose/Rate Route Frequency Ordered Stop   01/08/13 0100   vancomycin (VANCOCIN) 1,250 mg in sodium chloride 0.9 % 250 mL IVPB  Status:  Discontinued        1,250 mg 166.7 mL/hr over 90 Minutes Intravenous Every 8 hours 01/07/13 1710 01/07/13 1742   01/07/13 1800   vancomycin (VANCOCIN) 1,250 mg in sodium chloride 0.9 % 250 mL IVPB  Status:  Discontinued        1,250 mg 166.7 mL/hr over 90 Minutes Intravenous Every 8 hours 01/07/13 1742 01/08/13 0244   01/07/13 0000   sodium chloride 0.9 % SOLN 250 mL with vancomycin 10 G SOLR 1,250 mg        1,250 mg 166.7 mL/hr over 90 Minutes Intravenous Every 8 hours 01/07/13 1841     01/07/13 0000   vancomycin (VANCOCIN) 1 GM/200ML SOLN     Comments: rx may be decreased to bid once therapeutic to be managed by ID doctor      1,250 mg 250 mL/hr over 60 Minutes Intravenous Every 8 hours 01/07/13 1947     01/06/13 0200   vancomycin (VANCOCIN) IVPB 1000 mg/200 mL premix  Status:  Discontinued        1,000 mg 200 mL/hr over 60 Minutes Intravenous Every 8 hours 01/05/13 1902 01/07/13 1710   01/06/13 0000   dextrose 5 % SOLN 50 mL with cefTRIAXone 1 G SOLR 1 g     Comments: Pt needs 30 day supply, 1g ceftriaxone IVPB mixture  daily      1 g 100 mL/hr over 30 Minutes Intravenous Every 24 hours 01/06/13 1408     01/06/13 0000   vancomycin (VANCOCIN) 1 GM/200ML SOLN  Status:  Discontinued     Comments: rx may be decreased to bid once therapeutic to be managed by ID doctor      1,000  mg 200 mL/hr over 60 Minutes Intravenous Every 8 hours January 11, 2013 1408 2013/01/12    01/04/13 0600   vancomycin (VANCOCIN) IVPB 1000 mg/200 mL premix  Status:  Discontinued        1,000 mg 200 mL/hr over 60 Minutes Intravenous Every 12 hours 01/03/13 1727 Jan 11, 2013 0201   01/04/13 0530   cefTRIAXone (ROCEPHIN) 1 g in dextrose 5 % 50 mL IVPB  Status:  Discontinued        1 g 100 mL/hr over 30 Minutes Intravenous Every 24 hours 01/03/13 1646 01/08/13 0244   01/03/13 1800   vancomycin (VANCOCIN) IVPB 1000 mg/200 mL premix  Status:  Discontinued        1,000 mg 200 mL/hr over 60 Minutes Intravenous Every 12 hours 01/03/13 1711 01/03/13 1727   01/03/13 1800   vancomycin (VANCOCIN) 2,000 mg in sodium chloride 0.9 % 500 mL IVPB        2,000 mg 250 mL/hr over 120 Minutes Intravenous  Once 01/03/13 1727 01/03/13 2036         .  Tolerated the procedure well.    POD #1, allowed WBAT with crutches/walker.  PT for ambulation and exercise program.  IV saline locked.  O2 discontionued. Cultures negative continued IV abx as recommended by ID.  Lovenox/coumadin to therapeutic dosing for afib.  POD #2, continued PT and ambulation.   Hemovac pulled. Dressing changed.  Continued abx and anticoagulation.  POD#3 discharge plans made, order placed for PICC line placement for home abx.  Continued PT/abx/anticoag.  POD#4 still awaiting PICC placement for discharge, reordered through interventional radiology, PICC placed, pt d/c home in stable condition.   The remainder of the hospital course was dedicated to ambulation and strengthening.   The patient was discharged on 4 days post op in  Stable condition.  Blood products given:  none  DIAGNOSTIC STUDIES: Recent vital signs: No data found.      Recent laboratory studies:  Basename 01/12/2013 0545 01/11/13 0530 01/05/13 0510 01/04/13 0630  WBC 5.6 6.2 7.1 7.0  HGB 10.3* 10.8* 11.0* 11.2*  HCT 32.9* 34.6* 34.7* 34.6*  PLT 204 200 194 188    Basename 01/04/13 0630  NA 136  K 4.2  CL 101  CO2 25  BUN 9  CREATININE 0.72  GLUCOSE 155*  CALCIUM 8.8   Lab Results  Component Value Date   INR 3.05* 01/12/13   INR 2.68* 2013-01-11   INR 1.57* 01/05/2013   PROTIME 20.9 05/31/2009     Recent Radiographic Studies :  Dg Chest Port 1 View  2013/01/12  *RADIOLOGY REPORT*  Clinical Data: Bedside PICC placement.  PORTABLE CHEST - 1 VIEW  Comparison: Portable chest x-ray 01/03/2013.  Findings: Right arm PICC tip projects over the lower SVC.  Cardiac silhouette upper normal in size for the AP portable technique, unchanged.  Interval resolution of the previously described left basilar retrocardiac opacity.  Lungs now clear.  IMPRESSION:  1.  Right arm PICC tip projects over the lower SVC. 2.  No acute cardiopulmonary disease.   Original Report Authenticated By: Hulan Saas, M.D.     Dg Chest Portable 1 View  01/03/2013  *RADIOLOGY REPORT*  Clinical Data: 47 year old male preoperative study.  History of pulmonary embolus in 2010.  PORTABLE CHEST - 1 VIEW  Comparison: 09/19/2012 chest CTA and earlier.  Findings: AP view at 1222 hours.  Mildly lower lung volumes. Cardiac size and mediastinal contours are within normal limits. Visualized tracheal air column is within normal  limits.  No pneumothorax or pulmonary edema.  No pleural effusion.  Mildly increased retrocardiac opacity on the left.  IMPRESSION: Mildly increased retrocardiac opacity on the left.  Favor atelectasis, but upright lateral view would be valuable to exclude consolidation/pneumonia if possible.   Original Report Authenticated By: Erskine Speed, M.D.    Dg Knee Complete 4 Views Left  01/03/2013  *RADIOLOGY REPORT*  Clinical Data: Knee pain and swelling.  History of gout.  LEFT KNEE - COMPLETE 4+ VIEW  Comparison: None.  Findings: There is no fracture or dislocation.  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  Small joint effusion is noted. Loose bodies are identified in the joint.  One of the largest measures 1.2 cm and is best seen on the lateral view.  IMPRESSION:  1.  No acute abnormality. 2.  Advance for age degenerative disease.   Original Report Authenticated By: Holley Dexter, M.D.     DISCHARGE INSTRUCTIONS: Discharge Orders    Future Appointments: Provider: Department: Dept Phone: Center:   01/10/2013 2:45 PM Romero Belling, MD [facility] [phone] None   01/25/2013 11:15 AM Judyann Munson, MD [facility] [phone] [facility]   01/31/2013 3:45 PM Hillis Range, MD  [facility] [place]) [phone] [id number]      DISCHARGE MEDICATIONS:     Medication List     As of 01/10/2013 12:10 PM    STOP taking these medications         acetaminophen 500 MG tablet   Commonly known as: TYLENOL       doxycycline 100 MG capsule   Commonly known as: VIBRAMYCIN      tadalafil 5 MG tablet   Commonly known as: CIALIS      TAKE these medications         allopurinol 300 MG tablet   Commonly known as: ZYLOPRIM   Take 1 tablet (300 mg total) by mouth as needed. For gout flare-up.      atorvastatin 20 MG tablet   Commonly known as: LIPITOR   Take 1 tablet (20 mg total) by mouth daily.      dextrose 5 % SOLN 50 mL with cefTRIAXone 1 G SOLR 1 g   Inject 1 g into the vein daily.      fexofenadine 180 MG tablet   Commonly known as: ALLEGRA   Take 180 mg by mouth daily as needed. For allergies.      fluticasone 50 MCG/ACT nasal spray   Commonly known as: FLONASE   Place 2 sprays into the nose daily as needed. For allergies.      furosemide 40 MG tablet   Commonly known as: LASIX   Take 1 tablet (40 mg total) by mouth daily.      halobetasol 0.05 % cream   Commonly known as: ULTRAVATE   APPLY  CREAM TOPICALLY THREE TIMES DAILY AS NEEDED FOR RASH      metoprolol tartrate 25 MG tablet   Commonly known as: LOPRESSOR   Take 1 tablet (25 mg total) by mouth 2 (two) times daily.      metoprolol tartrate 25 MG tablet   Commonly known as: LOPRESSOR   Take 1 tablet (25 mg total) by mouth 2 (two) times daily.      multivitamin with minerals Tabs   Take 1 tablet by mouth daily.      olmesartan 40 MG tablet   Commonly known as: BENICAR   Take 40  mg by mouth daily.      oxyCODONE-acetaminophen 5-325 MG per tablet   Commonly known as: PERCOCET/ROXICET   1-2 tabs po q4-6hrs prn pain      sitaGLIPtin 100 MG tablet   Commonly known as: JANUVIA   Take 1 tablet (100 mg total) by mouth daily.      sodium chloride 0.9 % injection   10-40 mLs by Intracatheter route as needed (flush).      sodium chloride 0.9 % SOLN 250 mL with vancomycin 10 G SOLR 1,250 mg   Inject 1,250 mg into the vein every 8 (eight) hours.      vancomycin 1 GM/200ML Soln   Commonly known as: VANCOCIN   Inject 250  mLs (1,250 mg total) into the vein every 8 (eight) hours.      warfarin 5 MG tablet   Commonly known as: COUMADIN   Take 7.5 mg by mouth daily.        FOLLOW UP VISIT:       Follow-up Information    Schedule an appointment as soon as possible for a visit with CAFFREY JR,W D, MD. (to be seen on 01/13/13 )    Contact information:   [address] [place] [address] [phone]       Schedule an appointment as soon as possible for a visit with Johny Sax, MD. (to be seen in 4 weeks)    Contact information:   [address]  [address] [place] [address] [phone]          DISPOSITION:  Home with HH  CONDITION:  {Stable  Margart Sickles 01/10/2013, 12:10 PM

## 2013-01-16 ENCOUNTER — Other Ambulatory Visit: Payer: Self-pay | Admitting: Endocrinology

## 2013-01-18 ENCOUNTER — Telehealth: Payer: Self-pay | Admitting: Endocrinology

## 2013-01-18 NOTE — Telephone Encounter (Signed)
Forward 2 pages from [facility] to Dr. Romero Belling for review on 12-29-12 ym

## 2013-01-22 ENCOUNTER — Inpatient Hospital Stay (HOSPITAL_COMMUNITY)
Admission: EM | Admit: 2013-01-22 | Discharge: 2013-01-27 | DRG: 543 | Disposition: A | Discharge status: Home Health | Payer: BC Managed Care – PPO | Attending: Internal Medicine | Admitting: Internal Medicine

## 2013-01-22 DIAGNOSIS — I952 Hypotension due to drugs: Secondary | ICD-10-CM | POA: Diagnosis present

## 2013-01-22 DIAGNOSIS — M009 Pyogenic arthritis, unspecified: Secondary | ICD-10-CM | POA: Diagnosis present

## 2013-01-22 DIAGNOSIS — K219 Gastro-esophageal reflux disease without esophagitis: Secondary | ICD-10-CM | POA: Diagnosis present

## 2013-01-22 DIAGNOSIS — E875 Hyperkalemia: Secondary | ICD-10-CM | POA: Diagnosis present

## 2013-01-22 DIAGNOSIS — E119 Type 2 diabetes mellitus without complications: Secondary | ICD-10-CM | POA: Diagnosis present

## 2013-01-22 DIAGNOSIS — E860 Dehydration: Secondary | ICD-10-CM

## 2013-01-22 DIAGNOSIS — N529 Male erectile dysfunction, unspecified: Secondary | ICD-10-CM | POA: Diagnosis present

## 2013-01-22 DIAGNOSIS — IMO0002 Reserved for concepts with insufficient information to code with codable children: Secondary | ICD-10-CM | POA: Diagnosis present

## 2013-01-22 DIAGNOSIS — R079 Chest pain, unspecified: Secondary | ICD-10-CM

## 2013-01-22 DIAGNOSIS — E118 Type 2 diabetes mellitus with unspecified complications: Secondary | ICD-10-CM | POA: Diagnosis present

## 2013-01-22 DIAGNOSIS — J309 Allergic rhinitis, unspecified: Secondary | ICD-10-CM

## 2013-01-22 DIAGNOSIS — R55 Syncope and collapse: Secondary | ICD-10-CM | POA: Diagnosis present

## 2013-01-22 DIAGNOSIS — N179 Acute kidney failure, unspecified: Secondary | ICD-10-CM

## 2013-01-22 DIAGNOSIS — Z86718 Personal history of other venous thrombosis and embolism: Secondary | ICD-10-CM

## 2013-01-22 DIAGNOSIS — Z794 Long term (current) use of insulin: Secondary | ICD-10-CM

## 2013-01-22 DIAGNOSIS — E0590 Thyrotoxicosis, unspecified without thyrotoxic crisis or storm: Secondary | ICD-10-CM | POA: Diagnosis present

## 2013-01-22 DIAGNOSIS — Z8249 Family history of ischemic heart disease and other diseases of the circulatory system: Secondary | ICD-10-CM

## 2013-01-22 DIAGNOSIS — R579 Shock, unspecified: Secondary | ICD-10-CM

## 2013-01-22 DIAGNOSIS — R569 Unspecified convulsions: Secondary | ICD-10-CM

## 2013-01-22 DIAGNOSIS — Z8739 Personal history of other diseases of the musculoskeletal system and connective tissue: Secondary | ICD-10-CM

## 2013-01-22 DIAGNOSIS — E1169 Type 2 diabetes mellitus with other specified complication: Secondary | ICD-10-CM | POA: Diagnosis present

## 2013-01-22 DIAGNOSIS — I272 Pulmonary hypertension, unspecified: Secondary | ICD-10-CM | POA: Diagnosis present

## 2013-01-22 DIAGNOSIS — Z7901 Long term (current) use of anticoagulants: Secondary | ICD-10-CM

## 2013-01-22 DIAGNOSIS — D509 Iron deficiency anemia, unspecified: Secondary | ICD-10-CM | POA: Diagnosis present

## 2013-01-22 DIAGNOSIS — I10 Essential (primary) hypertension: Secondary | ICD-10-CM | POA: Diagnosis present

## 2013-01-22 DIAGNOSIS — Z79899 Other long term (current) drug therapy: Secondary | ICD-10-CM

## 2013-01-22 DIAGNOSIS — N170 Acute kidney failure with tubular necrosis: Secondary | ICD-10-CM | POA: Diagnosis present

## 2013-01-22 DIAGNOSIS — Z833 Family history of diabetes mellitus: Secondary | ICD-10-CM

## 2013-01-22 DIAGNOSIS — I9589 Other hypotension: Principal | ICD-10-CM | POA: Diagnosis present

## 2013-01-22 DIAGNOSIS — M109 Gout, unspecified: Secondary | ICD-10-CM | POA: Diagnosis present

## 2013-01-22 DIAGNOSIS — I82409 Acute embolism and thrombosis of unspecified deep veins of unspecified lower extremity: Secondary | ICD-10-CM

## 2013-01-22 DIAGNOSIS — Z87891 Personal history of nicotine dependence: Secondary | ICD-10-CM

## 2013-01-22 DIAGNOSIS — E785 Hyperlipidemia, unspecified: Secondary | ICD-10-CM | POA: Diagnosis present

## 2013-01-22 DIAGNOSIS — D696 Thrombocytopenia, unspecified: Secondary | ICD-10-CM | POA: Diagnosis present

## 2013-01-22 DIAGNOSIS — I4891 Unspecified atrial fibrillation: Secondary | ICD-10-CM | POA: Diagnosis present

## 2013-01-22 DIAGNOSIS — I4892 Unspecified atrial flutter: Secondary | ICD-10-CM | POA: Diagnosis present

## 2013-01-22 NOTE — ED Notes (Signed)
PER EMS- Pt was Dx with A-fib. Told by primary not to take Cialis. Pt took Cialis tonight approx. 2 hours ago. Pt reports 1 syncopal episode after a period of dizziness. Patient states he passed out on the couch. BP 80/60. 20 L AC.

## 2013-01-23 ENCOUNTER — Encounter (HOSPITAL_COMMUNITY): Payer: Self-pay | Admitting: Emergency Medicine

## 2013-01-23 ENCOUNTER — Emergency Department (HOSPITAL_COMMUNITY): Payer: BC Managed Care – PPO

## 2013-01-23 ENCOUNTER — Inpatient Hospital Stay (HOSPITAL_COMMUNITY): Payer: BC Managed Care – PPO

## 2013-01-23 DIAGNOSIS — R6521 Severe sepsis with septic shock: Secondary | ICD-10-CM

## 2013-01-23 DIAGNOSIS — Z7901 Long term (current) use of anticoagulants: Secondary | ICD-10-CM

## 2013-01-23 DIAGNOSIS — I4891 Unspecified atrial fibrillation: Secondary | ICD-10-CM

## 2013-01-23 DIAGNOSIS — Z8739 Personal history of other diseases of the musculoskeletal system and connective tissue: Secondary | ICD-10-CM

## 2013-01-23 DIAGNOSIS — A419 Sepsis, unspecified organism: Secondary | ICD-10-CM

## 2013-01-23 DIAGNOSIS — IMO0002 Reserved for concepts with insufficient information to code with codable children: Secondary | ICD-10-CM

## 2013-01-23 DIAGNOSIS — I959 Hypotension, unspecified: Secondary | ICD-10-CM

## 2013-01-23 DIAGNOSIS — I952 Hypotension due to drugs: Secondary | ICD-10-CM | POA: Diagnosis present

## 2013-01-23 LAB — URINALYSIS, ROUTINE W REFLEX MICROSCOPIC
Ketones, ur: NEGATIVE mg/dL
Leukocytes, UA: NEGATIVE
Nitrite: NEGATIVE
Protein, ur: >300 mg/dL — AB
Urobilinogen, UA: 1 mg/dL (ref 0.0–1.0)
pH: 5 (ref 5.0–8.0)

## 2013-01-23 LAB — BASIC METABOLIC PANEL
BUN: 17 mg/dL (ref 6–23)
CO2: 18 mEq/L — ABNORMAL LOW (ref 19–32)
Chloride: 105 mEq/L (ref 96–112)
Chloride: 106 mEq/L (ref 96–112)
Chloride: 105 mEq/L (ref 96–112)
Creatinine, Ser: 1.45 mg/dL — ABNORMAL HIGH (ref 0.50–1.35)
GFR calc Af Amer: 45 mL/min — ABNORMAL LOW (ref >90)
GFR calc Af Amer: 29 mL/min — ABNORMAL LOW (ref >90)
Potassium: 5.2 mEq/L — ABNORMAL HIGH (ref 3.5–5.1)
Potassium: 3.9 mEq/L (ref 3.5–5.1)
Sodium: 138 mEq/L (ref 135–145)

## 2013-01-23 LAB — CBC WITH DIFFERENTIAL/PLATELET
Basophils Absolute: 0 10*3/uL (ref 0.0–0.1)
Eosinophils Relative: 2 % (ref 0–5)
HCT: 33.9 % — ABNORMAL LOW (ref 39.0–52.0)
Hemoglobin: 10.4 g/dL — ABNORMAL LOW (ref 13.0–17.0)
Lymphocytes Relative: 21 % (ref 12–46)
Lymphs Abs: 1.6 10*3/uL (ref 0.7–4.0)
MCV: 77.2 fL — ABNORMAL LOW (ref 78.0–100.0)
Monocytes Absolute: 0.8 10*3/uL (ref 0.1–1.0)
Monocytes Relative: 10 % (ref 3–12)
RDW: 16.3 % — ABNORMAL HIGH (ref 11.5–15.5)
WBC: 7.5 10*3/uL (ref 4.0–10.5)

## 2013-01-23 LAB — OSMOLALITY, URINE: Osmolality, Ur: 357 mOsm/kg — ABNORMAL LOW (ref 390–1090)

## 2013-01-23 LAB — CBC
HCT: 35.4 % — ABNORMAL LOW (ref 39.0–52.0)
Platelets: 214 10*3/uL (ref 150–400)
RBC: 4.67 MIL/uL (ref 4.22–5.81)
RDW: 16 % — ABNORMAL HIGH (ref 11.5–15.5)
WBC: 11.3 10*3/uL — ABNORMAL HIGH (ref 4.0–10.5)

## 2013-01-23 LAB — GLUCOSE, CAPILLARY
Glucose-Capillary: 215 mg/dL — ABNORMAL HIGH (ref 70–99)
Glucose-Capillary: 223 mg/dL — ABNORMAL HIGH (ref 70–99)
Glucose-Capillary: 189 mg/dL — ABNORMAL HIGH (ref 70–99)
Glucose-Capillary: 149 mg/dL — ABNORMAL HIGH (ref 70–99)
Glucose-Capillary: 78 mg/dL (ref 70–99)

## 2013-01-23 LAB — VANCOMYCIN, RANDOM: Vancomycin Rm: 10 ug/mL

## 2013-01-23 LAB — URINE MICROSCOPIC-ADD ON

## 2013-01-23 LAB — MRSA PCR SCREENING: MRSA by PCR: NEGATIVE

## 2013-01-23 LAB — POCT I-STAT TROPONIN I: Troponin i, poc: 0.01 ng/mL (ref 0.00–0.08)

## 2013-01-23 LAB — MAGNESIUM: Magnesium: 1.5 mg/dL (ref 1.5–2.5)

## 2013-01-23 MED ORDER — SODIUM CHLORIDE 0.9 % IV BOLUS (SEPSIS)
1000.0000 mL | Freq: Once | INTRAVENOUS | Status: AC
  Administered 2013-01-23: 1000 mL via INTRAVENOUS

## 2013-01-23 MED ORDER — METOPROLOL TARTRATE 25 MG PO TABS
25.0000 mg | ORAL_TABLET | Freq: Two times a day (BID) | ORAL | Status: DC
  Filled 2013-01-23 (×2): qty 1

## 2013-01-23 MED ORDER — ACETAMINOPHEN 325 MG PO TABS
650.0000 mg | ORAL_TABLET | Freq: Four times a day (QID) | ORAL | Status: DC | PRN
  Administered 2013-01-23: 650 mg via ORAL
  Filled 2013-01-23: qty 2

## 2013-01-23 MED ORDER — WARFARIN SODIUM 7.5 MG PO TABS
7.5000 mg | ORAL_TABLET | Freq: Every day | ORAL | Status: DC
  Filled 2013-01-23: qty 1

## 2013-01-23 MED ORDER — ATORVASTATIN CALCIUM 20 MG PO TABS
20.0000 mg | ORAL_TABLET | Freq: Every day | ORAL | Status: DC
  Administered 2013-01-23 – 2013-01-26 (×4): 20 mg via ORAL
  Filled 2013-01-23 (×5): qty 1

## 2013-01-23 MED ORDER — WARFARIN SODIUM 7.5 MG PO TABS
7.5000 mg | ORAL_TABLET | Freq: Once | ORAL | Status: AC
  Administered 2013-01-23: 7.5 mg via ORAL
  Filled 2013-01-23: qty 1

## 2013-01-23 MED ORDER — LINAGLIPTIN 5 MG PO TABS
5.0000 mg | ORAL_TABLET | Freq: Every day | ORAL | Status: DC
  Administered 2013-01-23 – 2013-01-27 (×5): 5 mg via ORAL
  Filled 2013-01-23 (×5): qty 1

## 2013-01-23 MED ORDER — DEXTROSE 5 % IV SOLN
1.0000 g | INTRAVENOUS | Status: DC
  Administered 2013-01-23 – 2013-01-27 (×5): 1 g via INTRAVENOUS
  Filled 2013-01-23 (×5): qty 10

## 2013-01-23 MED ORDER — NOREPINEPHRINE BITARTRATE 1 MG/ML IJ SOLN
INTRAMUSCULAR | Status: AC
  Administered 2013-01-23: 4 mg
  Filled 2013-01-23: qty 4

## 2013-01-23 MED ORDER — SODIUM POLYSTYRENE SULFONATE 15 GM/60ML PO SUSP
30.0000 g | Freq: Once | ORAL | Status: AC
  Administered 2013-01-23: 30 g via ORAL
  Filled 2013-01-23: qty 120

## 2013-01-23 MED ORDER — VANCOMYCIN HCL 10 G IV SOLR: 1250.0000 mg | Freq: Three times a day (TID) | INTRAVENOUS | Status: DC

## 2013-01-23 MED ORDER — ONDANSETRON HCL 4 MG/2ML IJ SOLN
4.0000 mg | Freq: Once | INTRAMUSCULAR | Status: AC
  Administered 2013-01-23: 4 mg via INTRAVENOUS
  Filled 2013-01-23: qty 2

## 2013-01-23 MED ORDER — NOREPINEPHRINE BITARTRATE 1 MG/ML IJ SOLN
2.0000 ug/min | INTRAVENOUS | Status: DC
  Administered 2013-01-23: 2 ug/min via INTRAVENOUS
  Administered 2013-01-23: 8 ug/min via INTRAVENOUS
  Administered 2013-01-23: 2.5 ug/min via INTRAVENOUS
  Administered 2013-01-23: 15 ug/min via INTRAVENOUS
  Administered 2013-01-23: 14 ug/min via INTRAVENOUS
  Filled 2013-01-23 (×2): qty 4

## 2013-01-23 MED ORDER — SODIUM CHLORIDE 0.9 % IV SOLN
Freq: Once | INTRAVENOUS | Status: AC
  Administered 2013-01-23: 01:00:00 via INTRAVENOUS

## 2013-01-23 MED ORDER — DEXTROSE 5 % IV SOLN
INTRAVENOUS | Status: AC
  Administered 2013-01-23: 02:00:00
  Filled 2013-01-23: qty 250

## 2013-01-23 MED ORDER — LORATADINE 10 MG PO TABS
10.0000 mg | ORAL_TABLET | Freq: Every day | ORAL | Status: DC
  Administered 2013-01-23 – 2013-01-27 (×3): 10 mg via ORAL
  Filled 2013-01-23 (×5): qty 1

## 2013-01-23 MED ORDER — WARFARIN - PHYSICIAN DOSING INPATIENT: Freq: Every day | Status: DC

## 2013-01-23 MED ORDER — VANCOMYCIN HCL 10 G IV SOLR
1250.0000 mg | Freq: Once | INTRAVENOUS | Status: AC
  Administered 2013-01-23: 1250 mg via INTRAVENOUS
  Filled 2013-01-23: qty 1250

## 2013-01-23 MED ORDER — VANCOMYCIN HCL 10 G IV SOLR
1250.0000 mg | Freq: Two times a day (BID) | INTRAVENOUS | Status: DC
  Administered 2013-01-23 – 2013-01-24 (×2): 1250 mg via INTRAVENOUS
  Filled 2013-01-23 (×3): qty 1250

## 2013-01-23 MED ORDER — INSULIN ASPART 100 UNIT/ML ~~LOC~~ SOLN
0.0000 [IU] | Freq: Three times a day (TID) | SUBCUTANEOUS | Status: DC
  Administered 2013-01-23: 3 [IU] via SUBCUTANEOUS
  Administered 2013-01-23: 1 [IU] via SUBCUTANEOUS
  Administered 2013-01-23: 3 [IU] via SUBCUTANEOUS
  Administered 2013-01-24 (×2): 1 [IU] via SUBCUTANEOUS
  Administered 2013-01-25: 2 [IU] via SUBCUTANEOUS
  Administered 2013-01-25 (×2): 1 [IU] via SUBCUTANEOUS
  Administered 2013-01-26 – 2013-01-27 (×2): 2 [IU] via SUBCUTANEOUS

## 2013-01-23 MED ORDER — MAGNESIUM SULFATE 40 MG/ML IJ SOLN
2.0000 g | Freq: Once | INTRAMUSCULAR | Status: AC
  Administered 2013-01-23: 2 g via INTRAVENOUS
  Filled 2013-01-23: qty 50

## 2013-01-23 MED ORDER — LORAZEPAM 2 MG/ML IJ SOLN
INTRAMUSCULAR | Status: AC
  Administered 2013-01-23: 1 mg
  Filled 2013-01-23: qty 1

## 2013-01-23 MED ORDER — SODIUM CHLORIDE 0.9 % IV SOLN
250.0000 mL | INTRAVENOUS | Status: DC | PRN
  Administered 2013-01-25: 250 mL via INTRAVENOUS

## 2013-01-23 NOTE — ED Notes (Signed)
Patient transported to CT 

## 2013-01-23 NOTE — Progress Notes (Signed)
ANTIBIOTIC CONSULT NOTE - INITIAL  Pharmacy Consult for vancomycin, ceftriaxone Indication: Septic joint  Allergies  Allergen Reactions  . Metformin     REACTION: nausea/diarrhea    Patient Measurements:     Vital Signs: Temp: 99.8 F (37.7 C) (01/26 0117) Temp src: Rectal (01/26 0117) BP: 83/61 mmHg (01/26 0355) Pulse Rate: 50  (01/26 0355) Intake/Output from previous day:   Intake/Output from this shift:    Labs:  [facility] 01/23/13 0055  WBC 7.5  HGB 10.4*  PLT 228  LABCREA --  CREATININE 1.45*   The CrCl is unknown because both a height and weight (above a minimum accepted value) are required for this calculation. No results found for this basename: VANCOTROUGH:2,VANCOPEAK:2,VANCORANDOM:2,GENTTROUGH:2,GENTPEAK:2,GENTRANDOM:2,TOBRATROUGH:2,TOBRAPEAK:2,TOBRARND:2,AMIKACINPEAK:2,AMIKACINTROU:2,AMIKACIN:2, in the last 72 hours   Microbiology: Recent Results (from the past 720 hour(s))  CULTURE, BLOOD (SINGLE)     Status: Normal   Collection Time   01/03/13  2:00 AM      Component Value Range Status Comment   Specimen Description BLOOD RIGHT FOREARM   Final    Special Requests BOTTLES DRAWN AEROBIC AND ANAEROBIC 5CC EACH   Final    Culture  Setup Time 01/03/2013 18:09   Final    Culture NO GROWTH 5 DAYS   Final    Report Status 01/09/2013 FINAL   Final   SURGICAL PCR SCREEN     Status: Abnormal   Collection Time   01/03/13 12:36 PM      Component Value Range Status Comment   MRSA, PCR NEGATIVE  NEGATIVE Final    Staphylococcus aureus POSITIVE (*) NEGATIVE Final   BODY FLUID CULTURE     Status: Normal   Collection Time   01/03/13  2:32 PM      Component Value Range Status Comment   Specimen Description FLUID SYNOVIAL LEFT KNEE   Final    Special Requests PT ON VANCO   Final    Gram Stain     Final    Value: MODERATE WBC PRESENT,BOTH PMN AND MONONUCLEAR     NO ORGANISMS SEEN     Performed at [facility]   Culture NO GROWTH 3 DAYS   Final    Report  Status 01/07/2013 FINAL   Final   ANAEROBIC CULTURE     Status: Normal   Collection Time   01/03/13  2:32 PM      Component Value Range Status Comment   Specimen Description FLUID SYNOVIAL LEFT KNEE   Final    Special Requests PT ON VANCO FLUID ON SWAB   Final    Gram Stain     Final    Value: MODERATE WBC PRESENT,BOTH PMN AND MONONUCLEAR     NO ORGANISMS SEEN     Performed at [facility]   Culture NO ANAEROBES ISOLATED   Final    Report Status 01/08/2013 FINAL   Final   GRAM STAIN     Status: Normal   Collection Time   01/03/13  2:32 PM      Component Value Range Status Comment   Specimen Description FLUID SYNOVIAL LEFT KNEE   Final    Special Requests PT ON VANCO FLUID ON SWAB   Final    Gram Stain     Final    Value: MODERATE WBC PRESENT,BOTH PMN AND MONONUCLEAR     NO ORGANISMS SEEN   Report Status 01/03/2013 FINAL   Final     Medical History: Past Medical History  Diagnosis Date  . DM  08/08/2008  . DYSLIPIDEMIA 04/26/2009  . GOUT 10/24/2010  . HYPERTENSION 07/21/2007  . PULMONARY EMBOLISM 05/03/2009  . DEEP VENOUS THROMBOPHLEBITIS, LEG, LEFT 05/05/2009  . ALLERGIC RHINITIS 04/12/2009  . UNSPECIFIED URINARY CALCULUS 07/04/2010  . 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 ECZEMA 05/23/2010  . Long term (current) use of anticoagulants 04/03/2011  . BACK PAIN, LUMBAR 01/14/2011  . 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 Hyperglycemia   . Leukopenia   . Atrial fibrillation   . GERD (gastroesophageal reflux disease)     Medications:  Scheduled:    . [COMPLETED] sodium chloride   Intravenous Once  . atorvastatin  20 mg Oral q1800  . cefTRIAXone (ROCEPHIN) IVPB 1 gram/50 mL D5W  1 g Intravenous Q24H  . [COMPLETED] dextrose      . linagliptin  5 mg Oral Daily  . loratadine  10 mg Oral Daily  . [COMPLETED] LORazepam      . metoprolol tartrate  25 mg Oral BID  . [COMPLETED] norepinephrine      . [COMPLETED] ondansetron (ZOFRAN) IV  4 mg Intravenous Once  . [COMPLETED] sodium chloride   1,000 mL Intravenous Once  . [COMPLETED] sodium chloride  1,000 mL Intravenous Once  . [COMPLETED] sodium chloride  1,000 mL Intravenous Once  . [COMPLETED] sodium chloride  1,000 mL Intravenous Once  . vancomycin (VANCOCIN) 1250 mg IVPB  1,250 mg Intravenous Q8H  . warfarin  7.5 mg Oral q1800  . Warfarin - Physician Dosing Inpatient   Does not apply q1800   Assessment: 47 yo male admitted with medication-induced hypotension (tadalafil). Patient with recent hospitalization (1/6 - 1/10) for septic joint. Plan was for ~ 1 month of antibiotics (ceftriaxone and vancomycin) per ID note. Pharmacy consulted adjust antibiotics for renal function. Per patient, last vancomycin and ceftriaxone dose was ~ 09:00 AM on 1/25.   Random vancomycin level (~ 20 hr post-dose) is 10 mcg/mL. Likely that vancomycin true trough based on home regimen (1250mg  IV Q8H) would be > 20 mcg/mL. Of note, SrCr 1.45 mg/dL on admit (compared to 0.7 on previous admit).   Goal of Therapy:  Vancomycin trough level 15-20 mcg/ml  Plan:  1. Ceftriaxone 1gm IV Q24H.  2. Vancomycin 1250mg  IV Q12H.  3. Follow-up renal function.   Emeline Gins 01/23/2013,4:17 AM

## 2013-01-23 NOTE — ED Notes (Signed)
Dr. Bebe Shaggy at bedside preparing to start central line

## 2013-01-23 NOTE — Progress Notes (Signed)
ANTICOAGULATION CONSULT NOTE - Initial Consult  Pharmacy Consult for Warfarin Indication: Hx PE/DVT/Afib  Allergies  Allergen Reactions  . Metformin     REACTION: nausea/diarrhea    Patient Measurements: Height: 6' (182.9 cm) Weight: 246 lb 7.6 oz (111.8 kg) IBW/kg (Calculated) : 77.6   Vital Signs: Temp: 98.8 F (37.1 C) (01/26 0808) Temp src: Oral (01/26 0808) BP: 107/55 mmHg (01/26 0645) Pulse Rate: 47  (01/26 0645)  Labs:  Basename 01/23/13 0500 01/23/13 0055  HGB 11.1* 10.4*  HCT 35.4* 33.9*  PLT 214 228  APTT -- --  LABPROT -- 22.5*  INR -- 2.08*  HEPARINUNFRC -- --  CREATININE 1.98* 1.45*  CKTOTAL -- --  CKMB -- --  TROPONINI -- --    Estimated Creatinine Clearance: 59.6 ml/min (by C-G formula based on Cr of 1.98).   Medical History: Past Medical History  Diagnosis Date  . DM 08/08/2008  . DYSLIPIDEMIA 04/26/2009  . GOUT 10/24/2010  . HYPERTENSION 07/21/2007  . PULMONARY EMBOLISM 05/03/2009  . DEEP VENOUS THROMBOPHLEBITIS, LEG, LEFT 05/05/2009  . ALLERGIC RHINITIS 04/12/2009  . UNSPECIFIED URINARY CALCULUS 07/04/2010  . 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 ECZEMA 05/23/2010  . Long term (current) use of anticoagulants 04/03/2011  . BACK PAIN, LUMBAR 01/14/2011  . 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 Hyperglycemia   . Leukopenia   . Atrial fibrillation   . GERD (gastroesophageal reflux disease)     Assessment: 47 y.o. M on chronic warfarin PTA for hx PE/DVT/Afib and admitted with a therapeutic INR (INR 2.08, goal of 2-3). PTA the patient was known to be taking 7.5 mg daily -- last dose on 1/25. Hgb/Hct/Plt trending up. No overt s/sx of bleeding noted. Will continue home dose for now.   Goal of Therapy:  INR 2-3   Plan:  1. Warfarin 7.5 mg x 1 dose at 1800 today 2. Daily PT/INR 3. Will continue to monitor for any signs/symptoms of bleeding and will follow up with PT/INR in the a.m.   Georgina Pillion, PharmD, BCPS Clinical Pharmacist Pager:  [phone] 01/23/2013 11:33 AM

## 2013-01-23 NOTE — ED Provider Notes (Addendum)
History     CSN: [id number]  Arrival date & time 01/22/13  2339   First MD Initiated Contact with Patient 01/23/13 0003      Chief Complaint  Patient presents with  . Loss of Consciousness    Patient is a 47 y.o. male presenting with syncope. The history is provided by the patient and a significant other.  Loss of Consciousness This is a new problem. Episode onset: just prior to arrival. The problem has been gradually improving. Pertinent negatives include no chest pain, no headaches and no shortness of breath. Nothing aggravates the symptoms. Nothing relieves the symptoms. Treatments tried: rest. The treatment provided moderate relief.  pt reports he took half tablet of cialis earlier.  About an hr later he had syncopal episode.  No injuries reported from the event.  He denies HA/CP/abd pain.  No neck pain is reported.  No focal weakness.  He had otherwise been at his baseline prior to the event.  Past Medical History  Diagnosis Date  . DM 08/08/2008  . DYSLIPIDEMIA 04/26/2009  . GOUT 10/24/2010  . HYPERTENSION 07/21/2007  . PULMONARY EMBOLISM 05/03/2009  . DEEP VENOUS THROMBOPHLEBITIS, LEG, LEFT 05/05/2009  . ALLERGIC RHINITIS 04/12/2009  . UNSPECIFIED URINARY CALCULUS 07/04/2010  . 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 ECZEMA 05/23/2010  . Long term (current) use of anticoagulants 04/03/2011  . BACK PAIN, LUMBAR 01/14/2011  . 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 Hyperglycemia   . Leukopenia   . Atrial fibrillation   . GERD (gastroesophageal reflux disease)     Past Surgical History  Procedure Date  . Anterior cruciate ligament repair 2000    Right  . Knee arthroscopy     left knee  . Knee arthroscopy w/ acl reconstruction     right knee  . Cystectomy     back of head  . Knee arthroscopy 01/03/2013    Procedure: ARTHROSCOPY KNEE;  Surgeon: Thera Flake., MD;  Location: [facility] OR;  Service: Orthopedics;  Laterality: Left;  Lavage Synovectomy, Removal of loose body    Family History    Problem Relation Age of Onset  . Heart disease Neg Hx     no premature CAD known  . Diabetes Mother   . Hypertension Mother   . Hyperlipidemia Mother   . Diabetes Father   . Hypertension Father   . Hyperlipidemia Father     History  Substance Use Topics  . Smoking status: Former Smoker    Quit date: 01/07/2005  . Smokeless tobacco: Never Used  . Alcohol Use: Yes     Comment: Occ.      Review of Systems  Constitutional: Negative for fever.  Respiratory: Negative for shortness of breath.   Cardiovascular: Positive for syncope. Negative for chest pain.  Neurological: Negative for headaches.  Psychiatric/Behavioral: Negative for agitation.  All other systems reviewed and are negative.    Allergies  Metformin  Home Medications   Current Outpatient Rx  Name  Route  Sig  Dispense  Refill  . ALLOPURINOL 300 MG PO TABS   Oral   Take 1 tablet (300 mg total) by mouth as needed. For gout flare-up.   30 tablet   11   . ATORVASTATIN CALCIUM 20 MG PO TABS   Oral   Take 1 tablet (20 mg total) by mouth daily.   90 tablet   3   . CEFTRIAXONE  1 GM IVPB MIXTURE   Intravenous   Inject 1 g into the vein daily.  30 Units   0     Pt needs 30 day supply, 1g ceftriaxone IVPB mixtur ...   . FUROSEMIDE 40 MG PO TABS   Oral   Take 1 tablet (40 mg total) by mouth daily.   30 tablet   5   . HALOBETASOL PROPIONATE 0.05 % EX CREA      APPLY  CREAM TOPICALLY THREE TIMES DAILY AS NEEDED FOR RASH   50 g   2   . METOPROLOL TARTRATE 25 MG PO TABS   Oral   Take 1 tablet (25 mg total) by mouth 2 (two) times daily.   60 tablet   0   . OLMESARTAN MEDOXOMIL 40 MG PO TABS   Oral   Take 40 mg by mouth daily.           . OXYCODONE-ACETAMINOPHEN 5-325 MG PO TABS      1-2 tabs po q4-6hrs prn pain   90 tablet   0   . SITAGLIPTIN PHOSPHATE 100 MG PO TABS   Oral   Take 1 tablet (100 mg total) by mouth daily.   30 tablet   0     PT NEEDS TO SCHEDULE FOLLOW UP APPT   .  SODIUM CHLORIDE 0.9 % IJ SOLN   Intracatheter   10-40 mLs by Intracatheter route as needed (flush).   5 mL   0   . VANCOMYCIN 1250 MG IVPB   Intravenous   Inject 1,250 mg into the vein every 8 (eight) hours.   250 mL   0   . VANCOMYCIN HCL IN DEXTROSE 1 GM/200ML IV SOLN   Intravenous   Inject 250 mLs (1,250 mg total) into the vein every 8 (eight) hours.   12000 mL   0     rx may be decreased to bid once therapeutic to be  ...   . WARFARIN SODIUM 5 MG PO TABS   Oral   Take 7.5 mg by mouth daily.         Marland Kitchen FEXOFENADINE HCL 180 MG PO TABS   Oral   Take 180 mg by mouth daily as needed. For allergies.         Marland Kitchen FLUTICASONE PROPIONATE 50 MCG/ACT NA SUSP   Nasal   Place 2 sprays into the nose daily as needed. For allergies.           BP 86/46  Pulse 54  Resp 12  SpO2 100%  Physical Exam CONSTITUTIONAL: Well developed/well nourished HEAD AND FACE: Normocephalic/atraumatic EYES: EOMI/PERRL ENMT: Mucous membranes moist NECK: supple no meningeal signs SPINE:no cervical spine tenderness noted CV: S1/S2 noted, no murmurs/rubs/gallops noted LUNGS: Lungs are clear to auscultation bilaterally, no apparent distress ABDOMEN: soft, nontender, no rebound or guarding GU:no cva tenderness NEURO: Pt is awake/alert, moves all extremitiesx4 EXTREMITIES: pulses normal, full ROM. Left knee without erythema/crepitance SKIN: warm, color normal PSYCH: no abnormalities of mood noted  ED Course  Procedures (including critical care time)  CENTRAL LINE Performed by: Joya Gaskins Consent:Written consent given by wife Required items:special equipment available Patient identity confirmed: arm band and provided demographic data Time out: Immediately prior to procedure a "time out" was called to verify the correct patient, procedure, equipment, support staff and site/side marked as required. Indications: vascular access Anesthesia: local infiltration Local anesthetic: lidocaine  1% with epinephrine Anesthetic total: 3 ml Patient sedated: no Preparation: skin prepped with 2% chlorhexidine Skin prep agent dried: skin prep agent completely dried prior to procedure Sterile barriers: all  five maximum sterile barriers used - cap, mask, sterile gown, sterile gloves, and large sterile sheet Hand hygiene: hand hygiene performed prior to central venous catheter insertion  Location details: right IJ with ultrasound guidance  Catheter type: triple lumen Catheter size: 8 Fr Pre-procedure: landmarks identified Ultrasound guidance: yes Successful placement: yes Post-procedure: line sutured and dressing applied Assessment: blood return through all parts, free fluid flow, placement verified by x-ray and no pneumothorax on x-ray Patient tolerance: Patient tolerated the procedure well with no immediate complications.   CRITICAL CARE Performed by: Joya Gaskins   Total critical care time: 45  Critical care time was exclusive of separately billable procedures and treating other patients.  Critical care was necessary to treat or prevent imminent or life-threatening deterioration.  Critical care was time spent personally by me on the following activities: development of treatment plan with patient and/or surrogate as well as nursing, discussions with consultants, evaluation of patient's response to treatment, examination of patient, obtaining history from patient or surrogate, ordering and performing treatments and interventions, ordering and review of laboratory studies, ordering and review of radiographic studies, pulse oximetry and re-evaluation of patient's condition.   Labs Reviewed  GLUCOSE, CAPILLARY - Abnormal; Notable for the following:    Glucose-Capillary 163 (*)     All other components within normal limits  BASIC METABOLIC PANEL  CBC WITH DIFFERENTIAL  PROTIME-INR   12:56 AM Pt was seen/examined after apparent syncopal event after taking cialis (pt on  multiple cardiac meds) He then proceeded to have seizure (witnessed by nurse) On my arrival to room the seizure was finished and pt was waking up and appeared confused but was following commands His BP had been improving but has since decreased again. Ativan ordered as well as IV fluids Will continue to follow He has no h/o seizures.  Will obtain CT imaging   On review, cialis has long half-life (>17.5hrs)  BP did not respond to IV fluid (4 liters)  Pt with continued hypotension/confusion D/w wife need to place central line and she consented/signed D/w critical care to admit patient for persistent hypotension   He required peripheral levophed due to persistent hypotension while line was being placed.  No complications thus far in his extremities.  His BP is improving Critical care at bedside   MDM  Nursing notes including past medical history and social history reviewed and considered in documentation Labs/vital reviewed and considered Previous records reviewed and considered - recent admission for septic knee xrays reviewed and considered        Date: 01/23/2013  Rate: 56  Rhythm: atrial flutter  QRS Axis: normal  Intervals: normal  ST/T Wave abnormalities: nonspecific ST changes  Conduction Disutrbances:none  Narrative Interpretation:   Old EKG Reviewed: changes noted   Date: 01/23/2013 0116  Rate: 58  Rhythm: atrial flutter  QRS Axis: normal  Intervals: normal  ST/T Wave abnormalities: nonspecific ST changes  Conduction Disutrbances:none  Narrative Interpretation:   Old EKG Reviewed: unchanged    Joya Gaskins, MD 01/23/13 0250  Joya Gaskins, MD 01/23/13 [id number]

## 2013-01-23 NOTE — H&P (Signed)
Name: Spencer English MRN: [id number] DOB: 1966/07/17    LOS: 1  PULMONARY / CRITICAL CARE MEDICINE  HPI:  Spencer English is a 47 year old gentleman with a past medical history significant for atrial fibrillation, diabetes, and hypertension with normal EF, who presents to the ED after a syncopal episode complicated by probable seizure secondary to excessive use of Cialis. Blood pressure on arrival of EMS was 80/60. He was brought to the emergency department a central line has been placed and he was started on norepinephrine after administration of 4 L of crystalloid.  Past Medical History  Diagnosis Date  . DM 08/08/2008  . DYSLIPIDEMIA 04/26/2009  . GOUT 10/24/2010  . HYPERTENSION 07/21/2007  . PULMONARY EMBOLISM 05/03/2009  . DEEP VENOUS THROMBOPHLEBITIS, LEG, LEFT 05/05/2009  . ALLERGIC RHINITIS 04/12/2009  . UNSPECIFIED URINARY CALCULUS 07/04/2010  . 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 ECZEMA 05/23/2010  . Long term (current) use of anticoagulants 04/03/2011  . BACK PAIN, LUMBAR 01/14/2011  . 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 Hyperglycemia   . Leukopenia   . Atrial fibrillation   . GERD (gastroesophageal reflux disease)    Past Surgical History  Procedure Date  . Anterior cruciate ligament repair 2000    Right  . Knee arthroscopy     left knee  . Knee arthroscopy w/ acl reconstruction     right knee  . Cystectomy     back of head  . Knee arthroscopy 01/03/2013    Procedure: ARTHROSCOPY KNEE;  Surgeon: Thera Flake., MD;  Location: [facility] OR;  Service: Orthopedics;  Laterality: Left;  Lavage Synovectomy, Removal of loose body   Prior to Admission medications   Medication Sig Start Date End Date Taking? Authorizing Provider  allopurinol (ZYLOPRIM) 300 MG tablet Take 1 tablet (300 mg total) by mouth as needed. For gout flare-up. 09/06/12  Yes Romero Belling, MD  atorvastatin (LIPITOR) 20 MG tablet Take 1 tablet (20 mg total) by mouth daily. 09/06/12 09/06/13 Yes Romero Belling, MD  dextrose 5 % SOLN 50 mL  with cefTRIAXone 1 G SOLR 1 g Inject 1 g into the vein daily. 01/06/13  Yes Joshua Chadwell, PA-C  furosemide (LASIX) 40 MG tablet Take 1 tablet (40 mg total) by mouth daily. 01/12/12  Yes Romero Belling, MD  halobetasol (ULTRAVATE) 0.05 % cream APPLY  CREAM TOPICALLY THREE TIMES DAILY AS NEEDED FOR RASH 06/24/12  Yes Romero Belling, MD  metoprolol tartrate (LOPRESSOR) 25 MG tablet Take 1 tablet (25 mg total) by mouth 2 (two) times daily. 01/06/13  Yes Joshua Chadwell, PA-C  olmesartan (BENICAR) 40 MG tablet Take 40 mg by mouth daily.     Yes Historical Provider, MD  oxyCODONE-acetaminophen (ROXICET) 5-325 MG per tablet 1-2 tabs po q4-6hrs prn pain 01/06/13  Yes Joshua Chadwell, PA-C  sitaGLIPtin (JANUVIA) 100 MG tablet Take 1 tablet (100 mg total) by mouth daily. 12/20/12  Yes Carlus Pavlov, MD  sodium chloride 0.9 % injection 10-40 mLs by Intracatheter route as needed (flush). 01/07/13  Yes W D Carloyn Manner., MD  sodium chloride 0.9 % SOLN 250 mL with vancomycin 10 G SOLR 1,250 mg Inject 1,250 mg into the vein every 8 (eight) hours. 01/07/13  Yes W D Carloyn Manner., MD  vancomycin (VANCOCIN) 1 GM/200ML SOLN Inject 250 mLs (1,250 mg total) into the vein every 8 (eight) hours. 01/07/13  Yes Eulas Post, MD  warfarin (COUMADIN) 5 MG tablet Take 7.5 mg by mouth daily.   Yes Historical Provider, MD  fexofenadine (  ALLEGRA) 180 MG tablet Take 180 mg by mouth daily as needed. For allergies.    Historical Provider, MD  fluticasone (FLONASE) 50 MCG/ACT nasal spray Place 2 sprays into the nose daily as needed. For allergies.    Historical Provider, MD   Allergies Allergies  Allergen Reactions  . Metformin     REACTION: nausea/diarrhea    Family History Family History  Problem Relation Age of Onset  . Heart disease Neg Hx     no premature CAD known  . Diabetes Mother   . Hypertension Mother   . Hyperlipidemia Mother   . Diabetes Father   . Hypertension Father   . Hyperlipidemia Father    Social History   reports that he quit smoking about 8 years ago. He has never used smokeless tobacco. He reports that he drinks alcohol. He reports that he does not use illicit drugs.  Review Of Systems:  All other systems reviewed were negative except as per history of present illness.  Vital Signs: Temp:  [99.8 F (37.7 C)] 99.8 F (37.7 C) (01/26 0117) Pulse Rate:  [53-157] 73  (01/26 0240) Resp:  [12-23] 20  (01/26 0240) BP: (70-90)/(40-59) 81/48 mmHg (01/26 0240) SpO2:  [96 %-100 %] 96 % (01/26 0230)  Physical Examination: General:  No acute distress Neuro:  Drowsy, but arowsable. nonfocal HEENT:  PERRL, pink conjunctivae, moist membranes Neck:  Supple, no JVD   Cardiovascular:  RRR, no M/R/G Lungs:  Clear to auscultation bilaterally, no W/R/R Abdomen:  Soft, nontender, nondistended, bowel sounds present Musculoskeletal:  Moves all extremities, no pedal edema Skin:  No rash  Principal Problem:  *Hypotension due to drugs Active Problems:  DM  Encounter for long-term (current) use of anticoagulants  Atrial fibrillation  History of septic arthritis   ASSESSMENT AND PLAN  PULMONARY No results found for this basename: PHART:5,PCO2:5,PCO2ART:5,PO2ART:5,HCO3:5,O2SAT:5 in the last 168 hours Ventilator Settings:    CARDIOVASCULAR  Lab 01/23/13 0234  TROPONINI --  LATICACIDVEN 2.43*  PROBNP --   ECG:  A. fib with controlled rate Lines:  Right PICC line 1/10 >>> Right IJ CVC 1/26 >>>  A: Medication-induced hypotension P:  Has had reasonable fluid resuscitation will continue on norepinephrine, half-life of Cialis ranges from 15-35 hours, so may require pressors for anywhere from 24-48 hours.  RENAL  Lab 01/23/13 0055  NA 138  K 3.7  CL 105  CO2 18*  BUN 15  CREATININE 1.45*  CALCIUM 8.5  MG --  PHOS --   Intake/Output    None    Foley:  None  A:  Mild acute kidney injury P:   Will monitor urine output, it is likely secondary to hypotension.  HEMATOLOGIC  Lab  01/23/13 0055  HGB 10.4*  HCT 33.9*  PLT 228  INR 2.08*  APTT --   A:  Therapeutic anticoagulation for atrial fibrillation, history of DVT and PE P:  Continue warfarin at current dose.  INFECTIOUS  Lab 01/23/13 0055  WBC 7.5  PROCALCITON --   Cultures: Cultures from prior hospitalization all negative to date. Cultures from today currently in lab. Antibiotics: Will continue outpatient vancomycin and ceftriaxone. Will complete 30 days in early February.  A:  Recent septic arthritis P:   He does not appear his in this patient presentation that he is currently in worsening septic shock. Will not change antibiotics unless cultures returned with different bacteria, or patient clinically worsens.  NEUROLOGIC  A:  Possible seizure, altered level of consciousness P:  For now will monitor mental status, hopefully this improves with improved perfusion.  BEST PRACTICE / DISPOSITION - Level of Care:  ICU - Primary Service:  PCCM - Consultants:  None - Code Status:  Full - Diet:  N.p.o. for now - DVT Px:  Fully anticoagulated - GI Px:  Not indicated - Skin Integrity:  Good - Social / Family:  Discussed with family at bedside  The patient is critically ill with multiple organ systems failure and requires high complexity decision making for assessment and support, frequent evaluation and titration of therapies, application of advanced monitoring technologies and extensive interpretation of multiple databases.   Critical Care Time devoted to patient care services described in this note is: 88 Minutes  Lavella Hammock, M.D. Pulmonary and Critical Care Medicine [facility] Pager: [phone]  01/23/2013, 3:00 AM

## 2013-01-23 NOTE — Progress Notes (Signed)
E-Link notified of elevated B/P after weaning from pressors. Will continue to monitor carefully.

## 2013-01-23 NOTE — Progress Notes (Signed)
Name: Spencer English MRN: [id number] DOB: 20-May-1966 LOS: 1  PCCM RESIDENT DAILY PROGRESS NOTE  History of Present Illness: Spencer English is a 47 year old English with a past medical history significant for atrial fibrillation, diabetes, and hypertension with normal EF, who presents to the ED after a syncopal episode complicated by probable seizure secondary to excessive use of Cialis. Blood pressure on arrival of EMS was 80/60. Spencer English was brought to the emergency department a central line has been placed and Spencer English was started on norepinephrine after administration of 4 L of crystalloid.  Lines / Drains: Right Brach PICC:  PIV x2:   Cultures: 1/26 BCx2:   Antibiotics: 1/6 Rocephin:  1/6 Vanc:   Tests / Events: 1/26 admitted overnight for hypotension likely secondary to Cialis.  Recently hospitalized for septic left knee.  On Vanc and Rocephin since 1/6 for septic knee.   Overnight Events:   Vital Signs: Temp:  [97.6 F (36.4 C)-99.8 F (37.7 C)] 97.6 F (36.4 C) (01/26 0400) Pulse Rate:  [47-157] 49  (01/26 0615) Resp:  [12-24] 16  (01/26 0615) BP: (70-122)/(40-76) 122/76 mmHg (01/26 0615) SpO2:  [96 %-100 %] 99 % (01/26 0615) Weight:  [246 lb 7.6 oz (111.8 kg)] 246 lb 7.6 oz (111.8 kg) (01/26 0400) I/O last 3 completed shifts: In: 289 [I.V.:75; IV Piggyback:214] Out: 1 [Stool:1]  Physical Examination: Vitals reviewed. General: resting in bed, NAD HEENT: PERRL, EOMI, no scleral icterus Cardiac: irregularly irregular rate and rhythm, no rubs, murmurs or gallops Pulm: clear to auscultation bilaterally, no wheezes, rales, or rhonchi Abd: soft, nontender, nondistended, BS present Ext: warm and well perfused, no pedal edema, recent surgical port scars on the left knee with mild swelling.   Neuro: alert and oriented X3, cranial nerves II-XII grossly intact, strength and sensation to light touch equal in bilateral upper and lower extremities  Labs and Imaging:   Basic Metabolic  Panel:  Lab 01/23/13 0500 01/23/13 0055  NA 137 138  K 5.2* 3.7  CL 106 105  CO2 20 18*  GLUCOSE 231* 179*  BUN 17 15  CREATININE 1.98* 1.45*  CALCIUM 8.1* 8.5  MG 1.5 --  PHOS 4.9* --   CBC:  Lab 01/23/13 0500 01/23/13 0055  WBC 11.3* 7.5  NEUTROABS -- 5.0  HGB 11.1* 10.4*  HCT 35.4* 33.9*  MCV 75.8* 77.2*  PLT 214 228   CBG:  Lab 01/23/13 0350 01/23/13 0051  GLUCAP 215* 163*   Coagulation:  Lab 01/23/13 0055  LABPROT 22.5*  INR 2.08*    Lab 01/23/13 0234  LATICACIDVEN 2.43*  PROCALCITON --    Assessment and Plan: PULMONARY  ASSESSMENT: no active issues PLAN:   Monitor off oxygen.  CARDIOVASCULAR  ASSESSMENT:  1. Hypotension:  Likely secondary to medication. Half-life of Cialis ranges from 15-35 hours. Currently on Levophed with decreasing needs.  Work to wean it down.  2. Atrial flutter/fib: History of A. Fib vs flutter.  Currently appears to be a 5:1 block on the monitor with controlled rate.  On coumadin and plan for ablation in February.  Followed by Dr. [place] from [employer].   PLAN:  Continue levophed, titrate to MAP of 65.  Continue coumadin per pharmacy. Hold home antihypertensives (Lasix, Benicar, Metoprolol) low threshold to restart metoprolol if develops RVR.  RENAL  ASSESSMENT:   1. Acute kidney injury:  Low urine output and rising creatinine likely secondary to hypotension induced ATN.  Also possible some component of AIN secondary to vanc as well though  less likely given Vanc levels are therapeutic    2. Hyperkalemia:  Likely secondary to acute renal failure.  UOP not great overnight so will check residuals.  If not making much urine may need to give Kayexalate.   PLAN:   Bladder scan today Send urine lytes if able to urinate UA Bmet at 1600 If not making urine will give one dose of Kayexalate and follow.   GASTROINTESTINAL  ASSESSMENT:  No active issues PLAN:   Continue Carb mod diet  HEMATOLOGIC  ASSESSMENT:   1. On  coumadin with history of PE and DVT:  On coumadin for this and a fib.  INR therapeutic  PLAN:  Continue coumadin per pharm  INFECTIOUS  ASSESSMENT:   1. Recent septic arthritis in the left knee:  Currently does not appear septic, knee looks stable.  Has PICC line for abx.  Will continue current ABX per ID from his last admission.  Currently on day 21 of ABX.   PLAN:   Continue Vanc and Rocephin.  Need to monitor Vanc with new AKI.   ENDOCRINE  ASSESSMENT:   1. Diabetes:  On Januvia at home.  On hospital formulary replacement currently.   Last A1C was 6.8 so well controlled at home.   PLAN:   Add SSI and ACHS checks.    NEUROLOGIC  ASSESSMENT:   1. Possible seizure: AMS on admission and possible seizure in the ED.  No further seizure activity.  Likely related to hypotension.   PLAN:   Seizure precautions  CLINICAL SUMMARY:  47 yr old man with history of a. Fib, diabetes, and hypertension who presented after a syncopal event with possible seizure secondary to Cialis use.  Spencer English underwent appropriate volume resuscitation and had continued hypotension.  Started on Levophed and able to wean down off it overnight.  Will likely need as home antihypertensives and cialis wear off.  Spencer English does also have some new AKI which may be secondary to ATN from hypotension vs AIN from vanc, more likely ATN.    Best practices / Disposition: -->ICU status under PCCM -->full code -->Coumadin per pharm -->diet Carb Mod.  -->family updated at bedside  PRIBULA,CHRISTOPHER, MD Internal Medicine Resident, PGY III Co-Chief Resident, Internal Medicine Pager: [phone] 01/23/2013 8:08 AM   The patient is critically ill with multiple organ systems failure and requires high complexity decision making for assessment and support, frequent evaluation and titration of therapies, application of advanced monitoring technologies and extensive interpretation of multiple databases. Critical Care Time devoted to patient  care services described in this note is 35 minutes.  Patient seen and examined, agree with above note.  I dictated the care and orders written for this patient under my direction.  Alyson Reedy, MD [phone]

## 2013-01-23 NOTE — ED Notes (Signed)
MD at bedside. 

## 2013-01-23 NOTE — ED Notes (Signed)
Family at bedside. 

## 2013-01-24 DIAGNOSIS — R55 Syncope and collapse: Secondary | ICD-10-CM

## 2013-01-24 DIAGNOSIS — I82409 Acute embolism and thrombosis of unspecified deep veins of unspecified lower extremity: Secondary | ICD-10-CM

## 2013-01-24 DIAGNOSIS — R079 Chest pain, unspecified: Secondary | ICD-10-CM

## 2013-01-24 DIAGNOSIS — J309 Allergic rhinitis, unspecified: Secondary | ICD-10-CM

## 2013-01-24 DIAGNOSIS — I059 Rheumatic mitral valve disease, unspecified: Secondary | ICD-10-CM

## 2013-01-24 DIAGNOSIS — I4891 Unspecified atrial fibrillation: Secondary | ICD-10-CM

## 2013-01-24 DIAGNOSIS — R579 Shock, unspecified: Secondary | ICD-10-CM

## 2013-01-24 LAB — MAGNESIUM: Magnesium: 1.9 mg/dL (ref 1.5–2.5)

## 2013-01-24 LAB — PROTIME-INR: INR: 3.12 — ABNORMAL HIGH (ref 0.00–1.49)

## 2013-01-24 LAB — BASIC METABOLIC PANEL
CO2: 20 mEq/L (ref 19–32)
Chloride: 105 mEq/L (ref 96–112)
GFR calc Af Amer: 26 mL/min — ABNORMAL LOW (ref >90)
Sodium: 138 mEq/L (ref 135–145)

## 2013-01-24 LAB — CBC
Platelets: 146 10*3/uL — ABNORMAL LOW (ref 150–400)
RBC: 4.25 MIL/uL (ref 4.22–5.81)
WBC: 8 10*3/uL (ref 4.0–10.5)

## 2013-01-24 LAB — GLUCOSE, CAPILLARY
Glucose-Capillary: 108 mg/dL — ABNORMAL HIGH (ref 70–99)
Glucose-Capillary: 125 mg/dL — ABNORMAL HIGH (ref 70–99)

## 2013-01-24 LAB — PHOSPHORUS: Phosphorus: 5.1 mg/dL — ABNORMAL HIGH (ref 2.3–4.6)

## 2013-01-24 MED ORDER — SODIUM CHLORIDE 0.9 % IV SOLN
INTRAVENOUS | Status: AC
  Administered 2013-01-24: via INTRAVENOUS

## 2013-01-24 MED ORDER — SODIUM CHLORIDE 0.9 % IJ SOLN
10.0000 mL | INTRAMUSCULAR | Status: DC | PRN
  Administered 2013-01-24: 30 mL
  Administered 2013-01-25: 10 mL

## 2013-01-24 MED ORDER — METOPROLOL TARTRATE 25 MG PO TABS
25.0000 mg | ORAL_TABLET | Freq: Two times a day (BID) | ORAL | Status: DC
  Administered 2013-01-24 – 2013-01-27 (×7): 25 mg via ORAL
  Filled 2013-01-24 (×8): qty 1

## 2013-01-24 MED ORDER — VANCOMYCIN HCL 10 G IV SOLR
1500.0000 mg | INTRAVENOUS | Status: DC
  Administered 2013-01-25 – 2013-01-26 (×2): 1500 mg via INTRAVENOUS
  Filled 2013-01-24 (×3): qty 1500

## 2013-01-24 MED ORDER — METOPROLOL TARTRATE 25 MG PO TABS: 25.0000 mg | ORAL_TABLET | Freq: Two times a day (BID) | ORAL | Status: DC

## 2013-01-24 MED ORDER — VERAPAMIL HCL 80 MG PO TABS
80.0000 mg | ORAL_TABLET | Freq: Three times a day (TID) | ORAL | Status: DC
  Administered 2013-01-24 – 2013-01-27 (×9): 80 mg via ORAL
  Filled 2013-01-24 (×12): qty 1

## 2013-01-24 MED ORDER — GLUCERNA SHAKE PO LIQD
237.0000 mL | Freq: Three times a day (TID) | ORAL | Status: DC
  Administered 2013-01-24: 237 mL via ORAL

## 2013-01-24 MED ORDER — WARFARIN SODIUM 3 MG PO TABS
3.0000 mg | ORAL_TABLET | Freq: Once | ORAL | Status: DC
  Filled 2013-01-24: qty 1

## 2013-01-24 MED ORDER — METOPROLOL TARTRATE 1 MG/ML IV SOLN: 5.0000 mg | Freq: Four times a day (QID) | INTRAVENOUS | Status: DC | PRN

## 2013-01-24 NOTE — Progress Notes (Addendum)
ANTICOAGULATION and Antibiotic CONSULT NOTE - F/U Consult  Pharmacy Consult for Warfarin, Vancomycin Indication: Hx PE/DVT/Afib, HX Septic Joint  Allergies  Allergen Reactions  . Metformin     REACTION: nausea/diarrhea    Patient Measurements: Height: 6' (182.9 cm) Weight: 254 lb 3.1 oz (115.3 kg) IBW/kg (Calculated) : 77.6   Vital Signs: Temp: 97.8 F (36.6 C) (01/27 0410) Temp src: Oral (01/27 0410) BP: 154/72 mmHg (01/27 0600) Pulse Rate: 128  (01/27 0600)  Labs:  Basename 01/24/13 0411 01/24/13 0400 01/23/13 1600 01/23/13 0500 01/23/13 0055  HGB -- 10.1* -- 11.1* --  HCT -- 31.3* -- 35.4* 33.9*  PLT -- 146* -- 214 228  APTT -- -- -- -- --  LABPROT -- 30.4* -- -- 22.5*  INR -- 3.12* -- -- 2.08*  HEPARINUNFRC -- -- -- -- --  CREATININE -- 3.12* 2.79* 1.98* --  CKTOTAL -- -- -- -- --  CKMB -- -- -- -- --  TROPONINI <0.30 -- -- -- --    Estimated Creatinine Clearance: 38.4 ml/min (by C-G formula based on Cr of 3.12).   Medical History: Past Medical History  Diagnosis Date  . DM 08/08/2008  . DYSLIPIDEMIA 04/26/2009  . GOUT 10/24/2010  . HYPERTENSION 07/21/2007  . PULMONARY EMBOLISM 05/03/2009  . DEEP VENOUS THROMBOPHLEBITIS, LEG, LEFT 05/05/2009  . ALLERGIC RHINITIS 04/12/2009  . UNSPECIFIED URINARY CALCULUS 07/04/2010  . 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 ECZEMA 05/23/2010  . Long term (current) use of anticoagulants 04/03/2011  . BACK PAIN, LUMBAR 01/14/2011  . 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 Hyperglycemia   . Leukopenia   . Atrial fibrillation   . GERD (gastroesophageal reflux disease)     Assessment: 47 y.o. M on chronic warfarin PTA for hx PE/DVT/Afib and admitted with a therapeutic INR (INR 2.08, goal of 2-3). INR has now jumped to 3.12 overnight after a 7.5mg  dose. PTA the patient was known to be taking 7.5 mg daily. This INR jump may be due to the dose prior to admission--INR may still trend up tomorrow. Hgb/Hct/Plt trending down. No overt s/sx of  bleeding noted.   Patient is also to continue on Vancomycin per RX and ceftriaxone per MD for hx of septic joint. Scr continues to rise (3.12 today, baseline 0.72 earlier this month). WBC wnl, afebrile. Blood cultures are no growth to date. Random vanc level on 1/26 was ~19mcg/ml (estimated true trough ~63mcg/nl). Will adjust dose due to worsening renal function   Vanc 1/7 (last admit) >> (2/5)  Rocephin 1/7 >> (2/5) Cx: 1/26 BCx>>NGTD  Goal of Therapy:  INR 2-3 Vancomycin trough 15-20 mcg/ml   Plan:  1. No Coumadin tonight  2. Daily PT/INR 3. Will continue to monitor for any signs/symptoms of bleeding and will follow up with PT/INR in the a.m. 4. Change Vancomycin to 1500mg  IV q24h 5. F/U trough at steady state    Thank you, [employer], Pharm.D. Clinical Pharmacist   Pager: [phone] 01/24/2013 7:28 AM

## 2013-01-24 NOTE — Progress Notes (Signed)
INITIAL NUTRITION ASSESSMENT  DOCUMENTATION CODES Per approved criteria  -Obesity Unspecified   INTERVENTION:  Glucerna Shake PO TID, each supplement provides 220 kcal and 10 grams of protein.  NUTRITION DIAGNOSIS: Inadequate oral intake related to decreased appetite as evidenced by poor intake of meals.   Goal: Intake to meet >90% of estimated nutrition needs.  Monitor:  PO intake, supplement tolerance, labs, weight trend.  Reason for Assessment: MST=3  47 y.o. male  Admitting Dx: Hypotension due to drugs  ASSESSMENT: Patient presented to the ED after a syncopal episode complicated by probable seizure secondary to excessive use of Cialis.  Patient remains in the ICU.  Patient c/o poor appetite since receiving IV antibiotics at home for the past several weeks.  Has lost ~10 lbs in the same time frame per patient and his wife.  Patient reports that he is lactose intolerant.  Agree to try Glucerna Shake TID between meals since intake has been so poor.  Glucerna Shake is lactose free.  Patient is at nutrition risk given recent poor PO intake and reported weight loss.  Height: Ht Readings from Last 1 Encounters:  01/23/13 6' (1.829 m)    Weight: Wt Readings from Last 1 Encounters:  01/24/13 254 lb 3.1 oz (115.3 kg)    Ideal Body Weight: 80.9 kg  % Ideal Body Weight: 143%  Wt Readings from Last 10 Encounters:  01/24/13 254 lb 3.1 oz (115.3 kg)  01/03/13 253 lb (114.76 kg)  01/03/13 253 lb (114.76 kg)  12/20/12 253 lb (114.76 kg)  12/02/12 259 lb (117.482 kg)  11/12/12 259 lb 6.4 oz (117.663 kg)  09/19/12 264 lb (119.75 kg)  09/06/12 266 lb 6 oz (120.827 kg)  10/09/11 252 lb 6 oz (114.477 kg)  09/30/11 254 lb 12.8 oz (115.577 kg)    Usual Body Weight: 264 lb  % Usual Body Weight: 96%  BMI:  Body mass index is 34.47 kg/(m^2). obesity, class 1  Estimated Nutritional Needs: Kcal: 2400 Protein: 120-140 gm Fluid: 2.4 L  Skin: no problems noted  Diet Order:  Carb Control  EDUCATION NEEDS: -Education needs addressed--discussed ways to increase protein and calorie intake with PO supplements.   Intake/Output Summary (Last 24 hours) at 01/24/13 1129 Last data filed at 01/24/13 1000  Gross per 24 hour  Intake   3555 ml  Output   2962 ml  Net    593 ml    Last BM: 1/27   Labs:   Lab 01/24/13 0400 01/23/13 1600 01/23/13 0500  NA 138 138 137  K 3.8 3.9 5.2*  CL 105 105 106  CO2 20 20 20   BUN 27* 25* 17  CREATININE 3.12* 2.79* 1.98*  CALCIUM 8.2* 8.2* 8.1*  MG 1.9 -- 1.5  PHOS 5.1* -- 4.9*  GLUCOSE 119* 108* 231*    CBG (last 3)   Basename 01/24/13 0731 01/23/13 2157 01/23/13 1911  GLUCAP 108* 89 78    Scheduled Meds:   . atorvastatin  20 mg Oral q1800  . cefTRIAXone (ROCEPHIN) IVPB 1 gram/50 mL D5W  1 g Intravenous Q24H  . insulin aspart  0-9 Units Subcutaneous TID WC  . linagliptin  5 mg Oral Daily  . loratadine  10 mg Oral Daily  . metoprolol tartrate  25 mg Oral BID  . vancomycin  1,250 mg Intravenous Q12H  . warfarin  3 mg Oral ONCE-1800  . Warfarin - Physician Dosing Inpatient   Does not apply q1800    Continuous Infusions:   .  sodium chloride 75 mL/hr at 01/24/13 0000    Past Medical History  Diagnosis Date  . DM 08/08/2008  . DYSLIPIDEMIA 04/26/2009  . GOUT 10/24/2010  . HYPERTENSION 07/21/2007  . PULMONARY EMBOLISM 05/03/2009  . DEEP VENOUS THROMBOPHLEBITIS, LEG, LEFT 05/05/2009  . ALLERGIC RHINITIS 04/12/2009  . UNSPECIFIED URINARY CALCULUS 07/04/2010  . 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 ECZEMA 05/23/2010  . Long term (current) use of anticoagulants 04/03/2011  . BACK PAIN, LUMBAR 01/14/2011  . 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 Hyperglycemia   . Leukopenia   . Atrial fibrillation   . GERD (gastroesophageal reflux disease)     Past Surgical History  Procedure Date  . Anterior cruciate ligament repair 2000    Right  . Knee arthroscopy     left knee  . Knee arthroscopy w/ acl reconstruction     right  knee  . Cystectomy     back of head  . Knee arthroscopy 01/03/2013    Procedure: ARTHROSCOPY KNEE;  Surgeon: Thera Flake., MD;  Location: [facility] OR;  Service: Orthopedics;  Laterality: Left;  Lavage Synovectomy, Removal of loose body    Joaquin Courts, RD, LDN, CNSC Pager# [phone] After Hours Pager# [phone]

## 2013-01-24 NOTE — Progress Notes (Signed)
[facility]  Patient Status: Active (receiving services up to time of hospitalization)  [facility] is providing the following services: RN - IV antibiotics  If patient discharges after hours, please call [phone].   Jodene Nam 01/24/2013, 9:58 AM

## 2013-01-24 NOTE — Progress Notes (Signed)
  Echocardiogram 2D Echocardiogram has been performed.  ALUCARD, FEARNOW 01/24/2013, 5:54 PM

## 2013-01-24 NOTE — Progress Notes (Signed)
CRITICAL CARE RESIDENT NOTE Interim Progress Note   Subjective:    I was called overnight by the RN for evaluation of Afib with RVR . At time of evaluation, pt asleep    Objective:    BP 139/68  Pulse 124  Temp 97.8 F (36.6 C) (Oral)  Resp 28  Ht 6' (1.829 m)  Wt 254 lb 3.1 oz (115.3 kg)  BMI 34.47 kg/m2  SpO2 98%   Labs: Basic Metabolic Panel:    Component Value Date/Time   NA 138 01/24/2013 0400   K 3.8 01/24/2013 0400   CL 105 01/24/2013 0400   CO2 20 01/24/2013 0400   BUN 27* 01/24/2013 0400   CREATININE 3.12* 01/24/2013 0400   GLUCOSE 119* 01/24/2013 0400   GLUCOSE 112* 10/30/2006 1637   CALCIUM 8.2* 01/24/2013 0400    CBC:    Component Value Date/Time   WBC 8.0 01/24/2013 0400   HGB 10.1* 01/24/2013 0400   HCT 31.3* 01/24/2013 0400   PLT 146* 01/24/2013 0400   MCV 73.6* 01/24/2013 0400   NEUTROABS 5.0 01/23/2013 0055   LYMPHSABS 1.6 01/23/2013 0055   MONOABS 0.8 01/23/2013 0055   EOSABS 0.2 01/23/2013 0055   BASOSABS 0.0 01/23/2013 0055    Cardiac Enzymes: Lab Results  Component Value Date   CKTOTAL 325* 04/27/2009   CKMB 1.5 04/27/2009   TROPONINI <0.30 01/24/2013     Assessment/ Plan:    History of Afib now  with YNW:GNFAOZH had similar episodes last night. Dr Redmond School advised to monitor. Patient had another episode this am. Labs reviewed . Worsening renal function notable. Platelets dropped from 214> 146 .   Patient is off Levo. BP with a MAPof 90s. I will restart home dose Metoprolol 25 mg bid.  Signed: Almyra Deforest, MD  PGY-III, Internal Medicine Resident  01/24/2013, 6:07 AM

## 2013-01-24 NOTE — Progress Notes (Signed)
Utilization Review Completed.Spencer English T12/06/2013  

## 2013-01-24 NOTE — Progress Notes (Addendum)
Name: ARJUNA DOEDEN MRN: [id number] DOB: Dec 03, 1966 LOS: 2  PCCM RESIDENT DAILY PROGRESS NOTE  History of Present Illness: Mr. Lagace is a 47 year old gentleman with a past medical history significant for atrial fibrillation, diabetes, and hypertension with normal EF, who presents to the ED after a syncopal episode complicated by probable seizure secondary to excessive use of Cialis. Blood pressure on arrival of EMS was 80/60. He was brought to the emergency department a central line has been placed and he was started on norepinephrine after administration of 4 L of crystalloid.  Lines / Drains: Right Brach PICC:  PIV x2:   Cultures: 1/26 BCx2:   Antibiotics: 1/6 Rocephin:  1/6 Vanc:   Tests / Events: 1/26 admitted overnight for hypotension likely secondary to Cialis.  Recently hospitalized for septic left knee.  On Vanc and Rocephin since 1/6 for septic knee.   Overnight Events:  Moved from A. Fib/flutter with a controlled rate to RVR with rates in the 130s.  Afebrile overnight.  Able to wean off pressors yesterday afternoon.  BP actually hypertensive this AM.   Vital Signs: Temp:  [97 F (36.1 C)-99.3 F (37.4 C)] 97.8 F (36.6 C) (01/27 0410) Pulse Rate:  [31-139] 128  (01/27 0600) Resp:  [10-35] 29  (01/27 0600) BP: (80-160)/(39-98) 154/72 mmHg (01/27 0600) SpO2:  [92 %-100 %] 97 % (01/27 0600) Weight:  [254 lb 3.1 oz (115.3 kg)] 254 lb 3.1 oz (115.3 kg) (01/27 0500) I/O last 3 completed shifts: In: 2486.5 [P.O.:920; I.V.:1102.5; IV Piggyback:464] Out: 796 [Urine:795; Stool:1]  Physical Examination: Vitals reviewed. General: resting in bed, NAD HEENT: PERRL, EOMI, no scleral icterus Cardiac: irregularly irregular rate and rhythm, no rubs, murmurs or gallops Pulm: clear to auscultation bilaterally, no wheezes, rales, or rhonchi Abd: soft, nontender, nondistended, BS present Ext: warm and well perfused, no pedal edema, recent surgical port scars on the left knee with mild  swelling.   Neuro: alert and oriented X3, cranial nerves II-XII grossly intact, strength and sensation to light touch equal in bilateral upper and lower extremities  Labs and Imaging:   Basic Metabolic Panel:  Lab 01/24/13 4098 01/23/13 1600 01/23/13 0500  NA 138 138 --  K 3.8 3.9 --  CL 105 105 --  CO2 20 20 --  GLUCOSE 119* 108* --  BUN 27* 25* --  CREATININE 3.12* 2.79* --  CALCIUM 8.2* 8.2* --  MG 1.9 -- 1.5  PHOS 5.1* -- 4.9*   CBC:  Lab 01/24/13 0400 01/23/13 0500 01/23/13 0055  WBC 8.0 11.3* --  NEUTROABS -- -- 5.0  HGB 10.1* 11.1* --  HCT 31.3* 35.4* --  MCV 73.6* 75.8* --  PLT 146* 214 --   CBG:  Lab 01/23/13 2157 01/23/13 1911 01/23/13 1517 01/23/13 1108 01/23/13 0800 01/23/13 0350  GLUCAP 89 78 149* 189* 223* 215*   Coagulation:  Lab 01/24/13 0400 01/23/13 0055  LABPROT 30.4* 22.5*  INR 3.12* 2.08*    Lab 01/23/13 0234  LATICACIDVEN 2.43*  PROCALCITON --   Assessment and Plan: PULMONARY  ASSESSMENT: no active issues PLAN:   Monitor off oxygen. pcxr no edema  CARDIOVASCULAR  ASSESSMENT:  1. Hypotension:  Likely secondary to medication. Half-life of Cialis ranges from 15-35 hours. Able to wean off Levophed yesterday afternoon.  Actually mildly hypertensive this morning.   2. Atrial flutter/fib: History of A. Fib vs flutter.  On admission was in a 5:1 block on the monitor with controlled rate.  Overnight flipped to RVR with rates  in the 130s and was restarted on Metoprolol.  On coumadin and plan for ablation in February.  Followed by Dr. [place] from [place].   3. Hx of grade 1 diastolic dysfunction: Echo in November showed normal EF with grade 1 diastolic dysfunction.    PLAN:  D/C levophed, use phenylephrine in future if  needed Monitor fluid balance with hx of dCHF.  Restart Metoprolol.  May need IV PRN to control rate.  Continue coumadin per pharmacy. Hold home antihypertensives (Lasix, Benicar, Metoprolol) low threshold to restart  metoprolol if develops RVR. Cards consult, leb pt, Christianne Borrow RENAL  ASSESSMENT:   1. Acute kidney injury:  Low urine output and rising creatinine likely secondary to hypotension induced ATN yesterday.  Also possible some component of AIN secondary to vanc as well though less likely given Vanc levels are therapeutic.  UOP picked up yesterday but creatinine is still rising.  FENa yesterday was .22% which pre-renal but he had take his lasix the day prior which will throw off the FENa.   2. Hyperkalemia:  Likely secondary to acute renal failure.  Given one dose of Kayexalate yesterday and K has been good on the last few checks. UA was positive for >300 protein, and granular casts.  PLAN:   Worsening crt Assess cvp, appear sed to need volume yesterday, follow crt trend ATN likely pcxr again in am for volume status May need assess for RAS  GASTROINTESTINAL  ASSESSMENT:  No active issues PLAN:   Continue Carb mod diet  HEMATOLOGIC  ASSESSMENT:   1. On coumadin with history of PE and DVT:  On coumadin for this and a fib.  INR therapeutic  PLAN:  Continue coumadin per pharm INR in am   INFECTIOUS  ASSESSMENT:   1. Recent septic arthritis in the left knee:  Currently does not appear septic, knee looks stable.  Has PICC line for abx.  Will continue current ABX per ID from his last admission.  Currently on day 21 of ABX.   PLAN:   Continue Vanc and Rocephin, re assess extended stop dates from what's planned Pharmacy following Vanc due to renal dysfunction. Echo for veg, with picc home treatment  ENDOCRINE  ASSESSMENT:   1. Diabetes:  On Januvia at home.  On hospital formulary replacement currently.   Last A1C was 6.8 so well controlled at home.   PLAN:   Continue SSI and ACHS checks.   Continue Linagliptin  NEUROLOGIC  ASSESSMENT:   1. Possible seizure: AMS on admission and possible seizure in the ED.  No further seizure activity.  Likely related to hypotension.   PLAN:    Seizure precautions  CLINICAL SUMMARY:  47 yr old man with history of a. Fib, diabetes, and hypertension who presented after a syncopal event with possible seizure secondary to Cialis use.  He underwent appropriate volume resuscitation and had continued hypotension.  Started on Levophed over the day 1/26.  Able to wean to off yesterday afternoon.  AKI likely secondary to ATN from hypotension vs AIN from vanc, more likely ATN.  UOP picking up but creatinine continues to rise.  Likely able to transfer to Tele.  Moved to A. Fib with RVR overnight and was restarted on his home metoprolol.  Likely ATN, move to tele, call cards, follo wtrend crt.  Best practices / Disposition: -->ICU status under PCCM -->full code -->Coumadin per pharm -->diet Carb Mod.  -->family updated at bedside  To tele  PRIBULA,CHRISTOPHER, MD Internal Medicine Resident, PGY III  Co-Chief Resident, Internal Medicine Pager: [phone] 01/24/2013 6:20 AM   Mcarthur Rossetti. Tyson Alias, MD, FACP Pgr: [phone] [facility]

## 2013-01-24 NOTE — Consult Note (Signed)
CARDIOLOGY CONSULT NOTE  Patient ID: Spencer English, MRN: [id number], DOB/AGE: 04-27-1966 47 y.o. Admit date: 01/22/2013 Date of Consult: 01/24/2013  Primary Physician: Romero Belling, MD Primary Cardiologist: Dr. Swaziland  Chief Complaint: Syncope  Reason for Consultation: A.Fib/flutter w/ RVR  HPI: 47 y.o. 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  He was seen in clinic in Nov 2013 by Dr. Swaziland for complaints of tachypalpitations for which he wore a holter monitor that revealed multiple episodes of atrial fibrillation and flutter with rates of 150-160bpm. He followed up on 12/20/12 and was placed on metoprolol (in addition to already prescribed verapamil) with plans to see Dr. Johney Frame on 01/31/13 for consideration of antiarrhythmic therapy vs afib ablation. He was already on chronic coumadin for h/o DVT/PEs. He was hospitalized 1/6 -01/07/13 with an infected left knee (Staph aureus) for which he underwent irrigation and debridement and was discharged home on IV Vanc & Ropcephin via PICC. On the evening of presentation he states he took Cialis (2.5mg ) and then sat in the living room with some friends. After an hour or so he got up to walk to the bathroom and felt dizzy and had "tunnel vision" then passed out. EMS was called. He regained consciousness and was brought to the ED. He denies any chest pain, palpitations, or shortness of breath prior to losing consciousness. He had been feeling well prior to this without recent chest pain, palpitations, shortness of breath, orthopnea, LE edema (other than left knee), PND, abd pain, melena/hematocheazia/hematuria.   On presentation he was started on vasopressors for hypotension (SBP 70-80s). He was noted to have a seizure in the ED that was treated with Ativan and IVF. Head CT was without acute  intracranial findings. EKG showed a.flutter with controlled response. Labs were significant for normal troponin, INR 2.08, Crt 1.45, Hgb 10.4. CXR was without acute cardiopulmonary findings. He was admitted to the ICU and continued on IV abx. Metoprolol, Verapamil, Lasix, and Benicar were held. Coumadin was continued. He was weaned off vasopressors. Overnight he converted to A.fib w/ RVR. He was continued on oral metoprolol. Cardiology is asked to assist in management of A.Fib/Flutter.   Past Medical History  Diagnosis Date  . DM 08/08/2008  . DYSLIPIDEMIA 04/26/2009  . GOUT 10/24/2010  . HYPERTENSION 07/21/2007  . PULMONARY EMBOLISM 05/03/2009  . DEEP VENOUS THROMBOPHLEBITIS, LEG, LEFT 05/05/2009  . ALLERGIC RHINITIS 04/12/2009  . UNSPECIFIED URINARY CALCULUS 07/04/2010  . 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 ECZEMA 05/23/2010  . Long term (current) use of anticoagulants 04/03/2011  . BACK PAIN, LUMBAR 01/14/2011  . 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 Hyperglycemia   . Leukopenia   . Atrial fibrillation   . GERD (gastroesophageal reflux disease)       Surgical History:  Past Surgical History  Procedure Date  . Anterior cruciate ligament repair 2000    Right  . Knee arthroscopy     left knee  . Knee arthroscopy w/ acl reconstruction     right knee  . Cystectomy     back of head  . Knee arthroscopy 01/03/2013    Procedure: ARTHROSCOPY KNEE;  Surgeon: Thera Flake., MD;  Location: [facility] OR;  Service: Orthopedics;  Laterality: Left;  Lavage Synovectomy, Removal of loose body     Home Meds: Medication Sig  allopurinol (ZYLOPRIM) 300 MG tablet Take  1 tablet (300 mg total) by mouth as needed. For gout flare-up.  atorvastatin (LIPITOR) 20 MG tablet Take 1 tablet (20 mg total) by mouth daily.  dextrose 5 % SOLN 50 mL with cefTRIAXone 1 G SOLR 1 g Inject 1 g into the vein daily.  furosemide (LASIX) 40 MG tablet Take 1 tablet (40 mg total) by mouth daily.  halobetasol (ULTRAVATE) 0.05 % cream APPLY   CREAM TOPICALLY THREE TIMES DAILY AS NEEDED FOR RASH  metoprolol tartrate (LOPRESSOR) 25 MG tablet Take 1 tablet (25 mg total) by mouth 2 (two) times daily.  olmesartan (BENICAR) 40 MG tablet Take 40 mg by mouth daily.    oxyCODONE-acetaminophen (ROXICET) 5-325 MG per tablet 1-2 tabs po q4-6hrs prn pain  sitaGLIPtin (JANUVIA) 100 MG tablet Take 1 tablet (100 mg total) by mouth daily.  sodium chloride 0.9 % injection 10-40 mLs by Intracatheter route as needed (flush).  sodium chloride 0.9 % SOLN 250 mL with vancomycin 10 G SOLR 1,250 mg Inject 1,250 mg into the vein every 8 (eight) hours.  vancomycin (VANCOCIN) 1 GM/200ML SOLN Inject 250 mLs (1,250 mg total) into the vein every 8 (eight) hours.  warfarin (COUMADIN) 5 MG tablet Take 7.5 mg by mouth daily.  fexofenadine (ALLEGRA) 180 MG tablet Take 180 mg by mouth daily as needed. For allergies.  fluticasone (FLONASE) 50 MCG/ACT nasal spray Place 2 sprays into the nose daily as needed. For allergies.    Inpatient Medications:   . atorvastatin  20 mg Oral q1800  . cefTRIAXone (ROCEPHIN) IVPB 1 gram/50 mL D5W  1 g Intravenous Q24H  . feeding supplement  237 mL Oral TID BM  . insulin aspart  0-9 Units Subcutaneous TID WC  . linagliptin  5 mg Oral Daily  . loratadine  10 mg Oral Daily  . metoprolol tartrate  25 mg Oral BID  . vancomycin  1,500 mg Intravenous Q24H  . warfarin  3 mg Oral ONCE-1800  . Warfarin - Physician Dosing Inpatient   Does not apply q1800      Allergies:  Allergies  Allergen Reactions  . Metformin     REACTION: nausea/diarrhea    History   Social History  . Marital Status: Legally Separated    Spouse Name: N/A    Number of Children: 3  . 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     Social History Main Topics  . Smoking status: Former Smoker    Quit date: 01/07/2005  . Smokeless tobacco: Never Used  . Alcohol Use: Yes     Comment: Occ.  . Drug Use: No  . Sexually Active:     Other Topics Concern  . Not on file   Social History Narrative  . No narrative on file     Family History  Problem Relation Age of Onset  . Heart disease Neg Hx     no premature CAD known  . Diabetes Mother   . Hypertension Mother   . Hyperlipidemia Mother   . Diabetes Father   . Hypertension Father   . Hyperlipidemia Father      Review of Systems: General: negative for chills, fever, night sweats or weight changes.  Cardiovascular: negative for chest pain, shortness of breath, dyspnea on exertion, edema, orthopnea, palpitations, or paroxysmal nocturnal dyspnea Dermatological: negative for rash Respiratory: negative for cough or wheezing Urologic: negative for hematuria Abdominal: negative for nausea, vomiting, diarrhea, bright red blood per rectum, melena, or  hematemesis Neurologic: As per HPI All other systems reviewed and are otherwise negative except as noted above.  Labs:  [id number] 01/24/13 0411  TROPONINI <0.30   Component Value Date   WBC 8.0 01/24/2013   HGB 10.1* 01/24/2013   HCT 31.3* 01/24/2013   MCV 73.6* 01/24/2013   PLT 146* 01/24/2013    Lab 01/24/13 0400  NA 138  K 3.8  CL 105  CO2 20  BUN 27*  CREATININE 3.12*  CALCIUM 8.2*  GLUCOSE 119*    Radiology/Studies:   01/23/2013 - CT HEAD WITHOUT CONTRAST   Findings: There is no evidence of acute infarction, mass lesion, or intra- or extra-axial hemorrhage on CT.  The posterior fossa, including the cerebellum, brainstem and fourth ventricle, is within normal limits.  The third and lateral ventricles, and basal ganglia are unremarkable in appearance.  The cerebral hemispheres are symmetric in appearance, with normal gray- white differentiation.  No mass effect or midline shift is seen.  There is no evidence of fracture; visualized osseous structures are unremarkable in appearance.  Exophthalmos is noted; the orbits are otherwise grossly unremarkable.  The paranasal sinuses and mastoid air cells are  well-aerated.  Mild scattered soft tissue air along the frontal calvarium may reflect mild soft tissue injury.  IMPRESSION:  1.  No acute intracranial pathology seen on CT. 2.  Mild scattered soft tissue air along the frontal calvarium may reflect mild soft tissue injury. 3.  Exophthalmos noted.     01/23/2013 - PORTABLE CHEST - 1 VIEW   Findings: The right IJ line is noted ending about the proximal to mid SVC.  The right PICC appears to end within the right atrium, though was noted about the cavoatrial junction on prior studies.  The lungs are well-aerated and clear.  There is no evidence of focal opacification, pleural effusion or pneumothorax.  The cardiomediastinal silhouette is borderline enlarged.  No acute osseous abnormalities are seen.  IMPRESSION:  1.  New right IJ line noted ending about the proximal to mid SVC. 2.  The previously placed right PICC appears to end within the right atrium; this should be retracted approximately 3 cm to the cavoatrial junction, when and as deemed clinically appropriate. 3.  No acute cardiopulmonary process seen; borderline cardiomegaly.     01/23/2013 - PORTABLE CHEST - 1 VIEW Findings: Lung expansion is mildly decreased, though still within normal limits.  There is no evidence of focal opacification, pleural effusion or pneumothorax.  The cardiomediastinal silhouette is borderline enlarged.  No acute osseous abnormalities are seen.  The patient's right PICC is noted ending about the distal SVC.  IMPRESSION: No acute cardiopulmonary process seen.  Borderline cardiomegaly noted.      EKG: 01/23/13 A.Flutter 56bpm  01/24/13 A.Fib 113bpm  Physical Exam: Blood pressure 143/92, pulse 101, temperature 97.2 F (36.2 C), temperature source Oral, resp. rate 24, height 6' (1.829 m), weight 254 lb 3.1 oz (115.3 kg), SpO2 95.00%. General: Well developed, black male, in no acute distress. Head: Normocephalic, atraumatic, sclera non-icteric, no xanthomas, nares are without  discharge.  Neck: Right IJ. Supple. Negative for carotid bruits or JVD Lungs: Diminished in the bases without wheezes, rales, or rhonchi. Breathing is unlabored. Heart: Irregularly irregular with S1 S2. No murmurs, rubs, or gallops appreciated. Abdomen: Soft, non-tender, non-distended with normoactive bowel sounds. No hepatomegaly. No rebound/guarding. No obvious abdominal masses. Msk:  Strength and tone appear normal for age. Extremities: Left knee with mild swelling and warm to touch. No clubbing or  cyanosis. Distal pedal pulses are intact and equal bilaterally. Neuro: Alert and oriented X 3. Moves all extremities spontaneously. Psych:  Responds to questions appropriately with a normal affect.   Assessment and Plan:  47 y.o. 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  1. Syncope 2/2 medication induced hypotension 2. Atrial Flutter/Fibrillation w/ RVR  3. Acute Renal Failure 4. Microcytic Anemia 5. Septic Arthritis of Left Knee  6. Hyperkalemia 7. Hypertension 8. H/o DVT/PE on coumadin 9. Hyperlipidemia 10. Diabetes mellitus, Type 2  Signed, HOPE, JESSICA PA-C 01/24/2013, 2:48 PM  As above, patient seen and examined. Briefly he is a 47 year old male with a past medical history of atrial fibrillation/flutter, DVT/pulmonary embolus, hypertension, diabetes mellitus, hyperlipidemia who we are asked to evaluate for atrial fibrillation. Patient was admitted on January 26 after a syncopal episode felt secondary to excessive use of Cialis. Patient states he took 2.5 mg that day. He later developed weakness and dizziness and then suffered loss of consciousness. Patient's course complicated by acute renal insufficiency and he is also noted to have a microcytic anemia. Troponin negative. Electrocardiogram on arrival showed atrial  flutter with controlled ventricular response and RV conduction delay. He subsequently converted to atrial fibrillation. His rate has been elevated and cardiology is asked to evaluate. He denies chest pain, dyspnea, palpitations. Would recommend resuming previous rate controlling medications used as an outpatient. This includes verapamil 240 mg daily and metoprolol 25 mg by mouth twice a day. Continue Coumadin. He is to see Dr. Johney Frame as an outpatient (Feb 3) for further recommendations concerning his atrial arrhythmias. Other issues per primary care. Continue antibiotics for septic knee. CCM ordered echo to R/O vegetation  Olga Millers 3:30 PM

## 2013-01-25 ENCOUNTER — Inpatient Hospital Stay: Payer: BC Managed Care – PPO | Admitting: Internal Medicine

## 2013-01-25 ENCOUNTER — Inpatient Hospital Stay (HOSPITAL_COMMUNITY): Payer: BC Managed Care – PPO

## 2013-01-25 DIAGNOSIS — N179 Acute kidney failure, unspecified: Secondary | ICD-10-CM | POA: Diagnosis present

## 2013-01-25 DIAGNOSIS — D509 Iron deficiency anemia, unspecified: Secondary | ICD-10-CM | POA: Diagnosis present

## 2013-01-25 DIAGNOSIS — D696 Thrombocytopenia, unspecified: Secondary | ICD-10-CM

## 2013-01-25 DIAGNOSIS — R55 Syncope and collapse: Secondary | ICD-10-CM | POA: Diagnosis present

## 2013-01-25 LAB — CBC
MCH: 23.4 pg — ABNORMAL LOW (ref 26.0–34.0)
Platelets: 127 10*3/uL — ABNORMAL LOW (ref 150–400)
RBC: 4.45 MIL/uL (ref 4.22–5.81)
WBC: 7.3 10*3/uL (ref 4.0–10.5)

## 2013-01-25 LAB — BASIC METABOLIC PANEL
Calcium: 8.9 mg/dL (ref 8.4–10.5)
GFR calc Af Amer: 25 mL/min — ABNORMAL LOW (ref >90)
GFR calc non Af Amer: 21 mL/min — ABNORMAL LOW (ref >90)
Glucose, Bld: 128 mg/dL — ABNORMAL HIGH (ref 70–99)
Sodium: 138 mEq/L (ref 135–145)

## 2013-01-25 LAB — GLUCOSE, CAPILLARY
Glucose-Capillary: 147 mg/dL — ABNORMAL HIGH (ref 70–99)
Glucose-Capillary: 159 mg/dL — ABNORMAL HIGH (ref 70–99)

## 2013-01-25 LAB — TSH: TSH: <0.008 u[IU]/mL — ABNORMAL LOW (ref 0.350–4.500)

## 2013-01-25 LAB — PROTIME-INR
INR: 4.23 — ABNORMAL HIGH (ref 0.00–1.49)
Prothrombin Time: 38.1 seconds — ABNORMAL HIGH (ref 11.6–15.2)

## 2013-01-25 MED ORDER — OXYCODONE-ACETAMINOPHEN 5-325 MG PO TABS
1.0000 | ORAL_TABLET | ORAL | Status: DC | PRN
  Administered 2013-01-25 – 2013-01-27 (×4): 1 via ORAL
  Filled 2013-01-25 (×4): qty 1

## 2013-01-25 MED ORDER — TECHNETIUM TC 99M DIETHYLENETRIAME-PENTAACETIC ACID: 40.0000 | Freq: Once | INTRAVENOUS | Status: AC | PRN

## 2013-01-25 MED ORDER — COUMADIN BOOK
Freq: Once | Status: AC
  Administered 2013-01-25: 18:00:00
  Filled 2013-01-25: qty 1

## 2013-01-25 MED ORDER — TECHNETIUM TO 99M ALBUMIN AGGREGATED
6.0000 | Freq: Once | INTRAVENOUS | Status: AC | PRN
  Administered 2013-01-25: 6 via INTRAVENOUS

## 2013-01-25 MED ORDER — WARFARIN - PHARMACIST DOSING INPATIENT: Freq: Every day | Status: DC

## 2013-01-25 NOTE — Progress Notes (Signed)
ANTICOAGULATION CONSULT NOTE - Follow Up Consult  Pharmacy Consult for Coumadin Indication: hx DVT/PE, atrial fibrillation  Allergies  Allergen Reactions  . Metformin     REACTION: nausea/diarrhea    Patient Measurements: Height: 6' (182.9 cm) Weight: 252 lb 14.4 oz (114.715 kg) IBW/kg (Calculated) : 77.6   Vital Signs: Temp: 98.3 F (36.8 C) (01/28 0505) Temp src: Oral (01/28 0505) BP: 138/68 mmHg (01/28 1058) Pulse Rate: 77  (01/28 1058)  Labs:  Basename 01/25/13 0509 01/24/13 0411 01/24/13 0400 01/23/13 1600 01/23/13 0500 01/23/13 0055  HGB 10.4* -- 10.1* -- -- --  HCT 32.3* -- 31.3* -- 35.4* --  PLT 127* -- 146* -- 214 --  APTT -- -- -- -- -- --  LABPROT 38.1* -- 30.4* -- -- 22.5*  INR 4.23* -- 3.12* -- -- 2.08*  HEPARINUNFRC -- -- -- -- -- --  CREATININE 3.22* -- 3.12* 2.79* -- --  CKTOTAL -- -- -- -- -- --  CKMB -- -- -- -- -- --  TROPONINI -- <0.30 -- -- -- --    Estimated Creatinine Clearance: 37.1 ml/min (by C-G formula based on Cr of 3.22).  Assessment: 47 y/o male on chronic Coumadin for hx DVT/PE and Afib. INR is supratherapeutic this morning despite being held yesterday. No bleeding noted, H/H are stable, platelets are decreasing.  Goal of Therapy:  INR 2-3 Monitor platelets by anticoagulation protocol: Yes   Plan:  -No Coumadin tonight -INR daily  [facility], [address].D., BCPS Clinical Pharmacist Pager: [phone] 01/25/2013 11:34 AM

## 2013-01-25 NOTE — Progress Notes (Signed)
Patient reports he feels tightness in abdomen, that started last week.  He states this is why he is not eating much due to the feeling of fullness.  Patient continues to have good urine output.  States his shortness of breath is better.  Will continue to monitor.  Colman Cater

## 2013-01-25 NOTE — Progress Notes (Signed)
Per MD order, central line removed. IV cathter intact. Vaseline pressure gauze to site, pressure held x 5 min, no bleeding to site. Pt instructed not to get out of bed for 30 min after the removal of the central line. Instucted to keep dressing CDI x 24hours, if bleeding occurs hold pressure, if bleeding does not stop contact MD or go to the ED. Pt verbalized understanding and did not have any questions. Evie Crumpler M  

## 2013-01-25 NOTE — Progress Notes (Signed)
CARDIOLOGY CONSULT NOTE  Patient ID: Spencer English, MRN: [id number], DOB/AGE: 1966/11/01 47 y.o. Admit date: 01/22/2013 Date of Consult: 01/25/2013  Primary Physician: Romero Belling, MD Primary Cardiologist: Dr. Swaziland  Chief Complaint: Syncope  Reason for Consultation: A.Fib/flutter w/ RVR  HPI: 47 y.o. 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  Feels much better. BP improved. No further hypotension. Remains in AF/AFL but rate now controlled. No dyspnea. Renal function getting worse but still with good urine output.   Echo reviewed EF 50-55% with mild RV dilation (new from previous)   Past Medical History  Diagnosis Date  . DM 08/08/2008  . DYSLIPIDEMIA 04/26/2009  . GOUT 10/24/2010  . HYPERTENSION 07/21/2007  . PULMONARY EMBOLISM 05/03/2009  . DEEP VENOUS THROMBOPHLEBITIS, LEG, LEFT 05/05/2009  . ALLERGIC RHINITIS 04/12/2009  . UNSPECIFIED URINARY CALCULUS 07/04/2010  . 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 ECZEMA 05/23/2010  . Long term (current) use of anticoagulants 04/03/2011  . BACK PAIN, LUMBAR 01/14/2011  . 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 Hyperglycemia   . Leukopenia   . Atrial fibrillation   . GERD (gastroesophageal reflux disease)       Surgical History:  Past Surgical History  Procedure Date  . Anterior cruciate ligament repair 2000    Right  . Knee arthroscopy     left knee  . Knee arthroscopy w/ acl reconstruction     right knee  . Cystectomy     back of head  . Knee arthroscopy 01/03/2013    Procedure: ARTHROSCOPY KNEE;  Surgeon: Thera Flake., MD;  Location: [facility] OR;  Service: Orthopedics;  Laterality: Left;  Lavage Synovectomy, Removal of loose body     Home Meds: Medication Sig  allopurinol (ZYLOPRIM) 300 MG tablet Take 1 tablet (300 mg total) by mouth as needed.  For gout flare-up.  atorvastatin (LIPITOR) 20 MG tablet Take 1 tablet (20 mg total) by mouth daily.   Inpatient Medications:   . atorvastatin  20 mg Oral q1800  . cefTRIAXone (ROCEPHIN) IVPB 1 gram/50 mL D5W  1 g Intravenous Q24H  . feeding supplement  237 mL Oral TID BM  . insulin aspart  0-9 Units Subcutaneous TID WC  . linagliptin  5 mg Oral Daily  . loratadine  10 mg Oral Daily  . metoprolol tartrate  25 mg Oral BID  . vancomycin  1,500 mg Intravenous Q24H  . warfarin  3 mg Oral ONCE-1800  . Warfarin - Physician Dosing Inpatient   Does not apply q1800      Allergies:  Allergies  Allergen Reactions  . Metformin     REACTION: nausea/diarrhea    History   Social History  . Marital Status: Legally Separated    Spouse Name: N/A    Number of Children: 3  . 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     Social History Main Topics  . Smoking status: Former Smoker    Quit date: 01/07/2005  . Smokeless tobacco: Never Used  . Alcohol Use: Yes     Comment: Occ.  . Drug Use: No  . Sexually Active:    Other Topics Concern  . Not on file   Social History Narrative  . No narrative on file     Labs:  Basename 01/24/13 0411  TROPONINI <0.30   Component Value Date   WBC 8.0 01/24/2013   HGB 10.1* 01/24/2013   HCT 31.3* 01/24/2013   MCV 73.6* 01/24/2013   PLT 146* 01/24/2013    Lab 01/24/13 0400  NA 138  K 3.8  CL 105  CO2 20  BUN 27*  CREATININE 3.12*  CALCIUM 8.2*  GLUCOSE 119*    Radiology/Studies:   01/23/2013 - CT HEAD WITHOUT CONTRAST   Findings: There is no evidence of acute infarction, mass lesion, or intra- or extra-axial hemorrhage on CT.  The posterior fossa, including the cerebellum, brainstem and fourth ventricle, is within normal limits.  The third and lateral ventricles, and basal ganglia are unremarkable in appearance.  The cerebral hemispheres are symmetric in appearance, with normal gray- white differentiation.  No mass  effect or midline shift is seen.  There is no evidence of fracture; visualized osseous structures are unremarkable in appearance.  Exophthalmos is noted; the orbits are otherwise grossly unremarkable.  The paranasal sinuses and mastoid air cells are well-aerated.  Mild scattered soft tissue air along the frontal calvarium may reflect mild soft tissue injury.  IMPRESSION:  1.  No acute intracranial pathology seen on CT. 2.  Mild scattered soft tissue air along the frontal calvarium may reflect mild soft tissue injury. 3.  Exophthalmos noted.     01/23/2013 - PORTABLE CHEST - 1 VIEW   Findings: The right IJ line is noted ending about the proximal to mid SVC.  The right PICC appears to end within the right atrium, though was noted about the cavoatrial junction on prior studies.  The lungs are well-aerated and clear.  There is no evidence of focal opacification, pleural effusion or pneumothorax.  The cardiomediastinal silhouette is borderline enlarged.  No acute osseous abnormalities are seen.  IMPRESSION:  1.  New right IJ line noted ending about the proximal to mid SVC. 2.  The previously placed right PICC appears to end within the right atrium; this should be retracted approximately 3 cm to the cavoatrial junction, when and as deemed clinically appropriate. 3.  No acute cardiopulmonary process seen; borderline cardiomegaly.     01/23/2013 - PORTABLE CHEST - 1 VIEW Findings: Lung expansion is mildly decreased, though still within normal limits.  There is no evidence of focal opacification, pleural effusion or pneumothorax.  The cardiomediastinal silhouette is borderline enlarged.  No acute osseous abnormalities are seen.  The patient's right PICC is noted ending about the distal SVC.  IMPRESSION: No acute cardiopulmonary process seen.  Borderline cardiomegaly noted.      EKG: 01/23/13 A.Flutter 56bpm  01/24/13 A.Fib 113bpm  Physical Exam: Blood pressure 140/77, pulse 82, temperature 97.9 F (36.6 C),  temperature source Oral, resp. rate 16, height 6' (1.829 m), weight 114.715 kg (252 lb 14.4 oz), SpO2 98.00%. General: Well developed, male, in no acute distress. Head: Normal Neck: Supple. Negative for carotid bruits or JVD Lungs: Clear Heart: Irregularly irregular with S1 S2. No murmurs, rubs, or gallops appreciated. Abdomen: Soft, non-tender, non-distended with normoactive bowel sounds. No hepatomegaly. No rebound/guarding. Msk:  Strength and tone appear normal for age. Extremities:  No clubbing or cyanosis. Trace edmea Neuro: Alert and oriented X 3. Moves all extremities spontaneously. Psych:  Responds to questions appropriately with a normal affect.   Assessment and Plan:  47 y.o. 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  1. Syncope 2/2 medication induced hypotension 2. Atrial Flutter/Fibrillation w/ RVR  3. Acute Renal Failure 4. Microcytic Anemia 5. Septic Arthritis of Left Knee  6. Hyperkalemia 7. Hypertension 8. H/o DVT/PE on coumadin 9. Hyperlipidemia 10. Diabetes mellitus, Type 2  Plan/discussion  Overall much improved. Remains in AF but rate now controlled. BP stable. Renal function worse likely due to hypotension and ATN. Urine output remains good though. Hopefully will recover soon. Would adjust meds to keep SBP > 120.  In reviewing echo, he has evidence of new mild RV dysfunction. Given syncope, h/o PE and RV dilation. I think it is important to exclude recurrent PE. Will order VQ scan.   Otherwise would continue current rate controlling medications and coumadin used as an outpatient. He is to see Dr. Johney Frame as an outpatient (Feb 3) for further recommendations concerning his atrial arrhythmias.    Letricia Krinsky 5:26 PM  Addendum: VQ reviewed. Low prob.  Truman Hayward 5:35 PM

## 2013-01-25 NOTE — Progress Notes (Signed)
Pt on tele afib controlled rate with intermittent sinus beats, converted to NSR with PAC/PVC.  EKG obtained will continue to monitor.

## 2013-01-25 NOTE — Progress Notes (Signed)
[facility] PROGRESS NOTE  Spencer English HYQ:[id number] DOB: 1966/07/01 DOA: 01/22/2013 PCP: Romero Belling, MD  Assessment/Plan: 1. Syncope/Hypotension: Resolved. Secondary to Cialis. Required vasopressors initially. 2. Acute renal failure: Likely secondary to hypotension-induced ATN. Consider AIN secondary to vancomycin, however doubt this given history and vanc trough.he appears to be peaking at this point. Excellent urine output. Potassium normal. Hold off on nephrology consultation for now. 3. Pulmonary edema? Seen on chest x-ray, however clinically appears quite stable without signs of pulmonary compromise. 4. Seizure: Likely related to hypotension. No recurrence monitor.  5. Microcytic anemia: Stable. Needs outpatient followup. 6. Thrombocytopenia: Etiology unclear.has not received heparin or enoxaparin. Recheck CBC in the morning. 7. History of septic arthritis: Continue outpatient vancomycin and ceftriaxone.  8. Atrial fibrillation: INR 4.23. Hold warfarin. No bleeding. Monitor. Continue verapamil metoprolol. Is to see Dr. Johney Frame as an outpatient (Feb 3) for further recommendations concerning his atrial arrhythmias 9. Diabetes: continue Tradjenta.  10. Hypertension: Stable.  Remove central line today.  Code Status: Full code Family Communication: None present Disposition Plan: Home when improved  Brendia Sacks, MD  [facility] Team 5 Pager [phone] If 7PM-7AM, please contact night-coverage at [email or web address], password [facility] 01/25/2013, 10:07 AM  LOS: 3 days   Brief narrative: 47 year old gentleman with a past medical history significant for atrial fibrillation, diabetes, and hypertension with normal EF, who presents to the ED after a syncopal episode complicated by probable seizure secondary to excessive use of Cialis. Blood pressure on arrival of EMS was 80/60. He was brought to the emergency department a central line has been placed and he was started on  norepinephrine after administration of 4 L of crystalloid.  Vasopressors were successfully weaned, patient developed atrial fibrillation with rapid ventricular response and cardiology was consulted. Developed acute renal failure which continues to worsen.  Consultants:  Admitted by critical care, transferred to hospital service 1/20  Cardiology  Nephrology  Procedures:  2-D echocardiogram: Left ventricle: The cavity size was moderately dilated. Wall thickness was normal. Systolic function was normal. The estimated ejection fraction was in the range of 50% to 55%.  Antibiotics:  Vancomycin  Ceftriaxone  HPI/Subjective: Afebrile, vital signs stable. Weight up 4 kg from admission. Excellent urine output. Feels better. No shortness of breath.  Objective: Filed Vitals:   01/24/13 2353 01/25/13 0002 01/25/13 0010 01/25/13 0505  BP:  103/66 108/64 142/83  Pulse: 63 90  93  Temp:    98.3 F (36.8 C)  TempSrc:    Oral  Resp:    16  Height:      Weight:    114.715 kg (252 lb 14.4 oz)  SpO2:  95%  98%    Intake/Output Summary (Last 24 hours) at 01/25/13 1007 Last data filed at 01/25/13 0700  Gross per 24 hour  Intake    895 ml  Output   3300 ml  Net  -2405 ml   Filed Weights   01/23/13 0400 01/24/13 0500 01/25/13 0505  Weight: 111.8 kg (246 lb 7.6 oz) 115.3 kg (254 lb 3.1 oz) 114.715 kg (252 lb 14.4 oz)    Exam:  General:  Appears calm and comfortable, lying nearly flat Cardiovascular: RRR, no m/r/g. 1+ LE edema bilaterally. Respiratory: CTA bilaterally, no w/r/r. Normal respiratory effort. Psychiatric: grossly normal mood and affect, speech fluent and appropriate  Data Reviewed: Basic Metabolic Panel:  Lab 01/25/13 2440 01/24/13 0400 01/23/13 1600 01/23/13 0500 01/23/13 0055  NA 138 138 138 137 138  K 3.8 3.8 3.9  5.2* 3.7  CL 106 105 105 106 105  CO2 19 20 20 20  18*  GLUCOSE 128* 119* 108* 231* 179*  BUN 28* 27* 25* 17 15  CREATININE 3.22* 3.12* 2.79* 1.98*  1.45*  CALCIUM 8.9 8.2* 8.2* 8.1* 8.5  MG -- 1.9 -- 1.5 --  PHOS -- 5.1* -- 4.9* --   CBC:  Lab 01/25/13 0509 01/24/13 0400 01/23/13 0500 01/23/13 0055  WBC 7.3 8.0 11.3* 7.5  NEUTROABS -- -- -- 5.0  HGB 10.4* 10.1* 11.1* 10.4*  HCT 32.3* 31.3* 35.4* 33.9*  MCV 72.6* 73.6* 75.8* 77.2*  PLT 127* 146* 214 228   Cardiac Enzymes:  Lab 01/24/13 0411  CKTOTAL --  CKMB --  CKMBINDEX --  TROPONINI <0.30   CBG:  Lab 01/25/13 0736 01/24/13 2129 01/24/13 1611 01/24/13 1304 01/24/13 0731  GLUCAP 138* 162* 145* 125* 108*    Recent Results (from the past 240 hour(s))  CULTURE, BLOOD (ROUTINE X 2)     Status: Normal (Preliminary result)   Collection Time   01/23/13  2:00 AM      Component Value Range Status Comment   Specimen Description BLOOD RIGHT ARM   Final    Special Requests BOTTLES DRAWN AEROBIC ONLY 10CC   Final    Culture  Setup Time 01/23/2013 15:01   Final    Culture     Final    Value:        BLOOD CULTURE RECEIVED NO GROWTH TO DATE CULTURE WILL BE HELD FOR 5 DAYS BEFORE ISSUING A FINAL NEGATIVE REPORT   Report Status PENDING   Incomplete   CULTURE, BLOOD (ROUTINE X 2)     Status: Normal (Preliminary result)   Collection Time   01/23/13  2:12 AM      Component Value Range Status Comment   Specimen Description BLOOD LEFT ARM   Final    Special Requests BOTTLES DRAWN AEROBIC ONLY 8CC   Final    Culture  Setup Time 01/23/2013 15:02   Final    Culture     Final    Value:        BLOOD CULTURE RECEIVED NO GROWTH TO DATE CULTURE WILL BE HELD FOR 5 DAYS BEFORE ISSUING A FINAL NEGATIVE REPORT   Report Status PENDING   Incomplete   MRSA PCR SCREENING     Status: Normal   Collection Time   01/23/13  3:43 AM      Component Value Range Status Comment   MRSA by PCR NEGATIVE  NEGATIVE Final      Studies: Dg Chest Port 1 View  01/25/2013  *RADIOLOGY REPORT*  Clinical Data: Shortness of breath, evaluate for fluid overload  PORTABLE CHEST - 1 VIEW  Comparison: 01/23/2013;  01/07/2013; 01/03/2039  Findings:  Grossly unchanged enlarged cardiac silhouette and mediastinal contours.  Likely interval retraction of right upper extremity approach PICC line with tip now projects in the superior atrial junction.  Stable positioning of right jugular approach central venous catheter.  The pulmonary vasculature is less distinct with cephalization of flow.  Worsening bibasilar opacities.  Query trace right-sided effusion.  No pneumothorax.  Unchanged bones.  IMPRESSION: 1. Interval retraction of right upper extremity approach PICC line with tip now overlying the superior cavoatrial junction. 2.  Findings suggestive of worsening pulmonary edema with possible trace right-sided effusion.   Original Report Authenticated By: Tacey Ruiz, MD     Scheduled Meds:   . atorvastatin  20 mg Oral q1800  .  cefTRIAXone (ROCEPHIN) IVPB 1 gram/50 mL D5W  1 g Intravenous Q24H  . feeding supplement  237 mL Oral TID BM  . insulin aspart  0-9 Units Subcutaneous TID WC  . linagliptin  5 mg Oral Daily  . loratadine  10 mg Oral Daily  . metoprolol tartrate  25 mg Oral BID  . vancomycin  1,500 mg Intravenous Q24H  . verapamil  80 mg Oral Q8H  . Warfarin - Physician Dosing Inpatient   Does not apply q1800   Continuous Infusions:   Principal Problem:  *Hypotension due to drugs Active Problems:  DM  Encounter for long-term (current) use of anticoagulants  Atrial fibrillation  History of septic arthritis  Syncope  Acute renal failure  Microcytic anemia  Thrombocytopenia     Brendia Sacks, MD  [facility] Team 5 Pager [phone] If 7PM-7AM, please contact night-coverage at [email or web address], password [facility] 01/25/2013, 10:07 AM  LOS: 3 days   Time spent: 20 minutes

## 2013-01-26 ENCOUNTER — Inpatient Hospital Stay (HOSPITAL_COMMUNITY): Payer: BC Managed Care – PPO

## 2013-01-26 DIAGNOSIS — N179 Acute kidney failure, unspecified: Secondary | ICD-10-CM

## 2013-01-26 DIAGNOSIS — I272 Pulmonary hypertension, unspecified: Secondary | ICD-10-CM | POA: Diagnosis present

## 2013-01-26 LAB — CBC
MCH: 23.7 pg — ABNORMAL LOW (ref 26.0–34.0)
MCHC: 32.3 g/dL (ref 30.0–36.0)
MCV: 73.3 fL — ABNORMAL LOW (ref 78.0–100.0)
Platelets: 128 10*3/uL — ABNORMAL LOW (ref 150–400)
RBC: 4.35 MIL/uL (ref 4.22–5.81)
RDW: 16 % — ABNORMAL HIGH (ref 11.5–15.5)

## 2013-01-26 LAB — BASIC METABOLIC PANEL
Calcium: 8.8 mg/dL (ref 8.4–10.5)
Creatinine, Ser: 3.03 mg/dL — ABNORMAL HIGH (ref 0.50–1.35)
GFR calc non Af Amer: 23 mL/min — ABNORMAL LOW (ref >90)
Glucose, Bld: 144 mg/dL — ABNORMAL HIGH (ref 70–99)
Sodium: 139 mEq/L (ref 135–145)

## 2013-01-26 LAB — GLUCOSE, CAPILLARY: Glucose-Capillary: 134 mg/dL — ABNORMAL HIGH (ref 70–99)

## 2013-01-26 MED ORDER — OXYCODONE-ACETAMINOPHEN 5-325 MG PO TABS
1.0000 | ORAL_TABLET | Freq: Once | ORAL | Status: AC
  Administered 2013-01-26: 1 via ORAL
  Filled 2013-01-26: qty 1

## 2013-01-26 MED ORDER — PANTOPRAZOLE SODIUM 40 MG PO TBEC
40.0000 mg | DELAYED_RELEASE_TABLET | Freq: Every day | ORAL | Status: DC
  Administered 2013-01-27: 40 mg via ORAL
  Filled 2013-01-26: qty 1

## 2013-01-26 MED ORDER — ALTEPLASE 2 MG IJ SOLR
2.0000 mg | Freq: Once | INTRAMUSCULAR | Status: AC
  Administered 2013-01-26: 2 mg
  Filled 2013-01-26 (×2): qty 2

## 2013-01-26 NOTE — Progress Notes (Addendum)
Pt complaining of RUQ pain and increased tightness around 10 pm. Bowel sounds audible, abdomen not firm, but soft, and tender in the RUQ to touch. Pt states he is unable to catch his breath rating it a 10/10.  Pt demands to see a Dr. Gaylyn Rong signs T 98.1, P 100, BP 150/90, 02 100 on 2L N/C.  Vital signs around 11pm T 98.6 P 90 BP184/102, 02 96% on 2L N/C.  Patient in A-fib, with increasing HR up to the 120's-130's. EKG refused by pt. Dr. On call notified and made aware. Rapid Response came to see pt. 1 PO Percocet ordered and administered, and patient's other PO medications administered during that time.  Vitals around 12 AM reflected improvement.;T98.2 P 65 R 18 BP 110/68 02 96 2L N/C. Will continue to monitor patient.

## 2013-01-26 NOTE — Progress Notes (Signed)
Event: RN notified me that pt has had repeated c/o's of RUQ that has become worse. Pt denies n/v, but is demanding to see a doctor. States he feels like he needs an xray. Reports pain is 8/10. Percocet 1 tablet ordered to be given now. NP to bedside.  Subjective: Pt reports that approx one week ago he began to have generalized "tightness" in his abd. States he hasn't had much appetite because he always felt so full. Tonight he reports he began to have RUQ abd pain that has worsened. States pain is worse with inspiration and pressing in the area.  Objective: Spencer English is a 47 year old gentleman with a past medical history significant for atrial fibrillation, diabetes, and hypertension with normal EF, who presents to the ED after a syncopal episode complicated by probable seizure secondary to excessive use of Cialis. Blood pressure on arrival of EMS was 80/60. He was brought to the emergency department a central line has been placed and he was started on norepinephrine after administration of 4 L of crystalloid. Vasopressors were successfully weaned, patient developed atrial fibrillation with rapid ventricular response and cardiology was consulted. Developed acute renal failure which continues to worsen. At bedside pt noted lying quietly in bed. Current VS, T-98.6, BP-184/102, P-65, R-18 w/ 02 sats of 96% on 2L [place]. BBS CTA, Abd slightly distended but soft w/ normal bs. There is significant TTP over the RUQ that radiates to his (R) upper back. Last BM 01/25/2013. CXR 01/25/2013 concerning for small (R) pleural effusion.  Assessment/Plan: 1. RUQ abd pain: Likely pleuritic vs GI source given hx and PE. CXR suggests (R) sided pleural effusion. Exam concerning for gallstones.  I recommended the pt have an abd u/s to r/o gallstones. I discussed with u/s tech who stated pt could be done at approx 0600 today after 6-8 hrs of NPO. After discussing this w/ pt he states he can not go that long w/o eating or drinking so he has  declined the u/s for now and states he will discuss with rounding MD in am. Pt reports percocet has helped his pain. Will continue Percocet 1 PO q6h PRN and defer further changes in pt's plan of care to rounding MD in am. Will continue to monitor closely.  Leanne Chang, NP-C [facility] Pager [phone]

## 2013-01-26 NOTE — Progress Notes (Signed)
[facility] PROGRESS NOTE  KENNA KIRN ZOX:[id number] DOB: 1966-07-30 DOA: 01/22/2013 PCP: Romero Belling, MD  Assessment/Plan: 1. Syncope/Hypotension: Resolved. Secondary to Cialis. Required vasopressors initially. 2. Acute renal failure: Likely secondary to hypotension-induced ATN. Consider AIN secondary to vancomycin, however doubt this given history and vanc trough.he appears to be peaking at this point. Excellent urine output. Potassium normal. Hold off on nephrology consultation for now. 3. Pulmonary edema? Seen on chest x-ray, however clinically appears quite stable without signs of pulmonary compromise. 4. Seizure: Likely related to hypotension. No recurrence monitor.  5. Microcytic anemia: Stable. Needs outpatient followup. 6. Thrombocytopenia: Etiology unclear.has not received heparin or enoxaparin. Recheck CBC in the morning. 7. History of septic arthritis: Continue outpatient vancomycin and ceftriaxone.  8. Atrial fibrillation: INR 4.23. Hold warfarin. No bleeding. Monitor. Continue verapamil metoprolol. Is to see Dr. Johney Frame as an outpatient (Feb 3) for further recommendations concerning his atrial arrhythmias 9. Diabetes: continue Tradjenta.  10. Hypertension: Stable. 11. RUQ abd pain - ? Etiology - Abd [place] negative for gallstones - improved - advance diet   Remove central line today.  Code Status: Full code Family Communication: None present Disposition Plan: Home when improved   Brief narrative: 47 year old gentleman with a past medical history significant for atrial fibrillation, diabetes, and hypertension with normal EF, who presents to the ED after a syncopal episode complicated by probable seizure secondary to excessive use of Cialis. Blood pressure on arrival of EMS was 80/60. He was brought to the emergency department a central line has been placed and he was started on norepinephrine after administration of 4 L of crystalloid.  Vasopressors were successfully  weaned, patient developed atrial fibrillation with rapid ventricular response and cardiology was consulted.   Consultants:  Admitted by critical care, transferred to hospitalist service 1/20  Cardiology  Nephrology  Procedures:  2-D echocardiogram: Left ventricle: The cavity size was moderately dilated. Wall thickness was normal. Systolic function was normal. The estimated ejection fraction was in the range of 50% to 55%. - V/Q scan low prob for PE  Antibiotics:  Vancomycin  Ceftriaxone  HPI/Subjective: C/o ruq abd pain   Objective: Filed Vitals:   01/25/13 2200 01/25/13 2320 01/26/13 0006 01/26/13 0538  BP: 150/90 184/102 110/68 141/86  Pulse: 100 90 65 118  Temp: 98.1 F (36.7 C) 98.6 F (37 C) 98.2 F (36.8 C) 97.9 F (36.6 C)  TempSrc:      Resp: 20  18 18   Height:      Weight:      SpO2: 100% 96% 96% 96%    Intake/Output Summary (Last 24 hours) at 01/26/13 0812 Last data filed at 01/25/13 1546  Gross per 24 hour  Intake   1090 ml  Output   1100 ml  Net    -10 ml   Filed Weights   01/23/13 0400 01/24/13 0500 01/25/13 0505  Weight: 111.8 kg (246 lb 7.6 oz) 115.3 kg (254 lb 3.1 oz) 114.715 kg (252 lb 14.4 oz)    Exam:  General:  Appears calm and comfortable, lying nearly flat Cardiovascular: RRR, no m/r/g. 1+ LE edema bilaterally. Respiratory: CTA bilaterally, no w/r/r. Normal respiratory effort. Abdomen : soft, min ruq tenderness, no rebound  Data Reviewed: Basic Metabolic Panel:  Lab 01/26/13 8119 01/25/13 0509 01/24/13 0400 01/23/13 1600 01/23/13 0500  NA 139 138 138 138 137  K 4.1 3.8 3.8 3.9 5.2*  CL 106 106 105 105 106  CO2 21 19 20 20 20   GLUCOSE 144*  128* 119* 108* 231*  BUN 24* 28* 27* 25* 17  CREATININE 3.03* 3.22* 3.12* 2.79* 1.98*  CALCIUM 8.8 8.9 8.2* 8.2* 8.1*  MG -- -- 1.9 -- 1.5  PHOS -- -- 5.1* -- 4.9*   CBC:  Lab 01/26/13 0545 01/25/13 0509 01/24/13 0400 01/23/13 0500 01/23/13 0055  WBC 9.3 7.3 8.0 11.3* 7.5    NEUTROABS -- -- -- -- 5.0  HGB 10.3* 10.4* 10.1* 11.1* 10.4*  HCT 31.9* 32.3* 31.3* 35.4* 33.9*  MCV 73.3* 72.6* 73.6* 75.8* 77.2*  PLT 128* 127* 146* 214 228   Cardiac Enzymes:  Lab 01/24/13 0411  CKTOTAL --  CKMB --  CKMBINDEX --  TROPONINI <0.30   CBG:  Lab 01/26/13 0731 01/25/13 1720 01/25/13 1201 01/25/13 0736 01/24/13 2129  GLUCAP 134* 159* 147* 138* 162*    Recent Results (from the past 240 hour(s))  CULTURE, BLOOD (ROUTINE X 2)     Status: Normal (Preliminary result)   Collection Time   01/23/13  2:00 AM      Component Value Range Status Comment   Specimen Description BLOOD RIGHT ARM   Final    Special Requests BOTTLES DRAWN AEROBIC ONLY 10CC   Final    Culture  Setup Time 01/23/2013 15:01   Final    Culture     Final    Value:        BLOOD CULTURE RECEIVED NO GROWTH TO DATE CULTURE WILL BE HELD FOR 5 DAYS BEFORE ISSUING A FINAL NEGATIVE REPORT   Report Status PENDING   Incomplete   CULTURE, BLOOD (ROUTINE X 2)     Status: Normal (Preliminary result)   Collection Time   01/23/13  2:12 AM      Component Value Range Status Comment   Specimen Description BLOOD LEFT ARM   Final    Special Requests BOTTLES DRAWN AEROBIC ONLY 8CC   Final    Culture  Setup Time 01/23/2013 15:02   Final    Culture     Final    Value:        BLOOD CULTURE RECEIVED NO GROWTH TO DATE CULTURE WILL BE HELD FOR 5 DAYS BEFORE ISSUING A FINAL NEGATIVE REPORT   Report Status PENDING   Incomplete   MRSA PCR SCREENING     Status: Normal   Collection Time   01/23/13  3:43 AM      Component Value Range Status Comment   MRSA by PCR NEGATIVE  NEGATIVE Final      Studies: Nm Pulmonary Perf And Vent  01/25/2013  *RADIOLOGY REPORT*  Clinical Data:  Syncopal episode, hypotension, elevated dilated right ventricle on echo, evaluate for pulmonary embolism  NUCLEAR MEDICINE VENTILATION - PERFUSION LUNG SCAN  Technique:  Wash-in, equilibrium, and wash-out phase ventilation images were obtained using Xe-133  gas.  Perfusion images were obtained in multiple projections after intravenous injection of Tc- 67m MAA.  Radiopharmaceuticals:  40 mCi aerosolized Tc-DTPA and 6 mCi Tc-70m MAA.  Comparison:  Chest radiograph - earlier same day  Findings:  Review of the chest radiograph performed earlier same day demonstrates an enlarged cardiac silhouette and worsening pulmonary edema.  Review of the ventilatory images demonstrates a very slight relative oligemia of the right lung in relation to the left but is negative for geographic area of non ventilation. Ingested radiotracer is seen within the superior aspect of the esophagus and stomach.  Review of the perfusion images demonstrate homogeneous distribution of injected radiotracer without geographic segmental or subsegmental mismatched  filling defect.  IMPRESSION: Negative for pulmonary embolism (very low probability for pulmonary embolism).   Original Report Authenticated By: Tacey Ruiz, MD    Dg Chest Port 1 View  01/25/2013  *RADIOLOGY REPORT*  Clinical Data: Shortness of breath, evaluate for fluid overload  PORTABLE CHEST - 1 VIEW  Comparison: 01/23/2013; 01/07/2013; 01/03/2039  Findings:  Grossly unchanged enlarged cardiac silhouette and mediastinal contours.  Likely interval retraction of right upper extremity approach PICC line with tip now projects in the superior atrial junction.  Stable positioning of right jugular approach central venous catheter.  The pulmonary vasculature is less distinct with cephalization of flow.  Worsening bibasilar opacities.  Query trace right-sided effusion.  No pneumothorax.  Unchanged bones.  IMPRESSION: 1. Interval retraction of right upper extremity approach PICC line with tip now overlying the superior cavoatrial junction. 2.  Findings suggestive of worsening pulmonary edema with possible trace right-sided effusion.   Original Report Authenticated By: Tacey Ruiz, MD     Scheduled Meds:    . atorvastatin  20 mg Oral q1800    . cefTRIAXone (ROCEPHIN) IVPB 1 gram/50 mL D5W  1 g Intravenous Q24H  . feeding supplement  237 mL Oral TID BM  . insulin aspart  0-9 Units Subcutaneous TID WC  . linagliptin  5 mg Oral Daily  . loratadine  10 mg Oral Daily  . metoprolol tartrate  25 mg Oral BID  . vancomycin  1,500 mg Intravenous Q24H  . verapamil  80 mg Oral Q8H  . Warfarin - Pharmacist Dosing Inpatient   Does not apply q1800   Continuous Infusions:   Principal Problem:  *Hypotension due to drugs Active Problems:  DM  Encounter for long-term (current) use of anticoagulants  Atrial fibrillation  History of septic arthritis  Syncope  Acute renal failure  Microcytic anemia  Thrombocytopenia  Moderate to severe pulmonary hypertension     Lonia Blood, MD  [facility] Team 5 Pager [phone] If 7PM-7AM, please contact night-coverage at [email or web address], password [facility] 01/26/2013, 8:12 AM  LOS: 4 days

## 2013-01-26 NOTE — Progress Notes (Signed)
CARDIOLOGY CONSULT NOTE  Patient ID: Spencer English, MRN: [id number], DOB/AGE: 05/25/1966 47 y.o. Admit date: 01/22/2013 Date of Consult: 01/26/2013  Primary Physician: Romero Belling, MD Primary Cardiologist: Dr. [place]  Chief Complaint: Syncope  Reason for Consultation: A.Fib/flutter w/ RVR  HPI: 47 y.o. 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  Complains of RUQ and RLQ abdominal pain last night. BP spiked with pain. Remains in AF/AFL but rate now controlled. No dyspnea. Renal function better today still with good urine output.   Echo reviewed EF 50-55% with mild RV dilation and moderate TR (new from previous in Nov 2013). Mild to moderate MR. biatrial enlargement.   Past Medical History  Diagnosis Date  . DM 08/08/2008  . DYSLIPIDEMIA 04/26/2009  . GOUT 10/24/2010  . HYPERTENSION 07/21/2007  . PULMONARY EMBOLISM 05/03/2009  . DEEP VENOUS THROMBOPHLEBITIS, LEG, LEFT 05/05/2009  . ALLERGIC RHINITIS 04/12/2009  . UNSPECIFIED URINARY CALCULUS 07/04/2010  . 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 ECZEMA 05/23/2010  . Long term (current) use of anticoagulants 04/03/2011  . BACK PAIN, LUMBAR 01/14/2011  . 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 Hyperglycemia   . Leukopenia   . Atrial fibrillation   . GERD (gastroesophageal reflux disease)       Surgical History:  Past Surgical History  Procedure Date  . Anterior cruciate ligament repair 2000    Right  . Knee arthroscopy     left knee  . Knee arthroscopy w/ acl reconstruction     right knee  . Cystectomy     back of head  . Knee arthroscopy 01/03/2013    Procedure: ARTHROSCOPY KNEE;  Surgeon: Thera Flake., MD;  Location: [facility] OR;  Service: Orthopedics;  Laterality: Left;  Lavage Synovectomy, Removal of loose body     Home Meds: Medication Sig    allopurinol (ZYLOPRIM) 300 MG tablet Take 1 tablet (300 mg total) by mouth as needed. For gout flare-up.  atorvastatin (LIPITOR) 20 MG tablet Take 1 tablet (20 mg total) by mouth daily.   Inpatient Medications:   . atorvastatin  20 mg Oral q1800  . cefTRIAXone (ROCEPHIN) IVPB 1 gram/50 mL D5W  1 g Intravenous Q24H  . feeding supplement  237 mL Oral TID BM  . insulin aspart  0-9 Units Subcutaneous TID WC  . linagliptin  5 mg Oral Daily  . loratadine  10 mg Oral Daily  . metoprolol tartrate  25 mg Oral BID  . vancomycin  1,500 mg Intravenous Q24H  . warfarin  3 mg Oral ONCE-1800  . Warfarin - Physician Dosing Inpatient   Does not apply q1800      Allergies:  Allergies  Allergen Reactions  . Metformin     REACTION: nausea/diarrhea    History   Social History  . Marital Status: Legally Separated    Spouse Name: N/A    Number of Children: 3  . 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     Social History Main Topics  . Smoking status: Former Smoker    Quit date: 01/07/2005  . Smokeless tobacco: Never Used  . Alcohol Use: Yes     Comment: Occ.  . Drug Use: No  . Sexually Active:    Other Topics Concern  . Not on  file   Social History Narrative  . No narrative on file     Labs:  Basename 01/24/13 0411  TROPONINI <0.30   Component Value Date   WBC 8.0 01/24/2013   HGB 10.1* 01/24/2013   HCT 31.3* 01/24/2013   MCV 73.6* 01/24/2013   PLT 146* 01/24/2013    Lab 01/24/13 0400  NA 138  K 3.8  CL 105  CO2 20  BUN 27*  CREATININE 3.12*  CALCIUM 8.2*  GLUCOSE 119*    Radiology/Studies:   01/23/2013 - CT HEAD WITHOUT CONTRAST   Findings: There is no evidence of acute infarction, mass lesion, or intra- or extra-axial hemorrhage on CT.  The posterior fossa, including the cerebellum, brainstem and fourth ventricle, is within normal limits.  The third and lateral ventricles, and basal ganglia are unremarkable in appearance.  The cerebral  hemispheres are symmetric in appearance, with normal gray- white differentiation.  No mass effect or midline shift is seen.  There is no evidence of fracture; visualized osseous structures are unremarkable in appearance.  Exophthalmos is noted; the orbits are otherwise grossly unremarkable.  The paranasal sinuses and mastoid air cells are well-aerated.  Mild scattered soft tissue air along the frontal calvarium may reflect mild soft tissue injury.  IMPRESSION:  1.  No acute intracranial pathology seen on CT. 2.  Mild scattered soft tissue air along the frontal calvarium may reflect mild soft tissue injury. 3.  Exophthalmos noted.     01/23/2013 - PORTABLE CHEST - 1 VIEW   Findings: The right IJ line is noted ending about the proximal to mid SVC.  The right PICC appears to end within the right atrium, though was noted about the cavoatrial junction on prior studies.  The lungs are well-aerated and clear.  There is no evidence of focal opacification, pleural effusion or pneumothorax.  The cardiomediastinal silhouette is borderline enlarged.  No acute osseous abnormalities are seen.  IMPRESSION:  1.  New right IJ line noted ending about the proximal to mid SVC. 2.  The previously placed right PICC appears to end within the right atrium; this should be retracted approximately 3 cm to the cavoatrial junction, when and as deemed clinically appropriate. 3.  No acute cardiopulmonary process seen; borderline cardiomegaly.     01/23/2013 - PORTABLE CHEST - 1 VIEW Findings: Lung expansion is mildly decreased, though still within normal limits.  There is no evidence of focal opacification, pleural effusion or pneumothorax.  The cardiomediastinal silhouette is borderline enlarged.  No acute osseous abnormalities are seen.  The patient's right PICC is noted ending about the distal SVC.  IMPRESSION: No acute cardiopulmonary process seen.  Borderline cardiomegaly noted.      *RADIOLOGY REPORT*  Clinical Data: Syncopal  episode, hypotension, elevated dilated  right ventricle on echo, evaluate for pulmonary embolism  NUCLEAR MEDICINE VENTILATION - PERFUSION LUNG SCAN  Technique: Wash-in, equilibrium, and wash-out phase ventilation  images were obtained using Xe-133 gas. Perfusion images were  obtained in multiple projections after intravenous injection of Tc-  18m MAA.  Radiopharmaceuticals: 40 mCi aerosolized Tc-DTPA and 6 mCi Tc-50m  MAA.  Comparison: Chest radiograph - earlier same day  Findings:  Review of the chest radiograph performed earlier same day  demonstrates an enlarged cardiac silhouette and worsening pulmonary  edema.  Review of the ventilatory images demonstrates a very slight  relative oligemia of the right lung in relation to the left but is  negative for geographic area of non ventilation. Ingested  radiotracer  is seen within the superior aspect of the esophagus and  stomach.  Review of the perfusion images demonstrate homogeneous distribution  of injected radiotracer without geographic segmental or  subsegmental mismatched filling defect.  IMPRESSION:  Negative for pulmonary embolism (very low probability for pulmonary  embolism).  Original Report Authenticated By: Tacey Ruiz, MD  EKG: 01/23/13 A.Flutter 56bpm  01/24/13 A.Fib 113bpm  Physical Exam: Blood pressure 141/86, pulse 118, temperature 97.9 F (36.6 C), temperature source Oral, resp. rate 18, height 6' (1.829 m), weight 252 lb 14.4 oz (114.715 kg), SpO2 96.00%. General: Well developed, male, in no acute distress. Head: Normal Neck: Supple. Negative for carotid bruits or JVD Lungs: Clear Heart: Irregularly irregular with S1 S2. No murmurs, rubs, or gallops appreciated. Abdomen: Soft, some RUQ and RLQ pain to palpation, non-distended with normoactive bowel sounds. No hepatomegaly. No rebound/guarding. Msk:  Strength and tone appear normal for age. Extremities:  No clubbing or cyanosis. Trace edmea Neuro: Alert and  oriented X 3. Moves all extremities spontaneously. Psych:  Responds to questions appropriately with a normal affect.   Assessment and Plan:  47 y.o. 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  1. Syncope 2/2 medication induced hypotension. V/Q scan is normal. 2. Atrial Flutter/Fibrillation w/ RVR, rate controlled now on metoprolol and verapamil. 3. Acute Renal Failure 4. Microcytic Anemia 5. Septic Arthritis of Left Knee  6. Hyperkalemia 7. Hypertension 8. H/o DVT/PE on coumadin 9. Hyperlipidemia 10. Diabetes mellitus, Type 2 11. Abdominal pain.  12. Low TSH c/w hyperthyroidism- per primary team.  Plan/discussion  Overall  improved. Remains in AF but rate now controlled. BP stable. Renal dysfunction likely due to hypotension and ATN. Urine output remains good though. Creatinine a little better today. Would adjust meds to keep SBP > 120.  Agree with abdominal ultrasound to evaluate abdominal pain.  Otherwise would continue current rate controlling medications and coumadin used as an outpatient. He is tentatively scheduled to  see Dr. Johney Frame as an outpatient (Feb 3) for further recommendations concerning his atrial arrhythmias.    Theron Arista [facility] 01/26/2013 7:43 AM

## 2013-01-26 NOTE — Care Management Note (Unsigned)
    Page 1 of 1   01/26/2013     11:37:49 AM   CARE MANAGEMENT NOTE 01/26/2013  Patient:  Spencer English, Spencer English   Account Number:  [id number]  Date Initiated:  01/26/2013  Documentation initiated by:  GRAVES-BIGELOW,Ladarryl Wrage  Subjective/Objective Assessment:   Pt admitted with syncopal episode due to Cialis intake in the setting of AF with RVR with resultant hypotension.     Action/Plan:   CM will continue to monitor for disposition needs.   Anticipated DC Date:  01/28/2013   Anticipated DC Plan:  HOME/SELF CARE      DC Planning Services  CM consult      Choice offered to / List presented to:             Status of service:  In process, will continue to follow Medicare Important Message given?   (If response is "NO", the following Medicare IM given date fields will be blank) Date Medicare IM given:   Date Additional Medicare IM given:    Discharge Disposition:    Per UR Regulation:  Reviewed for med. necessity/level of care/duration of stay  If discussed at Long Length of Stay Meetings, dates discussed:    Comments:  Planfor abdominal ultrasound today to evaluate abdominal pain. 1137 01-26-13 Tomi Bamberger, RN,BSN

## 2013-01-26 NOTE — Progress Notes (Signed)
ANTICOAGULATION CONSULT NOTE - Follow Up Consult  Pharmacy Consult for Coumadin Indication: hx DVT/PE, atrial fibrillation  Allergies  Allergen Reactions  . Metformin     REACTION: nausea/diarrhea    Patient Measurements: Height: 6' (182.9 cm) Weight: 252 lb 14.4 oz (114.715 kg) IBW/kg (Calculated) : 77.6   Vital Signs: Temp: 97.9 F (36.6 C) (01/29 0538) BP: 133/81 mmHg (01/29 1009) Pulse Rate: 62  (01/29 1009)  Labs:  Basename 01/26/13 0545 01/25/13 0509 01/24/13 0411 01/24/13 0400  HGB 10.3* 10.4* -- --  HCT 31.9* 32.3* -- 31.3*  PLT 128* 127* -- 146*  APTT -- -- -- --  LABPROT 32.4* 38.1* -- 30.4*  INR 3.40* 4.23* -- 3.12*  HEPARINUNFRC -- -- -- --  CREATININE 3.03* 3.22* -- 3.12*  CKTOTAL -- -- -- --  CKMB -- -- -- --  TROPONINI -- -- <0.30 --    Estimated Creatinine Clearance: 39.4 ml/min (by C-G formula based on Cr of 3.03).  Assessment: 47 y/o male on chronic Coumadin for hx DVT/PE and Afib. INR is supratherapeutic today. No bleeding noted, H/H are stable, platelets are trending down which could be from vancomycin.  Goal of Therapy:  INR 2-3 Monitor platelets by anticoagulation protocol: Yes   Plan:  -No Coumadin tonight -INR daily  [facility], [address].D., BCPS Clinical Pharmacist Pager: [phone] 01/26/2013 2:24 PM

## 2013-01-27 ENCOUNTER — Telehealth: Payer: Self-pay

## 2013-01-27 DIAGNOSIS — E0590 Thyrotoxicosis, unspecified without thyrotoxic crisis or storm: Secondary | ICD-10-CM

## 2013-01-27 LAB — CBC WITH DIFFERENTIAL/PLATELET
Basophils Absolute: 0 10*3/uL (ref 0.0–0.1)
Eosinophils Relative: 2 % (ref 0–5)
HCT: 29.9 % — ABNORMAL LOW (ref 39.0–52.0)
Hemoglobin: 9.7 g/dL — ABNORMAL LOW (ref 13.0–17.0)
Lymphocytes Relative: 15 % (ref 12–46)
Lymphs Abs: 1.3 10*3/uL (ref 0.7–4.0)
MCV: 72.7 fL — ABNORMAL LOW (ref 78.0–100.0)
Monocytes Absolute: 1.1 10*3/uL — ABNORMAL HIGH (ref 0.1–1.0)
Monocytes Relative: 13 % — ABNORMAL HIGH (ref 3–12)
Neutro Abs: 5.8 10*3/uL (ref 1.7–7.7)
RBC: 4.11 MIL/uL — ABNORMAL LOW (ref 4.22–5.81)
WBC: 8.3 10*3/uL (ref 4.0–10.5)

## 2013-01-27 LAB — PROTIME-INR: INR: 2.64 — ABNORMAL HIGH (ref 0.00–1.49)

## 2013-01-27 LAB — COMPREHENSIVE METABOLIC PANEL
ALT: 366 U/L — ABNORMAL HIGH (ref 0–53)
AST: 63 U/L — ABNORMAL HIGH (ref 0–37)
Alkaline Phosphatase: 130 U/L — ABNORMAL HIGH (ref 39–117)
CO2: 21 mEq/L (ref 19–32)
Calcium: 8.7 mg/dL (ref 8.4–10.5)
GFR calc Af Amer: 28 mL/min — ABNORMAL LOW (ref >90)
GFR calc non Af Amer: 24 mL/min — ABNORMAL LOW (ref >90)
Glucose, Bld: 122 mg/dL — ABNORMAL HIGH (ref 70–99)
Potassium: 4 mEq/L (ref 3.5–5.1)
Sodium: 139 mEq/L (ref 135–145)
Total Protein: 6.1 g/dL (ref 6.0–8.3)

## 2013-01-27 LAB — VANCOMYCIN, TROUGH: Vancomycin Tr: 26.1 ug/mL (ref 10.0–20.0)

## 2013-01-27 LAB — GLUCOSE, CAPILLARY

## 2013-01-27 MED ORDER — ACETAMINOPHEN 325 MG PO TABS: 650.0000 mg | ORAL_TABLET | Freq: Four times a day (QID) | ORAL | Status: DC | PRN

## 2013-01-27 MED ORDER — METHIMAZOLE 10 MG PO TABS
40.0000 mg | ORAL_TABLET | Freq: Two times a day (BID) | ORAL | Status: DC
  Administered 2013-01-27: 40 mg via ORAL
  Filled 2013-01-27 (×2): qty 4

## 2013-01-27 MED ORDER — METHIMAZOLE 10 MG PO TABS: 40.0000 mg | ORAL_TABLET | Freq: Two times a day (BID) | ORAL | Status: DC

## 2013-01-27 MED ORDER — PANTOPRAZOLE SODIUM 40 MG PO TBEC: 40.0000 mg | DELAYED_RELEASE_TABLET | Freq: Every day | ORAL | Status: DC

## 2013-01-27 MED ORDER — WARFARIN SODIUM 5 MG PO TABS
5.0000 mg | ORAL_TABLET | Freq: Once | ORAL | Status: DC
  Filled 2013-01-27: qty 1

## 2013-01-27 MED ORDER — VANCOMYCIN HCL 10 G IV SOLR
1250.0000 mg | INTRAVENOUS | Status: DC
  Filled 2013-01-27: qty 1250

## 2013-01-27 NOTE — Telephone Encounter (Signed)
Dr. Lavera Guise calling from the hospital.  Pt is currently in patient and hyperthyroid.  He would like to speak with you regarding his treatment as soon as you can.

## 2013-01-27 NOTE — Progress Notes (Signed)
CARDIOLOGY CONSULT NOTE  Patient ID: Spencer English, MRN: [id number], DOB/AGE: Aug 12, 1966 47 y.o. Admit date: 01/22/2013 Date of Consult: 01/27/2013  Primary Physician: Romero Belling, MD Primary Cardiologist: Dr. [place]  Chief Complaint: Syncope  Reason for Consultation: A.Fib/flutter w/ RVR  HPI: 47 y.o. 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  Complaints of RUQ  abdominal pain improved. In and out of AF/AFL but rate controlled. No dyspnea. Renal function better today still with good urine output.   Echo reviewed EF 50-55% with mild RV dilation and moderate TR (new from previous in Nov 2013). Mild to moderate MR. biatrial enlargement.   Past Medical History  Diagnosis Date  . DM 08/08/2008  . DYSLIPIDEMIA 04/26/2009  . GOUT 10/24/2010  . HYPERTENSION 07/21/2007  . PULMONARY EMBOLISM 05/03/2009  . DEEP VENOUS THROMBOPHLEBITIS, LEG, LEFT 05/05/2009  . ALLERGIC RHINITIS 04/12/2009  . UNSPECIFIED URINARY CALCULUS 07/04/2010  . 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 ECZEMA 05/23/2010  . Long term (current) use of anticoagulants 04/03/2011  . BACK PAIN, LUMBAR 01/14/2011  . 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 Hyperglycemia   . Leukopenia   . Atrial fibrillation   . GERD (gastroesophageal reflux disease)       Surgical History:  Past Surgical History  Procedure Date  . Anterior cruciate ligament repair 2000    Right  . Knee arthroscopy     left knee  . Knee arthroscopy w/ acl reconstruction     right knee  . Cystectomy     back of head  . Knee arthroscopy 01/03/2013    Procedure: ARTHROSCOPY KNEE;  Surgeon: Thera Flake., MD;  Location: [facility] OR;  Service: Orthopedics;  Laterality: Left;  Lavage Synovectomy, Removal of loose body      Allergies:  Allergies  Allergen Reactions  . Metformin      REACTION: nausea/diarrhea    History   Social History  . Marital Status: Legally Separated    Spouse Name: N/A    Number of Children: 3  . 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     Social History Main Topics  . Smoking status: Former Smoker    Quit date: 01/07/2005  . Smokeless tobacco: Never Used  . Alcohol Use: Yes     Comment: Occ.  . Drug Use: No  . Sexually Active:    Other Topics Concern  . Not on file   Social History Narrative  . No narrative on file     Labs:  Basename 01/24/13 0411  TROPONINI <0.30   Component Value Date   WBC 8.0 01/24/2013   HGB 10.1* 01/24/2013   HCT 31.3* 01/24/2013   MCV 73.6* 01/24/2013   PLT 146* 01/24/2013    Lab 01/24/13 0400  NA 138  K 3.8  CL 105  CO2 20  BUN 27*  CREATININE 3.12*  CALCIUM 8.2*  GLUCOSE 119*    Radiology/Studies:   01/23/2013 - CT HEAD WITHOUT CONTRAST   Findings: There is no evidence of acute infarction, mass lesion, or intra- or extra-axial hemorrhage on CT.  The posterior fossa, including the cerebellum, brainstem and fourth ventricle, is within normal limits.  The third and lateral ventricles, and basal ganglia are unremarkable in appearance.  The cerebral hemispheres are symmetric  in appearance, with normal gray- white differentiation.  No mass effect or midline shift is seen.  There is no evidence of fracture; visualized osseous structures are unremarkable in appearance.  Exophthalmos is noted; the orbits are otherwise grossly unremarkable.  The paranasal sinuses and mastoid air cells are well-aerated.  Mild scattered soft tissue air along the frontal calvarium may reflect mild soft tissue injury.  IMPRESSION:  1.  No acute intracranial pathology seen on CT. 2.  Mild scattered soft tissue air along the frontal calvarium may reflect mild soft tissue injury. 3.  Exophthalmos noted.     01/23/2013 - PORTABLE CHEST - 1 VIEW   Findings: The right IJ line is noted ending about  the proximal to mid SVC.  The right PICC appears to end within the right atrium, though was noted about the cavoatrial junction on prior studies.  The lungs are well-aerated and clear.  There is no evidence of focal opacification, pleural effusion or pneumothorax.  The cardiomediastinal silhouette is borderline enlarged.  No acute osseous abnormalities are seen.  IMPRESSION:  1.  New right IJ line noted ending about the proximal to mid SVC. 2.  The previously placed right PICC appears to end within the right atrium; this should be retracted approximately 3 cm to the cavoatrial junction, when and as deemed clinically appropriate. 3.  No acute cardiopulmonary process seen; borderline cardiomegaly.     01/23/2013 - PORTABLE CHEST - 1 VIEW Findings: Lung expansion is mildly decreased, though still within normal limits.  There is no evidence of focal opacification, pleural effusion or pneumothorax.  The cardiomediastinal silhouette is borderline enlarged.  No acute osseous abnormalities are seen.  The patient's right PICC is noted ending about the distal SVC.  IMPRESSION: No acute cardiopulmonary process seen.  Borderline cardiomegaly noted.      *RADIOLOGY REPORT*  Clinical Data: Syncopal episode, hypotension, elevated dilated  right ventricle on echo, evaluate for pulmonary embolism  NUCLEAR MEDICINE VENTILATION - PERFUSION LUNG SCAN  Technique: Wash-in, equilibrium, and wash-out phase ventilation  images were obtained using Xe-133 gas. Perfusion images were  obtained in multiple projections after intravenous injection of Tc-  59m MAA.  Radiopharmaceuticals: 40 mCi aerosolized Tc-DTPA and 6 mCi Tc-23m  MAA.  Comparison: Chest radiograph - earlier same day  Findings:  Review of the chest radiograph performed earlier same day  demonstrates an enlarged cardiac silhouette and worsening pulmonary  edema.  Review of the ventilatory images demonstrates a very slight  relative oligemia of the right lung in  relation to the left but is  negative for geographic area of non ventilation. Ingested  radiotracer is seen within the superior aspect of the esophagus and  stomach.  Review of the perfusion images demonstrate homogeneous distribution  of injected radiotracer without geographic segmental or  subsegmental mismatched filling defect.  IMPRESSION:  Negative for pulmonary embolism (very low probability for pulmonary  embolism).  Original Report Authenticated By: Tacey Ruiz, MD  EKG: 01/23/13 A.Flutter 56bpm  01/24/13 A.Fib 113bpm  Physical Exam: Blood pressure 134/85, pulse 94, temperature 98.3 F (36.8 C), temperature source Oral, resp. rate 18, height 6' (1.829 m), weight 257 lb 4.4 oz (116.7 kg), SpO2 96.00%. General: Well developed, male, in no acute distress. Head: Normal Neck: Supple. Negative for carotid bruits or JVD Lungs: Decreased BS right base Heart: RRR with S1 S2. No murmurs, rubs, or gallops appreciated. Abdomen: Soft, no pain to palpation, non-distended with normoactive bowel sounds. No hepatomegaly. No rebound/guarding. Msk:  Strength and tone appear normal for age. Extremities:  No clubbing or cyanosis. No edmea  Neuro: Alert and oriented X 3. Moves all extremities spontaneously. Psych:  Responds to questions appropriately with a normal affect.   Assessment and Plan:  47 y.o. 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  1. Syncope 2/2 medication induced hypotension/shock. V/Q scan is normal. 2. Atrial Flutter/Fibrillation w/ RVR, rate controlled now on metoprolol and verapamil. 3. Acute Renal Failure- slowly improving 4. Microcytic Anemia 5. Septic Arthritis of Left Knee  6. Hyperthyroidism- new 7. Hypertension 8. H/o DVT/PE on coumadin, INR therapeutic 9.  Hyperlipidemia 10. Diabetes mellitus, Type 2 11. Abdominal pain- improved.    Plan/discussion  Overall  improved. Has paroxysmal afib but rate now controlled. BP stable. Renal dysfunction likely due to hypotension and ATN. Urine output remains good though. Creatinine a little better today.   Would continue current rate controlling medications and coumadin used as an outpatient. He was tentatively scheduled to  see Dr. Johney Frame as an outpatient (Feb 3) for possible atrial fibrillation ablation. Clearly with his recent illness and newly diagnosed hyperthyroidism this consideration will need to be postponed. We will follow closely as an outpatient and see how his rhythm responds to treatment of these conditions.   Theron Arista [facility] 01/27/2013 8:41 AM

## 2013-01-27 NOTE — Telephone Encounter (Signed)
i called, and we discussed

## 2013-01-27 NOTE — Progress Notes (Addendum)
ANTICOAGULATION CONSULT NOTE - Follow Up Consult  Pharmacy Consult for Coumadin Indication: hx DVT/PE, atrial fibrillation  Allergies  Allergen Reactions  . Metformin     REACTION: nausea/diarrhea    Patient Measurements: Height: 6' (182.9 cm) Weight: 257 lb 4.4 oz (116.7 kg) IBW/kg (Calculated) : 77.6   Vital Signs: Temp: 98.3 F (36.8 C) (01/30 0559) BP: 134/85 mmHg (01/30 0559) Pulse Rate: 94  (01/30 0559)  Labs:  Basename 01/27/13 0433 01/26/13 0545 01/25/13 0509  HGB 9.7* 10.3* --  HCT 29.9* 31.9* 32.3*  PLT 123* 128* 127*  APTT -- -- --  LABPROT 26.9* 32.4* 38.1*  INR 2.64* 3.40* 4.23*  HEPARINUNFRC -- -- --  CREATININE 2.95* 3.03* 3.22*  CKTOTAL -- -- --  CKMB -- -- --  TROPONINI -- -- --    Estimated Creatinine Clearance: 40.8 ml/min (by C-G formula based on Cr of 2.95).  Assessment: 47 y/o male on chronic Coumadin for hx DVT/PE and Afib. INR is down to therapeutic range today. No bleeding noted, H/H are stable.  Goal of Therapy:  INR 2-3 Monitor platelets by anticoagulation protocol: Yes   Plan:   1. Coumadin 5mg  PO x1 2. Cont daily INR 3. If send home, try coumadin 5mg  qday except 7.5mg  MWF

## 2013-01-27 NOTE — Progress Notes (Signed)
Critical vanc trough. 26.1. Pharmacy notified. Holding vanc at this time.

## 2013-01-27 NOTE — Progress Notes (Signed)
Reviewed discharge instructions with patient and wife, they stated their understanding.  Unable to make followup appointment due to MD office closed for lunch.  Instructed patient and wife to make followup appointment asap.  Patient discharged home with wife.  Colman Cater

## 2013-01-27 NOTE — Progress Notes (Signed)
Antibiotic  CONSULT NOTE - F/U Consult  Pharmacy Consult for , Vancomycin Indication:  HX Septic Joint  Allergies  Allergen Reactions  . Metformin     REACTION: nausea/diarrhea    Patient Measurements: Height: 6' (182.9 cm) Weight: 252 lb 14.4 oz (114.715 kg) IBW/kg (Calculated) : 77.6   Vital Signs: Temp: 98 F (36.7 C) (01/29 2142) BP: 127/74 mmHg (01/29 2142) Pulse Rate: 60  (01/29 2142)  Labs:  Basename 01/27/13 0433 01/26/13 0545 01/25/13 0509  HGB 9.7* 10.3* --  HCT 29.9* 31.9* 32.3*  PLT 123* 128* 127*  APTT -- -- --  LABPROT 26.9* 32.4* 38.1*  INR 2.64* 3.40* 4.23*  HEPARINUNFRC -- -- --  CREATININE 2.95* 3.03* 3.22*  CKTOTAL -- -- --  CKMB -- -- --  TROPONINI -- -- --    Estimated Creatinine Clearance: 40.5 ml/min (by C-G formula based on Cr of 2.95).   Medical History: Past Medical History  Diagnosis Date  . DM 08/08/2008  . DYSLIPIDEMIA 04/26/2009  . GOUT 10/24/2010  . HYPERTENSION 07/21/2007  . PULMONARY EMBOLISM 05/03/2009  . DEEP VENOUS THROMBOPHLEBITIS, LEG, LEFT 05/05/2009  . ALLERGIC RHINITIS 04/12/2009  . UNSPECIFIED URINARY CALCULUS 07/04/2010  . 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 ECZEMA 05/23/2010  . Long term (current) use of anticoagulants 04/03/2011  . BACK PAIN, LUMBAR 01/14/2011  . 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 Hyperglycemia   . Leukopenia   . Atrial fibrillation   . GERD (gastroesophageal reflux disease)     Assessment: vanc trough this am on 1500mg  q24 is 26.1 srcr remain stable although elevated at 2.95.   Goal of Therapy:   Vancomycin trough 15-20 mcg/ml   Plan:  1) decrease to 1250mg  q24hours and recheck in 3-4 days.   Next dose at noon.

## 2013-01-27 NOTE — Discharge Summary (Signed)
Physician Discharge Summary  Spencer English ZOX:[id number] DOB: 07-07-1966 DOA: 01/22/2013  PCP: Romero Belling, MD  Admit date: 01/22/2013 Discharge date: 01/27/2013  Time spent: 35 minutes  Recommendations for Outpatient Follow-up:  1. Needs CBC and basic metabolic profile at followup 2. Has been started on Tapazole for newly diagnosed hyperthyroidism  Discharge Diagnoses:  Hypotension due to drugs - requiring pressors - suspected related to Cialis  DM  Atrial fibrillation  History of septic arthritis - completed course of IV antibiotic  Syncope  Acute renal failure - improved-creatinine at 2.9 on discharge   Microcytic anemia   Thrombocytopenia  Moderate to severe pulmonary hypertension  Newly diagnosed Hyperthyroidism - probable Graves' disease   Discharge Condition: good  Diet recommendation: diabetic   Filed Weights   01/24/13 0500 01/25/13 0505 01/27/13 0559  Weight: 115.3 kg (254 lb 3.1 oz) 114.715 kg (252 lb 14.4 oz) 116.7 kg (257 lb 4.4 oz)    History of present illness:  Mr. Bouffard is a 47 year old gentleman with a past medical history significant for atrial fibrillation, diabetes, and hypertension with normal EF, who presents to the ED after a syncopal episode complicated by probable seizure secondary to excessive use of Cialis. Blood pressure on arrival of EMS was 80/60. He was brought to the emergency department a central line has been placed and he was started on norepinephrine after administration of 4 L of crystalloid.    Hospital Course:  1. Syncope/Hypotension: Resolved. Secondary to Cialis. Required vasopressors initially. It resolved and did not recur 2. Acute renal failure: Likely secondary to hypotension-induced ATN. Peak creatinine 3.2 . Sediment was not active . abdominal ultrasound was negative for hydronephrosis . The patient maintained Excellent urine output throughout admission. Potassium normal. Creatinine down to 2.9 at the time of discharge . We  discontinued the angiotensin receptor blocker at the time of the discharge 3. Pulmonary edema? Seen on chest x-ray, however clinically appears quite stable without signs of pulmonary compromise. 4. Seizure: Likely related to hypotension. No recurrence monitor.  5. Microcytic anemia: Stable. Needs outpatient followup and referral to gastroenterology for endoscopy and colonoscopy if anemia remains persistent. There is a possibility that the patient's anemia is related to his newly diagnosed hyperthyroidism  6. Thrombocytopenia: Etiology unclear.has not received heparin or enoxaparin. Platelet count improved prior to discharge. If persistent will require referral to hematology.  7. History of septic arthritis: The patient received vancomycin and ceftriaxone.  Intravenously during the admission. We discussed with Dr. Ninetta Lights and he agreed with stopping antibiotics at the time of the discharge. 8. Atrial fibrillation:  Continue verapamil metoprolol. Is to see Dr. Johney Frame as an outpatient (Feb 3) for further recommendations concerning his atrial arrhythmias. Continued on full dose Coumadin trial to admission 9. Diabetes: continue Tradjenta.  10. Hypertension: Stable. 11. RUQ abd pain - ? Etiology - Abd [place] negative for gallstones - improved -  12. Newly diagnosed hyperthyroidism - patient was started on Tapazole 40 mg twice a day at the recommendation of his primary care physician 13.      Procedures: None   Consultations:  Cardiology   Discharge Exam: Filed Vitals:   01/26/13 1009 01/26/13 1400 01/26/13 2142 01/27/13 0559  BP: 133/81 153/95 127/74 134/85  Pulse: 62 110 60 94  Temp:  97.6 F (36.4 C) 98 F (36.7 C) 98.3 F (36.8 C)  TempSrc:      Resp:  18 18 18   Height:      Weight:    116.7 kg (  257 lb 4.4 oz)  SpO2:  98% 97% 96%    General: axox3 Cardiovascular: irreg irreg  Respiratory: ctab , no w,r,c   Discharge Instructions     Medication List     As of 01/27/2013 10:16  AM    STOP taking these medications         dextrose 5 % SOLN 50 mL with cefTRIAXone 1 G SOLR 1 g      furosemide 40 MG tablet   Commonly known as: LASIX      olmesartan 40 MG tablet   Commonly known as: BENICAR      sodium chloride 0.9 % SOLN 250 mL with vancomycin 10 G SOLR 1,250 mg      vancomycin 1 GM/200ML Soln   Commonly known as: VANCOCIN      TAKE these medications         acetaminophen 325 MG tablet   Commonly known as: TYLENOL   Take 2 tablets (650 mg total) by mouth every 6 (six) hours as needed for pain.      allopurinol 300 MG tablet   Commonly known as: ZYLOPRIM   Take 1 tablet (300 mg total) by mouth as needed. For gout flare-up.      atorvastatin 20 MG tablet   Commonly known as: LIPITOR   Take 1 tablet (20 mg total) by mouth daily.      fexofenadine 180 MG tablet   Commonly known as: ALLEGRA   Take 180 mg by mouth daily as needed. For allergies.      fluticasone 50 MCG/ACT nasal spray   Commonly known as: FLONASE   Place 2 sprays into the nose daily as needed. For allergies.      halobetasol 0.05 % cream   Commonly known as: ULTRAVATE   APPLY  CREAM TOPICALLY THREE TIMES DAILY AS NEEDED FOR RASH      methimazole 10 MG tablet   Commonly known as: TAPAZOLE   Take 4 tablets (40 mg total) by mouth 2 (two) times daily.      metoprolol tartrate 25 MG tablet   Commonly known as: LOPRESSOR   Take 1 tablet (25 mg total) by mouth 2 (two) times daily.      oxyCODONE-acetaminophen 5-325 MG per tablet   Commonly known as: PERCOCET/ROXICET   1-2 tabs po q4-6hrs prn pain      pantoprazole 40 MG tablet   Commonly known as: PROTONIX   Take 1 tablet (40 mg total) by mouth daily.      sitaGLIPtin 100 MG tablet   Commonly known as: JANUVIA   Take 1 tablet (100 mg total) by mouth daily.      sodium chloride 0.9 % injection   10-40 mLs by Intracatheter route as needed (flush).      verapamil 240 MG (CO) 24 hr tablet   Commonly known as: COVERA HS   Take  240 mg by mouth at bedtime.      warfarin 5 MG tablet   Commonly known as: COUMADIN   Take 7.5 mg by mouth daily.           Follow-up Information    Schedule an appointment as soon as possible for a visit with Romero Belling, MD.   Contact information:   [address] [place] [address] [phone]           The results of significant diagnostics from this hospitalization (including imaging, microbiology, ancillary and laboratory) are listed below for  reference.    Significant Diagnostic Studies: Ct Head Wo Contrast  01/23/2013  *RADIOLOGY REPORT*  Clinical Data: Syncope; dizziness.  CT HEAD WITHOUT CONTRAST  Technique:  Contiguous axial images were obtained from the base of the skull through the vertex without contrast.  Comparison: CT of the head performed 04/01/2012  Findings: There is no evidence of acute infarction, mass lesion, or intra- or extra-axial hemorrhage on CT.  The posterior fossa, including the cerebellum, brainstem and fourth ventricle, is within normal limits.  The third and lateral ventricles, and basal ganglia are unremarkable in appearance.  The cerebral hemispheres are symmetric in appearance, with normal gray- white differentiation.  No mass effect or midline shift is seen.  There is no evidence of fracture; visualized osseous structures are unremarkable in appearance.  Exophthalmos is noted; the orbits are otherwise grossly unremarkable.  The paranasal sinuses and mastoid air cells are well-aerated.  Mild scattered soft tissue air along the frontal calvarium may reflect mild soft tissue injury.  IMPRESSION:  1.  No acute intracranial pathology seen on CT. 2.  Mild scattered soft tissue air along the frontal calvarium may reflect mild soft tissue injury. 3.  Exophthalmos noted.   Original Report Authenticated By: Tonia Ghent, M.D.    US Abdomen Complete  01/26/2013  Ultrasound of the abdomen.  The liver is normal in size and echotexture with  normal hepatic portal venous flow.  The gallbladder is not distended.  There are no stones and there is no tenderness.  Its wall is slightly thickened and appears slightly edematous.  Wall thickness is 5.1 mm.  There is no intrahepatic biliary dilatation.  The common duct is normal with a diameter 2.1 mm.  The pancreas appears normal in size bilious body is well seen.  The aorta and inferior vena cava appear normal. The maximum diameter of the aorta is 2.4 cm.  Both kidneys appear somewhat echogenic.  The right kidney is 13.0 cm in length.  Left kidney is 13.4 cm in length.  There is a 1.9 cm nonobstructing stone in the mid to lower pole of the left kidney. There are no renal masses. There is no hydronephrosis.  The spleen is normal with a height of 6.4 cm.  Impression: No definite acute findings.  No gallstones are seen. The wall the gallbladder is slightly thickened and may be edematous.  This can be due to liver disease or hypoalbuminemia states.  It coated also be due to acalculous cholecystitis but there are no additional findings to support that possibility. Echotexture of both kidneys is increased indicating medical renal disease.  Non - obstructing lower pole stone in the left kidney.   Original Report Authenticated By: Sander Radon, M.D.    Nm Pulmonary Perf And Vent  01/25/2013  *RADIOLOGY REPORT*  Clinical Data:  Syncopal episode, hypotension, elevated dilated right ventricle on echo, evaluate for pulmonary embolism  NUCLEAR MEDICINE VENTILATION - PERFUSION LUNG SCAN  Technique:  Wash-in, equilibrium, and wash-out phase ventilation images were obtained using Xe-133 gas.  Perfusion images were obtained in multiple projections after intravenous injection of Tc- 75m MAA.  Radiopharmaceuticals:  40 mCi aerosolized Tc-DTPA and 6 mCi Tc-35m MAA.  Comparison:  Chest radiograph - earlier same day  Findings:  Review of the chest radiograph performed earlier same day demonstrates an enlarged cardiac  silhouette and worsening pulmonary edema.  Review of the ventilatory images demonstrates a very slight relative oligemia of the right lung in relation to the left but is negative  for geographic area of non ventilation. Ingested radiotracer is seen within the superior aspect of the esophagus and stomach.  Review of the perfusion images demonstrate homogeneous distribution of injected radiotracer without geographic segmental or subsegmental mismatched filling defect.  IMPRESSION: Negative for pulmonary embolism (very low probability for pulmonary embolism).   Original Report Authenticated By: Tacey Ruiz, MD    Dg Chest Port 1 View  01/25/2013  *RADIOLOGY REPORT*  Clinical Data: Shortness of breath, evaluate for fluid overload  PORTABLE CHEST - 1 VIEW  Comparison: 01/23/2013; 01/07/2013; 01/03/2039  Findings:  Grossly unchanged enlarged cardiac silhouette and mediastinal contours.  Likely interval retraction of right upper extremity approach PICC line with tip now projects in the superior atrial junction.  Stable positioning of right jugular approach central venous catheter.  The pulmonary vasculature is less distinct with cephalization of flow.  Worsening bibasilar opacities.  Query trace right-sided effusion.  No pneumothorax.  Unchanged bones.  IMPRESSION: 1. Interval retraction of right upper extremity approach PICC line with tip now overlying the superior cavoatrial junction. 2.  Findings suggestive of worsening pulmonary edema with possible trace right-sided effusion.   Original Report Authenticated By: Tacey Ruiz, MD    Dg Chest Portable 1 View  01/23/2013  *RADIOLOGY REPORT*  Clinical Data: Central line placement.  PORTABLE CHEST - 1 VIEW  Comparison: Chest radiograph performed earlier today at 01:00 a.m.  Findings: The right IJ line is noted ending about the proximal to mid SVC.  The right PICC appears to end within the right atrium, though was noted about the cavoatrial junction on prior studies.   The lungs are well-aerated and clear.  There is no evidence of focal opacification, pleural effusion or pneumothorax.  The cardiomediastinal silhouette is borderline enlarged.  No acute osseous abnormalities are seen.  IMPRESSION:  1.  New right IJ line noted ending about the proximal to mid SVC. 2.  The previously placed right PICC appears to end within the right atrium; this should be retracted approximately 3 cm to the cavoatrial junction, when and as deemed clinically appropriate. 3.  No acute cardiopulmonary process seen; borderline cardiomegaly.   Original Report Authenticated By: Tonia Ghent, M.D.    Dg Chest Port 1 View  01/23/2013  *RADIOLOGY REPORT*  Clinical Data: Weakness and hypotension.  PORTABLE CHEST - 1 VIEW  Comparison: Chest radiograph performed 01/07/2013  Findings: Lung expansion is mildly decreased, though still within normal limits.  There is no evidence of focal opacification, pleural effusion or pneumothorax.  The cardiomediastinal silhouette is borderline enlarged.  No acute osseous abnormalities are seen.  The patient's right PICC is noted ending about the distal SVC.  IMPRESSION: No acute cardiopulmonary process seen.  Borderline cardiomegaly noted.   Original Report Authenticated By: Tonia Ghent, M.D.    Dg Chest Port 1 View  01/07/2013  *RADIOLOGY REPORT*  Clinical Data: Bedside PICC placement.  PORTABLE CHEST - 1 VIEW  Comparison: Portable chest x-ray 01/03/2013.  Findings: Right arm PICC tip projects over the lower SVC.  Cardiac silhouette upper normal in size for the AP portable technique, unchanged.  Interval resolution of the previously described left basilar retrocardiac opacity.  Lungs now clear.  IMPRESSION:  1.  Right arm PICC tip projects over the lower SVC. 2.  No acute cardiopulmonary disease.   Original Report Authenticated By: Hulan Saas, M.D.    Dg Chest Portable 1 View  01/03/2013  *RADIOLOGY REPORT*  Clinical Data: 47 year old male preoperative study.   History of  pulmonary embolus in 2010.  PORTABLE CHEST - 1 VIEW  Comparison: 09/19/2012 chest CTA and earlier.  Findings: AP view at 1222 hours.  Mildly lower lung volumes. Cardiac size and mediastinal contours are within normal limits. Visualized tracheal air column is within normal limits.  No pneumothorax or pulmonary edema.  No pleural effusion.  Mildly increased retrocardiac opacity on the left.  IMPRESSION: Mildly increased retrocardiac opacity on the left.  Favor atelectasis, but upright lateral view would be valuable to exclude consolidation/pneumonia if possible.   Original Report Authenticated By: Erskine Speed, M.D.    Dg Knee Complete 4 Views Left  01/03/2013  *RADIOLOGY REPORT*  Clinical Data: Knee pain and swelling.  History of gout.  LEFT KNEE - COMPLETE 4+ VIEW  Comparison: None.  Findings: There is no fracture or dislocation.  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  Small joint effusion is noted. Loose bodies are identified in the joint.  One of the largest measures 1.2 cm and is best seen on the lateral view.  IMPRESSION:  1.  No acute abnormality. 2.  Advance for age degenerative disease.   Original Report Authenticated By: Holley Dexter, M.D.     Microbiology: Recent Results (from the past 240 hour(s))  CULTURE, BLOOD (ROUTINE X 2)     Status: Normal (Preliminary result)   Collection Time   01/23/13  2:00 AM      Component Value Range Status Comment   Specimen Description BLOOD RIGHT ARM   Final    Special Requests BOTTLES DRAWN AEROBIC ONLY 10CC   Final    Culture  Setup Time 01/23/2013 15:01   Final    Culture     Final    Value:        BLOOD CULTURE RECEIVED NO GROWTH TO DATE CULTURE WILL BE HELD FOR 5 DAYS BEFORE ISSUING A FINAL NEGATIVE REPORT   Report Status PENDING   Incomplete   CULTURE, BLOOD (ROUTINE X 2)     Status: Normal (Preliminary result)   Collection Time   01/23/13  2:12 AM      Component Value Range Status Comment   Specimen  Description BLOOD LEFT ARM   Final    Special Requests BOTTLES DRAWN AEROBIC ONLY 8CC   Final    Culture  Setup Time 01/23/2013 15:02   Final    Culture     Final    Value:        BLOOD CULTURE RECEIVED NO GROWTH TO DATE CULTURE WILL BE HELD FOR 5 DAYS BEFORE ISSUING A FINAL NEGATIVE REPORT   Report Status PENDING   Incomplete   MRSA PCR SCREENING     Status: Normal   Collection Time   01/23/13  3:43 AM      Component Value Range Status Comment   MRSA by PCR NEGATIVE  NEGATIVE Final      Labs: Basic Metabolic Panel:  Lab 01/27/13 4540 01/26/13 0545 01/25/13 0509 01/24/13 0400 01/23/13 1600 01/23/13 0500  NA 139 139 138 138 138 --  K 4.0 4.1 3.8 3.8 3.9 --  CL 106 106 106 105 105 --  CO2 21 21 19 20 20  --  GLUCOSE 122* 144* 128* 119* 108* --  BUN 26* 24* 28* 27* 25* --  CREATININE 2.95* 3.03* 3.22* 3.12* 2.79* --  CALCIUM 8.7 8.8 8.9 8.2* 8.2* --  MG -- -- -- 1.9 -- 1.5  PHOS -- -- -- 5.1* -- 4.9*   Liver Function Tests:  Lab 01/27/13 0433  AST 63*  ALT 366*  ALKPHOS 130*  BILITOT 1.1  PROT 6.1  ALBUMIN 2.7*   No results found for this basename: LIPASE:5,AMYLASE:5 in the last 168 hours No results found for this basename: AMMONIA:5 in the last 168 hours CBC:  Lab 01/27/13 0433 01/26/13 0545 01/25/13 0509 01/24/13 0400 01/23/13 0500 01/23/13 0055  WBC 8.3 9.3 7.3 8.0 11.3* --  NEUTROABS 5.8 -- -- -- -- 5.0  HGB 9.7* 10.3* 10.4* 10.1* 11.1* --  HCT 29.9* 31.9* 32.3* 31.3* 35.4* --  MCV 72.7* 73.3* 72.6* 73.6* 75.8* --  PLT 123* 128* 127* 146* 214 --   Cardiac Enzymes:  Lab 01/24/13 0411  CKTOTAL --  CKMB --  CKMBINDEX --  TROPONINI <0.30   BNP: BNP (last 3 results) No results found for this basename: PROBNP:3 in the last 8760 hours CBG:  Lab 01/27/13 0733 01/26/13 2105 01/26/13 1649 01/26/13 1130 01/26/13 0731  GLUCAP 151* 131* 185* 117* 134*       Signed:  Verdella Laidlaw  [facility] 01/27/2013, 10:16 AM

## 2013-01-27 NOTE — Progress Notes (Signed)
NUTRITION FOLLOW UP  Intervention:    D/C Glucerna Shake supplement RD to follow for nutrition care plan  Nutrition Dx:   Inadequate oral intake related to decreased appetite as evidenced by poor intake of meals, ongoing  Goal:   Oral intake with meals & supplements to meet >/= 90% of estimated nutrition needs, unmet  Monitor:   PO & supplemental intake, weight, labs, I/O's  Assessment:   Patients states his appetite is OK.  PO intake 0-50% per flowsheet records.  Reports Glucerna Shakes "made me sick".  Declining to try other supplements.  Discharging today.  Height: Ht Readings from Last 1 Encounters:  01/23/13 6' (1.829 m)    Weight Status:   Wt Readings from Last 1 Encounters:  01/27/13 257 lb 4.4 oz (116.7 kg)    Re-estimated needs:  Kcal: 2400 Protein: 120-140 gm Fluid: 2.4 L  Skin: Intact  Diet Order: Carb Control   Intake/Output Summary (Last 24 hours) at 01/27/13 1030 Last data filed at 01/26/13 2200  Gross per 24 hour  Intake      0 ml  Output    500 ml  Net   -500 ml    Last BM: 1/29  Labs:   Lab 01/27/13 0433 01/26/13 0545 01/25/13 0509 01/24/13 0400 01/23/13 0500  NA 139 139 138 -- --  K 4.0 4.1 3.8 -- --  CL 106 106 106 -- --  CO2 21 21 19  -- --  BUN 26* 24* 28* -- --  CREATININE 2.95* 3.03* 3.22* -- --  CALCIUM 8.7 8.8 8.9 -- --  MG -- -- -- 1.9 1.5  PHOS -- -- -- 5.1* 4.9*  GLUCOSE 122* 144* 128* -- --    CBG (last 3)   Basename 01/27/13 0733 01/26/13 2105 01/26/13 1649  GLUCAP 151* 131* 185*    Scheduled Meds:   . atorvastatin  20 mg Oral q1800  . cefTRIAXone (ROCEPHIN) IVPB 1 gram/50 mL D5W  1 g Intravenous Q24H  . feeding supplement  237 mL Oral TID BM  . insulin aspart  0-9 Units Subcutaneous TID WC  . linagliptin  5 mg Oral Daily  . loratadine  10 mg Oral Daily  . methimazole  40 mg Oral BID  . metoprolol tartrate  25 mg Oral BID  . pantoprazole  40 mg Oral Q1200  . verapamil  80 mg Oral Q8H  . warfarin  5 mg Oral  ONCE-1800  . Warfarin - Pharmacist Dosing Inpatient   Does not apply q1800    Continuous Infusions:   Maureen Chatters, RD, LDN Pager #: [phone] After-Hours Pager #: [phone]

## 2013-01-28 ENCOUNTER — Telehealth: Payer: Self-pay | Admitting: Endocrinology

## 2013-01-28 NOTE — Telephone Encounter (Signed)
Please schedule

## 2013-01-28 NOTE — Telephone Encounter (Signed)
i would have to see pt in order to order oxygen

## 2013-01-28 NOTE — Telephone Encounter (Signed)
The patient's wife called to schedule hospital follow up and also stated that her husband is feeling very short of breath since release from the hospital yesterday 01/27/13.  This Clinical research associate offered the patient an appointment today and the patient's wife declined and it was scheduled for 02/01/13.  The patient's wife stated that she feels he needs oxygen at home and would like this set up.  The patient's wife was advised to seek emergency care if breathing issues worsened.  The patient's wife may be reached at [phone].

## 2013-01-29 LAB — CULTURE, BLOOD (ROUTINE X 2): Culture: NO GROWTH

## 2013-01-31 ENCOUNTER — Ambulatory Visit: Payer: BC Managed Care – PPO | Admitting: Internal Medicine

## 2013-01-31 ENCOUNTER — Inpatient Hospital Stay (HOSPITAL_COMMUNITY)
Admission: EM | Admit: 2013-01-31 | Discharge: 2013-02-04 | DRG: 544 | Disposition: A | Discharge status: Home Health | Payer: BC Managed Care – PPO | Attending: Internal Medicine | Admitting: Internal Medicine

## 2013-01-31 ENCOUNTER — Encounter (HOSPITAL_COMMUNITY): Payer: Self-pay | Admitting: Emergency Medicine

## 2013-01-31 ENCOUNTER — Emergency Department (HOSPITAL_COMMUNITY): Payer: BC Managed Care – PPO

## 2013-01-31 DIAGNOSIS — Z87891 Personal history of nicotine dependence: Secondary | ICD-10-CM

## 2013-01-31 DIAGNOSIS — I959 Hypotension, unspecified: Secondary | ICD-10-CM | POA: Diagnosis present

## 2013-01-31 DIAGNOSIS — I4891 Unspecified atrial fibrillation: Secondary | ICD-10-CM | POA: Diagnosis present

## 2013-01-31 DIAGNOSIS — L723 Sebaceous cyst: Secondary | ICD-10-CM

## 2013-01-31 DIAGNOSIS — I2782 Chronic pulmonary embolism: Secondary | ICD-10-CM | POA: Diagnosis present

## 2013-01-31 DIAGNOSIS — I10 Essential (primary) hypertension: Secondary | ICD-10-CM

## 2013-01-31 DIAGNOSIS — N170 Acute kidney failure with tubular necrosis: Secondary | ICD-10-CM | POA: Diagnosis present

## 2013-01-31 DIAGNOSIS — I82409 Acute embolism and thrombosis of unspecified deep veins of unspecified lower extremity: Secondary | ICD-10-CM

## 2013-01-31 DIAGNOSIS — J811 Chronic pulmonary edema: Secondary | ICD-10-CM

## 2013-01-31 DIAGNOSIS — D696 Thrombocytopenia, unspecified: Secondary | ICD-10-CM | POA: Diagnosis present

## 2013-01-31 DIAGNOSIS — I272 Pulmonary hypertension, unspecified: Secondary | ICD-10-CM

## 2013-01-31 DIAGNOSIS — D509 Iron deficiency anemia, unspecified: Secondary | ICD-10-CM

## 2013-01-31 DIAGNOSIS — T502X5A Adverse effect of carbonic-anhydrase inhibitors, benzothiadiazides and other diuretics, initial encounter: Secondary | ICD-10-CM | POA: Diagnosis present

## 2013-01-31 DIAGNOSIS — R55 Syncope and collapse: Secondary | ICD-10-CM

## 2013-01-31 DIAGNOSIS — I509 Heart failure, unspecified: Secondary | ICD-10-CM | POA: Diagnosis present

## 2013-01-31 DIAGNOSIS — Z888 Allergy status to other drugs, medicaments and biological substances status: Secondary | ICD-10-CM

## 2013-01-31 DIAGNOSIS — Z86718 Personal history of other venous thrombosis and embolism: Secondary | ICD-10-CM

## 2013-01-31 DIAGNOSIS — Z7901 Long term (current) use of anticoagulants: Secondary | ICD-10-CM

## 2013-01-31 DIAGNOSIS — M109 Gout, unspecified: Secondary | ICD-10-CM

## 2013-01-31 DIAGNOSIS — R05 Cough: Secondary | ICD-10-CM

## 2013-01-31 DIAGNOSIS — E1169 Type 2 diabetes mellitus with other specified complication: Secondary | ICD-10-CM | POA: Diagnosis present

## 2013-01-31 DIAGNOSIS — E119 Type 2 diabetes mellitus without complications: Secondary | ICD-10-CM | POA: Diagnosis present

## 2013-01-31 DIAGNOSIS — I5033 Acute on chronic diastolic (congestive) heart failure: Principal | ICD-10-CM | POA: Diagnosis present

## 2013-01-31 DIAGNOSIS — E785 Hyperlipidemia, unspecified: Secondary | ICD-10-CM | POA: Diagnosis present

## 2013-01-31 DIAGNOSIS — R002 Palpitations: Secondary | ICD-10-CM

## 2013-01-31 DIAGNOSIS — N179 Acute kidney failure, unspecified: Secondary | ICD-10-CM

## 2013-01-31 DIAGNOSIS — Z86711 Personal history of pulmonary embolism: Secondary | ICD-10-CM

## 2013-01-31 DIAGNOSIS — Z8739 Personal history of other diseases of the musculoskeletal system and connective tissue: Secondary | ICD-10-CM

## 2013-01-31 DIAGNOSIS — E876 Hypokalemia: Secondary | ICD-10-CM | POA: Diagnosis not present

## 2013-01-31 DIAGNOSIS — Z79899 Other long term (current) drug therapy: Secondary | ICD-10-CM

## 2013-01-31 DIAGNOSIS — I2699 Other pulmonary embolism without acute cor pulmonale: Secondary | ICD-10-CM

## 2013-01-31 DIAGNOSIS — R609 Edema, unspecified: Secondary | ICD-10-CM

## 2013-01-31 DIAGNOSIS — N529 Male erectile dysfunction, unspecified: Secondary | ICD-10-CM

## 2013-01-31 DIAGNOSIS — K219 Gastro-esophageal reflux disease without esophagitis: Secondary | ICD-10-CM | POA: Diagnosis present

## 2013-01-31 DIAGNOSIS — M79609 Pain in unspecified limb: Secondary | ICD-10-CM

## 2013-01-31 DIAGNOSIS — Z119 Encounter for screening for infectious and parasitic diseases, unspecified: Secondary | ICD-10-CM

## 2013-01-31 DIAGNOSIS — J309 Allergic rhinitis, unspecified: Secondary | ICD-10-CM | POA: Diagnosis present

## 2013-01-31 DIAGNOSIS — Z87440 Personal history of urinary (tract) infections: Secondary | ICD-10-CM

## 2013-01-31 DIAGNOSIS — E118 Type 2 diabetes mellitus with unspecified complications: Secondary | ICD-10-CM | POA: Diagnosis present

## 2013-01-31 DIAGNOSIS — R0602 Shortness of breath: Secondary | ICD-10-CM

## 2013-01-31 DIAGNOSIS — M545 Low back pain, unspecified: Secondary | ICD-10-CM

## 2013-01-31 DIAGNOSIS — M25579 Pain in unspecified ankle and joints of unspecified foot: Secondary | ICD-10-CM

## 2013-01-31 DIAGNOSIS — R079 Chest pain, unspecified: Secondary | ICD-10-CM

## 2013-01-31 DIAGNOSIS — R945 Abnormal results of liver function studies: Secondary | ICD-10-CM | POA: Diagnosis present

## 2013-01-31 DIAGNOSIS — N209 Urinary calculus, unspecified: Secondary | ICD-10-CM

## 2013-01-31 DIAGNOSIS — R7989 Other specified abnormal findings of blood chemistry: Secondary | ICD-10-CM

## 2013-01-31 DIAGNOSIS — L259 Unspecified contact dermatitis, unspecified cause: Secondary | ICD-10-CM

## 2013-01-31 DIAGNOSIS — R062 Wheezing: Secondary | ICD-10-CM

## 2013-01-31 DIAGNOSIS — I952 Hypotension due to drugs: Secondary | ICD-10-CM

## 2013-01-31 DIAGNOSIS — Z021 Encounter for pre-employment examination: Secondary | ICD-10-CM

## 2013-01-31 DIAGNOSIS — R059 Cough, unspecified: Secondary | ICD-10-CM

## 2013-01-31 DIAGNOSIS — M5430 Sciatica, unspecified side: Secondary | ICD-10-CM | POA: Diagnosis present

## 2013-01-31 DIAGNOSIS — E0590 Thyrotoxicosis, unspecified without thyrotoxic crisis or storm: Secondary | ICD-10-CM

## 2013-01-31 DIAGNOSIS — D649 Anemia, unspecified: Secondary | ICD-10-CM | POA: Diagnosis present

## 2013-01-31 DIAGNOSIS — I2789 Other specified pulmonary heart diseases: Secondary | ICD-10-CM | POA: Diagnosis present

## 2013-01-31 LAB — CBC WITH DIFFERENTIAL/PLATELET
Basophils Absolute: 0.1 10*3/uL (ref 0.0–0.1)
HCT: 35.9 % — ABNORMAL LOW (ref 39.0–52.0)
Hemoglobin: 11.7 g/dL — ABNORMAL LOW (ref 13.0–17.0)
Lymphocytes Relative: 17 % (ref 12–46)
Monocytes Absolute: 0.9 10*3/uL (ref 0.1–1.0)
Monocytes Relative: 10 % (ref 3–12)
Neutro Abs: 6 10*3/uL (ref 1.7–7.7)
RBC: 4.9 MIL/uL (ref 4.22–5.81)
RDW: 16 % — ABNORMAL HIGH (ref 11.5–15.5)
WBC: 8.6 10*3/uL (ref 4.0–10.5)

## 2013-01-31 LAB — COMPREHENSIVE METABOLIC PANEL
AST: 34 U/L (ref 0–37)
CO2: 24 mEq/L (ref 19–32)
Chloride: 105 mEq/L (ref 96–112)
Creatinine, Ser: 2.04 mg/dL — ABNORMAL HIGH (ref 0.50–1.35)
GFR calc non Af Amer: 37 mL/min — ABNORMAL LOW (ref >90)
Total Bilirubin: 1.3 mg/dL — ABNORMAL HIGH (ref 0.3–1.2)

## 2013-01-31 LAB — URINALYSIS, ROUTINE W REFLEX MICROSCOPIC
Bilirubin Urine: NEGATIVE
Hgb urine dipstick: NEGATIVE
Ketones, ur: NEGATIVE mg/dL
Protein, ur: NEGATIVE mg/dL
Urobilinogen, UA: 0.2 mg/dL (ref 0.0–1.0)

## 2013-01-31 LAB — URINE MICROSCOPIC-ADD ON

## 2013-01-31 LAB — POCT I-STAT TROPONIN I: Troponin i, poc: 0 ng/mL (ref 0.00–0.08)

## 2013-01-31 LAB — PROTIME-INR: Prothrombin Time: 29.6 seconds — ABNORMAL HIGH (ref 11.6–15.2)

## 2013-01-31 MED ORDER — ACETAMINOPHEN 325 MG PO TABS
650.0000 mg | ORAL_TABLET | Freq: Once | ORAL | Status: AC
  Administered 2013-01-31: 650 mg via ORAL
  Filled 2013-01-31: qty 2

## 2013-01-31 MED ORDER — FUROSEMIDE 10 MG/ML IJ SOLN
20.0000 mg | Freq: Once | INTRAMUSCULAR | Status: AC
  Administered 2013-01-31: 20 mg via INTRAVENOUS
  Filled 2013-01-31: qty 2

## 2013-01-31 MED ORDER — ONDANSETRON HCL 4 MG/2ML IJ SOLN
4.0000 mg | Freq: Once | INTRAMUSCULAR | Status: AC
  Administered 2013-01-31: 4 mg via INTRAVENOUS
  Filled 2013-01-31: qty 2

## 2013-01-31 NOTE — ED Notes (Signed)
Pt c/o increased SOB and generalized weakness; pt recently hospitalized; pt sts cough with min sputum

## 2013-01-31 NOTE — Telephone Encounter (Signed)
noted 

## 2013-01-31 NOTE — ED Provider Notes (Addendum)
History     CSN: [id number]  Arrival date & time 01/31/13  1704   First MD Initiated Contact with Patient 01/31/13 1740      Chief Complaint  Patient presents with  . Shortness of Breath    (Consider location/radiation/quality/duration/timing/severity/associated sxs/prior treatment) HPI Comments: 47 year old male with a history of atrial fibrillation, PE among multiple other medical problems presents to the emergency department with shortness of breath for the past 5 days. Patient was recently admitted to the hospital for hypotension and acute kidney injury and was discharged 3 days ago. Patient states he did tell the admitting doctor is about the shortness of breath, he had a ventilation/perfusion scan which did not show a PE. Shortness of breath is present at rest, worse on exertion. States it is very hard for him to walk across the room to go to the bathroom. Admits to associated nausea without vomiting. He does not have an appetite. Denies chest pain, urinary changes or calf pain. States he has been urinating well since discharge.  The history is provided by the patient.    Past Medical History  Diagnosis Date  . DM 08/08/2008  . DYSLIPIDEMIA 04/26/2009  . GOUT 10/24/2010  . HYPERTENSION 07/21/2007  . PULMONARY EMBOLISM 05/03/2009  . DEEP VENOUS THROMBOPHLEBITIS, LEG, LEFT 05/05/2009  . ALLERGIC RHINITIS 04/12/2009  . UNSPECIFIED URINARY CALCULUS 07/04/2010  . 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 ECZEMA 05/23/2010  . Long term (current) use of anticoagulants 04/03/2011  . BACK PAIN, LUMBAR 01/14/2011  . 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 Hyperglycemia   . Leukopenia   . Atrial fibrillation   . GERD (gastroesophageal reflux disease)     Past Surgical History  Procedure Date  . Anterior cruciate ligament repair 2000    Right  . Knee arthroscopy     left knee  . Knee arthroscopy w/ acl reconstruction     right knee  . Cystectomy     back of head  . Knee arthroscopy 01/03/2013     Procedure: ARTHROSCOPY KNEE;  Surgeon: Thera Flake., MD;  Location: [facility] OR;  Service: Orthopedics;  Laterality: Left;  Lavage Synovectomy, Removal of loose body    Family History  Problem Relation Age of Onset  . Heart disease Neg Hx     no premature CAD known  . Diabetes Mother   . Hypertension Mother   . Hyperlipidemia Mother   . Diabetes Father   . Hypertension Father   . Hyperlipidemia Father     History  Substance Use Topics  . Smoking status: Former Smoker    Quit date: 01/07/2005  . Smokeless tobacco: Never Used  . Alcohol Use: Yes     Comment: Occ.      Review of Systems  Constitutional: Positive for appetite change.  Respiratory: Positive for shortness of breath.   Cardiovascular: Negative for chest pain.  Gastrointestinal: Positive for nausea. Negative for vomiting.  Musculoskeletal: Negative for myalgias and arthralgias.  All other systems reviewed and are negative.    Allergies  Metformin  Home Medications   Current Outpatient Rx  Name  Route  Sig  Dispense  Refill  . ACETAMINOPHEN 325 MG PO TABS   Oral   Take 2 tablets (650 mg total) by mouth every 6 (six) hours as needed for pain.         Marland Kitchen ALLOPURINOL 300 MG PO TABS   Oral   Take 1 tablet (300 mg total) by mouth as needed. For gout flare-up.  30 tablet   11   . ATORVASTATIN CALCIUM 20 MG PO TABS   Oral   Take 1 tablet (20 mg total) by mouth daily.   90 tablet   3   . FEXOFENADINE HCL 180 MG PO TABS   Oral   Take 180 mg by mouth daily as needed. For allergies.         Marland Kitchen FLUTICASONE PROPIONATE 50 MCG/ACT NA SUSP   Nasal   Place 2 sprays into the nose daily as needed. For allergies.         Marland Kitchen HALOBETASOL PROPIONATE 0.05 % EX CREA      APPLY  CREAM TOPICALLY THREE TIMES DAILY AS NEEDED FOR RASH   50 g   2   . METHIMAZOLE 10 MG PO TABS   Oral   Take 4 tablets (40 mg total) by mouth 2 (two) times daily.   240 tablet   0   . METOPROLOL TARTRATE 25 MG PO TABS   Oral    Take 1 tablet (25 mg total) by mouth 2 (two) times daily.   60 tablet   0   . OXYCODONE-ACETAMINOPHEN 5-325 MG PO TABS      1-2 tabs po q4-6hrs prn pain   90 tablet   0   . PANTOPRAZOLE SODIUM 40 MG PO TBEC   Oral   Take 1 tablet (40 mg total) by mouth daily.   30 tablet   0   . SITAGLIPTIN PHOSPHATE 100 MG PO TABS   Oral   Take 1 tablet (100 mg total) by mouth daily.   30 tablet   0     PT NEEDS TO SCHEDULE FOLLOW UP APPT   . SODIUM CHLORIDE 0.9 % IJ SOLN   Intracatheter   10-40 mLs by Intracatheter route as needed (flush).   5 mL   0   . VERAPAMIL HCL ER (CO) 240 MG PO TB24   Oral   Take 240 mg by mouth at bedtime.         . WARFARIN SODIUM 5 MG PO TABS   Oral   Take 7.5 mg by mouth daily.           BP 151/91  Pulse 103  Temp 98.8 F (37.1 C) (Oral)  Resp 18  SpO2 96%  Physical Exam  Nursing note and vitals reviewed. Constitutional: He is oriented to person, place, and time. He appears well-developed and well-nourished. No distress.  HENT:  Head: Normocephalic and atraumatic.  Mouth/Throat: Oropharynx is clear and moist.  Eyes: Conjunctivae are normal.  Neck: Normal range of motion. Neck supple.  Cardiovascular: Normal rate, regular rhythm, normal heart sounds and intact distal pulses.   No extremity edema.  Pulmonary/Chest: Effort normal. No respiratory distress.  Bibasilar crackles.  Abdominal: Soft. Bowel sounds are normal. There is no tenderness.  Musculoskeletal: Normal range of motion.  Neurological: He is alert and oriented to person, place, and time.  Skin: Skin is warm and dry.  Psychiatric: He has a normal mood and affect. His behavior is normal.    ED Course  Procedures (including critical care time)  Labs Reviewed  CBC WITH DIFFERENTIAL - Abnormal; Notable for the following:    Hemoglobin 11.7 (*)     HCT 35.9 (*)     MCV 73.3 (*)     MCH 23.9 (*)     RDW 16.0 (*)     All other components within normal limits   COMPREHENSIVE METABOLIC PANEL -  Abnormal; Notable for the following:    Glucose, Bld 135 (*)     Creatinine, Ser 2.04 (*)     Albumin 2.9 (*)     ALT 91 (*)     Alkaline Phosphatase 142 (*)     Total Bilirubin 1.3 (*)     GFR calc non Af Amer 37 (*)     GFR calc Af Amer 43 (*)     All other components within normal limits  URINALYSIS, ROUTINE W REFLEX MICROSCOPIC - Abnormal; Notable for the following:    Leukocytes, UA TRACE (*)     All other components within normal limits  PRO B NATRIURETIC PEPTIDE - Abnormal; Notable for the following:    Pro B Natriuretic peptide (BNP) 11423.0 (*)     All other components within normal limits  D-DIMER, QUANTITATIVE - Abnormal; Notable for the following:    D-Dimer, Quant 2.90 (*)     All other components within normal limits  PROTIME-INR - Abnormal; Notable for the following:    Prothrombin Time 29.6 (*)     INR 3.01 (*)     All other components within normal limits  URINE MICROSCOPIC-ADD ON  POCT I-STAT TROPONIN I    Date: 01/31/2013  Rate: 105  Rhythm: sinus tachycardia  QRS Axis: indeterminate  Intervals: normal  ST/T Wave abnormalities: nonspecific T wave changes  Conduction Disutrbances:none  Narrative Interpretation: sinus tachycardia, nonspecific t wave abnormality  Old EKG Reviewed: changes noted prior EKG with atrial flutter   Dg Chest 2 View  01/31/2013  *RADIOLOGY REPORT*  Clinical Data: Shortness of breath.  History pulmonary embolus and atrial fibrillation.  CHEST - 2 VIEW  Comparison: 01/25/2013.  09/19/2012 CT.  Findings: Pulmonary vascular prominence most notable centrally. Cardiomegaly.  Small right-sided pleural effusion.  Findings may reflect changes of mild pulmonary edema.  No segmental consolidation or pneumothorax.  Slightly tortuous aorta.  IMPRESSION: Pulmonary vascular prominence most notable centrally. Cardiomegaly.  Small right-sided pleural effusion.  Findings may reflect changes of mild pulmonary edema.    Original Report Authenticated By: Lacy Duverney, M.D.      1. Pulmonary hypertension       MDM   7:28 PM Patient states some improvement of SOB with nasal cannula. IV lasix 20mg  given. 8:36 PM O2 sat decreased from 98% to 84% when walking.  47 y/o male with SOB worsening after hospitalization. ventilation perfusion scan performed prior to hospital discharge last week negative. Unable to obtain CTA chest at this time due to Cr 2.04. PE unlikely since patient is on Coumadin and INR 3.01. BNP 11423. CXR showing pulmonary edema. Patient will be admitted by Dr. Julian Reil with [facility]. Case discussed with Dr. Manus Gunning who also evaluated patient and agrees with plan of care.  Trevor Mace, PA-C 01/31/13 2119  Trevor Mace, PA-C 02/16/13 0025

## 2013-01-31 NOTE — ED Notes (Signed)
Went to triage to get patient.  Was told by triage patient is in radiology at the moment.

## 2013-01-31 NOTE — ED Notes (Signed)
[facility] paged. Awaiting admission orders.

## 2013-01-31 NOTE — ED Notes (Signed)
MD at bedside. 

## 2013-01-31 NOTE — Telephone Encounter (Signed)
The patient's wife called, at 4pm on 01/31/13, from her place of employment to report that the patient is having shortness of breath and coughing.  After speaking with Dr. Everardo All, this writer advised the patient's wife to take the patient to the emergency room to evaluate his condition as they would not be able to make it to the office prior to the office closing for the day.

## 2013-01-31 NOTE — ED Notes (Signed)
Ambulated pt in hallway with pulse ox. Pt oxygen levels started out at 97% then went down to 84%. Pt denies any lightheadedness or nausea.

## 2013-02-01 ENCOUNTER — Ambulatory Visit: Payer: BC Managed Care – PPO | Admitting: Endocrinology

## 2013-02-01 ENCOUNTER — Telehealth: Payer: Self-pay | Admitting: Endocrinology

## 2013-02-01 ENCOUNTER — Encounter (HOSPITAL_COMMUNITY): Payer: Self-pay

## 2013-02-01 DIAGNOSIS — J811 Chronic pulmonary edema: Secondary | ICD-10-CM

## 2013-02-01 DIAGNOSIS — I2789 Other specified pulmonary heart diseases: Secondary | ICD-10-CM

## 2013-02-01 DIAGNOSIS — I4891 Unspecified atrial fibrillation: Secondary | ICD-10-CM

## 2013-02-01 DIAGNOSIS — E0590 Thyrotoxicosis, unspecified without thyrotoxic crisis or storm: Secondary | ICD-10-CM

## 2013-02-01 DIAGNOSIS — R0602 Shortness of breath: Secondary | ICD-10-CM

## 2013-02-01 LAB — GLUCOSE, CAPILLARY
Glucose-Capillary: 120 mg/dL — ABNORMAL HIGH (ref 70–99)
Glucose-Capillary: 133 mg/dL — ABNORMAL HIGH (ref 70–99)

## 2013-02-01 LAB — PROTIME-INR
INR: 3.13 — ABNORMAL HIGH (ref 0.00–1.49)
Prothrombin Time: 30.5 seconds — ABNORMAL HIGH (ref 11.6–15.2)

## 2013-02-01 LAB — TROPONIN I
Troponin I: <0.3 ng/mL (ref <0.30)
Troponin I: <0.3 ng/mL (ref <0.30)
Troponin I: <0.3 ng/mL (ref <0.30)

## 2013-02-01 MED ORDER — OXYCODONE-ACETAMINOPHEN 5-325 MG PO TABS: 1.0000 | ORAL_TABLET | Freq: Four times a day (QID) | ORAL | Status: DC | PRN

## 2013-02-01 MED ORDER — FUROSEMIDE 40 MG PO TABS
40.0000 mg | ORAL_TABLET | Freq: Every day | ORAL | Status: DC
  Administered 2013-02-01 – 2013-02-04 (×4): 40 mg via ORAL
  Filled 2013-02-01 (×4): qty 1

## 2013-02-01 MED ORDER — WARFARIN - PHARMACIST DOSING INPATIENT: Freq: Every day | Status: DC

## 2013-02-01 MED ORDER — FUROSEMIDE 40 MG PO TABS
40.0000 mg | ORAL_TABLET | Freq: Two times a day (BID) | ORAL | Status: DC
  Filled 2013-02-01 (×2): qty 1

## 2013-02-01 MED ORDER — FLUTICASONE PROPIONATE 50 MCG/ACT NA SUSP: 2.0000 | Freq: Every day | NASAL | Status: DC | PRN

## 2013-02-01 MED ORDER — WARFARIN SODIUM 2.5 MG PO TABS
2.5000 mg | ORAL_TABLET | Freq: Once | ORAL | Status: AC
  Administered 2013-02-01: 2.5 mg via ORAL
  Filled 2013-02-01: qty 1

## 2013-02-01 MED ORDER — METHIMAZOLE 10 MG PO TABS
40.0000 mg | ORAL_TABLET | Freq: Two times a day (BID) | ORAL | Status: DC
  Administered 2013-02-01 – 2013-02-04 (×7): 40 mg via ORAL
  Filled 2013-02-01 (×8): qty 4

## 2013-02-01 MED ORDER — METOPROLOL TARTRATE 25 MG PO TABS
25.0000 mg | ORAL_TABLET | Freq: Two times a day (BID) | ORAL | Status: DC
  Administered 2013-02-01 (×2): 25 mg via ORAL
  Filled 2013-02-01 (×3): qty 1

## 2013-02-01 MED ORDER — ONDANSETRON HCL 4 MG/2ML IJ SOLN
4.0000 mg | Freq: Four times a day (QID) | INTRAMUSCULAR | Status: DC | PRN
  Administered 2013-02-01: 4 mg via INTRAVENOUS
  Filled 2013-02-01: qty 2

## 2013-02-01 MED ORDER — SODIUM CHLORIDE 0.9 % IJ SOLN
3.0000 mL | Freq: Two times a day (BID) | INTRAMUSCULAR | Status: DC
  Administered 2013-02-01 – 2013-02-02 (×5): 3 mL via INTRAVENOUS

## 2013-02-01 MED ORDER — BOOST / RESOURCE BREEZE PO LIQD
1.0000 | Freq: Every day | ORAL | Status: DC
  Administered 2013-02-01 – 2013-02-02 (×2): 1 via ORAL

## 2013-02-01 MED ORDER — VERAPAMIL HCL ER 240 MG PO TBCR
240.0000 mg | EXTENDED_RELEASE_TABLET | Freq: Every day | ORAL | Status: DC
  Administered 2013-02-01 – 2013-02-04 (×4): 240 mg via ORAL
  Filled 2013-02-01 (×4): qty 1

## 2013-02-01 MED ORDER — ACETAMINOPHEN 325 MG PO TABS
650.0000 mg | ORAL_TABLET | ORAL | Status: DC | PRN
  Administered 2013-02-01 – 2013-02-03 (×3): 650 mg via ORAL
  Filled 2013-02-01 (×4): qty 2

## 2013-02-01 MED ORDER — PANTOPRAZOLE SODIUM 40 MG PO TBEC
40.0000 mg | DELAYED_RELEASE_TABLET | Freq: Every day | ORAL | Status: DC
  Administered 2013-02-01 – 2013-02-02 (×2): 40 mg via ORAL
  Filled 2013-02-01: qty 1

## 2013-02-01 MED ORDER — ISOSORB DINITRATE-HYDRALAZINE 20-37.5 MG PO TABS
1.0000 | ORAL_TABLET | Freq: Two times a day (BID) | ORAL | Status: DC
  Administered 2013-02-01 – 2013-02-04 (×6): 1 via ORAL
  Filled 2013-02-01 (×8): qty 1

## 2013-02-01 MED ORDER — SODIUM CHLORIDE 0.9 % IV SOLN: 250.0000 mL | INTRAVENOUS | Status: DC | PRN

## 2013-02-01 MED ORDER — ACETAMINOPHEN 325 MG PO TABS: 650.0000 mg | ORAL_TABLET | Freq: Four times a day (QID) | ORAL | Status: DC | PRN

## 2013-02-01 MED ORDER — INSULIN ASPART 100 UNIT/ML ~~LOC~~ SOLN
0.0000 [IU] | Freq: Three times a day (TID) | SUBCUTANEOUS | Status: DC
  Administered 2013-02-02 (×3): 2 [IU] via SUBCUTANEOUS

## 2013-02-01 MED ORDER — METOPROLOL TARTRATE 12.5 MG HALF TABLET
12.5000 mg | ORAL_TABLET | Freq: Two times a day (BID) | ORAL | Status: DC
  Administered 2013-02-01 – 2013-02-04 (×6): 12.5 mg via ORAL
  Filled 2013-02-01 (×7): qty 1

## 2013-02-01 MED ORDER — LORATADINE 10 MG PO TABS
10.0000 mg | ORAL_TABLET | Freq: Every day | ORAL | Status: DC
  Filled 2013-02-01 (×4): qty 1

## 2013-02-01 MED ORDER — ATORVASTATIN CALCIUM 20 MG PO TABS
20.0000 mg | ORAL_TABLET | Freq: Every day | ORAL | Status: DC
  Administered 2013-02-01 – 2013-02-03 (×3): 20 mg via ORAL
  Filled 2013-02-01 (×4): qty 1

## 2013-02-01 MED ORDER — LINAGLIPTIN 5 MG PO TABS
5.0000 mg | ORAL_TABLET | Freq: Every day | ORAL | Status: DC
  Administered 2013-02-01 – 2013-02-04 (×4): 5 mg via ORAL
  Filled 2013-02-01 (×4): qty 1

## 2013-02-01 MED ORDER — SODIUM CHLORIDE 0.9 % IJ SOLN: 3.0000 mL | INTRAMUSCULAR | Status: DC | PRN

## 2013-02-01 MED ORDER — FUROSEMIDE 10 MG/ML IJ SOLN
40.0000 mg | Freq: Two times a day (BID) | INTRAMUSCULAR | Status: DC
  Administered 2013-02-01 (×2): 40 mg via INTRAVENOUS
  Filled 2013-02-01 (×3): qty 4

## 2013-02-01 NOTE — ED Notes (Signed)
Attempted to call report to floor.  Was told RN would return my call shortly.   

## 2013-02-01 NOTE — Progress Notes (Signed)
INITIAL NUTRITION ASSESSMENT  DOCUMENTATION CODES Per approved criteria  -Obesity Unspecified   INTERVENTION:  Resource Breeze supplement daily (250 kcals, 9 gm protein per 8 fl oz carton) RD to follow for nutrition care plan  NUTRITION DIAGNOSIS: Unintended weight loss related to hyperthyroidism as evidenced by 9% weight loss  Goal: Oral intake with meals & supplements to meet >/= 90% of estimated nutrition needs  Monitor:  PO & supplemental intake, weight, labs, I/O's  Reason for Assessment: Malnutrition Screening Tool Report  47 y.o. male  Admitting Dx: Pulmonary edema  ASSESSMENT: Patient presented with ongoing shortness of breath; in ED he was noted to have pulmonary edema on CXR; reports his appetite "isn't very good;" PO intake 0-50% per flowsheet records; states he's lost approximately 23 lbs since Christmas 2013 (9%) due to medications/hyperthyroidism---> significant for time frame; amenable to trying [employer] supplement as he doesn't care for Ensure ---> RD to order.  Height: Ht Readings from Last 1 Encounters:  02/01/13 6' (1.829 m)    Weight: Wt Readings from Last 1 Encounters:  02/01/13 232 lb 9.4 oz (105.5 kg)    Ideal Body Weight: 178 lb  % Ideal Body Weight: 130%  Wt Readings from Last 10 Encounters:  02/01/13 232 lb 9.4 oz (105.5 kg)  01/27/13 257 lb 4.4 oz (116.7 kg)  01/03/13 253 lb (114.76 kg)  01/03/13 253 lb (114.76 kg)  12/20/12 253 lb (114.76 kg)  12/02/12 259 lb (117.482 kg)  11/12/12 259 lb 6.4 oz (117.663 kg)  09/19/12 264 lb (119.75 kg)  09/06/12 266 lb 6 oz (120.827 kg)  10/09/11 252 lb 6 oz (114.477 kg)    Usual Body Weight: 255 lb  % Usual Body Weight: 91%  BMI:  Body mass index is 31.54 kg/(m^2).  Estimated Nutritional Needs: Kcal: 2100-2300 Protein: 105-115 gm Fluid: 2.1-2.3 L  Skin: Intact  Diet Order: Cardiac  EDUCATION NEEDS: -No education needs identified at this time   Intake/Output Summary (Last  24 hours) at 02/01/13 1233 Last data filed at 02/01/13 1055  Gross per 24 hour  Intake    480 ml  Output   5375 ml  Net  -4895 ml    Labs:   Lab 01/31/13 1732 01/27/13 0433 01/26/13 0545  NA 141 139 139  K 4.1 4.0 4.1  CL 105 106 106  CO2 24 21 21   BUN 10 26* 24*  CREATININE 2.04* 2.95* 3.03*  CALCIUM 8.8 8.7 8.8  MG -- -- --  PHOS -- -- --  GLUCOSE 135* 122* 144*    CBG (last 3)   Basename 02/01/13 1124 02/01/13 0620 02/01/13 0202  GLUCAP 120* 111* 158*    Scheduled Meds:   . atorvastatin  20 mg Oral Daily  . furosemide  40 mg Intravenous Q12H  . insulin aspart  0-15 Units Subcutaneous TID WC  . linagliptin  5 mg Oral Daily  . loratadine  10 mg Oral Daily  . methimazole  40 mg Oral BID  . metoprolol tartrate  25 mg Oral BID  . pantoprazole  40 mg Oral Daily  . sodium chloride  3 mL Intravenous Q12H  . verapamil  240 mg Oral Daily  . warfarin  2.5 mg Oral ONCE-1800  . Warfarin - Pharmacist Dosing Inpatient   Does not apply q1800    Continuous Infusions:   Past Medical History  Diagnosis Date  . DM 08/08/2008  . DYSLIPIDEMIA 04/26/2009  . GOUT 10/24/2010  . HYPERTENSION 07/21/2007  . PULMONARY  EMBOLISM 05/03/2009  . DEEP VENOUS THROMBOPHLEBITIS, LEG, LEFT 05/05/2009  . ALLERGIC RHINITIS 04/12/2009  . UNSPECIFIED URINARY CALCULUS 07/04/2010  . 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 ECZEMA 05/23/2010  . Long term (current) use of anticoagulants 04/03/2011  . BACK PAIN, LUMBAR 01/14/2011  . 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 Hyperglycemia   . Leukopenia   . Atrial fibrillation   . GERD (gastroesophageal reflux disease)     Past Surgical History  Procedure Date  . Anterior cruciate ligament repair 2000    Right  . Knee arthroscopy     left knee  . Knee arthroscopy w/ acl reconstruction     right knee  . Cystectomy     back of head  . Knee arthroscopy 01/03/2013    Procedure: ARTHROSCOPY KNEE;  Surgeon: Thera Flake., MD;  Location: [facility] OR;  Service:  Orthopedics;  Laterality: Left;  Lavage Synovectomy, Removal of loose body    Maureen Chatters, RD, LDN Pager #: [phone] After-Hours Pager #: [phone]

## 2013-02-01 NOTE — Progress Notes (Signed)
ANTICOAGULATION CONSULT NOTE Pharmacy Consult for Coumadin Indication: atrial fibrillation and h/o PE/DVT  Allergies  Allergen Reactions  . Metformin     REACTION: nausea/diarrhea    Labs:  Basename 02/01/13 0505 02/01/13 0445 02/01/13 0002 01/31/13 1732  HGB -- -- -- 11.7*  HCT -- -- -- 35.9*  PLT -- -- -- 168  APTT -- -- -- --  LABPROT -- 30.5* -- 29.6*  INR -- 3.13* -- 3.01*  HEPARINUNFRC -- -- -- --  CREATININE -- -- -- 2.04*  CKTOTAL -- -- -- --  CKMB -- -- -- --  TROPONINI <0.30 -- <0.30 --    Assessment: 47yo male c/o SOB x5d associated w/ nausea, VQ scan showed no acute PE, to continue Coumadin for Afib and h/o PE/DVT  Dose PTA = 7.5 mg daily  INR = 3.13 (slightly elevated)  Goal of Therapy:  INR 2-3   Plan:  1) Coumadin 2.5 mg po x 1 dose at 1800 pm 2) Follow up AM INR  Thank you. Okey Regal, PharmD [phone]  02/01/2013,9:26 AM

## 2013-02-01 NOTE — Telephone Encounter (Signed)
The patient's wife called this morning to report that Mr. Spencer English was admitted to the hospital on 01/31/13 with pulmonary hypertension and congestive heart failure.  The patient's wife wanted Dr. Everardo All to be aware of the diagnosis and that his appointment for today, 02/01/13 needed to be cancelled due to hospitalization.

## 2013-02-01 NOTE — Progress Notes (Signed)
ANTICOAGULATION CONSULT NOTE - Initial Consult  Pharmacy Consult for Coumadin Indication: atrial fibrillation and h/o PE/DVT  Allergies  Allergen Reactions  . Metformin     REACTION: nausea/diarrhea     Vital Signs: Temp: 98.8 F (37.1 C) (02/03 1716) Temp src: Oral (02/03 1716) BP: 144/91 mmHg (02/03 2200) Pulse Rate: 91  (02/03 2200)  Labs:  [facility] 01/31/13 1732  HGB 11.7*  HCT 35.9*  PLT 168  APTT --  LABPROT 29.6*  INR 3.01*  HEPARINUNFRC --  CREATININE 2.04*  CKTOTAL --  CKMB --  TROPONINI --    Medical History: Past Medical History  Diagnosis Date  . DM 08/08/2008  . DYSLIPIDEMIA 04/26/2009  . GOUT 10/24/2010  . HYPERTENSION 07/21/2007  . PULMONARY EMBOLISM 05/03/2009  . DEEP VENOUS THROMBOPHLEBITIS, LEG, LEFT 05/05/2009  . ALLERGIC RHINITIS 04/12/2009  . UNSPECIFIED URINARY CALCULUS 07/04/2010  . 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 ECZEMA 05/23/2010  . Long term (current) use of anticoagulants 04/03/2011  . BACK PAIN, LUMBAR 01/14/2011  . 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 Hyperglycemia   . Leukopenia   . Atrial fibrillation   . GERD (gastroesophageal reflux disease)      Assessment: 47yo male c/o SOB x5d associated w/ nausea, VQ scan showed no acute PE, to continue Coumadin for Afib and h/o PE/DVT; admitted with therapeutic INR.  Goal of Therapy:  INR 2-3   Plan:  Last home Coumadin dose taken 2/3; will await am INR prior to resuming home dose to ensure INR not rising since at high end of goal.  Colleen Can PharmD BCPS 02/01/2013,12:19 AM

## 2013-02-01 NOTE — Progress Notes (Signed)
47 year old male with past medical history significant for diabetes, dyslipidemia, hypertension, mild pulmonary hypertension (as seen on recent 2-D echo; January 2014), GERD and atrial fibrillation; admitted after midnight secondary to increase shortness of breath. According to the patient since his discharge on 01/27/2013, he has been feeling SOB at rest and also on exertion. During that admission patient was diagnosed with hyperthyroidism and started on Tapazole; patient reports that he found out of the medication about 2 days prior to this admission and was actually scheduled to see endocrinology today (02/01/13). BNP was elevated, but patient was also noticed to have acute renal failure on past admission, creatinine at this time 2.0  For further details of admission and also point-of-care please refer to note dictated by Dr. Julian Reil.  Plan: -Patient without any crackles, reports no to be having so much of shortness of breath at this moment; will transition Lasix to by mouth once a day. -Check needs for oxygen saturation at rest and also on exertion while on room air. -Follow Cr closely -continue beta blocker -start BIDL BID -continue coumadin for a. Fib -further tx and medical decision base on clinical evolution  Auriana Scalia [phone]

## 2013-02-01 NOTE — H&P (Signed)
[facility] History and Physical  Spencer English JYN:[id number] DOB: 07/19/1966 DOA: 01/31/2013  Referring physician: ED PCP: Romero Belling, MD  Specialists: [place]  Chief Complaint: SOB  HPI: Spencer English is a 47 y.o. male who presents with ongoing SOB not resolved since his discharge from the hospital on Friday.  Last admission he was admitted for syncope and hypotension felt to be due to his PDE-5 medication Cialis as well as acute kidney failure.  As a result his ARB was discontinued and he was not taking home meds including lasix that he had been on in the past at home for the past couple of days.  While he does not have a prior history of CHF, he does have a history of peripheral edema for which he has been on lasix for several years.  Additionally his 2d echo demonstrated pulmonary HTN which he says he has not heard of having before.  He was taking the Cialis for erectile dysfunction but had been taking this for 5 years (prior to his PE 3 years ago).  In the ED he was noted to have pulmonary edema on CXR, and hospitalist has been asked to re-admit the patient.  Review of Systems: 12 systems reviewed and otherwise negative.  Past Medical History  Diagnosis Date  . DM 08/08/2008  . DYSLIPIDEMIA 04/26/2009  . GOUT 10/24/2010  . HYPERTENSION 07/21/2007  . PULMONARY EMBOLISM 05/03/2009  . DEEP VENOUS THROMBOPHLEBITIS, LEG, LEFT 05/05/2009  . ALLERGIC RHINITIS 04/12/2009  . UNSPECIFIED URINARY CALCULUS 07/04/2010  . 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 ECZEMA 05/23/2010  . Long term (current) use of anticoagulants 04/03/2011  . BACK PAIN, LUMBAR 01/14/2011  . 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 Hyperglycemia   . Leukopenia   . Atrial fibrillation   . GERD (gastroesophageal reflux disease)    Past Surgical History  Procedure Date  . Anterior cruciate ligament repair 2000    Right  . Knee arthroscopy     left knee  . Knee arthroscopy w/ acl reconstruction     right knee  . Cystectomy      back of head  . Knee arthroscopy 01/03/2013    Procedure: ARTHROSCOPY KNEE;  Surgeon: Thera Flake., MD;  Location: [facility] OR;  Service: Orthopedics;  Laterality: Left;  Lavage Synovectomy, Removal of loose body   Social History:  reports that he quit smoking about 8 years ago. He has never used smokeless tobacco. He reports that he drinks alcohol. He reports that he does not use illicit drugs.   Allergies  Allergen Reactions  . Metformin     REACTION: nausea/diarrhea    Family History  Problem Relation Age of Onset  . Heart disease Neg Hx     no premature CAD known  . Diabetes Mother   . Hypertension Mother   . Hyperlipidemia Mother   . Diabetes Father   . Hypertension Father   . Hyperlipidemia Father     Prior to Admission medications   Medication Sig Start Date End Date Taking? Authorizing Provider  acetaminophen (TYLENOL) 325 MG tablet Take 2 tablets (650 mg total) by mouth every 6 (six) hours as needed for pain. 01/27/13  Yes Sorin Luanne Bras, MD  allopurinol (ZYLOPRIM) 300 MG tablet Take 1 tablet (300 mg total) by mouth as needed. For gout flare-up. 09/06/12  Yes Romero Belling, MD  atorvastatin (LIPITOR) 20 MG tablet Take 1 tablet (20 mg total) by mouth daily. 09/06/12 09/06/13 Yes Romero Belling, MD  fexofenadine Joyce Copa)  180 MG tablet Take 180 mg by mouth daily as needed. For allergies.   Yes Historical Provider, MD  fluticasone (FLONASE) 50 MCG/ACT nasal spray Place 2 sprays into the nose daily as needed. For allergies.   Yes Historical Provider, MD  methimazole (TAPAZOLE) 10 MG tablet Take 4 tablets (40 mg total) by mouth 2 (two) times daily. 01/27/13  Yes Sorin Luanne Bras, MD  metoprolol tartrate (LOPRESSOR) 25 MG tablet Take 1 tablet (25 mg total) by mouth 2 (two) times daily. 01/06/13  Yes Margart Sickles, PA-C  oxyCODONE-acetaminophen (ROXICET) 5-325 MG per tablet 1-2 tabs po q4-6hrs prn pain 01/06/13  Yes Joshua Chadwell, PA-C  pantoprazole (PROTONIX) 40 MG tablet Take 1 tablet (40 mg  total) by mouth daily. 01/27/13  Yes Sorin Luanne Bras, MD  sitaGLIPtin (JANUVIA) 100 MG tablet Take 1 tablet (100 mg total) by mouth daily. 12/20/12  Yes Carlus Pavlov, MD  verapamil (COVERA HS) 240 MG (CO) 24 hr tablet Take 240 mg by mouth daily.    Yes Historical Provider, MD  warfarin (COUMADIN) 5 MG tablet Take 7.5 mg by mouth daily.   Yes Historical Provider, MD   Physical Exam: Filed Vitals:   01/31/13 1900 01/31/13 2000 01/31/13 2100 01/31/13 2200  BP: 143/92 135/72 139/88 144/91  Pulse: 100 99 88 91  Temp:      TempSrc:      Resp: 21 18 41 20  SpO2: 100% 100% 99% 99%    General:  NAD, resting comfortably in bed Eyes: PEERLA EOMI ENT: mucous membranes moist Neck: supple w/o JVD Cardiovascular: RRR w/o MRG Respiratory: few rhonchi at bases bilaterally Abdomen: soft, nt, nd, bs+ Skin: no rash nor lesion Musculoskeletal: MAE, full ROM all 4 extremities Psychiatric: normal tone and affect Neurologic: AAOx3, grossly non-focal  Labs on Admission:  Basic Metabolic Panel:  Lab 01/31/13 4540 01/27/13 0433 01/26/13 0545 01/25/13 0509  NA 141 139 139 138  K 4.1 4.0 4.1 3.8  CL 105 106 106 106  CO2 24 21 21 19   GLUCOSE 135* 122* 144* 128*  BUN 10 26* 24* 28*  CREATININE 2.04* 2.95* 3.03* 3.22*  CALCIUM 8.8 8.7 8.8 8.9  MG -- -- -- --  PHOS -- -- -- --   Liver Function Tests:  Lab 01/31/13 1732 01/27/13 0433  AST 34 63*  ALT 91* 366*  ALKPHOS 142* 130*  BILITOT 1.3* 1.1  PROT 6.9 6.1  ALBUMIN 2.9* 2.7*   No results found for this basename: LIPASE:5,AMYLASE:5 in the last 168 hours No results found for this basename: AMMONIA:5 in the last 168 hours CBC:  Lab 01/31/13 1732 01/27/13 0433 01/26/13 0545 01/25/13 0509  WBC 8.6 8.3 9.3 7.3  NEUTROABS 6.0 5.8 -- --  HGB 11.7* 9.7* 10.3* 10.4*  HCT 35.9* 29.9* 31.9* 32.3*  MCV 73.3* 72.7* 73.3* 72.6*  PLT 168 123* 128* 127*   Cardiac Enzymes: No results found for this basename: CKTOTAL:5,CKMB:5,CKMBINDEX:5,TROPONINI:5  in the last 168 hours  BNP (last 3 results)  Basename 01/31/13 1736  PROBNP 11423.0*   CBG:  Lab 01/27/13 0733 01/26/13 2105 01/26/13 1649 01/26/13 1130 01/26/13 0731  GLUCAP 151* 131* 185* 117* 134*    Radiological Exams on Admission: Dg Chest 2 View  01/31/2013  *RADIOLOGY REPORT*  Clinical Data: Shortness of breath.  History pulmonary embolus and atrial fibrillation.  CHEST - 2 VIEW  Comparison: 01/25/2013.  09/19/2012 CT.  Findings: Pulmonary vascular prominence most notable centrally. Cardiomegaly.  Small right-sided pleural effusion.  Findings may  reflect changes of mild pulmonary edema.  No segmental consolidation or pneumothorax.  Slightly tortuous aorta.  IMPRESSION: Pulmonary vascular prominence most notable centrally. Cardiomegaly.  Small right-sided pleural effusion.  Findings may reflect changes of mild pulmonary edema.   Original Report Authenticated By: Lacy Duverney, M.D.     EKG: Independently reviewed.  Assessment/Plan Principal Problem:  *Pulmonary edema Active Problems:  PULMONARY EMBOLISM  Atrial fibrillation  Moderate to severe pulmonary hypertension  Hyperthyroidism   1. Pulmonary edema - secondary to CHF, likely related to the patients pulmonary HTN in the setting of AKI during last admit and taking the patient off of chronic lasix, ARB, and possibly PDE-5 inhibitor (although not being taken for pulmonary HTN, wonder if this had been helping his underlying pulm HTN as well).  Admitting and putting on lasix 40 q12h iv for now.  His kidney function is improved with creatinine down to 2.0 since last admit, will of course recheck this in AM. 2. A.Fib and h/o PE - continue coumadin 3. Hyperthyroidism - continue tapazole.    Code Status: Full code (must indicate code status--if unknown or must be presumed, indicate so) Family Communication: Spoke with wife at bedside (indicate person spoken with, if applicable, with phone number if by telephone) Disposition Plan:  Admit to inpatient (indicate anticipated LOS)  Time spent: 70 min  GARDNER, JARED M. [facility] Pager [phone]  If 7PM-7AM, please contact night-coverage [email or web address] Password [facility] 02/01/2013, 12:07 AM

## 2013-02-01 NOTE — Progress Notes (Signed)
Patient reports that he has not  Had his 02 on most of the morning and that his breathing is fine sats done sitting 99% RA and walking 99% did not want to get back in bed for lying sat will do later as ordered

## 2013-02-01 NOTE — Progress Notes (Signed)
SATURATION QUALIFICATIONS: (This note is used to comply with regulatory documentation for home oxygen)  Patient Saturations on Room Air at Rest = 99%  Patient Saturations on Room Air while Ambulating =99%  Patient Saturations on 0  Liters of oxygen while Ambulating = 0%  Please briefly explain why patient needs home oxygen :

## 2013-02-01 NOTE — ED Provider Notes (Signed)
Medical screening examination/treatment/procedure(s) were conducted as a shared visit with non-physician practitioner(s) and myself.  I personally evaluated the patient during the encounter  Recent admit for hypotension and AKI after using cialis. Worsening SOB since discharge 4 days ago. Hx PE but VQ scan during admit negative. Worse SOB with exertion and nausea. No chest pain. Bibasilar crackles without frank edema. Hypoxic with ambulation.  Glynn Octave, MD 02/01/13 [phone]

## 2013-02-01 NOTE — Progress Notes (Signed)
All sat's done on RA

## 2013-02-02 DIAGNOSIS — E119 Type 2 diabetes mellitus without complications: Secondary | ICD-10-CM

## 2013-02-02 LAB — GLUCOSE, CAPILLARY: Glucose-Capillary: 135 mg/dL — ABNORMAL HIGH (ref 70–99)

## 2013-02-02 LAB — BASIC METABOLIC PANEL
Chloride: 100 mEq/L (ref 96–112)
GFR calc Af Amer: 38 mL/min — ABNORMAL LOW (ref >90)
GFR calc non Af Amer: 33 mL/min — ABNORMAL LOW (ref >90)
Potassium: 3.3 mEq/L — ABNORMAL LOW (ref 3.5–5.1)
Sodium: 140 mEq/L (ref 135–145)

## 2013-02-02 LAB — PROTIME-INR: Prothrombin Time: 25 seconds — ABNORMAL HIGH (ref 11.6–15.2)

## 2013-02-02 MED ORDER — WARFARIN SODIUM 7.5 MG PO TABS
7.5000 mg | ORAL_TABLET | Freq: Once | ORAL | Status: AC
  Administered 2013-02-02: 7.5 mg via ORAL
  Filled 2013-02-02: qty 1

## 2013-02-02 MED ORDER — DICLOFENAC SODIUM 1 % TD GEL
2.0000 g | Freq: Four times a day (QID) | TRANSDERMAL | Status: DC
  Administered 2013-02-02: 2 g via TOPICAL
  Filled 2013-02-02 (×2): qty 100

## 2013-02-02 NOTE — Progress Notes (Signed)
ANTICOAGULATION CONSULT NOTE Pharmacy Consult for Coumadin Indication: atrial fibrillation and h/o PE/DVT  Allergies  Allergen Reactions  . Metformin     REACTION: nausea/diarrhea    Labs:  Basename 02/02/13 0615 02/01/13 1225 02/01/13 0505 02/01/13 0445 02/01/13 0002 01/31/13 1732  HGB -- -- -- -- -- 11.7*  HCT -- -- -- -- -- 35.9*  PLT -- -- -- -- -- 168  APTT -- -- -- -- -- --  LABPROT 25.0* -- -- 30.5* -- 29.6*  INR 2.39* -- -- 3.13* -- 3.01*  HEPARINUNFRC -- -- -- -- -- --  CREATININE 2.26* -- -- -- -- 2.04*  CKTOTAL -- -- -- -- -- --  CKMB -- -- -- -- -- --  TROPONINI -- <0.30 <0.30 -- <0.30 --    Assessment: 47yo male c/o SOB x5d associated w/ nausea, VQ scan showed no acute PE, to continue Coumadin for Afib and h/o PE/DVT  Dose PTA = 7.5 mg daily  INR = 2.39  Goal of Therapy:  INR 2-3   Plan:  1) Coumadin 7.5 mg po x 1 dose at 1800 pm 2) Follow up AM INR  Thank you. Okey Regal, PharmD [phone]  02/02/2013,9:31 AM

## 2013-02-02 NOTE — Progress Notes (Signed)
[facility] PROGRESS NOTE  Spencer English WUJ:[id number] DOB: [birth year] DOA: 01/31/2013 PCP: Romero Belling, MD  Brief narrative 47 year old male with past medical history significant for diabetes, dyslipidemia, hypertension, mild pulmonary hypertension (as seen on recent 2-D echo; January 2014), GERD and atrial fibrillation; admitted on 2/4 secondary to increase shortness of breath. According to the patient since his discharge on 01/27/2013, he has been feeling SOB at rest and also on exertion. During that admission patient was diagnosed with hyperthyroidism and started on Tapazole. BNP was elevated, but patient was also noticed to have acute renal failure on past admission.  Assessment/Plan: 1. Dyspnea, possibly from acute on chronic diastolic CHF, volume resuscitation during recent admission for AKI and pulmonary hypertension: Improved with diuresis. Continue oral Lasix. Monitor BMP closely. 2. Paroxysmal A. fib: Continue beta blockers, verapamil and anticoagulation. Outpatient followup with Dr. Debbe Odea have to reschedule. 3. Hyperthyroidism: Continue Tapazole, metoprolol and outpatient followup with endocrinology. 4. Type II DM: Reasonably controlled. Continue SSI and Linagliptin 5. Recent episode of acute renal failure: Creatinine mildly elevated compared to yesterday. Follow BMP in a.m. Consider nephrology consultation, possibly as outpatient. Was attributed to hypotension related ATN. Abdominal ultrasound negative for hydronephrosis. 6. History of septic arthritis: Patient complains of knee pain but no acute findings on exam. 7. History of syncope, hypotension and seizure during recent hospitalization. No further episodes. 8. Anemia and thrombocytopenia: Anemia stable. From the cytopenia resolved. 9. Abnormal LFTs: Unclear etiology. Recent abdominal ultrasound negative for gallstones. Improving. Follow LFTs in a.m.  Code Status: Full Family Communication: Discussed with  patient spouse at the bedside.  Disposition Plan: Home , possibly 2/6   Consultants:  None  Procedures:  None  Antibiotics:  None   HPI/Subjective: Dyspnea much improved. Able to ambulate in the halls without dyspnea. No chest pain.  Objective: Filed Vitals:   02/01/13 2000 02/02/13 0440 02/02/13 0818 02/02/13 1345  BP: 104/52 136/87 130/68 101/62  Pulse: 120 98 98 85  Temp: 98.9 F (37.2 C) 99.5 F (37.5 C) 98.3 F (36.8 C) 98.3 F (36.8 C)  TempSrc: Oral Oral Oral Oral  Resp: 20 21 18 18   Height:      Weight:  102.876 kg (226 lb 12.8 oz)    SpO2: 99% 97% 98% 94%    Intake/Output Summary (Last 24 hours) at 02/02/13 1742 Last data filed at 02/02/13 1300  Gross per 24 hour  Intake    486 ml  Output   1800 ml  Net  -1314 ml   Filed Weights   02/01/13 0210 02/02/13 0440  Weight: 105.5 kg (232 lb 9.4 oz) 102.876 kg (226 lb 12.8 oz)    Exam:   General exam: Pleasant and comfortable. Lying supine in bed.  Respiratory system: Clear. No increased work of breathing.  Cardiovascular system: S1 and S2 heard, RRR. Telemetry shows PAF. No JVD, murmurs or pedal edema.  Gastrointestinal system: Abdomen is nondistended, soft and nontender. Normal bowel sounds heard.  Central nervous system: Alert and oriented. No focal neurological deficits.  Extremities: Symmetric 5 x 5 power  Data Reviewed: Basic Metabolic Panel:  Lab 02/02/13 9562 01/31/13 1732 01/27/13 0433  NA 140 141 139  K 3.3* 4.1 4.0  CL 100 105 106  CO2 29 24 21   GLUCOSE 110* 135* 122*  BUN 14 10 26*  CREATININE 2.26* 2.04* 2.95*  CALCIUM 7.8* 8.8 8.7  MG -- -- --  PHOS -- -- --   Liver Function Tests:  Lab 01/31/13 1732  01/27/13 0433  AST 34 63*  ALT 91* 366*  ALKPHOS 142* 130*  BILITOT 1.3* 1.1  PROT 6.9 6.1  ALBUMIN 2.9* 2.7*   No results found for this basename: LIPASE:5,AMYLASE:5 in the last 168 hours No results found for this basename: AMMONIA:5 in the last 168  hours CBC:  Lab 01/31/13 1732 01/27/13 0433  WBC 8.6 8.3  NEUTROABS 6.0 5.8  HGB 11.7* 9.7*  HCT 35.9* 29.9*  MCV 73.3* 72.7*  PLT 168 123*   Cardiac Enzymes:  Lab 02/01/13 1225 02/01/13 0505 02/01/13 0002  CKTOTAL -- -- --  CKMB -- -- --  CKMBINDEX -- -- --  TROPONINI <0.30 <0.30 <0.30   BNP (last 3 results)  Basename 01/31/13 1736  PROBNP 11423.0*   CBG:  Lab 02/02/13 1116 02/02/13 0551 02/01/13 2138 02/01/13 1124 02/01/13 0620  GLUCAP 135* 126* 133* 120* 111*    No results found for this or any previous visit (from the past 240 hour(s)).   Studies: Dg Chest 2 View  01/31/2013  *RADIOLOGY REPORT*  Clinical Data: Shortness of breath.  History pulmonary embolus and atrial fibrillation.  CHEST - 2 VIEW  Comparison: 01/25/2013.  09/19/2012 CT.  Findings: Pulmonary vascular prominence most notable centrally. Cardiomegaly.  Small right-sided pleural effusion.  Findings may reflect changes of mild pulmonary edema.  No segmental consolidation or pneumothorax.  Slightly tortuous aorta.  IMPRESSION: Pulmonary vascular prominence most notable centrally. Cardiomegaly.  Small right-sided pleural effusion.  Findings may reflect changes of mild pulmonary edema.   Original Report Authenticated By: Lacy Duverney, M.D.     Scheduled Meds:    . atorvastatin  20 mg Oral Daily  . feeding supplement  1 Container Oral Q1500  . furosemide  40 mg Oral Daily  . insulin aspart  0-15 Units Subcutaneous TID WC  . isosorbide-hydrALAZINE  1 tablet Oral BID  . linagliptin  5 mg Oral Daily  . loratadine  10 mg Oral Daily  . methimazole  40 mg Oral BID  . metoprolol tartrate  12.5 mg Oral BID  . pantoprazole  40 mg Oral Daily  . sodium chloride  3 mL Intravenous Q12H  . verapamil  240 mg Oral Daily  . warfarin  7.5 mg Oral ONCE-1800  . Warfarin - Pharmacist Dosing Inpatient   Does not apply q1800   Continuous Infusions:   Principal Problem:  *Pulmonary edema Active Problems:  PULMONARY  EMBOLISM  Atrial fibrillation  Moderate to severe pulmonary hypertension  Hyperthyroidism    Time spent: 30 minutes    [facility]  [facility] Pager [phone]. If 8PM-8AM, please contact night-coverage at [email or web address], password [facility] 02/02/2013, 5:42 PM  LOS: 2 days

## 2013-02-02 NOTE — Care Management Note (Signed)
    Page 1 of 1   02/04/2013     3:32:50 PM   CARE MANAGEMENT NOTE 02/04/2013  Patient:  Spencer English, Spencer English   Account Number:  [id number]  Date Initiated:  02/02/2013  Documentation initiated by:  Royalti Schauf  Subjective/Objective Assessment:   PT ADM ON 01/31/13 WITH SOB AND PUMONARY EDEMA.  PTA, PT INDEPENDENT, LIVES WITH SPOUSE.     Action/Plan:   PAST HX OF HH WITH [facility].  WILL FOLLOW FOR HOME NEEDS AS PT PROGRESSES.   Anticipated DC Date:  02/03/2013   Anticipated DC Plan:  HOME W HOME HEALTH SERVICES      DC Planning Services  CM consult      [facility] Choice  HOME HEALTH   Choice offered to / List presented to:  C-1 Patient        HH arranged  HH-1 RN      [facility] agency  [facility]   Status of service:  Completed, signed off Medicare Important Message given?   (If response is "NO", the following Medicare IM given date fields will be blank) Date Medicare IM given:   Date Additional Medicare IM given:    Discharge Disposition:  HOME W HOME HEALTH SERVICES  Per UR Regulation:  Reviewed for med. necessity/level of care/duration of stay  If discussed at Long Length of Stay Meetings, dates discussed:    Comments:  02/04/13 Rosalita Chessman [phone] PT FOR DC HOME TODAY.  WILL NEED HHRN FOR BLOOD DRAW ON MONDAY 2/10.  PT REQUESTS [facility] FOR HOME CARE.  REFERRAL TO [facility]--START OF CARE 24-48H POST DC DATE.

## 2013-02-02 NOTE — Progress Notes (Signed)
Monitor tech informs the nurse that the patient is now in sinus rhythm. The patient converted at 0026 am. Harmon Pier.

## 2013-02-02 NOTE — Evaluation (Signed)
Physical Therapy Evaluation Patient Details Name: Spencer English MRN: [id number] DOB: [birth year] Today's Date: 02/02/2013 Time: 2952-8413 PT Time Calculation (min): 23 min  PT Assessment / Plan / Recommendation Clinical Impression  pt admitted with SOB primarily due to pulm edema.  On eval, note no SOB and pt did not report any fatigue although he showed mild gait deviation due to pain from sciatica.  Pt is at Indep level in home environment, but with weakness in L LE.  Pt would like to continue HHPT after D/C to work toward getting back to work.    PT Assessment  All further PT needs can be met in the next venue of care    Follow Up Recommendations  Home health PT;Other (comment) (per pt was receiving PT at home and would like to continue/)    Does the patient have the potential to tolerate intense rehabilitation      Barriers to Discharge        Equipment Recommendations  None recommended by PT    Recommendations for Other Services     Frequency      Precautions / Restrictions Precautions Precautions: None Restrictions Weight Bearing Restrictions: No   Pertinent Vitals/Pain       Mobility  Bed Mobility Bed Mobility: Supine to Sit;Sitting - Scoot to Edge of Bed Supine to Sit: 7: Independent Sitting - Scoot to [place] of Bed: 7: Independent Transfers Transfers: Sit to Stand;Stand to Sit Sit to Stand: 7: Independent Stand to Sit: 7: Independent Details for Transfer Assistance: safe mobility Ambulation/Gait Ambulation/Gait Assistance: 7: Independent Ambulation Distance (Feet): 250 Feet Assistive device: None Ambulation/Gait Assistance Details: mildly antalgic gait from ?gout flare or sciatical L Le Gait Pattern: Within Functional Limits Stairs: Yes Stairs Assistance: 6: Modified independent (Device/Increase time) Stair Management Technique: One rail Right;Alternating pattern;Step to pattern;Forwards Number of Stairs: 12     Exercises     PT Diagnosis: Abnormality  of gait;Acute pain  PT Problem List: Decreased strength PT Treatment Interventions:     PT Goals    Visit Information  Last PT Received On: 02/02/13 Assistance Needed: +1    Subjective Data  Subjective: I can walk, but it makes my sciatica flare up. Patient Stated Goal: Home I   Prior Functioning  Home Living Lives With: Spouse;Other (Comment) (child) Available Help at Discharge: Available PRN/intermittently Type of Home: House Home Access: Stairs to enter Entrance Stairs-Number of Steps: several Entrance Stairs-Rails: Right;Left Home Layout: Two level Alternate Level Stairs-Number of Steps: flight Alternate Level Stairs-Rails: Right;Left Bathroom Shower/Tub: Engineer, manufacturing systems: Standard Home Adaptive Equipment: None Prior Function Level of Independence: Independent Able to Take Stairs?: Yes Driving: Yes Vocation: Full time employment Communication Communication: No difficulties Dominant Hand: Right    Cognition  Cognition Overall Cognitive Status: Appears within functional limits for tasks assessed/performed Arousal/Alertness: Awake/alert Orientation Level: Oriented X4 / Intact Behavior During Session: WFL for tasks performed    Extremity/Trunk Assessment Right Upper Extremity Assessment RUE ROM/Strength/Tone: Within functional levels Left Upper Extremity Assessment LUE ROM/Strength/Tone: Within functional levels Right Lower Extremity Assessment RLE ROM/Strength/Tone: Within functional levels Left Lower Extremity Assessment LLE ROM/Strength/Tone: Within functional levels   Balance Balance Balance Assessed: No  End of Session PT - End of Session Activity Tolerance: Patient tolerated treatment well Patient left: with family/visitor present;Other (comment) (sitting EOB) Nurse Communication: Mobility status  GP     Arianah Torgeson, Eliseo Gum 02/02/2013, 12:55 PM  02/02/2013  [place], PT [phone] (pager)

## 2013-02-02 NOTE — Progress Notes (Signed)
OT Cancellation Note  Patient Details Name: NICKLAUS ALVIAR MRN: [id number] DOB: 1966/05/04   Cancelled Treatment:    Reason Eval/Treat Not Completed: Other (comment) OT/PT Vestibular evaluation ordered. If pt needs OT eval, please reorder OT eval/treat.  [facility] Lillieann Pavlich, OTR/L  [phone] 02/02/2013 02/02/2013, 11:45 AM

## 2013-02-03 DIAGNOSIS — N179 Acute kidney failure, unspecified: Secondary | ICD-10-CM

## 2013-02-03 LAB — GLUCOSE, CAPILLARY: Glucose-Capillary: 151 mg/dL — ABNORMAL HIGH (ref 70–99)

## 2013-02-03 LAB — COMPREHENSIVE METABOLIC PANEL
ALT: 46 U/L (ref 0–53)
AST: 30 U/L (ref 0–37)
Albumin: 2.6 g/dL — ABNORMAL LOW (ref 3.5–5.2)
CO2: 27 mEq/L (ref 19–32)
Calcium: 8.1 mg/dL — ABNORMAL LOW (ref 8.4–10.5)
GFR calc non Af Amer: 28 mL/min — ABNORMAL LOW (ref >90)
Sodium: 139 mEq/L (ref 135–145)
Total Protein: 6.3 g/dL (ref 6.0–8.3)

## 2013-02-03 LAB — CBC
HCT: 34.4 % — ABNORMAL LOW (ref 39.0–52.0)
Hemoglobin: 11 g/dL — ABNORMAL LOW (ref 13.0–17.0)
MCH: 23.4 pg — ABNORMAL LOW (ref 26.0–34.0)
MCHC: 32 g/dL (ref 30.0–36.0)
MCV: 73 fL — ABNORMAL LOW (ref 78.0–100.0)

## 2013-02-03 LAB — PROTIME-INR: INR: 3.02 — ABNORMAL HIGH (ref 0.00–1.49)

## 2013-02-03 MED ORDER — WARFARIN SODIUM 4 MG PO TABS
4.0000 mg | ORAL_TABLET | Freq: Once | ORAL | Status: AC
  Administered 2013-02-03: 4 mg via ORAL
  Filled 2013-02-03 (×2): qty 1

## 2013-02-03 MED ORDER — POTASSIUM CHLORIDE CRYS ER 20 MEQ PO TBCR
20.0000 meq | EXTENDED_RELEASE_TABLET | Freq: Once | ORAL | Status: AC
  Administered 2013-02-03: 20 meq via ORAL
  Filled 2013-02-03: qty 1

## 2013-02-03 NOTE — Progress Notes (Signed)
[facility] PROGRESS NOTE  Spencer English ZOX:[id number] DOB: Feb 15, 1966 DOA: 01/31/2013 PCP: Romero Belling, MD  Brief narrative 47 year old male with past medical history significant for diabetes, dyslipidemia, hypertension, mild pulmonary hypertension (as seen on recent 2-D echo; January 2014), GERD and atrial fibrillation; admitted on 2/4 secondary to increase shortness of breath. According to the patient since his discharge on 01/27/2013, he has been feeling SOB at rest and also on exertion. During that admission patient was diagnosed with hyperthyroidism and started on Tapazole. BNP was elevated, but patient was also noticed to have acute renal failure on past admission.  Assessment/Plan: 1. Dyspnea, possibly from acute on chronic diastolic CHF, volume resuscitation during recent admission for AKI and pulmonary hypertension: Resolved with diuresis. Continue oral Lasix. Monitor BMP closely. 2. Recent episode of acute renal failure: Creatinine slowly rising. Nephrology consulted and input appreciated. Abdominal ultrasound negative for hydronephrosis. Likely secondary to ATN from hypotension/ARB which was improving, then creatinine started worsening from diuresis. Follow BMP. 3. Paroxysmal A. fib: Continue beta blockers, verapamil and anticoagulation. Outpatient followup with Dr. Debbe Odea have to reschedule. INR 3.02. 4. Hyperthyroidism: Continue Tapazole, metoprolol and outpatient followup with endocrinology. 5. Type II DM: Reasonably controlled. Continue SSI and Linagliptin 6. Hypoglycemia: Replete and follow BMP 7. History of septic arthritis: Actually denies history of pain in knee and gives history of sciatica. 8. History of syncope, hypotension and seizure during recent hospitalization. No further episodes. 9. Anemia and thrombocytopenia: Anemia stable. Thrombocytopenia resolved. 10. Abnormal LFTs: Unclear etiology. Recent abdominal ultrasound negative for gallstones. Improving.  . 11. Pulmonary hypertension: Unclear etiology. No history of asthma or COPD. Remote history of pulmonary embolism. Currently anticoagulated for A. fib. May need outpatient evaluation. No history of OSA.  Code Status: Full Family Communication: Discussed with patient spouse at the bedside.  Disposition Plan: Home , possibly 2/7 if creatinine stable   Consultants:  None  Procedures:  None  Antibiotics:  None   HPI/Subjective: Dyspnea resolved. Able to ambulate in the halls without dyspnea. No chest pain.  Objective: Filed Vitals:   02/02/13 2125 02/02/13 2146 02/03/13 0435 02/03/13 1345  BP: 131/85 133/73 128/70 103/55  Pulse: 95 99 81 72  Temp: 98.3 F (36.8 C)  98.1 F (36.7 C) 98.5 F (36.9 C)  TempSrc: Oral  Oral Oral  Resp: 18  19 18   Height:      Weight:   104.736 kg (230 lb 14.4 oz)   SpO2: 100%  100% 97%    Intake/Output Summary (Last 24 hours) at 02/03/13 1613 Last data filed at 02/03/13 1500  Gross per 24 hour  Intake   1200 ml  Output    200 ml  Net   1000 ml   Filed Weights   02/01/13 0210 02/02/13 0440 02/03/13 0435  Weight: 105.5 kg (232 lb 9.4 oz) 102.876 kg (226 lb 12.8 oz) 104.736 kg (230 lb 14.4 oz)    Exam:   General exam: Pleasant and comfortable. Lying supine in bed.  Respiratory system: Clear. No increased work of breathing.  Cardiovascular system: S1 and S2 heard, RRR. Telemetry shows PAF. No JVD, murmurs or pedal edema.  Gastrointestinal system: Abdomen is nondistended, soft and nontender. Normal bowel sounds heard.  Central nervous system: Alert and oriented. No focal neurological deficits.  Extremities: Symmetric 5 x 5 power  Data Reviewed: Basic Metabolic Panel:  Lab 02/03/13 8119 02/02/13 0615 01/31/13 1732  NA 139 140 141  K 3.2* 3.3* 4.1  CL 101 100 105  CO2 27 29 24   GLUCOSE 104* 110* 135*  BUN 19 14 10   CREATININE 2.60* 2.26* 2.04*  CALCIUM 8.1* 7.8* 8.8  MG -- -- --  PHOS -- -- --   Liver Function  Tests:  Lab 02/03/13 0440 01/31/13 1732  AST 30 34  ALT 46 91*  ALKPHOS 119* 142*  BILITOT 0.6 1.3*  PROT 6.3 6.9  ALBUMIN 2.6* 2.9*   No results found for this basename: LIPASE:5,AMYLASE:5 in the last 168 hours No results found for this basename: AMMONIA:5 in the last 168 hours CBC:  Lab 02/03/13 0440 01/31/13 1732  WBC 7.5 8.6  NEUTROABS -- 6.0  HGB 11.0* 11.7*  HCT 34.4* 35.9*  MCV 73.0* 73.3*  PLT 208 168   Cardiac Enzymes:  Lab 02/01/13 1225 02/01/13 0505 02/01/13 0002  CKTOTAL -- -- --  CKMB -- -- --  CKMBINDEX -- -- --  TROPONINI <0.30 <0.30 <0.30   BNP (last 3 results)  Basename 01/31/13 1736  PROBNP 11423.0*   CBG:  Lab 02/03/13 1121 02/03/13 0600 02/02/13 2046 02/02/13 1651 02/02/13 1116  GLUCAP 129* 110* 126* 145* 135*    No results found for this or any previous visit (from the past 240 hour(s)).   Studies: No results found.  Scheduled Meds:    . atorvastatin  20 mg Oral Daily  . feeding supplement  1 Container Oral Q1500  . furosemide  40 mg Oral Daily  . insulin aspart  0-15 Units Subcutaneous TID WC  . isosorbide-hydrALAZINE  1 tablet Oral BID  . linagliptin  5 mg Oral Daily  . loratadine  10 mg Oral Daily  . methimazole  40 mg Oral BID  . metoprolol tartrate  12.5 mg Oral BID  . pantoprazole  40 mg Oral Daily  . potassium chloride  20 mEq Oral Once  . sodium chloride  3 mL Intravenous Q12H  . verapamil  240 mg Oral Daily  . warfarin  4 mg Oral Once  . Warfarin - Pharmacist Dosing Inpatient   Does not apply q1800   Continuous Infusions:   Principal Problem:  *Pulmonary edema Active Problems:  DM  PULMONARY EMBOLISM  Atrial fibrillation  Moderate to severe pulmonary hypertension  Hyperthyroidism    Time spent: 30 minutes    [facility]  [facility] Pager [phone]. If 8PM-8AM, please contact night-coverage at [email or web address], password [facility] 02/03/2013, 4:13 PM  LOS: 3 days

## 2013-02-03 NOTE — Consult Note (Addendum)
Spencer English is an 47 y.o. male referred by Dr Ernestina Penna   Chief Complaint: Acute renal failure HPI: 48 yo initially admitted 01/23/13 for hypotension and syncopal episode.  He was found to have acute renal failure with Scr increasing from .72 on 01/04/13 to 3.2 on 01/25/13.  Was on benicar PTA but had been DC'd during the hospitalization.   At time of DC on 01/27/13 Scr was 2.95. He is now re admitted for dyspnea and Scr 01/31/13 was 2.0 but has subsequently increased to 2.6.  UO neg > 5liters since admission.  No contrast or hypotension recorded this hospitalization.   Topical Voltaren is on his MAR but he says he has not used it.  Hx DM and HTN x 4-5 yrs  Past Medical History  Diagnosis Date  . DM 08/08/2008  . DYSLIPIDEMIA 04/26/2009  . GOUT 10/24/2010  . HYPERTENSION 07/21/2007  . PULMONARY EMBOLISM 05/03/2009  . DEEP VENOUS THROMBOPHLEBITIS, LEG, LEFT 05/05/2009  . ALLERGIC RHINITIS 04/12/2009  . UNSPECIFIED URINARY CALCULUS 07/04/2010  . 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 ECZEMA 05/23/2010  . Long term (current) use of anticoagulants 04/03/2011  . BACK PAIN, LUMBAR 01/14/2011  . 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 Hyperglycemia   . Leukopenia   . Atrial fibrillation   . GERD (gastroesophageal reflux disease)     Past Surgical History  Procedure Date  . Anterior cruciate ligament repair 2000    Right  . Knee arthroscopy     left knee  . Knee arthroscopy w/ acl reconstruction     right knee  . Cystectomy     back of head  . Knee arthroscopy 01/03/2013    Procedure: ARTHROSCOPY KNEE;  Surgeon: Thera Flake., MD;  Location: [facility];  Service: Orthopedics;  Laterality: Left;  Lavage Synovectomy, Removal of loose body    Family History  Problem Relation Age of Onset  . Heart disease Neg Hx     no premature CAD known  . Diabetes Mother   . Hypertension Mother   . Hyperlipidemia Mother   . Diabetes Father   . Hypertension Father   . Hyperlipidemia Father    Social History:  reports that he  quit smoking about 8 years ago. He has never used smokeless tobacco. He reports that he drinks alcohol. He reports that he does not use illicit drugs.  Allergies:  Allergies  Allergen Reactions  . Metformin     REACTION: nausea/diarrhea    Medications Prior to Admission  Medication Sig Dispense Refill  . acetaminophen (TYLENOL) 325 MG tablet Take 2 tablets (650 mg total) by mouth every 6 (six) hours as needed for pain.      Marland Kitchen allopurinol (ZYLOPRIM) 300 MG tablet Take 1 tablet (300 mg total) by mouth as needed. For gout flare-up.  30 tablet  11  . atorvastatin (LIPITOR) 20 MG tablet Take 1 tablet (20 mg total) by mouth daily.  90 tablet  3  . fexofenadine (ALLEGRA) 180 MG tablet Take 180 mg by mouth daily as needed. For allergies.      . fluticasone (FLONASE) 50 MCG/ACT nasal spray Place 2 sprays into the nose daily as needed. For allergies.      . methimazole (TAPAZOLE) 10 MG tablet Take 4 tablets (40 mg total) by mouth 2 (two) times daily.  240 tablet  0  . metoprolol tartrate (LOPRESSOR) 25 MG tablet Take 1 tablet (25 mg total) by mouth 2 (two) times daily.  60 tablet  0  .  oxyCODONE-acetaminophen (ROXICET) 5-325 MG per tablet 1-2 tabs po q4-6hrs prn pain  90 tablet  0  . pantoprazole (PROTONIX) 40 MG tablet Take 1 tablet (40 mg total) by mouth daily.  30 tablet  0  . sitaGLIPtin (JANUVIA) 100 MG tablet Take 1 tablet (100 mg total) by mouth daily.  30 tablet  0  . verapamil (COVERA HS) 240 MG (CO) 24 hr tablet Take 240 mg by mouth daily.       Marland Kitchen warfarin (COUMADIN) 5 MG tablet Take 7.5 mg by mouth daily.         Lab Results: UA: No protein  3-6 wbc, 0-2 rbc  Basename 02/03/13 0440 01/31/13 1732  WBC 7.5 8.6  HGB 11.0* 11.7*  HCT 34.4* 35.9*  PLT 208 168   BMET  Basename 02/03/13 0440 02/02/13 0615 01/31/13 1732  NA 139 140 141  K 3.2* 3.3* 4.1  CL 101 100 105  CO2 27 29 24   GLUCOSE 104* 110* 135*  BUN 19 14 10   CREATININE 2.60* 2.26* 2.04*  CALCIUM 8.1* 7.8* 8.8  PHOS  -- -- --   LFT  Basename 02/03/13 0440  PROT 6.3  ALBUMIN 2.6*  AST 30  ALT 46  ALKPHOS 119*  BILITOT 0.6  BILIDIR --  IBILI --   No results found.  ROS: Appetite good No SOB No CP No change in bowels No edema No neurologic sxs No vision changes No CVA pain to suggest stone   PHYSICAL EXAM: Blood pressure 128/70, pulse 81, temperature 98.1 F (36.7 C), temperature source Oral, resp. rate 19, height 6' (1.829 m), weight 104.736 kg (230 lb 14.4 oz), SpO2 100.00%. HEENT: PERRLA< EOMI NECK:No JVD LUNGS:clear CARDIAC:irreg,irreg with 1/6 systolic ABD:+ BS NTND no HSM EXT:no edema NEURO:CNI M&SI no asterixis, ox3  Assessment: 1. Acute renal failure secondary to ATN from hypotension in face of ARB with Scr that has trended up a bit possibly from diuresis 2. HTN, controlled 3. DM 4. A fib 5.  hypokalemia PLAN: 1. Decrease lasix dose as you have done 2. DC voltaren 3.  I/O's 4. Daily Scr 5. Replace K 6.  Not sure Bidil is good choice if he plans to continue to use Cialis.   Byrl Latin T 02/03/2013, 1:55 PM

## 2013-02-03 NOTE — Progress Notes (Signed)
ANTICOAGULATION CONSULT NOTE Pharmacy Consult for Coumadin Indication: atrial fibrillation and h/o PE/DVT  Allergies  Allergen Reactions  . Metformin     REACTION: nausea/diarrhea    Labs:  Basename 02/03/13 0440 02/02/13 0615 02/01/13 1225 02/01/13 0505 02/01/13 0445 02/01/13 0002 01/31/13 1732  HGB 11.0* -- -- -- -- -- 11.7*  HCT 34.4* -- -- -- -- -- 35.9*  PLT 208 -- -- -- -- -- 168  APTT -- -- -- -- -- -- --  LABPROT 29.7* 25.0* -- -- 30.5* -- --  INR 3.02* 2.39* -- -- 3.13* -- --  HEPARINUNFRC -- -- -- -- -- -- --  CREATININE 2.60* 2.26* -- -- -- -- 2.04*  CKTOTAL -- -- -- -- -- -- --  CKMB -- -- -- -- -- -- --  TROPONINI -- -- <0.30 <0.30 -- <0.30 --    Assessment: 47yo male c/o SOB x5d associated w/ nausea, VQ scan showed no acute PE, to continue Coumadin for Afib and h/o PE/DVT  Dose PTA = 7.5 mg daily  INR = 3.02  Goal of Therapy:  INR 2-3   Plan:  1) Coumadin 4 mg po x 1 dose today 2) Follow up AM INR 3) If discharge today, recommend 3.75 mg Tue,Thur, 7.5 mg other day MWFSS 4) INR follow-up next week  Thank you. Okey Regal, PharmD [phone]  02/03/2013,9:26 AM

## 2013-02-04 LAB — PROTIME-INR
INR: 3.48 — ABNORMAL HIGH (ref 0.00–1.49)
Prothrombin Time: 33 seconds — ABNORMAL HIGH (ref 11.6–15.2)

## 2013-02-04 LAB — BASIC METABOLIC PANEL
CO2: 28 mEq/L (ref 19–32)
Calcium: 8.4 mg/dL (ref 8.4–10.5)
Chloride: 103 mEq/L (ref 96–112)
GFR calc Af Amer: 44 mL/min — ABNORMAL LOW (ref >90)
Sodium: 139 mEq/L (ref 135–145)

## 2013-02-04 LAB — GLUCOSE, CAPILLARY
Glucose-Capillary: 106 mg/dL — ABNORMAL HIGH (ref 70–99)
Glucose-Capillary: 122 mg/dL — ABNORMAL HIGH (ref 70–99)

## 2013-02-04 MED ORDER — WARFARIN SODIUM 5 MG PO TABS: ORAL_TABLET | ORAL | Status: DC

## 2013-02-04 MED ORDER — FUROSEMIDE 40 MG PO TABS: 40.0000 mg | ORAL_TABLET | Freq: Every day | ORAL | Status: DC

## 2013-02-04 MED ORDER — METHIMAZOLE 10 MG PO TABS: 40.0000 mg | ORAL_TABLET | Freq: Two times a day (BID) | ORAL | Status: DC

## 2013-02-04 NOTE — Discharge Summary (Signed)
Physician Discharge Summary  Spencer English YNW:[id number] DOB: [birth year] DOA: 01/31/2013  PCP: Romero Belling, MD  Admit date: 01/31/2013 Discharge date: 02/04/2013  Time spent: Greater than 30 minutes  Recommendations for Outpatient Follow-up:  1. With Dr. Romero Belling, PCP in 1 week with labs (BMP). 2. Home Health will draw labs (PT, INR and BMP) on 02/07/13 and results will be forwarded to [facility] Coumadin clinic for dose adjustment.  Discharge Diagnoses:  Principal Problem:  *Pulmonary edema Active Problems:  DM  PULMONARY EMBOLISM  Atrial fibrillation  Moderate to severe pulmonary hypertension  Hyperthyroidism   Discharge Condition: Improved and stable.  Diet recommendation: Heart healthy.  Filed Weights   02/02/13 0440 02/03/13 0435 02/04/13 0524  Weight: 102.876 kg (226 lb 12.8 oz) 104.736 kg (230 lb 14.4 oz) 105.1 kg (231 lb 11.3 oz)    History of present illness:  47 year old male with past medical history significant for diabetes, dyslipidemia, hypertension, mild pulmonary hypertension (as seen on recent 2-D echo; January 2014), GERD and atrial fibrillation; admitted on 2/4 secondary to increase shortness of breath. According to the patient since his discharge on 01/27/2013, he has been feeling SOB at rest and also on exertion. During that admission patient was diagnosed with hyperthyroidism and started on Tapazole. BNP was elevated, but patient was also noticed to have acute renal failure on past admission.  Hospital Course:  1. Dyspnea, possibly from acute on chronic diastolic CHF, volume resuscitation during recent admission for AKI and pulmonary hypertension: Resolved with diuresis. Continue oral Lasix. Monitor BMP closely. 2. Acute renal failure: Likely secondary to ATN from hypotension in the face of ARB and diuretics during previous admission. Patient was placed on IV Lasix during this admission which was then changed to oral Lasix. His creatinine gradually increased to  2.6 on 2/6. Nephrology was consulted and Lasix was held on 2/6. His creatinine has improved. Discussed with nephrology who recommends continue current dose of Lasix 40 mg daily and continue close monitoring of BMP as outpatient.  3. Paroxysmal A. fib: Continue beta blockers, verapamil and anticoagulation. Outpatient followup with Dr. Debbe Odea have to reschedule. INR 3.48. Advised patient to hold off Coumadin for today and tomorrow and resume at lower dose on Sunday. Arrange for home health agency to draw his labs on 2/10 and forward to his Coumadin clinic for dose adjustment. Discussed with Coumadin clinic. 4. Hyperthyroidism: Continue Tapazole, metoprolol and outpatient followup with endocrinology. 5. Type II DM: Reasonably controlled. Continue SSI and Linagliptin 6. Hypokalemia: Repleted 7. History of septic arthritis: Actually denies history of pain in knee and gives history of sciatica. 8. History of syncope, hypotension and seizure during recent hospitalization. No further episodes. 9. Anemia and thrombocytopenia: Anemia stable. Thrombocytopenia resolved. 10. Abnormal LFTs: Unclear etiology. Recent abdominal ultrasound negative for gallstones. Improving. . 11. Pulmonary hypertension: Unclear etiology. No history of asthma or COPD. Remote history of pulmonary embolism. Currently anticoagulated for A. fib. May need outpatient evaluation. No history of OSA.  Procedures:  None  Consultations:  Nephrology  Discharge Exam:  Complaints Denies complaints. No dyspnea at rest or on exertion.  Filed Vitals:   02/03/13 1345 02/03/13 2038 02/03/13 2132 02/04/13 0524  BP: 103/55 132/73 131/71 141/82  Pulse: 72 92 95 86  Temp: 98.5 F (36.9 C) 98.6 F (37 C)  98.2 F (36.8 C)  TempSrc: Oral Oral  Oral  Resp: 18 18  18   Height:      Weight:    105.1 kg (231 lb  11.3 oz)  SpO2: 97% 99%  100%    General exam: Pleasant and comfortable. Lying supine in bed.  Respiratory system:  Clear. No increased work of breathing.  Cardiovascular system: S1 and S2 heard, RRR. Telemetry shows PAF. No JVD, murmurs or pedal edema.  Gastrointestinal system: Abdomen is nondistended, soft and nontender. Normal bowel sounds heard.  Central nervous system: Alert and oriented. No focal neurological deficits.  Extremities: Symmetric 5 x 5 power  Discharge Instructions      Discharge Orders    Future Orders Please Complete By Expires   Diet - low sodium heart healthy      Increase activity slowly      Call MD for:  difficulty breathing, headache or visual disturbances      (HEART FAILURE PATIENTS) Call MD:  Anytime you have any of the following symptoms: 1) 3 pound weight gain in 24 hours or 5 pounds in 1 week 2) shortness of breath, with or without a dry hacking cough 3) swelling in the hands, feet or stomach 4) if you have to sleep on extra pillows at night in order to breathe.          Medication List     As of 02/04/2013  1:58 PM    STOP taking these medications         oxyCODONE-acetaminophen 5-325 MG per tablet   Commonly known as: PERCOCET/ROXICET      TAKE these medications         acetaminophen 325 MG tablet   Commonly known as: TYLENOL   Take 2 tablets (650 mg total) by mouth every 6 (six) hours as needed for pain.      allopurinol 300 MG tablet   Commonly known as: ZYLOPRIM   Take 1 tablet (300 mg total) by mouth as needed. For gout flare-up.      atorvastatin 20 MG tablet   Commonly known as: LIPITOR   Take 1 tablet (20 mg total) by mouth daily.      fexofenadine 180 MG tablet   Commonly known as: ALLEGRA   Take 180 mg by mouth daily as needed. For allergies.      fluticasone 50 MCG/ACT nasal spray   Commonly known as: FLONASE   Place 2 sprays into the nose daily as needed. For allergies.      furosemide 40 MG tablet   Commonly known as: LASIX   Take 1 tablet (40 mg total) by mouth daily.      methimazole 10 MG tablet   Commonly known as: TAPAZOLE    Take 4 tablets (40 mg total) by mouth 2 (two) times daily.      metoprolol tartrate 25 MG tablet   Commonly known as: LOPRESSOR   Take 1 tablet (25 mg total) by mouth 2 (two) times daily.      pantoprazole 40 MG tablet   Commonly known as: PROTONIX   Take 1 tablet (40 mg total) by mouth daily.      sitaGLIPtin 100 MG tablet   Commonly known as: JANUVIA   Take 1 tablet (100 mg total) by mouth daily.      verapamil 240 MG (CO) 24 hr tablet   Commonly known as: COVERA HS   Take 240 mg by mouth daily.      warfarin 5 MG tablet   Commonly known as: COUMADIN   Do not take on 02/04/13 and 02/05/13. On 02/06/13, take half tablet (2.5 mg). From 02/07/13, dose to be adjusted  based on blood tests.         Follow-up Information    Follow up with Romero Belling, MD. Schedule an appointment as soon as possible for a visit in 1 week. (To be seen with repeat labs (BMP))    Contact information:   [address] [place] [address] [phone]       Follow up with Home Health Agency. (Will draw labs (PT, INR and BMP) on 02/07/13 and your Coumadin clinic will adjust doses.)           The results of significant diagnostics from this hospitalization (including imaging, microbiology, ancillary and laboratory) are listed below for reference.    Significant Diagnostic Studies: Dg Chest 2 View  01/31/2013  *RADIOLOGY REPORT*  Clinical Data: Shortness of breath.  History pulmonary embolus and atrial fibrillation.  CHEST - 2 VIEW  Comparison: 01/25/2013.  09/19/2012 CT.  Findings: Pulmonary vascular prominence most notable centrally. Cardiomegaly.  Small right-sided pleural effusion.  Findings may reflect changes of mild pulmonary edema.  No segmental consolidation or pneumothorax.  Slightly tortuous aorta.  IMPRESSION: Pulmonary vascular prominence most notable centrally. Cardiomegaly.  Small right-sided pleural effusion.  Findings may reflect changes of mild pulmonary edema.   Original  Report Authenticated By: Lacy Duverney, M.D.    Ct Head Wo Contrast  01/23/2013  *RADIOLOGY REPORT*  Clinical Data: Syncope; dizziness.  CT HEAD WITHOUT CONTRAST  Technique:  Contiguous axial images were obtained from the base of the skull through the vertex without contrast.  Comparison: CT of the head performed 04/01/2012  Findings: There is no evidence of acute infarction, mass lesion, or intra- or extra-axial hemorrhage on CT.  The posterior fossa, including the cerebellum, brainstem and fourth ventricle, is within normal limits.  The third and lateral ventricles, and basal ganglia are unremarkable in appearance.  The cerebral hemispheres are symmetric in appearance, with normal gray- white differentiation.  No mass effect or midline shift is seen.  There is no evidence of fracture; visualized osseous structures are unremarkable in appearance.  Exophthalmos is noted; the orbits are otherwise grossly unremarkable.  The paranasal sinuses and mastoid air cells are well-aerated.  Mild scattered soft tissue air along the frontal calvarium may reflect mild soft tissue injury.  IMPRESSION:  1.  No acute intracranial pathology seen on CT. 2.  Mild scattered soft tissue air along the frontal calvarium may reflect mild soft tissue injury. 3.  Exophthalmos noted.   Original Report Authenticated By: Tonia Ghent, M.D.    US Abdomen Complete  01/26/2013  Ultrasound of the abdomen.  The liver is normal in size and echotexture with normal hepatic portal venous flow.  The gallbladder is not distended.  There are no stones and there is no tenderness.  Its wall is slightly thickened and appears slightly edematous.  Wall thickness is 5.1 mm.  There is no intrahepatic biliary dilatation.  The common duct is normal with a diameter 2.1 mm.  The pancreas appears normal in size bilious body is well seen.  The aorta and inferior vena cava appear normal. The maximum diameter of the aorta is 2.4 cm.  Both kidneys appear somewhat  echogenic.  The right kidney is 13.0 cm in length.  Left kidney is 13.4 cm in length.  There is a 1.9 cm nonobstructing stone in the mid to lower pole of the left kidney. There are no renal masses. There is no hydronephrosis.  The spleen is normal with a height of 6.4  cm.  Impression: No definite acute findings.  No gallstones are seen. The wall the gallbladder is slightly thickened and may be edematous.  This can be due to liver disease or hypoalbuminemia states.  It coated also be due to acalculous cholecystitis but there are no additional findings to support that possibility. Echotexture of both kidneys is increased indicating medical renal disease.  Non - obstructing lower pole stone in the left kidney.   Original Report Authenticated By: Sander Radon, M.D.    Nm Pulmonary Perf And Vent  01/25/2013  *RADIOLOGY REPORT*  Clinical Data:  Syncopal episode, hypotension, elevated dilated right ventricle on echo, evaluate for pulmonary embolism  NUCLEAR MEDICINE VENTILATION - PERFUSION LUNG SCAN  Technique:  Wash-in, equilibrium, and wash-out phase ventilation images were obtained using Xe-133 gas.  Perfusion images were obtained in multiple projections after intravenous injection of Tc- 45m MAA.  Radiopharmaceuticals:  40 mCi aerosolized Tc-DTPA and 6 mCi Tc-1m MAA.  Comparison:  Chest radiograph - earlier same day  Findings:  Review of the chest radiograph performed earlier same day demonstrates an enlarged cardiac silhouette and worsening pulmonary edema.  Review of the ventilatory images demonstrates a very slight relative oligemia of the right lung in relation to the left but is negative for geographic area of non ventilation. Ingested radiotracer is seen within the superior aspect of the esophagus and stomach.  Review of the perfusion images demonstrate homogeneous distribution of injected radiotracer without geographic segmental or subsegmental mismatched filling defect.  IMPRESSION: Negative for  pulmonary embolism (very low probability for pulmonary embolism).   Original Report Authenticated By: Tacey Ruiz, MD    Dg Chest Port 1 View  01/25/2013  *RADIOLOGY REPORT*  Clinical Data: Shortness of breath, evaluate for fluid overload  PORTABLE CHEST - 1 VIEW  Comparison: 01/23/2013; 01/07/2013; 01/03/2039  Findings:  Grossly unchanged enlarged cardiac silhouette and mediastinal contours.  Likely interval retraction of right upper extremity approach PICC line with tip now projects in the superior atrial junction.  Stable positioning of right jugular approach central venous catheter.  The pulmonary vasculature is less distinct with cephalization of flow.  Worsening bibasilar opacities.  Query trace right-sided effusion.  No pneumothorax.  Unchanged bones.  IMPRESSION: 1. Interval retraction of right upper extremity approach PICC line with tip now overlying the superior cavoatrial junction. 2.  Findings suggestive of worsening pulmonary edema with possible trace right-sided effusion.   Original Report Authenticated By: Tacey Ruiz, MD    Dg Chest Portable 1 View  01/23/2013  *RADIOLOGY REPORT*  Clinical Data: Central line placement.  PORTABLE CHEST - 1 VIEW  Comparison: Chest radiograph performed earlier today at 01:00 a.m.  Findings: The right IJ line is noted ending about the proximal to mid SVC.  The right PICC appears to end within the right atrium, though was noted about the cavoatrial junction on prior studies.  The lungs are well-aerated and clear.  There is no evidence of focal opacification, pleural effusion or pneumothorax.  The cardiomediastinal silhouette is borderline enlarged.  No acute osseous abnormalities are seen.  IMPRESSION:  1.  New right IJ line noted ending about the proximal to mid SVC. 2.  The previously placed right PICC appears to end within the right atrium; this should be retracted approximately 3 cm to the cavoatrial junction, when and as deemed clinically appropriate. 3.  No  acute cardiopulmonary process seen; borderline cardiomegaly.   Original Report Authenticated By: Tonia Ghent, M.D.    [facility]  1 View  01/23/2013  *RADIOLOGY REPORT*  Clinical Data: Weakness and hypotension.  PORTABLE CHEST - 1 VIEW  Comparison: Chest radiograph performed 01/07/2013  Findings: Lung expansion is mildly decreased, though still within normal limits.  There is no evidence of focal opacification, pleural effusion or pneumothorax.  The cardiomediastinal silhouette is borderline enlarged.  No acute osseous abnormalities are seen.  The patient's right PICC is noted ending about the distal SVC.  IMPRESSION: No acute cardiopulmonary process seen.  Borderline cardiomegaly noted.   Original Report Authenticated By: Tonia Ghent, M.D.    Dg Chest Port 1 View  01/07/2013  *RADIOLOGY REPORT*  Clinical Data: Bedside PICC placement.  PORTABLE CHEST - 1 VIEW  Comparison: Portable chest x-ray 01/03/2013.  Findings: Right arm PICC tip projects over the lower SVC.  Cardiac silhouette upper normal in size for the AP portable technique, unchanged.  Interval resolution of the previously described left basilar retrocardiac opacity.  Lungs now clear.  IMPRESSION:  1.  Right arm PICC tip projects over the lower SVC. 2.  No acute cardiopulmonary disease.   Original Report Authenticated By: Hulan Saas, M.D.     Microbiology: No results found for this or any previous visit (from the past 240 hour(s)).   Labs: Basic Metabolic Panel:  Lab 02/04/13 2130 02/03/13 0440 02/02/13 0615 01/31/13 1732  NA 139 139 140 141  K 3.5 3.2* 3.3* 4.1  CL 103 101 100 105  CO2 28 27 29 24   GLUCOSE 118* 104* 110* 135*  BUN 16 19 14 10   CREATININE 2.01* 2.60* 2.26* 2.04*  CALCIUM 8.4 8.1* 7.8* 8.8  MG -- -- -- --  PHOS -- -- -- --   Liver Function Tests:  Lab 02/03/13 0440 01/31/13 1732  AST 30 34  ALT 46 91*  ALKPHOS 119* 142*  BILITOT 0.6 1.3*  PROT 6.3 6.9  ALBUMIN 2.6* 2.9*   No results found for  this basename: LIPASE:5,AMYLASE:5 in the last 168 hours No results found for this basename: AMMONIA:5 in the last 168 hours CBC:  Lab 02/03/13 0440 01/31/13 1732  WBC 7.5 8.6  NEUTROABS -- 6.0  HGB 11.0* 11.7*  HCT 34.4* 35.9*  MCV 73.0* 73.3*  PLT 208 168   Cardiac Enzymes:  Lab 02/01/13 1225 02/01/13 0505 02/01/13 0002  CKTOTAL -- -- --  CKMB -- -- --  CKMBINDEX -- -- --  TROPONINI <0.30 <0.30 <0.30   BNP: BNP (last 3 results)  Basename 01/31/13 1736  PROBNP 11423.0*   CBG:  Lab 02/04/13 1120 02/04/13 0609 02/03/13 2101 02/03/13 1635 02/03/13 1121  GLUCAP 122* 106* 151* 123* 129*   INR: 3.48 on 2/7.    Signed:  Chett Taniguchi  [facility] 02/04/2013, 1:58 PM

## 2013-02-04 NOTE — Progress Notes (Signed)
Subjective:  Feels pretty well this AM Not dyspneic  Objective:     Vital signs in last 24 hours: Filed Vitals:   02/03/13 1345 02/03/13 2038 02/03/13 2132 02/04/13 0524  BP: 103/55 132/73 131/71 141/82  Pulse: 72 92 95 86  Temp: 98.5 F (36.9 C) 98.6 F (37 C)  98.2 F (36.8 C)  TempSrc: Oral Oral  Oral  Resp: 18 18  18   Height:      Weight:    105.1 kg (231 lb 11.3 oz)  SpO2: 97% 99%  100%   Weight change: 0.365 kg (12.9 oz)  Intake/Output Summary (Last 24 hours) at 02/04/13 0817 Last data filed at 02/03/13 2231  Gross per 24 hour  Intake    360 ml  Output   2200 ml  Net  -1840 ml    Physical Exam:  Blood pressure 141/82, pulse 86, temperature 98.2 F (36.8 C), temperature source Oral, resp. rate 18, height 6' (1.829 m), weight 105.1 kg (231 lb 11.3 oz), SpO2 100.00%. Lungs clear No JVD No sig edema LE's  Labs:   Lab 02/04/13 0445 02/03/13 0440 02/02/13 0615 01/31/13 1732  NA 139 139 140 141  K 3.5 3.2* 3.3* 4.1  CL 103 101 100 105  CO2 28 27 29 24   GLUCOSE 118* 104* 110* 135*  BUN 16 19 14 10   CREATININE 2.01* 2.60* 2.26* 2.04*  ALB -- -- -- --  CALCIUM 8.4 8.1* 7.8* 8.8  PHOS -- -- -- --     Lab 02/03/13 0440 01/31/13 1732  AST 30 34  ALT 46 91*  ALKPHOS 119* 142*  BILITOT 0.6 1.3*  PROT 6.3 6.9  ALBUMIN 2.6* 2.9*   No results found for this basename: LIPASE:3,AMYLASE:3 in the last 168 hours No results found for this basename: AMMONIA:3 in the last 168 hours   Lab 02/03/13 0440 01/31/13 1732  WBC 7.5 8.6  NEUTROABS -- 6.0  HGB 11.0* 11.7*  HCT 34.4* 35.9*  MCV 73.0* 73.3*  PLT 208 168    @labrcntip (inr:5)   Lab 02/01/13 1225 02/01/13 0505 02/01/13 0002  CKTOTAL -- -- --  CKMB -- -- --  CKMBINDEX -- -- --  TROPONINI <0.30 <0.30 <0.30     Lab 02/04/13 0609 02/03/13 2101 02/03/13 1635 02/03/13 1121 02/03/13 0600  GLUCAP 106* 151* 123* 129* 110*     No results found for this basename: IRON:30,TIBC:30,TRANSFERRIN:30,FERRITIN:30  in the last 168 hours  Studies/Results: No results found.       Marland Kitchen atorvastatin  20 mg Oral Daily  . feeding supplement  1 Container Oral Q1500  . furosemide  40 mg Oral Daily  . insulin aspart  0-15 Units Subcutaneous TID WC  . isosorbide-hydrALAZINE  1 tablet Oral BID  . linagliptin  5 mg Oral Daily  . loratadine  10 mg Oral Daily  . methimazole  40 mg Oral BID  . metoprolol tartrate  12.5 mg Oral BID  . pantoprazole  40 mg Oral Daily  . sodium chloride  3 mL Intravenous Q12H  . verapamil  240 mg Oral Daily  . Warfarin - Pharmacist Dosing Inpatient   Does not apply q1800     I  have reviewed scheduled and prn medications.  ASSESSMENT/RECOMMENDATIONS  1. Acute renal failure secondary to ATN from hypotension in face of ARB with Scr that trended up a bit possibly from diuresis 2. HTN, controlled 3. DM 4. A fib 5. Hypokalemia  REC:  1. Creatinine trending down now with  reduction in lasix, D/C of voltaren (though not clear if he was using).   Good urine output.  Expect renal function will continue to improve;  Potassium better post repletion  2. Continue I/O's/daily creatinine - if continues to fall expect d/c next day or so? No additional recommendations at this time  3. Not sure Bidil is good choice if he plans to continue to use Cialis.   Camille Bal, MD [facility] [phone] Pager 02/04/2013, 8:17 AM

## 2013-02-04 NOTE — Progress Notes (Signed)
Discharged to home with family office visits in place teaching done  

## 2013-02-04 NOTE — Progress Notes (Signed)
ANTICOAGULATION CONSULT NOTE - Follow Up Consult  Pharmacy Consult for Coumadin Indication: atrial fibrillation  Allergies  Allergen Reactions  . Metformin     REACTION: nausea/diarrhea    Patient Measurements: Height: 6' (182.9 cm) Weight: 231 lb 11.3 oz (105.1 kg) IBW/kg (Calculated) : 77.6  Heparin Dosing Weight:   Vital Signs: Temp: 98.2 F (36.8 C) (02/07 0524) Temp src: Oral (02/07 0524) BP: 141/82 mmHg (02/07 0524) Pulse Rate: 86  (02/07 0524)  Labs:  Basename 02/04/13 0445 02/03/13 0440 02/02/13 0615 02/01/13 1225  HGB -- 11.0* -- --  HCT -- 34.4* -- --  PLT -- 208 -- --  APTT -- -- -- --  LABPROT 33.0* 29.7* 25.0* --  INR 3.48* 3.02* 2.39* --  HEPARINUNFRC -- -- -- --  CREATININE 2.01* 2.60* 2.26* --  CKTOTAL -- -- -- --  CKMB -- -- -- --  TROPONINI -- -- -- <0.30    Estimated Creatinine Clearance: 56.9 ml/min (by C-G formula based on Cr of 2.01).   Medications:  Scheduled:    . atorvastatin  20 mg Oral Daily  . feeding supplement  1 Container Oral Q1500  . furosemide  40 mg Oral Daily  . insulin aspart  0-15 Units Subcutaneous TID WC  . isosorbide-hydrALAZINE  1 tablet Oral BID  . linagliptin  5 mg Oral Daily  . loratadine  10 mg Oral Daily  . methimazole  40 mg Oral BID  . metoprolol tartrate  12.5 mg Oral BID  . pantoprazole  40 mg Oral Daily  . [COMPLETED] potassium chloride  20 mEq Oral Once  . sodium chloride  3 mL Intravenous Q12H  . verapamil  240 mg Oral Daily  . [COMPLETED] warfarin  4 mg Oral Once  . Warfarin - Pharmacist Dosing Inpatient   Does not apply q1800  . [DISCONTINUED] diclofenac sodium  2 g Topical QID    Assessment: 47yo male with AFib and hx PE/DVT, with (-)VQ scan this admission.  INR up to 3.48 this AM, a large jump over 2 days and received 7.5mg , 4mg  last 2 days.  Today's INR is more reflective of Wed's 7.5mg  dose than yesterday's 4mg  dose.  Therefore, expect INR to increase again tomorrow.  No bleeding problems  noted.  Plan to d/c home today.  Goal of Therapy:  INR 2-3 Monitor platelets by anticoagulation protocol: Yes   Plan:  1.  No Coumadin today nor tomorrow. 2.  Recommend Coumadin 2.5mg  (1/2 of 5mg  tabs that pt has at home) on Sunday 3.  Repeat INR on Monday 2/10 with further dosing based on this result.  Marisue Humble, PharmD Clinical Pharmacist [facility]

## 2013-02-11 ENCOUNTER — Ambulatory Visit: Payer: BC Managed Care – PPO | Admitting: Endocrinology

## 2013-02-15 ENCOUNTER — Ambulatory Visit: Payer: Self-pay | Admitting: Pharmacist

## 2013-02-15 ENCOUNTER — Telehealth: Payer: Self-pay

## 2013-02-15 DIAGNOSIS — I2699 Other pulmonary embolism without acute cor pulmonale: Secondary | ICD-10-CM

## 2013-02-15 DIAGNOSIS — I82409 Acute embolism and thrombosis of unspecified deep veins of unspecified lower extremity: Secondary | ICD-10-CM

## 2013-02-15 DIAGNOSIS — Z7901 Long term (current) use of anticoagulants: Secondary | ICD-10-CM

## 2013-02-15 NOTE — Telephone Encounter (Signed)
Ov is due.  i'm not sure which labs pt refers to, but i'll go over any pt wants when he is here.

## 2013-02-15 NOTE — Telephone Encounter (Signed)
Pt's wife called requesting pt's lab results, [phone] ext [phone], or cell # [phone], please advise

## 2013-02-17 ENCOUNTER — Telehealth: Payer: Self-pay | Admitting: Endocrinology

## 2013-02-17 NOTE — Telephone Encounter (Signed)
Pt's wife advised per Dr. Everardo All pt needs to come in for an ov, pt will come in on Friday 02/18/13

## 2013-02-17 NOTE — Telephone Encounter (Signed)
The patient's wife called to request return call from Dr. George Hugh nurse.  Please return call to [facility]  [phone] or [phone]

## 2013-02-18 ENCOUNTER — Other Ambulatory Visit: Payer: Self-pay | Admitting: Endocrinology

## 2013-02-18 ENCOUNTER — Ambulatory Visit (INDEPENDENT_AMBULATORY_CARE_PROVIDER_SITE_OTHER): Payer: BC Managed Care – PPO | Admitting: Endocrinology

## 2013-02-18 VITALS — BP 126/80 | HR 76 | Wt 233.0 lb

## 2013-02-18 DIAGNOSIS — N179 Acute kidney failure, unspecified: Secondary | ICD-10-CM

## 2013-02-18 DIAGNOSIS — N529 Male erectile dysfunction, unspecified: Secondary | ICD-10-CM

## 2013-02-18 DIAGNOSIS — E0590 Thyrotoxicosis, unspecified without thyrotoxic crisis or storm: Secondary | ICD-10-CM

## 2013-02-18 DIAGNOSIS — I4891 Unspecified atrial fibrillation: Secondary | ICD-10-CM

## 2013-02-18 DIAGNOSIS — I2699 Other pulmonary embolism without acute cor pulmonale: Secondary | ICD-10-CM

## 2013-02-18 DIAGNOSIS — E119 Type 2 diabetes mellitus without complications: Secondary | ICD-10-CM

## 2013-02-18 LAB — BASIC METABOLIC PANEL
Potassium: 3.4 mEq/L — ABNORMAL LOW (ref 3.5–5.3)
Sodium: 140 mEq/L (ref 135–145)

## 2013-02-18 LAB — HEMOGLOBIN A1C
Hgb A1c MFr Bld: 7.7 % — ABNORMAL HIGH (ref <5.7)
Mean Plasma Glucose: 174 mg/dL — ABNORMAL HIGH (ref <117)

## 2013-02-18 MED ORDER — FUROSEMIDE 20 MG PO TABS: 20.0000 mg | ORAL_TABLET | Freq: Every day | ORAL | Status: DC

## 2013-02-18 NOTE — Patient Instructions (Addendum)
Refer to a heart rhythm specialist, and a lung specialist.  you will receive phone calls, about days and times for appointments.   Please stay-off the benicar for now. blood tests are being requested for you today.  We'll contact you with results. Based on the results, we may be able to stop the diabetes pill. if ever you have fever while taking methimazole, stop it and call us, because of the risk of a rare side-effect Please come back for a follow-up appointment in 1 month. Reduce lasix to 20 mg daily.

## 2013-02-18 NOTE — Progress Notes (Signed)
Subjective:    Patient ID: Spencer English, male    DOB: 1965/12/30, 47 y.o.   MRN: [id number]  HPI The state of at least three ongoing medical problems is addressed today, with interval history of each noted here: Hyperthyroidism: was dx'ed in the hospital.  Tremor is improved, but it persists. AF: he denies palpitations.   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  Denies sob.  Renal failure: worsened in the hospital. Denies dysuria. Past Medical History  Diagnosis Date  . DM 08/08/2008  . DYSLIPIDEMIA 04/26/2009  . GOUT 10/24/2010  . HYPERTENSION 07/21/2007  . PULMONARY EMBOLISM 05/03/2009  . DEEP VENOUS THROMBOPHLEBITIS, LEG, LEFT 05/05/2009  . ALLERGIC RHINITIS 04/12/2009  . UNSPECIFIED URINARY CALCULUS 07/04/2010  . 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 ECZEMA 05/23/2010  . Long term (current) use of anticoagulants 04/03/2011  . BACK PAIN, LUMBAR 01/14/2011  . 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 Hyperglycemia   . Leukopenia   . Atrial fibrillation   . GERD (gastroesophageal reflux disease)     Past Surgical History  Procedure Laterality Date  . Anterior cruciate ligament repair  2000    Right  . Knee arthroscopy      left knee  . Knee arthroscopy w/ acl reconstruction      right knee  . Cystectomy      back of head  . Knee arthroscopy  01/03/2013    Procedure: ARTHROSCOPY KNEE;  Surgeon: Thera Flake., MD;  [facility]: [facility] OR;  Service: Orthopedics;  Laterality: Left;  Lavage Synovectomy, Removal of loose body    History   Social History  . Marital Status: Legally Separated    Spouse Name: N/A    Number of Children: 3  . 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     Social History Main Topics  . Smoking status: Former Smoker    Quit date: 01/07/2005  . Smokeless tobacco: Never Used  . Alcohol Use: Yes     Comment: Occ.  . Drug Use: No  . Sexually Active:    Other Topics Concern  . Not on file   Social History Narrative  . No narrative  on file    Current Outpatient Prescriptions on File Prior to Visit  Medication Sig Dispense Refill  . acetaminophen (TYLENOL) 325 MG tablet Take 2 tablets (650 mg total) by mouth every 6 (six) hours as needed for pain.      Marland Kitchen allopurinol (ZYLOPRIM) 300 MG tablet Take 1 tablet (300 mg total) by mouth as needed. For gout flare-up.  30 tablet  11  . atorvastatin (LIPITOR) 20 MG tablet Take 1 tablet (20 mg total) by mouth daily.  90 tablet  3  . fexofenadine (ALLEGRA) 180 MG tablet Take 180 mg by mouth daily as needed. For allergies.      . fluticasone (FLONASE) 50 MCG/ACT nasal spray Place 2 sprays into the nose daily as needed. For allergies.      . metoprolol tartrate (LOPRESSOR) 25 MG tablet Take 1 tablet (25 mg total) by mouth 2 (two) times daily.  60 tablet  0  . pantoprazole (PROTONIX) 40 MG tablet Take 1 tablet (40 mg total) by mouth daily.  30 tablet  0  . sitaGLIPtin (JANUVIA) 100 MG tablet Take 1 tablet (100 mg total) by mouth daily.  30 tablet  0  . verapamil (COVERA HS) 240 MG (CO) 24 hr tablet Take 240 mg by mouth daily.       Marland Kitchen  warfarin (COUMADIN) 5 MG tablet Do not take on 02/04/13 and 02/05/13. On 02/06/13, take half tablet (2.5 mg). From 02/07/13, dose to be adjusted based on blood tests.  30 tablet  0   No current facility-administered medications on file prior to visit.    Allergies  Allergen Reactions  . Metformin     REACTION: nausea/diarrhea    Family History  Problem Relation Age of Onset  . Heart disease Neg Hx     no premature CAD known  . Diabetes Mother   . Hypertension Mother   . Hyperlipidemia Mother   . Diabetes Father   . Hypertension Father   . Hyperlipidemia Father     BP 126/80  Pulse 76  Wt 233 lb (105.688 kg)  BMI 31.59 kg/m2  SpO2 98%     Review of Systems He has ED sxs.  He has lost weight    Objective:   Physical Exam VITAL SIGNS:  See vs page GENERAL: no distress head: no deformity eyes: there is bilat proptosis external nose and  ears are normal mouth: no lesion seen Neck: thyroid is slightly enlarged on the right, and normal on the left.  Lab Results  Component Value Date   WBC 7.5 02/03/2013   HGB 11.0* 02/03/2013   HCT 34.4* 02/03/2013   PLT 208 02/03/2013   GLUCOSE 91 02/18/2013   CHOL 214* 09/30/2011   TRIG 94.0 09/30/2011   HDL 49.10 09/30/2011   LDLDIRECT 149.2 09/30/2011   LDLCALC 108* 07/04/2010   ALT 46 02/03/2013   AST 30 02/03/2013   NA 140 02/18/2013   K 3.4* 02/18/2013   CL 103 02/18/2013   CREATININE 1.12 02/18/2013   BUN 9 02/18/2013   CO2 27 02/18/2013   TSH <0.008* 02/18/2013   PSA 0.78 07/04/2010   INR 1.0 02/14/2013   HGBA1C 7.7* 02/18/2013   MICROALBUR 1.5 07/04/2010      Assessment & Plan:  Hyperthyroidism, improved Renal failure, resolved AF: he needs cardiol f/u, although this might resolve ED sxs, recurrent.  Testosterone is normal pulm emboli.  clinically better, but he needs pulm f/u DM: a1c is slightly high, but we'll continue same rx for now, in view of his recent illness

## 2013-02-18 NOTE — ED Provider Notes (Signed)
Medical screening examination/treatment/procedure(s) were conducted as a shared visit with non-physician practitioner(s) and myself.  I personally evaluated the patient during the encounter  Glynn Octave, MD 02/18/13 [phone]

## 2013-02-19 LAB — TESTOSTERONE: Testosterone: 735 ng/dL (ref 300–890)

## 2013-02-21 ENCOUNTER — Telehealth: Payer: Self-pay | Admitting: Endocrinology

## 2013-02-21 NOTE — Telephone Encounter (Signed)
This is reports to coumadin clinic, not to me

## 2013-02-21 NOTE — Telephone Encounter (Signed)
Kim with home health advised this report should go to the coumadin clinic

## 2013-02-21 NOTE — Telephone Encounter (Signed)
Home health nurse calling with INR 2.0 PT 24.3 collected today 02/21/13. They will be discharging patient today from home health services. Please call if any questions.

## 2013-02-22 ENCOUNTER — Encounter: Payer: Self-pay | Admitting: Infectious Diseases

## 2013-02-22 ENCOUNTER — Ambulatory Visit (INDEPENDENT_AMBULATORY_CARE_PROVIDER_SITE_OTHER): Payer: BC Managed Care – PPO | Admitting: General Practice

## 2013-02-22 DIAGNOSIS — I82409 Acute embolism and thrombosis of unspecified deep veins of unspecified lower extremity: Secondary | ICD-10-CM

## 2013-02-22 DIAGNOSIS — Z7901 Long term (current) use of anticoagulants: Secondary | ICD-10-CM

## 2013-02-22 DIAGNOSIS — I2699 Other pulmonary embolism without acute cor pulmonale: Secondary | ICD-10-CM

## 2013-02-22 LAB — POCT INR: INR: 2

## 2013-03-02 ENCOUNTER — Encounter: Payer: Self-pay | Admitting: Infectious Diseases

## 2013-03-09 ENCOUNTER — Institutional Professional Consult (permissible substitution): Payer: BC Managed Care – PPO | Admitting: Pulmonary Disease

## 2013-03-11 ENCOUNTER — Encounter: Payer: Self-pay | Admitting: Internal Medicine

## 2013-03-11 ENCOUNTER — Ambulatory Visit (INDEPENDENT_AMBULATORY_CARE_PROVIDER_SITE_OTHER): Payer: BC Managed Care – PPO | Admitting: Internal Medicine

## 2013-03-11 ENCOUNTER — Ambulatory Visit (INDEPENDENT_AMBULATORY_CARE_PROVIDER_SITE_OTHER): Payer: BC Managed Care – PPO | Admitting: *Deleted

## 2013-03-11 VITALS — BP 141/84 | HR 81 | Ht 72.0 in | Wt 242.0 lb

## 2013-03-11 DIAGNOSIS — I82409 Acute embolism and thrombosis of unspecified deep veins of unspecified lower extremity: Secondary | ICD-10-CM

## 2013-03-11 DIAGNOSIS — I2699 Other pulmonary embolism without acute cor pulmonale: Secondary | ICD-10-CM

## 2013-03-11 DIAGNOSIS — I10 Essential (primary) hypertension: Secondary | ICD-10-CM

## 2013-03-11 DIAGNOSIS — I4891 Unspecified atrial fibrillation: Secondary | ICD-10-CM

## 2013-03-11 DIAGNOSIS — Z7901 Long term (current) use of anticoagulants: Secondary | ICD-10-CM

## 2013-03-11 LAB — POCT INR: INR: 2.6

## 2013-03-11 NOTE — Patient Instructions (Addendum)
Your physician recommends that you schedule a follow-up appointment in 3 months with Dr Allred    

## 2013-03-18 ENCOUNTER — Ambulatory Visit: Payer: BC Managed Care – PPO | Admitting: Endocrinology

## 2013-03-24 ENCOUNTER — Ambulatory Visit: Payer: BC Managed Care – PPO | Admitting: Endocrinology

## 2013-03-24 DIAGNOSIS — Z0289 Encounter for other administrative examinations: Secondary | ICD-10-CM

## 2013-03-27 ENCOUNTER — Encounter: Payer: Self-pay | Admitting: Internal Medicine

## 2013-03-27 NOTE — Assessment & Plan Note (Signed)
Stable No change required today  

## 2013-03-27 NOTE — Assessment & Plan Note (Signed)
Records including recent echo and hospital records are reviewed. He has atrial fibrillation likely secondary to hyperthyroidism.  He has prior PTE and pulmonary hypertension which could also be a factor. Therapeutic strategies for afib including medicine and ablation were discussed in detail with the patient today. At this point, I think that we should continue to direct our efforts at management of his hyperthyroidism and then reassess in 3 months. No changes are made today. He will require long term anticoagulation.

## 2013-03-27 NOTE — Assessment & Plan Note (Signed)
Continue long term anticoagulation.  

## 2013-03-27 NOTE — Progress Notes (Signed)
Primary Care Physician: Romero Belling, MD Referring Physician:  Dr [place]   Spencer English is a 47 y.o. male with a h/o paroxysmal atrial fibrillation who presents for EP consultation.  He previously presented to Dr [place]il[place] complaining of daily palpitations.  He wore an event monitor which revealed afib and atrial flutter with elevated ventricular rates.  He reported associated edema.  He was referred to EP for further management.  Since that time, he has been diagnosed with hyperthyroidism.  He has been treated under the direction of Dr Everardo All and feels that his afib is much improved presently.  His palpitations are much improved. Today, he denies symptoms of chest pain, shortness of breath, orthopnea, PND, lower extremity edema, dizziness, presyncope, syncope, or neurologic sequela. The patient is tolerating medications without difficulties and is otherwise without complaint today.   Past Medical History  Diagnosis Date  . DM 08/08/2008  . DYSLIPIDEMIA 04/26/2009  . GOUT 10/24/2010  . HYPERTENSION 07/21/2007  . PULMONARY EMBOLISM 05/03/2009  . DEEP VENOUS THROMBOPHLEBITIS, LEG, LEFT 05/05/2009  . ALLERGIC RHINITIS 04/12/2009  . UNSPECIFIED URINARY CALCULUS 07/04/2010  . 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 ECZEMA 05/23/2010  . Long term (current) use of anticoagulants 04/03/2011  . BACK PAIN, LUMBAR 01/14/2011  . 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 Hyperglycemia   . Leukopenia   . Atrial fibrillation   . GERD (gastroesophageal reflux disease)    Past Surgical History  Procedure Laterality Date  . Anterior cruciate ligament repair  2000    Right  . Knee arthroscopy      left knee  . Knee arthroscopy w/ acl reconstruction      right knee  . Cystectomy      back of head  . Knee arthroscopy  01/03/2013    Procedure: ARTHROSCOPY KNEE;  Surgeon: Thera Flake., MD;  Location: [facility] OR;  Service: Orthopedics;  Laterality: Left;  Lavage Synovectomy, Removal of loose body    Current Outpatient  Prescriptions  Medication Sig Dispense Refill  . acetaminophen (TYLENOL) 325 MG tablet Take 2 tablets (650 mg total) by mouth every 6 (six) hours as needed for pain.      Marland Kitchen allopurinol (ZYLOPRIM) 300 MG tablet Take 1 tablet (300 mg total) by mouth as needed. For gout flare-up.  30 tablet  11  . atorvastatin (LIPITOR) 20 MG tablet Take 1 tablet (20 mg total) by mouth daily.  90 tablet  3  . fexofenadine (ALLEGRA) 180 MG tablet Take 180 mg by mouth daily as needed. For allergies.      . fluticasone (FLONASE) 50 MCG/ACT nasal spray Place 2 sprays into the nose daily as needed. For allergies.      . furosemide (LASIX) 20 MG tablet Take 1 tablet (20 mg total) by mouth daily.  30 tablet  11  . methimazole (TAPAZOLE) 10 MG tablet Take 20 mg by mouth 2 (two) times daily.      . metoprolol tartrate (LOPRESSOR) 25 MG tablet Take 1 tablet (25 mg total) by mouth 2 (two) times daily.  60 tablet  0  . verapamil (COVERA HS) 240 MG (CO) 24 hr tablet Take 240 mg by mouth daily.       Marland Kitchen warfarin (COUMADIN) 5 MG tablet Do not take on 02/04/13 and 02/05/13. On 02/06/13, take half tablet (2.5 mg). From 02/07/13, dose to be adjusted based on blood tests.  30 tablet  0  . pantoprazole (PROTONIX) 40 MG tablet Take 1 tablet (40 mg total)  by mouth daily.  30 tablet  0  . sitaGLIPtin (JANUVIA) 100 MG tablet Take 1 tablet (100 mg total) by mouth daily.  30 tablet  0   No current facility-administered medications for this visit.    Allergies  Allergen Reactions  . Metformin     REACTION: nausea/diarrhea    History   Social History  . Marital Status: Legally Separated    Spouse Name: N/A    Number of Children: 3  . 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     Social History Main Topics  . Smoking status: Former Smoker    Quit date: 01/07/2005  . Smokeless tobacco: Never Used  . Alcohol Use: Yes     Comment: Occ.  . Drug Use: No  . Sexually Active:    Other Topics Concern  . Not on  file   Social History Narrative  . No narrative on file    Family History  Problem Relation Age of Onset  . Heart disease Neg Hx     no premature CAD known  . Diabetes Mother   . Hypertension Mother   . Hyperlipidemia Mother   . Diabetes Father   . Hypertension Father   . Hyperlipidemia Father     ROS- All systems are reviewed and negative except as per the HPI above  Physical Exam: Filed Vitals:   03/11/13 1558  BP: 141/84  Pulse: 81  Height: 6' (1.829 m)  Weight: 242 lb (109.77 kg)    GEN- The patient is well appearing, alert and oriented x 3 today.   Head- normocephalic, atraumatic Eyes-  Sclera clear, conjunctiva pink Ears- hearing intact Oropharynx- clear Neck- supple, no JVP Lymph- no cervical lymphadenopathy Lungs- Clear to ausculation bilaterally, normal work of breathing Heart- Regular rate and rhythm, no murmurs, rubs or gallops, PMI not laterally displaced GI- soft, NT, ND, + BS Extremities- no clubbing, cyanosis, or edema MS- no significant deformity or atrophy Skin- no rash or lesion Psych- euthymic mood, full affect Neuro- strength and sensation are intact  EKG today reveals sinus rhythm 77 bpm, nonspecific St/ T changes  Assessment and Plan:

## 2013-03-30 ENCOUNTER — Telehealth: Payer: Self-pay | Admitting: Endocrinology

## 2013-03-30 NOTE — Telephone Encounter (Signed)
Received 2 pages Spencer English, sent to Dr. Everardo All. 03/30/13/ss

## 2013-04-04 ENCOUNTER — Institutional Professional Consult (permissible substitution): Payer: BC Managed Care – PPO | Admitting: Pulmonary Disease

## 2013-05-06 ENCOUNTER — Other Ambulatory Visit: Payer: Self-pay | Admitting: *Deleted

## 2013-05-06 MED ORDER — WARFARIN SODIUM 5 MG PO TABS: ORAL_TABLET | ORAL | Status: DC

## 2013-05-06 NOTE — Telephone Encounter (Signed)
Patient called and requested warfarin

## 2013-05-12 ENCOUNTER — Ambulatory Visit (INDEPENDENT_AMBULATORY_CARE_PROVIDER_SITE_OTHER): Payer: BC Managed Care – PPO

## 2013-05-12 DIAGNOSIS — I2699 Other pulmonary embolism without acute cor pulmonale: Secondary | ICD-10-CM

## 2013-05-12 DIAGNOSIS — I82409 Acute embolism and thrombosis of unspecified deep veins of unspecified lower extremity: Secondary | ICD-10-CM

## 2013-05-12 DIAGNOSIS — Z7901 Long term (current) use of anticoagulants: Secondary | ICD-10-CM

## 2013-05-12 MED ORDER — WARFARIN SODIUM 5 MG PO TABS: ORAL_TABLET | ORAL | Status: DC

## 2013-06-01 ENCOUNTER — Other Ambulatory Visit: Payer: Self-pay

## 2013-06-01 MED ORDER — METHIMAZOLE 10 MG PO TABS: 20.0000 mg | ORAL_TABLET | Freq: Two times a day (BID) | ORAL | Status: DC

## 2013-06-01 MED ORDER — METOPROLOL TARTRATE 25 MG PO TABS: 25.0000 mg | ORAL_TABLET | Freq: Two times a day (BID) | ORAL | Status: DC

## 2013-06-20 ENCOUNTER — Ambulatory Visit: Payer: BC Managed Care – PPO | Admitting: Internal Medicine

## 2013-06-21 ENCOUNTER — Encounter: Payer: Self-pay | Admitting: Internal Medicine

## 2013-06-29 ENCOUNTER — Other Ambulatory Visit: Payer: Self-pay | Admitting: *Deleted

## 2013-06-29 MED ORDER — HALOBETASOL PROPIONATE 0.05 % EX CREA: TOPICAL_CREAM | CUTANEOUS | Status: DC

## 2013-08-08 ENCOUNTER — Other Ambulatory Visit: Payer: Self-pay | Admitting: *Deleted

## 2013-08-08 MED ORDER — METHIMAZOLE 10 MG PO TABS: 20.0000 mg | ORAL_TABLET | Freq: Two times a day (BID) | ORAL | Status: DC

## 2013-09-14 ENCOUNTER — Encounter: Payer: BC Managed Care – PPO | Admitting: Endocrinology

## 2013-09-14 DIAGNOSIS — Z0289 Encounter for other administrative examinations: Secondary | ICD-10-CM

## 2013-09-23 ENCOUNTER — Telehealth: Payer: Self-pay | Admitting: Endocrinology

## 2013-09-23 NOTE — Telephone Encounter (Signed)
Pt advised he needs to make a f/u appt in order to get a referral, pt states he will call back

## 2013-09-25 ENCOUNTER — Other Ambulatory Visit: Payer: Self-pay | Admitting: Internal Medicine

## 2013-09-26 ENCOUNTER — Ambulatory Visit (INDEPENDENT_AMBULATORY_CARE_PROVIDER_SITE_OTHER): Payer: BC Managed Care – PPO | Admitting: *Deleted

## 2013-09-26 DIAGNOSIS — I2699 Other pulmonary embolism without acute cor pulmonale: Secondary | ICD-10-CM

## 2013-09-26 DIAGNOSIS — Z7901 Long term (current) use of anticoagulants: Secondary | ICD-10-CM

## 2013-09-26 DIAGNOSIS — I82409 Acute embolism and thrombosis of unspecified deep veins of unspecified lower extremity: Secondary | ICD-10-CM

## 2013-09-26 LAB — POCT INR: INR: 1.2

## 2013-09-26 MED ORDER — WARFARIN SODIUM 5 MG PO TABS: ORAL_TABLET | ORAL | Status: DC

## 2013-09-26 NOTE — Telephone Encounter (Signed)
Will fwd to Dr Ellison. 

## 2013-09-26 NOTE — Telephone Encounter (Signed)
Refill x 1 cpx is due 

## 2013-10-03 ENCOUNTER — Other Ambulatory Visit: Payer: Self-pay | Admitting: Internal Medicine

## 2013-10-10 ENCOUNTER — Ambulatory Visit (INDEPENDENT_AMBULATORY_CARE_PROVIDER_SITE_OTHER): Payer: BC Managed Care – PPO | Admitting: Endocrinology

## 2013-10-10 ENCOUNTER — Encounter: Payer: Self-pay | Admitting: Endocrinology

## 2013-10-10 VITALS — BP 134/80 | HR 80 | Ht 72.0 in | Wt 271.0 lb

## 2013-10-10 DIAGNOSIS — Z23 Encounter for immunization: Secondary | ICD-10-CM

## 2013-10-10 DIAGNOSIS — I10 Essential (primary) hypertension: Secondary | ICD-10-CM

## 2013-10-10 DIAGNOSIS — M109 Gout, unspecified: Secondary | ICD-10-CM

## 2013-10-10 DIAGNOSIS — E0590 Thyrotoxicosis, unspecified without thyrotoxic crisis or storm: Secondary | ICD-10-CM

## 2013-10-10 DIAGNOSIS — Z125 Encounter for screening for malignant neoplasm of prostate: Secondary | ICD-10-CM

## 2013-10-10 DIAGNOSIS — N179 Acute kidney failure, unspecified: Secondary | ICD-10-CM

## 2013-10-10 DIAGNOSIS — E785 Hyperlipidemia, unspecified: Secondary | ICD-10-CM

## 2013-10-10 DIAGNOSIS — D696 Thrombocytopenia, unspecified: Secondary | ICD-10-CM

## 2013-10-10 DIAGNOSIS — I129 Hypertensive chronic kidney disease with stage 1 through stage 4 chronic kidney disease, or unspecified chronic kidney disease: Secondary | ICD-10-CM

## 2013-10-10 DIAGNOSIS — R7989 Other specified abnormal findings of blood chemistry: Secondary | ICD-10-CM

## 2013-10-10 DIAGNOSIS — I4891 Unspecified atrial fibrillation: Secondary | ICD-10-CM

## 2013-10-10 DIAGNOSIS — E119 Type 2 diabetes mellitus without complications: Secondary | ICD-10-CM

## 2013-10-10 DIAGNOSIS — Z79899 Other long term (current) drug therapy: Secondary | ICD-10-CM

## 2013-10-10 LAB — CBC WITH DIFFERENTIAL/PLATELET
Basophils Absolute: 0.1 10*3/uL (ref 0.0–0.1)
Eosinophils Absolute: 0.3 10*3/uL (ref 0.0–0.7)
HCT: 42.9 % (ref 39.0–52.0)
Lymphs Abs: 2.1 10*3/uL (ref 0.7–4.0)
MCHC: 32.5 g/dL (ref 30.0–36.0)
MCV: 86.5 fl (ref 78.0–100.0)
Monocytes Absolute: 0.6 10*3/uL (ref 0.1–1.0)
Monocytes Relative: 9.4 % (ref 3.0–12.0)
Neutrophils Relative %: 53.1 % (ref 43.0–77.0)
Platelets: 191 10*3/uL (ref 150.0–400.0)
RBC: 4.96 Mil/uL (ref 4.22–5.81)
RDW: 16.7 % — ABNORMAL HIGH (ref 11.5–14.6)

## 2013-10-10 LAB — URINALYSIS, ROUTINE W REFLEX MICROSCOPIC
Bilirubin Urine: NEGATIVE
Hgb urine dipstick: NEGATIVE
Leukocytes, UA: NEGATIVE
Nitrite: NEGATIVE
Total Protein, Urine: NEGATIVE
Urobilinogen, UA: 0.2 (ref 0.0–1.0)
pH: 7 (ref 5.0–8.0)

## 2013-10-10 LAB — HEPATIC FUNCTION PANEL
ALT: 20 U/L (ref 0–53)
Albumin: 4.1 g/dL (ref 3.5–5.2)
Alkaline Phosphatase: 59 U/L (ref 39–117)
Bilirubin, Direct: 0.2 mg/dL (ref 0.0–0.3)
Total Protein: 7.1 g/dL (ref 6.0–8.3)

## 2013-10-10 LAB — LIPID PANEL
HDL: 52.2 mg/dL (ref >39.00)
Total CHOL/HDL Ratio: 4
VLDL: 17.6 mg/dL (ref 0.0–40.0)

## 2013-10-10 LAB — MICROALBUMIN / CREATININE URINE RATIO
Creatinine,U: 121.5 mg/dL
Microalb Creat Ratio: 1.1 mg/g (ref 0.0–30.0)
Microalb, Ur: 1.3 mg/dL (ref 0.0–1.9)

## 2013-10-10 LAB — BASIC METABOLIC PANEL
BUN: 13 mg/dL (ref 6–23)
Calcium: 9 mg/dL (ref 8.4–10.5)
Chloride: 103 mEq/L (ref 96–112)
Creatinine, Ser: 1.2 mg/dL (ref 0.4–1.5)
Sodium: 141 mEq/L (ref 135–145)

## 2013-10-10 LAB — HEMOGLOBIN A1C: Hgb A1c MFr Bld: 7.1 % — ABNORMAL HIGH (ref 4.6–6.5)

## 2013-10-10 LAB — URIC ACID: Uric Acid, Serum: 5.2 mg/dL (ref 4.0–7.8)

## 2013-10-10 MED ORDER — FLUTICASONE PROPIONATE 50 MCG/ACT NA SUSP: 2.0000 | Freq: Every day | NASAL | Status: DC | PRN

## 2013-10-10 NOTE — Progress Notes (Signed)
Subjective:    Patient ID: Spencer English, male    DOB: Feb 01, 1966, 47 y.o.   MRN: [id number]  HPI The state of at least three ongoing medical problems is addressed today, with interval history of each noted here: Hyperthyroidism: was dx'ed in the hospital.  He is overdue for f/u, but he says he takes tapazole as rx'ed.  Tremor is resolved. AF: he denies palpitations.  He did not f/u with cardiol.   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  Denies sob.  He did not go for pulm appt.  Renal failure: worsened in the hospital. Denies dysuria.   DM: he does not check cbg's. 4 days ago, pt fell at home, and struck right temporal area.  He had moderate pain there, and assoc lump.  The pain is resolved. Past Medical History  Diagnosis Date  . DM 08/08/2008  . DYSLIPIDEMIA 04/26/2009  . GOUT 10/24/2010  . HYPERTENSION 07/21/2007  . PULMONARY EMBOLISM 05/03/2009  . DEEP VENOUS THROMBOPHLEBITIS, LEG, LEFT 05/05/2009  . ALLERGIC RHINITIS 04/12/2009  . UNSPECIFIED URINARY CALCULUS 07/04/2010  . 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 ECZEMA 05/23/2010  . Long term (current) use of anticoagulants 04/03/2011  . BACK PAIN, LUMBAR 01/14/2011  . 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 Hyperglycemia   . Leukopenia   . Atrial fibrillation   . GERD (gastroesophageal reflux disease)     Past Surgical History  Procedure Laterality Date  . Anterior cruciate ligament repair  2000    Right  . Knee arthroscopy      left knee  . Knee arthroscopy w/ acl reconstruction      right knee  . Cystectomy      back of head  . Knee arthroscopy  01/03/2013    Procedure: ARTHROSCOPY KNEE;  Surgeon: Thera Flake., MD;  Location: [facility] OR;  Service: Orthopedics;  Laterality: Left;  Lavage Synovectomy, Removal of loose body    History   Social History  . Marital Status: Legally Separated    Spouse Name: N/A    Number of Children: 3  . 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     Social History Main  Topics  . Smoking status: Former Smoker    Quit date: 01/07/2005  . Smokeless tobacco: Never Used  . Alcohol Use: Yes     Comment: Occ.  . Drug Use: No  . Sexual Activity:    Other Topics Concern  . Not on file   Social History Narrative  . No narrative on file    Current Outpatient Prescriptions on File Prior to Visit  Medication Sig Dispense Refill  . acetaminophen (TYLENOL) 325 MG tablet Take 2 tablets (650 mg total) by mouth every 6 (six) hours as needed for pain.      Marland Kitchen allopurinol (ZYLOPRIM) 300 MG tablet Take 1 tablet (300 mg total) by mouth as needed. For gout flare-up.  30 tablet  11  . fexofenadine (ALLEGRA) 180 MG tablet Take 180 mg by mouth daily as needed. For allergies.      . furosemide (LASIX) 20 MG tablet Take 1 tablet (20 mg total) by mouth daily.  30 tablet  11  . halobetasol (ULTRAVATE) 0.05 % cream APPLY CREAM TOPICALLY THREE TIMES DAILY AS NEEDED FOR RASH.  50 g  0  . JANUVIA 100 MG tablet TAKE ONE TABLET BY MOUTH EVERY DAY. PATIENT  NEEDS  TO  SCHEDULE  FOLLOW  UP  APPOINTMENT  30 tablet  0  . metoprolol tartrate (LOPRESSOR) 25 MG tablet Take 1 tablet (25 mg total) by mouth 2 (two) times daily.  60 tablet  2  . pantoprazole (PROTONIX) 40 MG tablet Take 1 tablet (40 mg total) by mouth daily.  30 tablet  0  . verapamil (COVERA HS) 240 MG (CO) 24 hr tablet Take 240 mg by mouth daily.       Marland Kitchen warfarin (COUMADIN) 5 MG tablet Take as directed by Anticoagulation clinic.  50 tablet  1  . atorvastatin (LIPITOR) 20 MG tablet Take 1 tablet (20 mg total) by mouth daily.  90 tablet  3   No current facility-administered medications on file prior to visit.    Allergies  Allergen Reactions  . Metformin     REACTION: nausea/diarrhea    Family History  Problem Relation Age of Onset  . Heart disease Neg Hx     no premature CAD known  . Diabetes Mother   . Hypertension Mother   . Hyperlipidemia Mother   . Diabetes Father   . Hypertension Father   . Hyperlipidemia  Father     BP 134/80  Pulse 80  Ht 6' (1.829 m)  Wt 271 lb (122.925 kg)  BMI 36.75 kg/m2  SpO2 98%  Review of Systems He has gained weight.  Denies LOC and visual loss.    Objective:   Physical Exam VITAL SIGNS:  See vs page.   GENERAL: no distress.  Head: nontender. No ecchymosis.  No swelling Gait: normal and steady.  Lab Results  Component Value Date   WBC 6.4 10/10/2013   HGB 13.9 10/10/2013   HCT 42.9 10/10/2013   PLT 191.0 10/10/2013   GLUCOSE 92 10/10/2013   CHOL 213* 10/10/2013   TRIG 88.0 10/10/2013   HDL 52.20 10/10/2013   LDLDIRECT 147.2 10/10/2013   LDLCALC 108* 07/04/2010   ALT 20 10/10/2013   AST 21 10/10/2013   NA 141 10/10/2013   K 4.2 10/10/2013   CL 103 10/10/2013   CREATININE 1.2 10/10/2013   BUN 13 10/10/2013   CO2 29 10/10/2013   TSH 20.50* 10/10/2013   PSA 0.92 10/10/2013   INR 1.2 09/26/2013   HGBA1C 7.1* 10/10/2013   MICROALBUR 1.3 10/10/2013      Assessment & Plan:  Hyperthyroidism: overcontrolled Noncompliance with f/u: this causes very high risk to his health. DM: he needs increased rx, if it can be done with a regimen that avoids or minimizes hypoglycemia. Dyslipidemia: he needs increased rx. Renal failure: improved. Head contusion: no evidence of neurologic sequela AF: he needs f/u pulm emboli: clinically much better, although he did not go to pulmonary. Thrombocytopenia: much better

## 2013-10-10 NOTE — Patient Instructions (Signed)
blood tests are being requested for you today.  We'll contact you with results. check your blood sugar once a day.  vary the time of day when you check, between before the 3 meals, and at bedtime.  also check if you have symptoms of your blood sugar being too high or too low.  please keep a record of the readings and bring it to your next appointment here.  You can write it on any piece of paper.  please call us sooner if your blood sugar goes below 70, or if you have a lot of readings over 200. Please come in soon for a regular physical. Please schedule a follow-up appointment with dr Johney Frame. blood tests are being requested for you today.  We'll contact you with results.

## 2013-10-11 MED ORDER — CANAGLIFLOZIN 300 MG PO TABS: 1.0000 | ORAL_TABLET | Freq: Every day | ORAL | Status: DC

## 2013-10-12 ENCOUNTER — Ambulatory Visit: Payer: BC Managed Care – PPO | Admitting: Endocrinology

## 2013-10-27 ENCOUNTER — Ambulatory Visit (INDEPENDENT_AMBULATORY_CARE_PROVIDER_SITE_OTHER): Payer: BC Managed Care – PPO | Admitting: Endocrinology

## 2013-10-27 ENCOUNTER — Encounter: Payer: Self-pay | Admitting: Endocrinology

## 2013-10-27 VITALS — BP 142/88 | HR 76 | Temp 97.9°F | Resp 12 | Wt 273.0 lb

## 2013-10-27 DIAGNOSIS — I4891 Unspecified atrial fibrillation: Secondary | ICD-10-CM

## 2013-10-27 DIAGNOSIS — E0590 Thyrotoxicosis, unspecified without thyrotoxic crisis or storm: Secondary | ICD-10-CM

## 2013-10-27 LAB — T4, FREE: Free T4: 0.97 ng/dL (ref 0.80–1.80)

## 2013-10-27 LAB — TSH: TSH: 1.793 u[IU]/mL (ref 0.350–4.500)

## 2013-10-27 MED ORDER — CANAGLIFLOZIN 300 MG PO TABS: 1.0000 | ORAL_TABLET | Freq: Every day | ORAL | Status: DC

## 2013-10-27 NOTE — Patient Instructions (Addendum)
blood tests are being requested for you today.  We'll contact you with results. Based on the results, you will probably need to resume the methimazole at a lower amount.   check your blood sugar once a day.  vary the time of day when you check, between before the 3 meals, and at bedtime.  also check if you have symptoms of your blood sugar being too high or too low.  please keep a record of the readings and bring it to your next appointment here.  You can write it on any piece of paper.  please call us sooner if your blood sugar goes below 70, or if you have a lot of readings over 200. Please come in for a regular physical in about 1 month.   i have sent a prescription to your pharmacy, to add "invokana."

## 2013-10-27 NOTE — Progress Notes (Signed)
Subjective:    Patient ID: Spencer English, male    DOB: Jan 27, 1966, 47 y.o.   MRN: [id number]  HPI The state of at least three ongoing medical problems is addressed today, with interval history of each noted here: Hyperthyroidism: was dx'ed in the hospital for AF, in early 2014.  Tapazole was rx'ed, due to the need for prompt improvement.  He did not ret for f/u until October, 2014.  When he returned then, TSH was high.  Tapazole was stopped.  He denies tremor. AF: he denies palpitations.  He still did not f/u with cardiol.     DM (dx'ed 2007; he has mild if any neuropathy of the lower extremities, but he has associated renal failure; he has never been on insulin). he does not check cbg's. He did not start invokana. ED sxs have recurred. Pt was in the hospital in early 2014 with hypotension, but BP has been stable recently.   Past Medical History  Diagnosis Date  . DM 08/08/2008  . DYSLIPIDEMIA 04/26/2009  . GOUT 10/24/2010  . HYPERTENSION 07/21/2007  . PULMONARY EMBOLISM 05/03/2009  . DEEP VENOUS THROMBOPHLEBITIS, LEG, LEFT 05/05/2009  . ALLERGIC RHINITIS 04/12/2009  . UNSPECIFIED URINARY CALCULUS 07/04/2010  . 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 ECZEMA 05/23/2010  . Long term (current) use of anticoagulants 04/03/2011  . BACK PAIN, LUMBAR 01/14/2011  . 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 Hyperglycemia   . Leukopenia   . Atrial fibrillation   . GERD (gastroesophageal reflux disease)     Past Surgical History  Procedure Laterality Date  . Anterior cruciate ligament repair  2000    Right  . Knee arthroscopy      left knee  . Knee arthroscopy w/ acl reconstruction      right knee  . Cystectomy      back of head  . Knee arthroscopy  01/03/2013    Procedure: ARTHROSCOPY KNEE;  Surgeon: Thera Flake., MD;  Location: [facility] OR;  Service: Orthopedics;  Laterality: Left;  Lavage Synovectomy, Removal of loose body    History   Social History  . Marital Status: Legally Separated    Spouse Name: N/A     Number of Children: 3  . 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     Social History Main Topics  . Smoking status: Former Smoker    Quit date: 01/07/2005  . Smokeless tobacco: Never Used  . Alcohol Use: Yes     Comment: Occ.  . Drug Use: No  . Sexual Activity:    Other Topics Concern  . Not on file   Social History Narrative  . No narrative on file    Current Outpatient Prescriptions on File Prior to Visit  Medication Sig Dispense Refill  . acetaminophen (TYLENOL) 325 MG tablet Take 2 tablets (650 mg total) by mouth every 6 (six) hours as needed for pain.      Marland Kitchen allopurinol (ZYLOPRIM) 300 MG tablet Take 1 tablet (300 mg total) by mouth as needed. For gout flare-up.  30 tablet  11  . fexofenadine (ALLEGRA) 180 MG tablet Take 180 mg by mouth daily as needed. For allergies.      . fluticasone (FLONASE) 50 MCG/ACT nasal spray Place 2 sprays into the nose daily as needed. For allergies.  16 g  11  . furosemide (LASIX) 20 MG tablet Take 1 tablet (20 mg total) by mouth daily.  30 tablet  11  .  halobetasol (ULTRAVATE) 0.05 % cream APPLY CREAM TOPICALLY THREE TIMES DAILY AS NEEDED FOR RASH.  50 g  0  . JANUVIA 100 MG tablet TAKE ONE TABLET BY MOUTH EVERY DAY. PATIENT  NEEDS  TO  SCHEDULE  FOLLOW  UP  APPOINTMENT  30 tablet  0  . metoprolol tartrate (LOPRESSOR) 25 MG tablet Take 1 tablet (25 mg total) by mouth 2 (two) times daily.  60 tablet  2  . pantoprazole (PROTONIX) 40 MG tablet Take 1 tablet (40 mg total) by mouth daily.  30 tablet  0  . verapamil (COVERA HS) 240 MG (CO) 24 hr tablet Take 240 mg by mouth daily.       Marland Kitchen warfarin (COUMADIN) 5 MG tablet Take as directed by Anticoagulation clinic.  50 tablet  1  . atorvastatin (LIPITOR) 20 MG tablet Take 1 tablet (20 mg total) by mouth daily.  90 tablet  3   No current facility-administered medications on file prior to visit.    Allergies  Allergen Reactions  . Metformin     REACTION: nausea/diarrhea   . Viagra [Sildenafil Citrate]     headache    Family History  Problem Relation Age of Onset  . Heart disease Neg Hx     no premature CAD known  . Diabetes Mother   . Hypertension Mother   . Hyperlipidemia Mother   . Diabetes Father   . Hypertension Father   . Hyperlipidemia Father     BP 142/88  Pulse 76  Temp(Src) 97.9 F (36.6 C) (Oral)  Resp 12  Wt 273 lb (123.832 kg)  BMI 37.02 kg/m2  SpO2 97%  Review of Systems Denies fever and sob    Objective:   Physical Exam VITAL SIGNS:  See vs page GENERAL: no distress PSYCH: Alert and well-oriented.  Does not appear anxious nor depressed.     Assessment & Plan:  ED: he declines rx for now, despite stable BP Hyperthyroidism: he probably needs to resume tapazole at a lower dosage. DM: therapy limited by noncompliance.  i'll do the best i can.

## 2013-11-04 ENCOUNTER — Ambulatory Visit: Payer: BC Managed Care – PPO | Admitting: Internal Medicine

## 2013-11-22 ENCOUNTER — Other Ambulatory Visit: Payer: BC Managed Care – PPO

## 2013-11-29 ENCOUNTER — Encounter: Payer: BC Managed Care – PPO | Admitting: Endocrinology

## 2013-11-29 DIAGNOSIS — Z0289 Encounter for other administrative examinations: Secondary | ICD-10-CM

## 2013-12-06 ENCOUNTER — Other Ambulatory Visit: Payer: Self-pay | Admitting: Endocrinology

## 2013-12-06 ENCOUNTER — Other Ambulatory Visit: Payer: Self-pay | Admitting: Cardiology

## 2013-12-07 ENCOUNTER — Other Ambulatory Visit: Payer: Self-pay | Admitting: *Deleted

## 2013-12-07 MED ORDER — METHIMAZOLE 10 MG PO TABS: ORAL_TABLET | ORAL | Status: DC

## 2014-01-11 ENCOUNTER — Other Ambulatory Visit: Payer: Self-pay | Admitting: Cardiology

## 2014-01-23 ENCOUNTER — Ambulatory Visit (INDEPENDENT_AMBULATORY_CARE_PROVIDER_SITE_OTHER): Payer: BC Managed Care – PPO | Admitting: *Deleted

## 2014-01-23 DIAGNOSIS — Z7901 Long term (current) use of anticoagulants: Secondary | ICD-10-CM

## 2014-01-23 DIAGNOSIS — I2699 Other pulmonary embolism without acute cor pulmonale: Secondary | ICD-10-CM

## 2014-01-23 DIAGNOSIS — I82409 Acute embolism and thrombosis of unspecified deep veins of unspecified lower extremity: Secondary | ICD-10-CM

## 2014-01-23 LAB — POCT INR: INR: 1.2

## 2014-01-23 MED ORDER — WARFARIN SODIUM 5 MG PO TABS: ORAL_TABLET | ORAL | Status: DC

## 2014-02-02 ENCOUNTER — Ambulatory Visit: Payer: BC Managed Care – PPO | Admitting: Internal Medicine

## 2014-02-06 ENCOUNTER — Other Ambulatory Visit: Payer: Self-pay

## 2014-02-06 MED ORDER — ALLOPURINOL 300 MG PO TABS: 300.0000 mg | ORAL_TABLET | ORAL | Status: DC | PRN

## 2014-02-17 ENCOUNTER — Other Ambulatory Visit: Payer: Self-pay | Admitting: *Deleted

## 2014-02-17 MED ORDER — METOPROLOL TARTRATE 25 MG PO TABS: 25.0000 mg | ORAL_TABLET | Freq: Two times a day (BID) | ORAL | Status: DC

## 2014-03-06 ENCOUNTER — Ambulatory Visit: Payer: BC Managed Care – PPO | Admitting: Internal Medicine

## 2014-03-13 ENCOUNTER — Encounter: Payer: Self-pay | Admitting: Internal Medicine

## 2014-03-27 ENCOUNTER — Other Ambulatory Visit: Payer: Self-pay

## 2014-03-27 ENCOUNTER — Other Ambulatory Visit: Payer: Self-pay | Admitting: Endocrinology

## 2014-03-27 ENCOUNTER — Telehealth: Payer: Self-pay

## 2014-03-27 MED ORDER — ATORVASTATIN CALCIUM 20 MG PO TABS: 20.0000 mg | ORAL_TABLET | Freq: Every day | ORAL | Status: DC

## 2014-03-27 MED ORDER — VERAPAMIL HCL 240 MG (CO) PO TB24: 240.0000 mg | ORAL_TABLET | Freq: Every day | ORAL | Status: DC

## 2014-03-27 NOTE — Telephone Encounter (Signed)
Pt called requesting a refill on Verapamil. Medication has not been filled since 2013. Please advise, Thanks!

## 2014-03-27 NOTE — Telephone Encounter (Signed)
Please refill x 1 Ov is due  

## 2014-03-27 NOTE — Telephone Encounter (Signed)
Done

## 2014-04-11 ENCOUNTER — Other Ambulatory Visit: Payer: Self-pay | Admitting: Endocrinology

## 2014-04-18 ENCOUNTER — Telehealth: Payer: Self-pay | Admitting: Endocrinology

## 2014-04-18 ENCOUNTER — Other Ambulatory Visit: Payer: Self-pay | Admitting: *Deleted

## 2014-04-18 MED ORDER — METOPROLOL TARTRATE 25 MG PO TABS: 25.0000 mg | ORAL_TABLET | Freq: Two times a day (BID) | ORAL | Status: DC

## 2014-04-18 NOTE — Telephone Encounter (Signed)
Would like a card for [place].

## 2014-04-18 NOTE — Telephone Encounter (Signed)
Pt notified that we do not currently have any cards.

## 2014-04-27 ENCOUNTER — Telehealth: Payer: Self-pay | Admitting: Internal Medicine

## 2014-04-27 ENCOUNTER — Ambulatory Visit (INDEPENDENT_AMBULATORY_CARE_PROVIDER_SITE_OTHER): Payer: BC Managed Care – PPO

## 2014-04-27 DIAGNOSIS — Z7901 Long term (current) use of anticoagulants: Secondary | ICD-10-CM

## 2014-04-27 DIAGNOSIS — I2699 Other pulmonary embolism without acute cor pulmonale: Secondary | ICD-10-CM

## 2014-04-27 DIAGNOSIS — I82409 Acute embolism and thrombosis of unspecified deep veins of unspecified lower extremity: Secondary | ICD-10-CM

## 2014-04-27 LAB — POCT INR: INR: 1.6

## 2014-04-27 MED ORDER — WARFARIN SODIUM 5 MG PO TABS: ORAL_TABLET | ORAL | Status: DC

## 2014-05-05 ENCOUNTER — Ambulatory Visit: Payer: BC Managed Care – PPO | Admitting: Internal Medicine

## 2014-05-11 ENCOUNTER — Emergency Department (HOSPITAL_BASED_OUTPATIENT_CLINIC_OR_DEPARTMENT_OTHER)
Admission: EM | Admit: 2014-05-11 | Discharge: 2014-05-11 | Disposition: A | Discharge status: Home / Self Care | Payer: BC Managed Care – PPO | Attending: Emergency Medicine | Admitting: Emergency Medicine

## 2014-05-11 ENCOUNTER — Encounter (HOSPITAL_BASED_OUTPATIENT_CLINIC_OR_DEPARTMENT_OTHER): Payer: Self-pay | Admitting: Emergency Medicine

## 2014-05-11 DIAGNOSIS — Z86718 Personal history of other venous thrombosis and embolism: Secondary | ICD-10-CM | POA: Insufficient documentation

## 2014-05-11 DIAGNOSIS — Z87891 Personal history of nicotine dependence: Secondary | ICD-10-CM | POA: Insufficient documentation

## 2014-05-11 DIAGNOSIS — Z87442 Personal history of urinary calculi: Secondary | ICD-10-CM | POA: Insufficient documentation

## 2014-05-11 DIAGNOSIS — M109 Gout, unspecified: Secondary | ICD-10-CM | POA: Insufficient documentation

## 2014-05-11 DIAGNOSIS — K029 Dental caries, unspecified: Secondary | ICD-10-CM | POA: Insufficient documentation

## 2014-05-11 DIAGNOSIS — Z79899 Other long term (current) drug therapy: Secondary | ICD-10-CM | POA: Insufficient documentation

## 2014-05-11 DIAGNOSIS — K219 Gastro-esophageal reflux disease without esophagitis: Secondary | ICD-10-CM | POA: Insufficient documentation

## 2014-05-11 DIAGNOSIS — Z87448 Personal history of other diseases of urinary system: Secondary | ICD-10-CM | POA: Insufficient documentation

## 2014-05-11 DIAGNOSIS — Z7901 Long term (current) use of anticoagulants: Secondary | ICD-10-CM | POA: Insufficient documentation

## 2014-05-11 DIAGNOSIS — Z872 Personal history of diseases of the skin and subcutaneous tissue: Secondary | ICD-10-CM | POA: Insufficient documentation

## 2014-05-11 DIAGNOSIS — B029 Zoster without complications: Secondary | ICD-10-CM | POA: Insufficient documentation

## 2014-05-11 DIAGNOSIS — Z862 Personal history of diseases of the blood and blood-forming organs and certain disorders involving the immune mechanism: Secondary | ICD-10-CM | POA: Insufficient documentation

## 2014-05-11 DIAGNOSIS — Z86711 Personal history of pulmonary embolism: Secondary | ICD-10-CM | POA: Insufficient documentation

## 2014-05-11 DIAGNOSIS — I10 Essential (primary) hypertension: Secondary | ICD-10-CM | POA: Insufficient documentation

## 2014-05-11 DIAGNOSIS — E785 Hyperlipidemia, unspecified: Secondary | ICD-10-CM | POA: Insufficient documentation

## 2014-05-11 DIAGNOSIS — I4891 Unspecified atrial fibrillation: Secondary | ICD-10-CM | POA: Insufficient documentation

## 2014-05-11 DIAGNOSIS — E119 Type 2 diabetes mellitus without complications: Secondary | ICD-10-CM | POA: Insufficient documentation

## 2014-05-11 MED ORDER — HYDROCODONE-ACETAMINOPHEN 5-325 MG PO TABS: 1.0000 | ORAL_TABLET | ORAL | Status: DC | PRN

## 2014-05-11 MED ORDER — ACYCLOVIR 400 MG PO TABS: 800.0000 mg | ORAL_TABLET | Freq: Every day | ORAL | Status: DC

## 2014-05-11 MED ORDER — AMOXICILLIN 500 MG PO CAPS: 1000.0000 mg | ORAL_CAPSULE | Freq: Two times a day (BID) | ORAL | Status: DC

## 2014-05-11 NOTE — ED Notes (Signed)
Rash on left mid back from midline to left side.  Also broken tooth on left lower he thinks is infected. Has dentist appt next month but couldn't get in any sooner.

## 2014-05-11 NOTE — ED Provider Notes (Signed)
CSN: [id number]     Arrival date & time 05/11/14  1819 History   First MD Initiated Contact with Patient 05/11/14 1842     Chief Complaint  Patient presents with  . Rash     (Consider location/radiation/quality/duration/timing/severity/associated sxs/prior Treatment) Patient is a 48 y.o. male presenting with rash and tooth pain. The history is provided by the patient. No language interpreter was used.  Rash Location:  Torso Associated symptoms: no fever   Associated symptoms comment:  Rash that started as small area of multiple painful, and itching bumps and spread across left side of body from mid-back to lateral abdominal wall. No drainage. No fever.  Dental Pain Location:  Lower Lower teeth location:  29/RL 2nd bicuspid and 18/LL 2nd molar Context: dental caries and poor dentition   Associated symptoms: no fever     Past Medical History  Diagnosis Date  . DM 08/08/2008  . DYSLIPIDEMIA 04/26/2009  . GOUT 10/24/2010  . HYPERTENSION 07/21/2007  . PULMONARY EMBOLISM 05/03/2009  . DEEP VENOUS THROMBOPHLEBITIS, LEG, LEFT 05/05/2009  . ALLERGIC RHINITIS 04/12/2009  . UNSPECIFIED URINARY CALCULUS 07/04/2010  . 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 ECZEMA 05/23/2010  . Long term (current) use of anticoagulants 04/03/2011  . BACK PAIN, LUMBAR 01/14/2011  . 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 Hyperglycemia   . Leukopenia   . Atrial fibrillation   . GERD (gastroesophageal reflux disease)    Past Surgical History  Procedure Laterality Date  . Anterior cruciate ligament repair  2000    Right  . Knee arthroscopy      left knee  . Knee arthroscopy w/ acl reconstruction      right knee  . Cystectomy      back of head  . Knee arthroscopy  01/03/2013    Procedure: ARTHROSCOPY KNEE;  Surgeon: Yvette Rack., MD;  Location: [place];  Service: Orthopedics;  Laterality: Left;  Lavage Synovectomy, Removal of loose body   Family History  Problem Relation Age of Onset  . Heart disease Neg Hx     no  premature CAD known  . Diabetes Mother   . Hypertension Mother   . Hyperlipidemia Mother   . Diabetes Father   . Hypertension Father   . Hyperlipidemia Father    History  Substance Use Topics  . Smoking status: Former Smoker    Quit date: 01/07/2005  . Smokeless tobacco: Never Used  . Alcohol Use: Yes     Comment: Occ.    Review of Systems  Constitutional: Negative for fever and chills.  Musculoskeletal: Negative.   Skin: Positive for rash.       See HPI.  Neurological: Negative.       Allergies  Metformin and Viagra  Home Medications   Prior to Admission medications   Medication Sig Start Date End Date Taking? Authorizing Provider  acetaminophen (TYLENOL) 325 MG tablet Take 2 tablets (650 mg total) by mouth every 6 (six) hours as needed for pain. 01/27/13   Sorin June Leap, MD  allopurinol (ZYLOPRIM) 300 MG tablet Take 1 tablet (300 mg total) by mouth as needed. For gout flare-up. 02/06/14   Renato Shin, MD  atorvastatin (LIPITOR) 20 MG tablet Take 1 tablet (20 mg total) by mouth daily. 03/27/14 03/27/15  Renato Shin, MD  Canagliflozin (INVOKANA) 300 MG TABS Take 1 tablet (300 mg total) by mouth daily. 10/27/13   Renato Shin, MD  fexofenadine (ALLEGRA) 180 MG tablet Take 180 mg by mouth daily as needed. For  allergies.    Historical Provider, MD  fluticasone (FLONASE) 50 MCG/ACT nasal spray Place 2 sprays into the nose daily as needed. For allergies. 10/10/13   Renato Shin, MD  furosemide (LASIX) 20 MG tablet TAKE ONE TABLET BY MOUTH ONCE DAILY 04/11/14   Renato Shin, MD  halobetasol (ULTRAVATE) 0.05 % cream APPLY CREAM TOPICALLY THREE TIMES DAILY AS NEEDED FOR RASH. 06/29/13   Renato Shin, MD  JANUVIA 100 MG tablet TAKE ONE TABLET BY MOUTH EVERY DAY. PATIENT  NEEDS  TO  SCHEDULE  FOLLOW  UP  APPOINTMENT 10/03/13   Renato Shin, MD  methimazole (TAPAZOLE) 10 MG tablet TAKE 2 TABLETS BY MOUTH 2 TIMES DAILY. 12/07/13   Renato Shin, MD  metoprolol tartrate (LOPRESSOR) 25 MG tablet  Take 1 tablet (25 mg total) by mouth 2 (two) times daily. 04/18/14   Peter M Martinique, MD  pantoprazole (PROTONIX) 40 MG tablet Take 1 tablet (40 mg total) by mouth daily. 01/27/13   Sorin June Leap, MD  verapamil (COVERA HS) 240 MG (CO) 24 hr tablet Take 1 tablet (240 mg total) by mouth daily. 03/27/14   Renato Shin, MD  warfarin (COUMADIN) 5 MG tablet Take as directed by Anticoagulation clinic. 04/27/14   Renato Shin, MD   BP 136/97  Pulse 100  Temp(Src) 97.9 F (36.6 C) (Oral)  Resp 16  Ht 5\' 11"  (1.803 m)  Wt 270 lb (122.471 kg)  BMI 37.67 kg/m2  SpO2 99% Physical Exam  Constitutional: He appears well-developed and well-nourished. No distress.  HENT:  Partially edentulous. Severe decay of #'s 18 and 29.   Lymphadenopathy:    He has no cervical adenopathy.  Skin:  Raised red rash with various stages of vesicular lesions left mid-back to lateral abdominal wall c/w shingles.     ED Course  Procedures (including critical care time) Labs Review Labs Reviewed - No data to display  Imaging Review No results found.   EKG Interpretation None      MDM   Final diagnoses:  None    1. Shingles  Acyclovir and pain management provided for relief of shingles. Referred to PCP. Amoxil prescribed for dental decay, pain and possible infection.     Dewaine Oats, PA-C 05/11/14 1925

## 2014-05-11 NOTE — ED Provider Notes (Signed)
Medical screening examination/treatment/procedure(s) were performed by non-physician practitioner and as supervising physician I was immediately available for consultation/collaboration.   EKG Interpretation None        Keyuna Cuthrell B. Karle Starch, MD 05/11/14 2048

## 2014-05-11 NOTE — Discharge Instructions (Signed)
Dental Caries  Dental caries (also called tooth decay) is the most common oral disease. It can occur at any age, but is more common in children and young adults.  HOW DENTAL CARIES DEVELOPS  The process of decay begins when bacteria and foods (particularly sugars and starches) combine in your mouth to produce plaque. Plaque is a substance that sticks to the hard, outer surface of a tooth (enamel). The bacteria in plaque produce acids that attack enamel. These acids may also attack the root surface of a tooth (cementum) if it is exposed. Repeated attacks dissolve these surfaces and create holes in the tooth (cavities). If left untreated, the acids destroy the other layers of the tooth.  RISK FACTORS  Frequent sipping of sugary beverages.   Frequent snacking on sugary and starchy foods, especially those that easily get stuck in the teeth.   Poor oral hygiene.   Dry mouth.   Substance abuse such as methamphetamine abuse.   Broken or poor-fitting dental restorations.   Eating disorders.   Gastroesophageal reflux disease (GERD).   Certain radiation treatments to the head and neck. SYMPTOMS In the early stages of dental caries, symptoms are seldom present. Sometimes white, chalky areas may be seen on the enamel or other tooth layers. In later stages, symptoms may include:  Pits and holes on the enamel.  Toothache after sweet, hot, or cold foods or drinks are consumed.  Pain around the tooth.  Swelling around the tooth. DIAGNOSIS  Most of the time, dental caries is detected during a regular dental checkup. A diagnosis is made after a thorough medical and dental history is taken and the surfaces of your teeth are checked for signs of dental caries. Sometimes special instruments, such as lasers, are used to check for dental caries. Dental X-ray exams may be taken so that areas not visible to the eye (such as between the contact areas of the teeth) can be checked for cavities.    TREATMENT  If dental caries is in its early stages, it may be reversed with a fluoride treatment or an application of a remineralizing agent at the dental office. Thorough brushing and flossing at home is needed to aid these treatments. If it is in its later stages, treatment depends on the location and extent of tooth destruction:   If a small area of the tooth has been destroyed, the destroyed area will be removed and cavities will be filled with a material such as gold, silver amalgam, or composite resin.   If a large area of the tooth has been destroyed, the destroyed area will be removed and a cap (crown) will be fitted over the remaining tooth structure.   If the center part of the tooth (pulp) is affected, a procedure called a root canal will be needed before a filling or crown can be placed.   If most of the tooth has been destroyed, the tooth may need to be pulled (extracted). HOME CARE INSTRUCTIONS You can prevent, stop, or reverse dental caries at home by practicing good oral hygiene. Good oral hygiene includes:  Thoroughly cleaning your teeth at least twice a day with a toothbrush and dental floss.   Using a fluoride toothpaste. A fluoride mouth rinse may also be used if recommended by your dentist or health care provider.   Restricting the amount of sugary and starchy foods and sugary liquids you consume.   Avoiding frequent snacking on these foods and sipping of these liquids.   Keeping regular visits  with a dentist for checkups and cleanings. PREVENTION   Practice good oral hygiene.  Consider a dental sealant. A dental sealant is a coating material that is applied by your dentist to the pits and grooves of teeth. The sealant prevents food from being trapped in them. It may protect the teeth for several years.  Ask about fluoride supplements if you live in a community without fluorinated water or with water that has a low fluoride content. Use fluoride supplements  as directed by your dentist or health care provider.  Allow fluoride varnish applications to teeth if directed by your dentist or health care provider. Document Released: 09/06/2002 Document Revised: 08/17/2013 Document Reviewed: 12/17/2012 [facility] Patient Information 2014 [place]. Shingles Shingles (herpes zoster) is an infection that is caused by the same virus that causes chickenpox (varicella). The infection causes a painful skin rash and fluid-filled blisters, which eventually break open, crust over, and heal. It may occur in any area of the body, but it usually affects only one side of the body or face. The pain of shingles usually lasts about 1 month. However, some people with shingles may develop long-term (chronic) pain in the affected area of the body. Shingles often occurs many years after the person had chickenpox. It is more common:  In people older than 50 years.  In people with weakened immune systems, such as those with HIV, AIDS, or cancer.  In people taking medicines that weaken the immune system, such as transplant medicines.  In people under great stress. CAUSES  Shingles is caused by the varicella zoster virus (VZV), which also causes chickenpox. After a person is infected with the virus, it can remain in the person's body for years in an inactive state (dormant). To cause shingles, the virus reactivates and breaks out as an infection in a nerve root. The virus can be spread from person to person (contagious) through contact with open blisters of the shingles rash. It will only spread to people who have not had chickenpox. When these people are exposed to the virus, they may develop chickenpox. They will not develop shingles. Once the blisters scab over, the person is no longer contagious and cannot spread the virus to others. SYMPTOMS  Shingles shows up in stages. The initial symptoms may be pain, itching, and tingling in an area of the skin. This pain is usually  described as burning, stabbing, or throbbing.In a few days or weeks, a painful red rash will appear in the area where the pain, itching, and tingling were felt. The rash is usually on one side of the body in a band or belt-like pattern. Then, the rash usually turns into fluid-filled blisters. They will scab over and dry up in approximately 2 3 weeks. Flu-like symptoms may also occur with the initial symptoms, the rash, or the blisters. These may include:  Fever.  Chills.  Headache.  Upset stomach. DIAGNOSIS  Your caregiver will perform a skin exam to diagnose shingles. Skin scrapings or fluid samples may also be taken from the blisters. This sample will be examined under a microscope or sent to a lab for further testing. TREATMENT  There is no specific cure for shingles. Your caregiver will likely prescribe medicines to help you manage the pain, recover faster, and avoid long-term problems. This may include antiviral drugs, anti-inflammatory drugs, and pain medicines. HOME CARE INSTRUCTIONS   Take a cool bath or apply cool compresses to the area of the rash or blisters as directed. This may help  with the pain and itching.   Only take over-the-counter or prescription medicines as directed by your caregiver.   Rest as directed by your caregiver.  Keep your rash and blisters clean with mild soap and cool water or as directed by your caregiver.  Do not pick your blisters or scratch your rash. Apply an anti-itch cream or numbing creams to the affected area as directed by your caregiver.  Keep your shingles rash covered with a loose bandage (dressing).  Avoid skin contact with:  Babies.   Pregnant women.   Children with eczema.   Elderly people with transplants.   People with chronic illnesses, such as leukemia or AIDS.   Wear loose-fitting clothing to help ease the pain of material rubbing against the rash.  Keep all follow-up appointments with your caregiver.If the  area involved is on your face, you may receive a referral for follow-up to a specialist, such as an eye doctor (ophthalmologist) or an ear, nose, and throat (ENT) doctor. Keeping all follow-up appointments will help you avoid eye complications, chronic pain, or disability.  SEEK IMMEDIATE MEDICAL CARE IF:   You have facial pain, pain around the eye area, or loss of feeling on one side of your face.  You have ear pain or ringing in your ear.  You have loss of taste.  Your pain is not relieved with prescribed medicines.   Your redness or swelling spreads.   You have more pain and swelling.  Your condition is worsening or has changed.   You have a feveror persistent symptoms for more than 2 3 days.  You have a fever and your symptoms suddenly get worse. MAKE SURE YOU:  Understand these instructions.  Will watch your condition.  Will get help right away if you are not doing well or get worse. Document Released: 12/15/2005 Document Revised: 09/08/2012 Document Reviewed: 07/29/2012 [facility] Patient Information 2014 [place].

## 2014-06-06 ENCOUNTER — Ambulatory Visit (INDEPENDENT_AMBULATORY_CARE_PROVIDER_SITE_OTHER): Payer: BC Managed Care – PPO | Admitting: General Practice

## 2014-06-06 ENCOUNTER — Other Ambulatory Visit: Payer: Self-pay | Admitting: General Practice

## 2014-06-06 DIAGNOSIS — Z7901 Long term (current) use of anticoagulants: Secondary | ICD-10-CM

## 2014-06-06 DIAGNOSIS — I2699 Other pulmonary embolism without acute cor pulmonale: Secondary | ICD-10-CM

## 2014-06-06 DIAGNOSIS — I82409 Acute embolism and thrombosis of unspecified deep veins of unspecified lower extremity: Secondary | ICD-10-CM

## 2014-06-06 LAB — POCT INR: INR: 2.8

## 2014-06-06 MED ORDER — WARFARIN SODIUM 5 MG PO TABS: ORAL_TABLET | ORAL | Status: DC

## 2014-06-06 NOTE — Progress Notes (Signed)
Pre visit review using our clinic review tool, if applicable. No additional management support is needed unless otherwise documented below in the visit note. 

## 2014-06-16 ENCOUNTER — Other Ambulatory Visit: Payer: Self-pay | Admitting: *Deleted

## 2014-06-16 MED ORDER — METOPROLOL TARTRATE 25 MG PO TABS: 25.0000 mg | ORAL_TABLET | Freq: Two times a day (BID) | ORAL | Status: DC

## 2014-06-20 ENCOUNTER — Telehealth: Payer: Self-pay | Admitting: Endocrinology

## 2014-06-20 MED ORDER — FUROSEMIDE 20 MG PO TABS: ORAL_TABLET | ORAL | Status: DC

## 2014-06-20 MED ORDER — VERAPAMIL HCL 240 MG (CO) PO TB24: 240.0000 mg | ORAL_TABLET | Freq: Every day | ORAL | Status: DC

## 2014-06-20 NOTE — Telephone Encounter (Signed)
See below. Pt has not been seen since October of 2014.

## 2014-06-20 NOTE — Telephone Encounter (Signed)
Rx refilled.

## 2014-06-20 NOTE — Telephone Encounter (Signed)
Pt is Dr. Cordelia Pen pt and needs a refill on his furosemide also needs to have a sleep test by 07/01/14.  Let pt know he is out of office until next week and is completely booked until 07/03/14

## 2014-06-20 NOTE — Telephone Encounter (Signed)
Please refill furosemide x 1 Please ask if [facility] could see patient about the sleep study request.

## 2014-06-20 NOTE — Telephone Encounter (Signed)
Patient would like refill on his verapamil and furosemide  [facility] :)

## 2014-06-21 MED ORDER — FUROSEMIDE 20 MG PO TABS: ORAL_TABLET | ORAL | Status: DC

## 2014-06-21 NOTE — Telephone Encounter (Signed)
Called pt. Advised that rx for furosemide was sent. An opening on 06/26/2014 was available at 8:15pm and pt is coming to address sleep study test then.

## 2014-06-21 NOTE — Addendum Note (Signed)
Addended by: Moody Bruins E on: 06/21/2014 08:03 AM   Modules accepted: Orders

## 2014-06-21 NOTE — Telephone Encounter (Signed)
error 

## 2014-06-22 ENCOUNTER — Encounter: Payer: Self-pay | Admitting: Endocrinology

## 2014-06-26 ENCOUNTER — Ambulatory Visit (INDEPENDENT_AMBULATORY_CARE_PROVIDER_SITE_OTHER): Payer: BC Managed Care – PPO | Admitting: Internal Medicine

## 2014-06-26 ENCOUNTER — Encounter: Payer: Self-pay | Admitting: Internal Medicine

## 2014-06-26 VITALS — BP 133/89 | HR 63 | Ht 71.0 in | Wt 283.0 lb

## 2014-06-26 DIAGNOSIS — E0590 Thyrotoxicosis, unspecified without thyrotoxic crisis or storm: Secondary | ICD-10-CM

## 2014-06-26 DIAGNOSIS — I480 Paroxysmal atrial fibrillation: Secondary | ICD-10-CM

## 2014-06-26 DIAGNOSIS — I4891 Unspecified atrial fibrillation: Secondary | ICD-10-CM

## 2014-06-26 DIAGNOSIS — I059 Rheumatic mitral valve disease, unspecified: Secondary | ICD-10-CM

## 2014-06-26 DIAGNOSIS — I340 Nonrheumatic mitral (valve) insufficiency: Secondary | ICD-10-CM

## 2014-06-26 MED ORDER — METOPROLOL TARTRATE 25 MG PO TABS: 12.5000 mg | ORAL_TABLET | Freq: Two times a day (BID) | ORAL | Status: DC

## 2014-06-26 NOTE — Progress Notes (Signed)
Primary Care Physician: Renato Shin, MD Referring Physician:  Dr Martinique  Spencer English is a 48 y.o. male with a h/o paroxysmal atrial fibrillation and atrial flutter in the setting of hyperthyroidism who presents for EP follow-up. His hyperthyroidism has been treated under the direction of Dr Loanne Drilling and feels that his afib has pretty much resolved.  He denies any recent symptoms of palpitations.  He has erectile dysfunction which he attributes to metoprolol.  He has some fatigue and also snores.  He is planning to have a sleep study arranged by Dr Loanne Drilling. Today, he denies symptoms of chest pain, shortness of breath, orthopnea, PND, lower extremity edema, dizziness, presyncope, syncope, or neurologic sequela. The patient is tolerating medications without difficulties and is otherwise without complaint today.   Past Medical History  Diagnosis Date  . DM 08/08/2008  . DYSLIPIDEMIA 04/26/2009  . GOUT 10/24/2010  . HYPERTENSION 07/21/2007  . PULMONARY EMBOLISM 05/03/2009  . DEEP VENOUS THROMBOPHLEBITIS, LEG, LEFT 05/05/2009  . ALLERGIC RHINITIS 04/12/2009  . UNSPECIFIED URINARY CALCULUS 07/04/2010  . 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 ECZEMA 05/23/2010  . Long term (current) use of anticoagulants 04/03/2011  . BACK PAIN, LUMBAR 01/14/2011  . 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 Hyperglycemia   . Leukopenia   . Atrial fibrillation   . GERD (gastroesophageal reflux disease)    Past Surgical History  Procedure Laterality Date  . Anterior cruciate ligament repair  2000    Right  . Knee arthroscopy      left knee  . Knee arthroscopy w/ acl reconstruction      right knee  . Cystectomy      back of head  . Knee arthroscopy  01/03/2013    Procedure: ARTHROSCOPY KNEE;  Surgeon: Yvette Rack., MD;  Location: [facility];  Service: Orthopedics;  Laterality: Left;  Lavage Synovectomy, Removal of loose body    Current Outpatient Prescriptions  Medication Sig Dispense Refill  . acetaminophen (TYLENOL) 325 MG  tablet Take 2 tablets (650 mg total) by mouth every 6 (six) hours as needed for pain.      Marland Kitchen allopurinol (ZYLOPRIM) 300 MG tablet Take 300 mg by mouth at bedtime. For gout flare-up.      Marland Kitchen atorvastatin (LIPITOR) 20 MG tablet Take 1 tablet (20 mg total) by mouth daily.  30 tablet  1  . fexofenadine (ALLEGRA) 180 MG tablet Take 180 mg by mouth daily as needed. For allergies.      . fluticasone (FLONASE) 50 MCG/ACT nasal spray Place 2 sprays into the nose daily as needed. For allergies.  16 g  11  . furosemide (LASIX) 20 MG tablet TAKE ONE TABLET BY MOUTH ONCE DAILY  30 tablet  1  . halobetasol (ULTRAVATE) 0.05 % cream APPLY CREAM TOPICALLY THREE TIMES DAILY AS NEEDED FOR RASH.  50 g  0  . JANUVIA 100 MG tablet TAKE ONE TABLET BY MOUTH EVERY DAY. PATIENT  NEEDS  TO  SCHEDULE  FOLLOW  UP  APPOINTMENT  30 tablet  0  . methimazole (TAPAZOLE) 10 MG tablet TAKE 2 TABLETS BY MOUTH 2 TIMES DAILY.  120 tablet  1  . metoprolol tartrate (LOPRESSOR) 25 MG tablet Take 0.5 tablets (12.5 mg total) by mouth 2 (two) times daily.  90 tablet  3  . verapamil (COVERA HS) 240 MG (CO) 24 hr tablet Take 1 tablet (240 mg total) by mouth daily.  30 tablet  1  . warfarin (COUMADIN) 5 MG tablet Take as directed  by Anticoagulation clinic.  165 tablet  0  . KERYDIN 5 % SOLN Pt has not started taking yet (06/26/14)      . terbinafine (LAMISIL) 250 MG tablet Pt has not started taking yet (06/26/14)       No current facility-administered medications for this visit.    Allergies  Allergen Reactions  . Metformin     REACTION: nausea/diarrhea  . Viagra [Sildenafil Citrate]     headache    History   Social History  . Marital Status: Legally Separated    Spouse Name: N/A    Number of Children: 3  . Years of Education: N/A   Occupational History  . Truck Geophysicist/field seismologist   .     Social History Main Topics  . Smoking status: Former Smoker    Quit date: 01/07/2005  . Smokeless tobacco: Never Used  . Alcohol Use: Yes      Comment: Occ.  . Drug Use: No  . Sexual Activity:    Other Topics Concern  . Not on file   Social History Narrative  . No narrative on file    Family History  Problem Relation Age of Onset  . Heart disease Neg Hx     no premature CAD known  . Diabetes Mother   . Hypertension Mother   . Hyperlipidemia Mother   . Diabetes Father   . Hypertension Father   . Hyperlipidemia Father    ROS- All systems are reviewed and negative except as per the HPI above  Physical Exam: Filed Vitals:   06/26/14 1140  BP: 133/89  Pulse: 63  Height: 5\' 11"  (1.803 m)  Weight: 283 lb (128.368 kg)    GEN- The patient is well appearing, alert and oriented x 3 today.   Head- normocephalic, atraumatic Eyes-  Sclera clear, conjunctiva pink Ears- hearing intact Oropharynx- clear Neck- supple,   Lungs- Clear to ausculation bilaterally, normal work of breathing Heart- Regular rate and rhythm, no murmurs, rubs or gallops, PMI not laterally displaced GI- soft, NT, ND, + BS Extremities- no clubbing, cyanosis, or edema MS- no significant deformity or atrophy Skin- no rash or lesion Psych- euthymic mood, full affect Neuro- strength and sensation are intact  EKG today reveals sinus rhythm 63 bpm, LVH, nonspecific St/ T changes Epic records including Dr Rosario Adie notes are reviewed  Assessment and Plan:  1. Afib/ atrial flutter Improved with treatment of hyperthyroidism The patient feels that his symptoms have actually resolved He requests that we stop his metoprolol due to symptoms of erectile dysfunction I will therefore reduce metoprolol to 12.5mg  BID today.  If he tolerates this then we will stop this medicine upon return  2. Moderate MR by echo 2014 Repeat echo He does not have a prominent murmur today Hopefully his structural heart changes have improved with treatment of his thyroid disease and improvement in atrial arrhythmias  3. Pulmonary hypertension He has prior pte's as the  cause Repeat echo as above Today, I discussed xarelto as an alternative to coumadin.  At this time he would prefer to continue coumadin but will further contemplate xarelto as an option and discuss this further with our anticoagulation clinic.  Return to see me in 3 months Follow-up with Dr Loanne Drilling as scheduled  The importance of compliance with follow-up was stressed today

## 2014-06-26 NOTE — Patient Instructions (Addendum)
Your physician has requested that you have an echocardiogram. Echocardiography is a painless test that uses sound waves to create images of your heart. It provides your doctor with information about the size and shape of your heart and how well your heart's chambers and valves are working. This procedure takes approximately one hour. There are no restrictions for this procedure.  Your physician wants you to follow-up in: 3 months with Dr. Rayann Heman.   Your physician has recommended you make the following change in your medication:  1) Decrease your Metoprolol to 12.5 mg twice a day

## 2014-06-27 ENCOUNTER — Ambulatory Visit (INDEPENDENT_AMBULATORY_CARE_PROVIDER_SITE_OTHER): Payer: BC Managed Care – PPO | Admitting: Endocrinology

## 2014-06-27 ENCOUNTER — Encounter: Payer: Self-pay | Admitting: Endocrinology

## 2014-06-27 ENCOUNTER — Other Ambulatory Visit: Payer: Self-pay

## 2014-06-27 VITALS — BP 120/86 | HR 65 | Temp 98.2°F | Ht 71.0 in | Wt 282.0 lb

## 2014-06-27 DIAGNOSIS — E0590 Thyrotoxicosis, unspecified without thyrotoxic crisis or storm: Secondary | ICD-10-CM

## 2014-06-27 DIAGNOSIS — E119 Type 2 diabetes mellitus without complications: Secondary | ICD-10-CM

## 2014-06-27 DIAGNOSIS — G479 Sleep disorder, unspecified: Secondary | ICD-10-CM

## 2014-06-27 LAB — T4, FREE: FREE T4: 0.61 ng/dL (ref 0.60–1.60)

## 2014-06-27 LAB — TSH: TSH: 1.61 u[IU]/mL (ref 0.35–4.50)

## 2014-06-27 LAB — HEMOGLOBIN A1C: HEMOGLOBIN A1C: 7.1 % — AB (ref 4.6–6.5)

## 2014-06-27 MED ORDER — BROMOCRIPTINE MESYLATE 2.5 MG PO TABS: 1.2500 mg | ORAL_TABLET | Freq: Every day | ORAL | Status: DC

## 2014-06-27 MED ORDER — SITAGLIPTIN PHOSPHATE 100 MG PO TABS: ORAL_TABLET | ORAL | Status: DC

## 2014-06-27 NOTE — Progress Notes (Signed)
Subjective:    Patient ID: Spencer English, male    DOB: November 10, 1966, 48 y.o.   MRN: [id number]  HPI The state of at least three ongoing medical problems is addressed today, with interval history of each noted here: Hyperthyroidism: was dx'ed in the hospital for AF, in early 2014.  Tapazole was rx'ed, due to the need for prompt improvement.  He did not ret for f/u until October, 2014.  When he returned then, TSH was high.  Tapazole was stopped.  He denies tremor.  DM (dx'ed 2007; he has mild if any neuropathy of the lower extremities, but he has associated renal failure; he has never been on insulin). he does not check cbg's. He did not start invokana.   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  Examining dr says pt needs sleep study to be certified.  He says CDL will expire in 2 days unless I sign a form accepting responsibility for this.   Past Medical History  Diagnosis Date  . DM 08/08/2008  . DYSLIPIDEMIA 04/26/2009  . GOUT 10/24/2010  . HYPERTENSION 07/21/2007  . PULMONARY EMBOLISM 05/03/2009  . DEEP VENOUS THROMBOPHLEBITIS, LEG, LEFT 05/05/2009  . ALLERGIC RHINITIS 04/12/2009  . UNSPECIFIED URINARY CALCULUS 07/04/2010  . 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 ECZEMA 05/23/2010  . Long term (current) use of anticoagulants 04/03/2011  . BACK PAIN, LUMBAR 01/14/2011  . 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 Hyperglycemia   . Leukopenia   . Atrial fibrillation   . GERD (gastroesophageal reflux disease)     Past Surgical History  Procedure Laterality Date  . Anterior cruciate ligament repair  2000    Right  . Knee arthroscopy      left knee  . Knee arthroscopy w/ acl reconstruction      right knee  . Cystectomy      back of head  . Knee arthroscopy  01/03/2013    Procedure: ARTHROSCOPY KNEE;  Surgeon: Yvette Rack., MD;  Location: [facility];  Service: Orthopedics;  Laterality: Left;  Lavage Synovectomy, Removal of loose body    History   Social History  . Marital Status: Legally Separated    Spouse  Name: N/A    Number of Children: 3  . Years of Education: N/A   Occupational History  . Truck Geophysicist/field seismologist   .     Social History Main Topics  . Smoking status: Former Smoker    Quit date: 01/07/2005  . Smokeless tobacco: Never Used  . Alcohol Use: Yes     Comment: Occ.  . Drug Use: No  . Sexual Activity:    Other Topics Concern  . Not on file   Social History Narrative  . No narrative on file    Current Outpatient Prescriptions on File Prior to Visit  Medication Sig Dispense Refill  . acetaminophen (TYLENOL) 325 MG tablet Take 2 tablets (650 mg total) by mouth every 6 (six) hours as needed for pain.      Marland Kitchen allopurinol (ZYLOPRIM) 300 MG tablet Take 300 mg by mouth at bedtime. For gout flare-up.      Marland Kitchen atorvastatin (LIPITOR) 20 MG tablet Take 1 tablet (20 mg total) by mouth daily.  30 tablet  1  . fexofenadine (ALLEGRA) 180 MG tablet Take 180 mg by mouth daily as needed. For allergies.      . fluticasone (FLONASE) 50 MCG/ACT nasal spray Place 2 sprays into the nose daily as needed. For allergies.  16 g  11  .  furosemide (LASIX) 20 MG tablet TAKE ONE TABLET BY MOUTH ONCE DAILY  30 tablet  1  . halobetasol (ULTRAVATE) 0.05 % cream APPLY CREAM TOPICALLY THREE TIMES DAILY AS NEEDED FOR RASH.  50 g  0  . KERYDIN 5 % SOLN Pt has not started taking yet (06/26/14)      . methimazole (TAPAZOLE) 10 MG tablet TAKE 2 TABLETS BY MOUTH 2 TIMES DAILY.  120 tablet  1  . metoprolol tartrate (LOPRESSOR) 25 MG tablet Take 0.5 tablets (12.5 mg total) by mouth 2 (two) times daily.  90 tablet  3  . terbinafine (LAMISIL) 250 MG tablet Pt has not started taking yet (06/26/14)      . verapamil (COVERA HS) 240 MG (CO) 24 hr tablet Take 1 tablet (240 mg total) by mouth daily.  30 tablet  1  . warfarin (COUMADIN) 5 MG tablet Take as directed by Anticoagulation clinic.  165 tablet  0   No current facility-administered medications on file prior to visit.    Allergies  Allergen Reactions  . Metformin      REACTION: nausea/diarrhea  . Viagra [Sildenafil Citrate]     headache    Family History  Problem Relation Age of Onset  . Heart disease Neg Hx     no premature CAD known  . Diabetes Mother   . Hypertension Mother   . Hyperlipidemia Mother   . Diabetes Father   . Hypertension Father   . Hyperlipidemia Father     BP 120/86  Pulse 65  Temp(Src) 98.2 F (36.8 C) (Oral)  Ht '5\' 11"'  (1.803 m)  Wt 282 lb (127.914 kg)  BMI 39.35 kg/m2  SpO2 95%  Review of Systems Denies fever and weight change.      Objective:   Physical Exam Pulses: dorsalis pedis intact bilat.   Feet: no deformity. normal color and temp.  Trace bilat leg edema, bilateral onychomycosis, and varicosities.   Skin:  no ulcer on the feet.   Neuro: sensation is intact to touch on the feet.    Lab Results  Component Value Date   HGBA1C 7.1* 06/27/2014      Assessment & Plan:  DM: mild exacerbation Sleep disorder, new.  i don't have the expertise to say if it is safe for him to drive. pulm emboli: i am uncertain if he needs to continue coumadin.   Patient is advised the following: Patient Instructions  blood tests are being requested for you today.  We'll contact you with results. check your blood sugar once a day.  vary the time of day when you check, between before the 3 meals, and at bedtime.  also check if you have symptoms of your blood sugar being too high or too low.  please keep a record of the readings and bring it to your next appointment here.  You can write it on any piece of paper.  please call us sooner if your blood sugar goes below 70, or if you have a lot of readings over 200. Please come in for a regular physical in about 3 months.   i have sent a prescription to your pharmacy, to add "invokana."   Please see a sleep specialist.  you will receive a phone call, about a day and time for an appointment.

## 2014-06-27 NOTE — Patient Instructions (Addendum)
blood tests are being requested for you today.  We'll contact you with results. check your blood sugar once a day.  vary the time of day when you check, between before the 3 meals, and at bedtime.  also check if you have symptoms of your blood sugar being too high or too low.  please keep a record of the readings and bring it to your next appointment here.  You can write it on any piece of paper.  please call us sooner if your blood sugar goes below 70, or if you have a lot of readings over 200. Please come in for a regular physical in about 3 months.   i have sent a prescription to your pharmacy, to add "invokana."   Please see a sleep specialist.  you will receive a phone call, about a day and time for an appointment.

## 2014-06-28 ENCOUNTER — Ambulatory Visit (INDEPENDENT_AMBULATORY_CARE_PROVIDER_SITE_OTHER): Payer: BC Managed Care – PPO | Admitting: Pulmonary Disease

## 2014-06-28 ENCOUNTER — Encounter: Payer: Self-pay | Admitting: Pulmonary Disease

## 2014-06-28 VITALS — BP 124/70 | HR 81 | Temp 98.0°F | Ht 71.5 in | Wt 288.4 lb

## 2014-06-28 DIAGNOSIS — I2789 Other specified pulmonary heart diseases: Secondary | ICD-10-CM

## 2014-06-28 DIAGNOSIS — I272 Pulmonary hypertension, unspecified: Secondary | ICD-10-CM

## 2014-06-28 DIAGNOSIS — I2699 Other pulmonary embolism without acute cor pulmonale: Secondary | ICD-10-CM

## 2014-06-28 DIAGNOSIS — G479 Sleep disorder, unspecified: Secondary | ICD-10-CM

## 2014-06-28 NOTE — Assessment & Plan Note (Signed)
Stay on anticoagulation as long as isk factor persists - driving,sedentary If he changes jobs, can consider stopping

## 2014-06-28 NOTE — Assessment & Plan Note (Signed)
Rpt echo to assess

## 2014-06-28 NOTE — Patient Instructions (Addendum)
Home sleep study I will [facility] & ask them to give you an extension Stay on coumadin

## 2014-06-28 NOTE — Assessment & Plan Note (Addendum)
Proceed with PSG for DOT purposes He does not have any overt symptoms of hypersomnolence but does meet screening titrate criteria due to BMI and neck circumference. An overnight polysomnogram will be scheduled as a split study. The pathophysiology of obstructive sleep apnea , it's cardiovascular consequences & modes of treatment including CPAP were discused with the patient in detail & they evidenced understanding.  I spoke to his physician at [facility] and they will give him another 30 day extension on his license. Hopefully we can get his sleep study  Expedited by then.

## 2014-06-28 NOTE — Progress Notes (Signed)
Subjective:    Patient ID: Spencer English, male    DOB: [birth year], 48 y.o.   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 Examining dr says pt needs sleep study to be certified. He was given a 60 day extension. He says CDL will expire on July 4 th.  Epworth sleepiness score is 1. He denies excessive sleepiness while driving or with other social situations. There is no history of witnessed apneas. He reports mild snoring but is quit 2 at that his wife snores louder. He works long hours and will come home and nap occasionally around 7 PM for an hour or so and feels refreshed on waking up. Bedtime is around 11 PM, sleep latency is about 15 minutes, he sleeps on his back on his side with one pillow, reports 2-3 awakenings including bathroom visit, does not have a fixed wake up time due to his variable workhours, wakes up feeling refreshed, denies dryness of mouth or headaches. On weekends he will stay in bed till about 10 AM. He is gained 20 pounds over the last 2 years.  There is no history suggestive of cataplexy, sleep paralysis or parasomnias  He had a pulmonary embolism in 03/2009 and was placed on Coumadin. His risk factor is his sedentary life and long hours of sitting while driving-hence he was advised to stay on Coumadin as long as he did this kind of job. Followup CT in June in 9/13 showed resolved PE, also VQ scan in 12/2012 was low probability. He sees Dr. Rayann Heman for paroxysmal A. Fib. Echo in 12/2012 showed moderate mitral regurgitation, RVSP 43 which is attributed to residue from PE. Repeat echo is planned.  Past Medical History  Diagnosis Date  . DM 08/08/2008  . DYSLIPIDEMIA 04/26/2009  . GOUT 10/24/2010  . HYPERTENSION 07/21/2007  . PULMONARY EMBOLISM 05/03/2009  . DEEP VENOUS THROMBOPHLEBITIS, LEG, LEFT 05/05/2009  . ALLERGIC RHINITIS 04/12/2009    . UNSPECIFIED URINARY CALCULUS 07/04/2010  . 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 ECZEMA 05/23/2010  . Long term (current) use of anticoagulants 04/03/2011  . BACK PAIN, LUMBAR 01/14/2011  . 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 Hyperglycemia   . Leukopenia   . Atrial fibrillation   . GERD (gastroesophageal reflux disease)     Past Surgical History  Procedure Laterality Date  . Anterior cruciate ligament repair  2000    Right  . Knee arthroscopy      left knee  . Knee arthroscopy w/ acl reconstruction      right knee  . Cystectomy      back of head  . Knee arthroscopy  01/03/2013    Procedure: ARTHROSCOPY KNEE;  Surgeon: Yvette Rack., MD;  Location: [facility];  Service: Orthopedics;  Laterality: Left;  Lavage Synovectomy, Removal of loose body    Allergies  Allergen Reactions  . Metformin     REACTION: nausea/diarrhea  . Viagra [Sildenafil Citrate]     headache    History   Social History  . Marital Status: Legally Separated    Spouse Name: N/A    Number of Children: 3  . Years of Education: N/A   Occupational History  . Truck Geophysicist/field seismologist   .     Social History Main Topics  . Smoking status: Former Smoker    Types: Cigars    Quit date: 01/07/2005  .  Smokeless tobacco: Never Used  . Alcohol Use: No  . Drug Use: No  . Sexual Activity: Not on file   Other Topics Concern  . Not on file   Social History Narrative  . No narrative on file    Family History  Problem Relation Age of Onset  . Heart disease Neg Hx     no premature CAD known  . Diabetes Mother   . Hypertension Mother   . Hyperlipidemia Mother   . Diabetes Father   . Hypertension Father   . Hyperlipidemia Father       Review of Systems  Constitutional: Negative for fever and unexpected weight change.  HENT: Negative for congestion, dental problem, ear pain, nosebleeds, postnasal drip, rhinorrhea, sinus pressure, sneezing, sore throat and trouble swallowing.   Eyes: Negative for redness and itching.   Respiratory: Negative for cough, chest tightness, shortness of breath and wheezing.   Cardiovascular: Positive for palpitations. Negative for leg swelling.  Gastrointestinal: Negative for nausea and vomiting.  Genitourinary: Negative for dysuria.  Musculoskeletal: Negative for joint swelling.  Skin: Negative for rash.  Neurological: Positive for headaches.  Hematological: Does not bruise/bleed easily.  Psychiatric/Behavioral: Negative for dysphoric mood. The patient is not nervous/anxious.        Objective:   Physical Exam  Gen. Pleasant, obese, in no distress, normal affect ENT - no lesions, no post nasal drip, class 2 airway Neck: No JVD, no thyromegaly, no carotid bruits Lungs: no use of accessory muscles, no dullness to percussion, decreased without rales or rhonchi  Cardiovascular: Rhythm regular, heart sounds  normal, no murmurs or gallops, no peripheral edema Abdomen: soft and non-tender, no hepatosplenomegaly, BS normal. Musculoskeletal: No deformities, no cyanosis or clubbing Neuro:  alert, non focal, no tremors        Assessment & Plan:

## 2014-06-29 ENCOUNTER — Ambulatory Visit: Payer: BC Managed Care – PPO | Admitting: Endocrinology

## 2014-07-03 ENCOUNTER — Ambulatory Visit (HOSPITAL_BASED_OUTPATIENT_CLINIC_OR_DEPARTMENT_OTHER): Payer: BC Managed Care – PPO | Attending: Pulmonary Disease

## 2014-07-03 VITALS — Ht 72.0 in | Wt 277.0 lb

## 2014-07-03 DIAGNOSIS — Z86711 Personal history of pulmonary embolism: Secondary | ICD-10-CM | POA: Insufficient documentation

## 2014-07-03 DIAGNOSIS — I2789 Other specified pulmonary heart diseases: Secondary | ICD-10-CM | POA: Insufficient documentation

## 2014-07-03 DIAGNOSIS — G4733 Obstructive sleep apnea (adult) (pediatric): Secondary | ICD-10-CM | POA: Insufficient documentation

## 2014-07-03 DIAGNOSIS — Z79899 Other long term (current) drug therapy: Secondary | ICD-10-CM | POA: Insufficient documentation

## 2014-07-03 DIAGNOSIS — Z7901 Long term (current) use of anticoagulants: Secondary | ICD-10-CM | POA: Insufficient documentation

## 2014-07-03 DIAGNOSIS — G479 Sleep disorder, unspecified: Secondary | ICD-10-CM

## 2014-07-03 DIAGNOSIS — I4891 Unspecified atrial fibrillation: Secondary | ICD-10-CM | POA: Insufficient documentation

## 2014-07-03 DIAGNOSIS — Z9989 Dependence on other enabling machines and devices: Secondary | ICD-10-CM

## 2014-07-10 ENCOUNTER — Telehealth: Payer: Self-pay | Admitting: Pulmonary Disease

## 2014-07-10 DIAGNOSIS — G4733 Obstructive sleep apnea (adult) (pediatric): Secondary | ICD-10-CM

## 2014-07-10 NOTE — Telephone Encounter (Signed)
Pt had sleep study 07/03/14. Pt aware RA not currently in office. Pt reports his DOT card runs out the end of this month.  Please advise RA thanks

## 2014-07-11 NOTE — Telephone Encounter (Signed)
Will ask dr Halford Chessman to read

## 2014-07-11 NOTE — Telephone Encounter (Signed)
Would like an update.  Spencer English

## 2014-07-11 NOTE — Telephone Encounter (Signed)
LMOM x 1 

## 2014-07-11 NOTE — Telephone Encounter (Signed)
Pt aware and will forward back to Dr. Elsworth Soho Dr. Halford Chessman. Thanks

## 2014-07-12 NOTE — Telephone Encounter (Signed)
Called and spoke to pt. Informed pt that the sleep study was forwarded to Dr. Halford Chessman to read and we will be getting back to him once the results are available.

## 2014-07-13 DIAGNOSIS — G4733 Obstructive sleep apnea (adult) (pediatric): Secondary | ICD-10-CM

## 2014-07-13 NOTE — Telephone Encounter (Signed)
PSG 07/03/14 >> AHI 33.6, SaO2 low 76%.  CPAP 8 cm H2O >> AHI 0.8, +R, +S.  Will have Dr. Bari Mantis nurse inform pt that sleep study shows severe sleep apnea.  He did well with CPAP 8 cm H2O.  Will defer to Dr. Elsworth Soho to arrange for further therapy for his sleep apnea.

## 2014-07-13 NOTE — Telephone Encounter (Signed)
I spoke with patient about results and he verbalized understanding and had no questions Order sent.

## 2014-07-13 NOTE — Telephone Encounter (Signed)
Proceed with order to DME for CPAP 8 cm, mask of choice, humidity, download & FU in 4 weeks Pl let pt know

## 2014-07-13 NOTE — Telephone Encounter (Signed)
Dr. Halford Chessman, please advise if these are results are available to give to pt. Thanks.

## 2014-07-13 NOTE — Sleep Study (Signed)
[place]  NAME: Spencer English DATE OF BIRTH:  1966-08-28 MEDICAL RECORD NUMBER [id number]  LOCATION: [facility]  REFERRING PHYSICIAN: Kara Mead, M.D. READING PHYSICIAN: Chesley Mires, M.D. 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  He was evaluated by Dr. Elsworth Soho on 06/28/14 and advised he needed a sleep study to further assess.  He has a history of pulmonary embolism and atrial fibrillation associated with secondary pulmonary hypertension.  EPWORTH SLEEPINESS SCORE: 1. HEIGHT: 6' (182.9 cm)  WEIGHT: 277 lb (125.646 kg)    Body mass index is 37.56 kg/(m^2).  NECK SIZE: 18 in.  MEDICATIONS:  Current Outpatient Prescriptions on File Prior to Visit  Medication Sig Dispense Refill  . acetaminophen (TYLENOL) 325 MG tablet Take 2 tablets (650 mg total) by mouth every 6 (six) hours as needed for pain.      Marland Kitchen allopurinol (ZYLOPRIM) 300 MG tablet Take 300 mg by mouth at bedtime. For gout flare-up.      Marland Kitchen atorvastatin (LIPITOR) 20 MG tablet Take 1 tablet (20 mg total) by mouth daily.  30 tablet  1  . bromocriptine (PARLODEL) 2.5 MG tablet Take 0.5 tablets (1.25 mg total) by mouth at bedtime.  15 tablet  11  . fexofenadine (ALLEGRA) 180 MG tablet Take 180 mg by mouth daily as needed. For allergies.      . fluticasone (FLONASE) 50 MCG/ACT nasal spray Place 2 sprays into the nose daily as needed. For allergies.  16 g  11  . furosemide (LASIX) 20 MG tablet TAKE ONE TABLET BY MOUTH ONCE DAILY  30 tablet  1  . halobetasol (ULTRAVATE) 0.05 % cream APPLY CREAM TOPICALLY THREE TIMES DAILY AS NEEDED FOR RASH.  50 g  0  . KERYDIN 5 % SOLN Pt has not started taking yet (06/26/14)      . methimazole  (TAPAZOLE) 10 MG tablet TAKE 2 TABLETS BY MOUTH 2 TIMES DAILY.  120 tablet  1  . metoprolol tartrate (LOPRESSOR) 25 MG tablet Take 0.5 tablets (12.5 mg total) by mouth 2 (two) times daily.  90 tablet  3  . sitaGLIPtin (JANUVIA) 100 MG tablet TAKE ONE TABLET BY MOUTH EVERY DAY.  90 tablet  1  . terbinafine (LAMISIL) 250 MG tablet Pt has not started taking yet (06/26/14)      . verapamil (COVERA HS) 240 MG (CO) 24 hr tablet Take 1 tablet (240 mg total) by mouth daily.  30 tablet  1  . warfarin (COUMADIN) 5 MG tablet Take as directed by Anticoagulation clinic.  165 tablet  0   No current facility-administered medications on file prior to visit.    SLEEP ARCHITECTURE:  Diagnostic portion: Total recording time: 145.5 minutes.  Total sleep time was: 121.5 minutes.  Sleep efficiency: 83.5%.  Sleep latency: 14.5 minutes.  REM latency: 90.5 minutes.  Stage N1: 8.6%.  Stage N2: 76.1%.  Stage N3: 0%.  Stage R:  15.2%.  Supine sleep: 2.5 minutes.  Non-supine sleep: 119 minutes.  Titration portion: Total recording time: 249.5 minutes.  Total sleep time was: 219.5 minutes.  Sleep efficiency: 88%.  Sleep latency: 0 minutes.  REM latency: 82.5  minutes.  Stage N1: 4.8%.  Stage N2: 69.2%.  Stage N3: 0%.  Stage R:  26%.  Supine sleep: 77 minutes.  Non-supine sleep: 142.5 minutes.  CARDIAC DATA:  Average heart rate: 67 beats per minute. Rhythm strip: sinus rhythm with PAC's.  RESPIRATORY DATA: Diagnostic portion: Average respiratory rate: 19. Snoring: moderate. Average AHI: 33.6.   Apnea index: 7.4.  Hypopnea index: 26.2. Obstructive apnea index: 4.4.  Central apnea index: 3.  Mixed apnea index: 0. REM AHI: 58.4.  NREM AHI: 29.1. Supine AHI: 0. Non-supine AHI: 34.3.  Titration portion: He was started on CPAP 5 and increased to 10 cm H2O.  With CPAP at 8 cm H2O his AHI was reduced to 0.8.  At this pressure setting he was observed in REM and supine sleep.  MOVEMENT/PARASOMNIA:  Diagnostic  portion: Periodic limb movement: 0.  Period limb movements with arousals: 0.  Titration portion: Periodic limb movement: 2.5.  Period limb movements with arousals: 0.3.  Restroom trips: none.  OXYGEN DATA:  Baseline oxygenation: 96%. Lowest SaO2: 76%. Time spent below SaO2 90%: 6.3 minutes. Supplemental oxygen used: none.  IMPRESSION/ RECOMMENDATION:   This study shows evidence for severe obstructive sleep apnea with an AHI of 33.6 and SaO2 low of 76%.  He did well with CPAP 8 cm H2O.  He was observed in REM and supine sleep at this pressure setting.  He was fitted with a Fisher Paykel medium sized Simplus full face mask.   Chesley Mires, M.D. Diplomate, Tax adviser of Sleep Medicine  ELECTRONICALLY SIGNED ON:  07/13/2014, 11:28 AM [place] PH: [phone]   FX: [phone] [place]

## 2014-07-18 ENCOUNTER — Telehealth: Payer: Self-pay | Admitting: *Deleted

## 2014-07-18 NOTE — Telephone Encounter (Signed)
Called pt and advised him we has a Good Rx card he could try to get the cost of his Januvia down. Pt to come by on 07/24/2014 to pick card up.

## 2014-07-18 NOTE — Telephone Encounter (Signed)
Patient says he does not have the coupon card for the [place].  He's unable to afford this medication. Please call patient back.  CB# [phone]

## 2014-07-28 ENCOUNTER — Ambulatory Visit (HOSPITAL_COMMUNITY): Payer: BC Managed Care – PPO

## 2014-07-31 ENCOUNTER — Telehealth: Payer: Self-pay | Admitting: Endocrinology

## 2014-07-31 NOTE — Telephone Encounter (Signed)
Patient states Dr. Loanne Drilling told him to call over here to see if we had a [place] discount card.  Please advise.  Thanks!

## 2014-08-01 NOTE — Telephone Encounter (Signed)
Pt called this morning requesting status on januvia inform him we didn't have cards or samples...Spencer English

## 2014-08-07 ENCOUNTER — Telehealth: Payer: Self-pay | Admitting: Endocrinology

## 2014-08-07 NOTE — Telephone Encounter (Signed)
Patient ask if you would call him please. Thank you

## 2014-08-07 NOTE — Telephone Encounter (Signed)
Patient need to talk to concerning his Januvia 100 mg, please call.

## 2014-08-07 NOTE — Telephone Encounter (Signed)
Requested call back to discuss.  

## 2014-08-08 ENCOUNTER — Other Ambulatory Visit: Payer: Self-pay

## 2014-08-08 MED ORDER — NATEGLINIDE 120 MG PO TABS: 120.0000 mg | ORAL_TABLET | Freq: Three times a day (TID) | ORAL | Status: DC

## 2014-08-08 MED ORDER — METHIMAZOLE 10 MG PO TABS: ORAL_TABLET | ORAL | Status: DC

## 2014-08-08 NOTE — Telephone Encounter (Signed)
Refill for Tapazole.

## 2014-08-08 NOTE — Telephone Encounter (Signed)
Pt advised and he states that he was able to get his Januvia. Pt will cancel rx for Nateglinide.

## 2014-08-08 NOTE — Telephone Encounter (Signed)
Ok, please change januvia to "nateglinide." i have sent a prescription to your pharmacy

## 2014-08-08 NOTE — Telephone Encounter (Signed)
Called pt and he states that he cannot afford his Januvia. He has used the discount card for the free 30 day supply. Pt states it will cost 400$ a month for the medication. Pt does not want to try Invokanna either due to the side effects.  Please advise, Thanks!

## 2014-08-08 NOTE — Telephone Encounter (Signed)
There is a coupon, at [email or web address], to get it for $5 per month.

## 2014-08-08 NOTE — Telephone Encounter (Signed)
Pt states when he applies for this coupon the site states he is ineligible and cannot print coupon. Please advise, Thanks!

## 2014-08-10 ENCOUNTER — Telehealth: Payer: Self-pay

## 2014-08-10 ENCOUNTER — Other Ambulatory Visit (HOSPITAL_COMMUNITY): Payer: BC Managed Care – PPO

## 2014-08-10 NOTE — Telephone Encounter (Signed)
Diabetic Bundle lvom for pt to call back and schedule Lab appointment for Lipid panel to be drawn.

## 2014-08-15 ENCOUNTER — Encounter: Payer: Self-pay | Admitting: Pulmonary Disease

## 2014-08-15 ENCOUNTER — Ambulatory Visit (INDEPENDENT_AMBULATORY_CARE_PROVIDER_SITE_OTHER): Payer: BC Managed Care – PPO | Admitting: Pulmonary Disease

## 2014-08-15 VITALS — BP 160/94 | HR 60 | Temp 98.1°F | Ht 71.5 in | Wt 284.8 lb

## 2014-08-15 DIAGNOSIS — G479 Sleep disorder, unspecified: Secondary | ICD-10-CM

## 2014-08-15 NOTE — Assessment & Plan Note (Signed)
Cleared for CDL -note will be sent to dr Estevan Ryder, good compliance & results with CPAP Weight loss encouraged, compliance with goal of at least 4-6 hrs every night is the expectation. Advised against medications with sedative side effects Cautioned against driving when sleepy - understanding that sleepiness will vary on a day to day basis

## 2014-08-15 NOTE — Patient Instructions (Signed)
Weight loss encouraged, compliance with goal of at least 6 hrs every night is the expectation. Advised against medications with sedative side effects Cautioned against driving when sleepy - understanding that sleepiness will vary on a day to day basis

## 2014-08-15 NOTE — Progress Notes (Signed)
   Subjective:    Patient ID: Spencer English, male    DOB: 1966-04-04, 48 y.o.   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 Examining dr says pt needs sleep study to be certified. He was given a 60 day extension. He says CDL will expire on July 4 th.  He denies excessive sleepiness while driving or with other social situations.   He had a pulmonary embolism in 03/2009 and was placed on Coumadin. His risk factor is his sedentary life and long hours of sitting while driving-hence he was advised to stay on Coumadin as long as he did this kind of job. Followup CT in June in 9/13 showed resolved PE, also VQ scan in 12/2012 was low probability.  He sees Dr. Rayann Heman for paroxysmal A. Fib.  Echo in 12/2012 showed moderate mitral regurgitation, RVSP 43 which is attributed to residue from PE. Repeat echo is planned.   Chief Complaint  Patient presents with  . Follow-up    w/ download. pt wearing CPAP everynight x 7 hrs a night. Pt reports he is feeling rested.     PSG 07/03/14 >> AHI 33.6, SaO2 low 76%. CPAP 8 cm H2O  Download 07/2014 - good compliance >7h, no residuals,no leak Feels better rested, no snoring per wife, denies sleepiness while driving  Review of Systems neg for any significant sore throat, dysphagia, itching, sneezing, nasal congestion or excess/ purulent secretions, fever, chills, sweats, unintended wt loss, pleuritic or exertional cp, hempoptysis, orthopnea pnd or change in chronic leg swelling. Also denies presyncope, palpitations, heartburn, abdominal pain, nausea, vomiting, diarrhea or change in bowel or urinary habits, dysuria,hematuria, rash, arthralgias, visual complaints, headache, numbness weakness or ataxia.     Objective:   Physical Exam  Gen. Pleasant, obese, in no distress ENT - no lesions, no post nasal drip Neck: No JVD, no thyromegaly,  no carotid bruits Lungs: no use of accessory muscles, no dullness to percussion, decreased without rales or rhonchi  Cardiovascular: Rhythm regular, heart sounds  normal, no murmurs or gallops, no peripheral edema Musculoskeletal: No deformities, no cyanosis or clubbing , no tremors       Assessment & Plan:

## 2014-08-17 ENCOUNTER — Encounter (HOSPITAL_BASED_OUTPATIENT_CLINIC_OR_DEPARTMENT_OTHER): Payer: BC Managed Care – PPO

## 2014-09-09 ENCOUNTER — Other Ambulatory Visit: Payer: Self-pay | Admitting: Endocrinology

## 2014-09-09 ENCOUNTER — Other Ambulatory Visit: Payer: Self-pay | Admitting: Internal Medicine

## 2014-09-14 ENCOUNTER — Other Ambulatory Visit: Payer: Self-pay | Admitting: Endocrinology

## 2014-09-19 ENCOUNTER — Other Ambulatory Visit: Payer: Self-pay

## 2014-09-20 ENCOUNTER — Ambulatory Visit: Payer: BC Managed Care – PPO | Admitting: Internal Medicine

## 2014-09-29 ENCOUNTER — Other Ambulatory Visit (HOSPITAL_COMMUNITY): Payer: BC Managed Care – PPO

## 2014-10-03 ENCOUNTER — Other Ambulatory Visit: Payer: Self-pay

## 2014-10-04 ENCOUNTER — Ambulatory Visit: Payer: BC Managed Care – PPO | Admitting: Internal Medicine

## 2014-10-06 ENCOUNTER — Other Ambulatory Visit (HOSPITAL_COMMUNITY): Payer: BC Managed Care – PPO

## 2014-10-09 ENCOUNTER — Encounter (HOSPITAL_COMMUNITY): Payer: Self-pay | Admitting: Endocrinology

## 2014-10-12 ENCOUNTER — Other Ambulatory Visit: Payer: Self-pay | Admitting: Endocrinology

## 2014-10-13 ENCOUNTER — Telehealth: Payer: Self-pay | Admitting: Endocrinology

## 2014-10-13 MED ORDER — METOPROLOL TARTRATE 25 MG PO TABS
12.5000 mg | ORAL_TABLET | Freq: Two times a day (BID) | ORAL | Status: DC
Start: 2014-10-13 — End: 2014-12-25

## 2014-10-13 MED ORDER — FUROSEMIDE 20 MG PO TABS: ORAL_TABLET | ORAL | Status: DC

## 2014-10-13 NOTE — Telephone Encounter (Signed)
Rx sent to pharmacy   

## 2014-10-13 NOTE — Telephone Encounter (Signed)
Please advise if ok to refill metoprolol and Warfarin.  Thanks!

## 2014-10-13 NOTE — Telephone Encounter (Signed)
No refill on warfarin--it has to come from coumadin clinic [place] to refill the other 2 meds, x 1 cpx is due

## 2014-10-13 NOTE — Telephone Encounter (Signed)
Patient need refill of Metoprolol 25 mg, Furosemide 20 mg, Warfarin 5 mg,

## 2014-10-13 NOTE — Telephone Encounter (Signed)
Pa 

## 2014-10-20 ENCOUNTER — Ambulatory Visit (INDEPENDENT_AMBULATORY_CARE_PROVIDER_SITE_OTHER): Payer: BC Managed Care – PPO

## 2014-10-20 DIAGNOSIS — Z23 Encounter for immunization: Secondary | ICD-10-CM

## 2014-10-30 ENCOUNTER — Ambulatory Visit (INDEPENDENT_AMBULATORY_CARE_PROVIDER_SITE_OTHER): Payer: BC Managed Care – PPO | Admitting: *Deleted

## 2014-10-30 ENCOUNTER — Telehealth: Payer: Self-pay | Admitting: *Deleted

## 2014-10-30 DIAGNOSIS — I2699 Other pulmonary embolism without acute cor pulmonale: Secondary | ICD-10-CM

## 2014-10-30 DIAGNOSIS — I82409 Acute embolism and thrombosis of unspecified deep veins of unspecified lower extremity: Secondary | ICD-10-CM

## 2014-10-30 DIAGNOSIS — Z7901 Long term (current) use of anticoagulants: Secondary | ICD-10-CM

## 2014-10-30 LAB — POCT INR: INR: 1.2

## 2014-10-30 MED ORDER — WARFARIN SODIUM 5 MG PO TABS: ORAL_TABLET | ORAL | Status: DC

## 2014-10-30 NOTE — Telephone Encounter (Signed)
Spoke with pharmacy and gave clarification that pt had not been seen since June 2015 not June 2014 and she states understanding

## 2014-11-17 ENCOUNTER — Ambulatory Visit: Payer: BC Managed Care – PPO | Admitting: Adult Health

## 2014-11-19 ENCOUNTER — Encounter (HOSPITAL_BASED_OUTPATIENT_CLINIC_OR_DEPARTMENT_OTHER): Payer: Self-pay | Admitting: *Deleted

## 2014-11-19 ENCOUNTER — Emergency Department (HOSPITAL_BASED_OUTPATIENT_CLINIC_OR_DEPARTMENT_OTHER)
Admission: EM | Admit: 2014-11-19 | Discharge: 2014-11-19 | Disposition: A | Discharge status: Home / Self Care | Payer: BC Managed Care – PPO | Attending: Emergency Medicine | Admitting: Emergency Medicine

## 2014-11-19 DIAGNOSIS — Z872 Personal history of diseases of the skin and subcutaneous tissue: Secondary | ICD-10-CM | POA: Insufficient documentation

## 2014-11-19 DIAGNOSIS — K029 Dental caries, unspecified: Secondary | ICD-10-CM | POA: Diagnosis not present

## 2014-11-19 DIAGNOSIS — K047 Periapical abscess without sinus: Secondary | ICD-10-CM | POA: Diagnosis not present

## 2014-11-19 DIAGNOSIS — Z86711 Personal history of pulmonary embolism: Secondary | ICD-10-CM | POA: Insufficient documentation

## 2014-11-19 DIAGNOSIS — Z7952 Long term (current) use of systemic steroids: Secondary | ICD-10-CM | POA: Diagnosis not present

## 2014-11-19 DIAGNOSIS — N528 Other male erectile dysfunction: Secondary | ICD-10-CM | POA: Insufficient documentation

## 2014-11-19 DIAGNOSIS — Z7901 Long term (current) use of anticoagulants: Secondary | ICD-10-CM | POA: Insufficient documentation

## 2014-11-19 DIAGNOSIS — E785 Hyperlipidemia, unspecified: Secondary | ICD-10-CM | POA: Diagnosis not present

## 2014-11-19 DIAGNOSIS — Z87891 Personal history of nicotine dependence: Secondary | ICD-10-CM | POA: Insufficient documentation

## 2014-11-19 DIAGNOSIS — I4891 Unspecified atrial fibrillation: Secondary | ICD-10-CM | POA: Insufficient documentation

## 2014-11-19 DIAGNOSIS — Z87442 Personal history of urinary calculi: Secondary | ICD-10-CM | POA: Insufficient documentation

## 2014-11-19 DIAGNOSIS — K088 Other specified disorders of teeth and supporting structures: Secondary | ICD-10-CM | POA: Insufficient documentation

## 2014-11-19 DIAGNOSIS — Z7951 Long term (current) use of inhaled steroids: Secondary | ICD-10-CM | POA: Insufficient documentation

## 2014-11-19 DIAGNOSIS — K0889 Other specified disorders of teeth and supporting structures: Secondary | ICD-10-CM

## 2014-11-19 DIAGNOSIS — I10 Essential (primary) hypertension: Secondary | ICD-10-CM | POA: Insufficient documentation

## 2014-11-19 DIAGNOSIS — K219 Gastro-esophageal reflux disease without esophagitis: Secondary | ICD-10-CM | POA: Diagnosis not present

## 2014-11-19 DIAGNOSIS — Z86718 Personal history of other venous thrombosis and embolism: Secondary | ICD-10-CM | POA: Insufficient documentation

## 2014-11-19 DIAGNOSIS — Z862 Personal history of diseases of the blood and blood-forming organs and certain disorders involving the immune mechanism: Secondary | ICD-10-CM | POA: Diagnosis not present

## 2014-11-19 DIAGNOSIS — E119 Type 2 diabetes mellitus without complications: Secondary | ICD-10-CM | POA: Diagnosis not present

## 2014-11-19 DIAGNOSIS — M109 Gout, unspecified: Secondary | ICD-10-CM | POA: Insufficient documentation

## 2014-11-19 DIAGNOSIS — K0381 Cracked tooth: Secondary | ICD-10-CM | POA: Diagnosis not present

## 2014-11-19 DIAGNOSIS — Z79899 Other long term (current) drug therapy: Secondary | ICD-10-CM | POA: Insufficient documentation

## 2014-11-19 MED ORDER — HYDROCODONE-ACETAMINOPHEN 5-325 MG PO TABS: 1.0000 | ORAL_TABLET | Freq: Four times a day (QID) | ORAL | Status: DC | PRN

## 2014-11-19 MED ORDER — CLINDAMYCIN HCL 150 MG PO CAPS: 300.0000 mg | ORAL_CAPSULE | Freq: Three times a day (TID) | ORAL | Status: DC

## 2014-11-19 NOTE — ED Notes (Signed)
MD at bedside. 

## 2014-11-19 NOTE — ED Notes (Signed)
Right lower molar broken off at gum x 3 weeks ago- c/o pain and drainage x 1 day

## 2014-11-19 NOTE — Discharge Instructions (Signed)

## 2014-11-19 NOTE — ED Provider Notes (Signed)
CSN: [id number]     Arrival date & time 11/19/14  1059 History   First MD Initiated Contact with Patient 11/19/14 1118     Chief Complaint  Patient presents with  . Dental Pain     (Consider location/radiation/quality/duration/timing/severity/associated sxs/prior Treatment) HPI  This is a 48 year old male who presents with dental pain. Patient reports that several weeks ago he fractured his right lower molar. He has contacted his dentist and has an appointment in 3 weeks. Over the last 24 hours, has had increasing thumb pain and swelling. Current pain is 4 out of 10. He is not taking anything for pain. He states that he is limited on what he can take for pain because he takes Coumadin. Patient denies any sore throat, difficulty swallowing, or fevers.  Past Medical History  Diagnosis Date  . DM 08/08/2008  . DYSLIPIDEMIA 04/26/2009  . GOUT 10/24/2010  . HYPERTENSION 07/21/2007  . PULMONARY EMBOLISM 05/03/2009  . DEEP VENOUS THROMBOPHLEBITIS, LEG, LEFT 05/05/2009  . ALLERGIC RHINITIS 04/12/2009  . UNSPECIFIED URINARY CALCULUS 07/04/2010  . 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 ECZEMA 05/23/2010  . Long term (current) use of anticoagulants 04/03/2011  . BACK PAIN, LUMBAR 01/14/2011  . 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 Hyperglycemia   . Leukopenia   . Atrial fibrillation   . GERD (gastroesophageal reflux disease)    Past Surgical History  Procedure Laterality Date  . Anterior cruciate ligament repair  2000    Right  . Knee arthroscopy      left knee  . Knee arthroscopy w/ acl reconstruction      right knee  . Cystectomy      back of head  . Knee arthroscopy  01/03/2013    Procedure: ARTHROSCOPY KNEE;  Surgeon: Yvette Rack., MD;  Location: [place];  Service: Orthopedics;  Laterality: Left;  Lavage Synovectomy, Removal of loose body   Family History  Problem Relation Age of Onset  . Heart disease Neg Hx     no premature CAD known  . Diabetes Mother   . Hypertension Mother   .  Hyperlipidemia Mother   . Diabetes Father   . Hypertension Father   . Hyperlipidemia Father    History  Substance Use Topics  . Smoking status: Former Smoker    Types: Cigars    Quit date: 01/07/2005  . Smokeless tobacco: Never Used  . Alcohol Use: No    Review of Systems  Constitutional: Negative for fever.  HENT: Positive for dental problem. Negative for sore throat and trouble swallowing.   Skin: Negative for color change.  All other systems reviewed and are negative.      Allergies  Metformin and Viagra  Home Medications   Prior to Admission medications   Medication Sig Start Date End Date Taking? Authorizing Provider  acetaminophen (TYLENOL) 325 MG tablet Take 2 tablets (650 mg total) by mouth every 6 (six) hours as needed for pain. 01/27/13  Yes Sorin June Leap, MD  allopurinol (ZYLOPRIM) 300 MG tablet Take 300 mg by mouth at bedtime. For gout flare-up. 02/06/14  Yes Renato Shin, MD  atorvastatin (LIPITOR) 20 MG tablet TAKE ONE TABLET BY MOUTH ONCE DAILY 10/13/14  Yes Renato Shin, MD  bromocriptine (PARLODEL) 2.5 MG tablet Take 0.5 tablets (1.25 mg total) by mouth at bedtime. 06/27/14  Yes Renato Shin, MD  fexofenadine (ALLEGRA) 180 MG tablet Take 180 mg by mouth daily as needed. For allergies.   Yes Historical Provider, MD  fluticasone (FLONASE) 50  MCG/ACT nasal spray Place 2 sprays into the nose daily as needed. For allergies. 10/10/13  Yes Renato Shin, MD  furosemide (LASIX) 20 MG tablet TAKE ONE TABLET BY MOUTH ONCE DAILY 10/13/14  Yes Renato Shin, MD  halobetasol (ULTRAVATE) 0.05 % cream APPLY CREAM TOPICALLY THREE TIMES DAILY AS NEEDED FOR RASH. 06/29/13  Yes Renato Shin, MD  metoprolol tartrate (LOPRESSOR) 25 MG tablet Take 0.5 tablets (12.5 mg total) by mouth 2 (two) times daily. 10/13/14  Yes Renato Shin, MD  verapamil (CALAN-SR) 240 MG CR tablet TAKE ONE TABLET BY MOUTH ONCE DAILY (APPOINTMENT  NEEDED  FOR  FURTHER  REFILLS) 09/11/14  Yes Renato Shin, MD   warfarin (COUMADIN) 5 MG tablet Take as directed by Anticoagulation clinic. 10/30/14  Yes Thompson Grayer, MD  clindamycin (CLEOCIN) 150 MG capsule Take 2 capsules (300 mg total) by mouth 3 (three) times daily. 11/19/14   Merryl Hacker, MD  furosemide (LASIX) 20 MG tablet TAKE ONE TABLET BY MOUTH ONCE DAILY 10/13/14   Renato Shin, MD  HYDROcodone-acetaminophen (NORCO/VICODIN) 5-325 MG per tablet Take 1 tablet by mouth every 6 (six) hours as needed for moderate pain or severe pain. 11/19/14   Merryl Hacker, MD  KERYDIN 5 % SOLN Pt has not started taking yet (06/26/14) 04/29/14   Historical Provider, MD  methimazole (TAPAZOLE) 10 MG tablet TAKE 2 TABLETS BY MOUTH 2 TIMES DAILY. 08/08/14   Renato Shin, MD  nateglinide (STARLIX) 120 MG tablet Take 1 tablet (120 mg total) by mouth 3 (three) times daily with meals. 08/08/14   Renato Shin, MD  terbinafine (LAMISIL) 250 MG tablet Pt has not started taking yet (06/26/14) 04/13/14   Historical Provider, MD   BP 163/102 mmHg  Pulse 79  Temp(Src) 99 F (37.2 C) (Oral)  Resp 20  Ht 6' (1.829 m)  Wt 260 lb (117.935 kg)  BMI 35.25 kg/m2  SpO2 96% Physical Exam  Constitutional: He is oriented to person, place, and time. He appears well-developed and well-nourished. No distress.  HENT:  Head: Normocephalic and atraumatic.  Fractured right lower premolar, otherwise multiple dental caries, no obvious abscess, no trismus  Eyes: Pupils are equal, round, and reactive to light.  Neck: Neck supple.  Cardiovascular: Normal rate and regular rhythm.   Pulmonary/Chest: Effort normal. No respiratory distress.  Lymphadenopathy:    He has no cervical adenopathy.  Neurological: He is alert and oriented to person, place, and time.  Skin: Skin is warm and dry.  Psychiatric: He has a normal mood and affect.  Nursing note and vitals reviewed.   ED Course  Procedures (including critical care time) Labs Review Labs Reviewed - No data to display  Imaging  Review No results found.   EKG Interpretation None      MDM   Final diagnoses:  Pain, dental  Dental infection   Patient presents with dental pain. Fractured right lower premolar without obvious abscess. Patient given Norco for pain. We'll place on clindamycin given that he is on Coumadin. Patient encouraged follow-up with his dentist as soon as possible. No evidence of Ludwigs.  After history, exam, and medical workup I feel the patient has been appropriately medically screened and is safe for discharge home. Pertinent diagnoses were discussed with the patient. Patient was given return precautions.   Merryl Hacker, MD 11/20/14 [phone]

## 2014-11-21 ENCOUNTER — Other Ambulatory Visit: Payer: Self-pay | Admitting: Endocrinology

## 2014-11-22 ENCOUNTER — Telehealth: Payer: Self-pay | Admitting: Endocrinology

## 2014-11-22 MED ORDER — VERAPAMIL HCL ER 240 MG PO TBCR: EXTENDED_RELEASE_TABLET | ORAL | Status: DC

## 2014-11-22 NOTE — Telephone Encounter (Signed)
Patient need a refill of Verapamil 240 mg

## 2014-11-22 NOTE — Telephone Encounter (Signed)
Rx sent to pharmacy   

## 2014-11-27 ENCOUNTER — Other Ambulatory Visit: Payer: Self-pay | Admitting: Endocrinology

## 2014-11-27 NOTE — Telephone Encounter (Signed)
please call patient: cpx is due. Also, are you taking [place]?

## 2014-11-27 NOTE — Telephone Encounter (Signed)
Please advise if ok to refill. Rx is not on current medication list.  Thanks!  

## 2014-12-08 ENCOUNTER — Encounter: Payer: BC Managed Care – PPO | Admitting: Endocrinology

## 2014-12-11 ENCOUNTER — Other Ambulatory Visit: Payer: Self-pay | Admitting: *Deleted

## 2014-12-11 ENCOUNTER — Telehealth: Payer: Self-pay | Admitting: Internal Medicine

## 2014-12-11 ENCOUNTER — Ambulatory Visit (INDEPENDENT_AMBULATORY_CARE_PROVIDER_SITE_OTHER): Payer: BC Managed Care – PPO | Admitting: Pharmacist

## 2014-12-11 DIAGNOSIS — I2699 Other pulmonary embolism without acute cor pulmonale: Secondary | ICD-10-CM

## 2014-12-11 DIAGNOSIS — I82409 Acute embolism and thrombosis of unspecified deep veins of unspecified lower extremity: Secondary | ICD-10-CM

## 2014-12-11 DIAGNOSIS — Z7901 Long term (current) use of anticoagulants: Secondary | ICD-10-CM

## 2014-12-11 LAB — POCT INR: INR: 2

## 2014-12-11 MED ORDER — WARFARIN SODIUM 5 MG PO TABS: ORAL_TABLET | ORAL | Status: DC

## 2014-12-11 NOTE — Telephone Encounter (Signed)
Spoke with pt and instructed that will give him enough coumadin until seen in clinic on Thursday December 17th and made an appt for himr to be seen then as he has not been seen since  November  2nd

## 2014-12-11 NOTE — Telephone Encounter (Signed)
New Prob    Pt is requesting an emergency refill of his Coumadin until he is able to come back in for follow up. Please call.

## 2014-12-14 ENCOUNTER — Encounter: Payer: BC Managed Care – PPO | Admitting: Endocrinology

## 2014-12-25 ENCOUNTER — Other Ambulatory Visit: Payer: Self-pay | Admitting: *Deleted

## 2014-12-25 ENCOUNTER — Telehealth: Payer: Self-pay | Admitting: Endocrinology

## 2014-12-25 MED ORDER — METOPROLOL TARTRATE 25 MG PO TABS: 12.5000 mg | ORAL_TABLET | Freq: Two times a day (BID) | ORAL | Status: DC

## 2014-12-25 MED ORDER — METHIMAZOLE 10 MG PO TABS: ORAL_TABLET | ORAL | Status: DC

## 2014-12-25 MED ORDER — FUROSEMIDE 20 MG PO TABS: 20.0000 mg | ORAL_TABLET | Freq: Every day | ORAL | Status: DC

## 2014-12-25 MED ORDER — ALLOPURINOL 300 MG PO TABS: 300.0000 mg | ORAL_TABLET | Freq: Every day | ORAL | Status: DC

## 2014-12-25 MED ORDER — BROMOCRIPTINE MESYLATE 2.5 MG PO TABS: 1.2500 mg | ORAL_TABLET | Freq: Every day | ORAL | Status: DC

## 2014-12-25 NOTE — Telephone Encounter (Signed)
rx sent for 30 day supply, needs appointment

## 2014-12-25 NOTE — Telephone Encounter (Signed)
Pt needs refills on metoprolol, methamazole, furosemide, alloprurinol, and bromocriptone.

## 2015-01-03 ENCOUNTER — Encounter: Payer: BC Managed Care – PPO | Admitting: Endocrinology

## 2015-01-04 ENCOUNTER — Telehealth: Payer: Self-pay | Admitting: Endocrinology

## 2015-01-04 NOTE — Telephone Encounter (Signed)
Patient no showed today's appt. Please advise on how to follow up. °A. No follow up necessary. °B. Follow up urgent. Contact patient immediately. °C. Follow up necessary. Contact patient and schedule visit in ___ days. °D. Follow up advised. Contact patient and schedule visit in ____weeks. ° °

## 2015-01-04 NOTE — Telephone Encounter (Signed)
Follow up advised. Contact patient and schedule cpx < 4 weeks.

## 2015-01-05 NOTE — Telephone Encounter (Signed)
No show letter sent to pt.

## 2015-01-23 ENCOUNTER — Ambulatory Visit (INDEPENDENT_AMBULATORY_CARE_PROVIDER_SITE_OTHER): Payer: BLUE CROSS/BLUE SHIELD | Admitting: *Deleted

## 2015-01-23 DIAGNOSIS — I82409 Acute embolism and thrombosis of unspecified deep veins of unspecified lower extremity: Secondary | ICD-10-CM

## 2015-01-23 DIAGNOSIS — Z7901 Long term (current) use of anticoagulants: Secondary | ICD-10-CM

## 2015-01-23 DIAGNOSIS — I2699 Other pulmonary embolism without acute cor pulmonale: Secondary | ICD-10-CM

## 2015-01-23 LAB — POCT INR: INR: 1.4

## 2015-01-23 MED ORDER — WARFARIN SODIUM 5 MG PO TABS: ORAL_TABLET | ORAL | Status: DC

## 2015-02-06 ENCOUNTER — Other Ambulatory Visit: Payer: Self-pay

## 2015-02-06 MED ORDER — SITAGLIPTIN PHOSPHATE 100 MG PO TABS: 100.0000 mg | ORAL_TABLET | Freq: Every day | ORAL | Status: DC

## 2015-02-08 LAB — HM DIABETES EYE EXAM

## 2015-02-09 ENCOUNTER — Ambulatory Visit (INDEPENDENT_AMBULATORY_CARE_PROVIDER_SITE_OTHER): Payer: BLUE CROSS/BLUE SHIELD | Admitting: *Deleted

## 2015-02-09 DIAGNOSIS — I82409 Acute embolism and thrombosis of unspecified deep veins of unspecified lower extremity: Secondary | ICD-10-CM

## 2015-02-09 DIAGNOSIS — I2699 Other pulmonary embolism without acute cor pulmonale: Secondary | ICD-10-CM

## 2015-02-09 DIAGNOSIS — Z7901 Long term (current) use of anticoagulants: Secondary | ICD-10-CM

## 2015-02-09 LAB — POCT INR: INR: 1.6

## 2015-03-02 ENCOUNTER — Encounter: Payer: Self-pay | Admitting: Endocrinology

## 2015-03-02 ENCOUNTER — Emergency Department (HOSPITAL_BASED_OUTPATIENT_CLINIC_OR_DEPARTMENT_OTHER)
Admission: EM | Admit: 2015-03-02 | Discharge: 2015-03-02 | Disposition: A | Discharge status: Home / Self Care | Payer: BLUE CROSS/BLUE SHIELD | Attending: Emergency Medicine | Admitting: Emergency Medicine

## 2015-03-02 ENCOUNTER — Encounter (HOSPITAL_BASED_OUTPATIENT_CLINIC_OR_DEPARTMENT_OTHER): Payer: Self-pay | Admitting: *Deleted

## 2015-03-02 DIAGNOSIS — E785 Hyperlipidemia, unspecified: Secondary | ICD-10-CM | POA: Diagnosis not present

## 2015-03-02 DIAGNOSIS — K0889 Other specified disorders of teeth and supporting structures: Secondary | ICD-10-CM

## 2015-03-02 DIAGNOSIS — Z7951 Long term (current) use of inhaled steroids: Secondary | ICD-10-CM | POA: Diagnosis not present

## 2015-03-02 DIAGNOSIS — Z7901 Long term (current) use of anticoagulants: Secondary | ICD-10-CM | POA: Insufficient documentation

## 2015-03-02 DIAGNOSIS — Z86711 Personal history of pulmonary embolism: Secondary | ICD-10-CM | POA: Insufficient documentation

## 2015-03-02 DIAGNOSIS — Z87442 Personal history of urinary calculi: Secondary | ICD-10-CM | POA: Diagnosis not present

## 2015-03-02 DIAGNOSIS — L03319 Cellulitis of trunk, unspecified: Secondary | ICD-10-CM | POA: Insufficient documentation

## 2015-03-02 DIAGNOSIS — E119 Type 2 diabetes mellitus without complications: Secondary | ICD-10-CM | POA: Diagnosis not present

## 2015-03-02 DIAGNOSIS — I10 Essential (primary) hypertension: Secondary | ICD-10-CM | POA: Diagnosis not present

## 2015-03-02 DIAGNOSIS — K0381 Cracked tooth: Secondary | ICD-10-CM | POA: Insufficient documentation

## 2015-03-02 DIAGNOSIS — M109 Gout, unspecified: Secondary | ICD-10-CM | POA: Insufficient documentation

## 2015-03-02 DIAGNOSIS — Z87438 Personal history of other diseases of male genital organs: Secondary | ICD-10-CM | POA: Diagnosis not present

## 2015-03-02 DIAGNOSIS — Z87891 Personal history of nicotine dependence: Secondary | ICD-10-CM | POA: Diagnosis not present

## 2015-03-02 DIAGNOSIS — I4891 Unspecified atrial fibrillation: Secondary | ICD-10-CM | POA: Diagnosis not present

## 2015-03-02 DIAGNOSIS — K088 Other specified disorders of teeth and supporting structures: Secondary | ICD-10-CM | POA: Insufficient documentation

## 2015-03-02 DIAGNOSIS — K219 Gastro-esophageal reflux disease without esophagitis: Secondary | ICD-10-CM | POA: Insufficient documentation

## 2015-03-02 DIAGNOSIS — K029 Dental caries, unspecified: Secondary | ICD-10-CM | POA: Insufficient documentation

## 2015-03-02 DIAGNOSIS — Z86718 Personal history of other venous thrombosis and embolism: Secondary | ICD-10-CM | POA: Insufficient documentation

## 2015-03-02 DIAGNOSIS — Z862 Personal history of diseases of the blood and blood-forming organs and certain disorders involving the immune mechanism: Secondary | ICD-10-CM | POA: Diagnosis not present

## 2015-03-02 MED ORDER — CLINDAMYCIN HCL 150 MG PO CAPS: 450.0000 mg | ORAL_CAPSULE | Freq: Three times a day (TID) | ORAL | Status: DC

## 2015-03-02 MED ORDER — HYDROCODONE-ACETAMINOPHEN 5-325 MG PO TABS
1.0000 | ORAL_TABLET | Freq: Four times a day (QID) | ORAL | Status: DC | PRN
Start: 2015-03-02 — End: 2016-06-16

## 2015-03-02 NOTE — ED Provider Notes (Signed)
CSN: [id number]     Arrival date & time 03/02/15  1437 History   First MD Initiated Contact with Patient 03/02/15 1611     Chief Complaint  Patient presents with  . Dental Pain     (Consider location/radiation/quality/duration/timing/severity/associated sxs/prior Treatment) HPI Spencer English is a 49 year old male who presents the ER complaining of dental pain. Patient states his tooth was broken approximate 4 months ago, and feels for the past 2 weeks that it may have become infected. Patient states he has followed up with his dentist, as dentist has recommended he follow-up with an oral surgeon. Patient is here for evaluation of the same tooth today. Patient denies oral swelling, dysphagia, fever, nausea, vomiting, shortness of breath.  Past Medical History  Diagnosis Date  . DM 08/08/2008  . DYSLIPIDEMIA 04/26/2009  . GOUT 10/24/2010  . HYPERTENSION 07/21/2007  . PULMONARY EMBOLISM 05/03/2009  . DEEP VENOUS THROMBOPHLEBITIS, LEG, LEFT 05/05/2009  . ALLERGIC RHINITIS 04/12/2009  . UNSPECIFIED URINARY CALCULUS 07/04/2010  . 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 ECZEMA 05/23/2010  . Long term (current) use of anticoagulants 04/03/2011  . BACK PAIN, LUMBAR 01/14/2011  . 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 Hyperglycemia   . Leukopenia   . Atrial fibrillation   . GERD (gastroesophageal reflux disease)    Past Surgical History  Procedure Laterality Date  . Anterior cruciate ligament repair  2000    Right  . Knee arthroscopy      left knee  . Knee arthroscopy w/ acl reconstruction      right knee  . Cystectomy      back of head  . Knee arthroscopy  01/03/2013    Procedure: ARTHROSCOPY KNEE;  Surgeon: Yvette Rack., MD;  Location: [place];  Service: Orthopedics;  Laterality: Left;  Lavage Synovectomy, Removal of loose body   Family History  Problem Relation Age of Onset  . Heart disease Neg Hx     no premature CAD known  . Diabetes Mother   . Hypertension Mother   . Hyperlipidemia Mother   .  Diabetes Father   . Hypertension Father   . Hyperlipidemia Father    History  Substance Use Topics  . Smoking status: Former Smoker    Types: Cigars    Quit date: 01/07/2005  . Smokeless tobacco: Never Used  . Alcohol Use: No    Review of Systems  Constitutional: Negative for fever.  HENT: Positive for dental problem.   Eyes: Negative for visual disturbance.  Respiratory: Negative for shortness of breath.   Cardiovascular: Negative for chest pain.  Gastrointestinal: Negative for nausea, vomiting and abdominal pain.  Genitourinary: Negative for dysuria.  Skin: Negative for rash.  Neurological: Negative for dizziness, syncope, weakness and numbness.  Psychiatric/Behavioral: Negative.       Allergies  Metformin and Viagra  Home Medications   Prior to Admission medications   Medication Sig Start Date End Date Taking? Authorizing Provider  acetaminophen (TYLENOL) 325 MG tablet Take 2 tablets (650 mg total) by mouth every 6 (six) hours as needed for pain. Patient not taking: Reported on 02/09/2015 01/27/13   Sorin June Leap, MD  allopurinol (ZYLOPRIM) 300 MG tablet Take 1 tablet (300 mg total) by mouth at bedtime. For gout flare-up. 12/25/14   Renato Shin, MD  atorvastatin (LIPITOR) 20 MG tablet TAKE ONE TABLET BY MOUTH ONCE DAILY 10/13/14   Renato Shin, MD  bromocriptine (PARLODEL) 2.5 MG tablet Take 0.5 tablets (1.25 mg total) by mouth at bedtime. 12/25/14  Renato Shin, MD  clindamycin (CLEOCIN) 150 MG capsule Take 3 capsules (450 mg total) by mouth 3 (three) times daily. 03/02/15   Carrie Mew, PA-C  fexofenadine (ALLEGRA) 180 MG tablet Take 180 mg by mouth daily as needed. For allergies.    Historical Provider, MD  fluticasone (FLONASE) 50 MCG/ACT nasal English Place 2 sprays into the nose daily as needed. For allergies. 10/10/13   Renato Shin, MD  furosemide (LASIX) 20 MG tablet TAKE ONE TABLET BY MOUTH ONCE DAILY 10/13/14   Renato Shin, MD  halobetasol (ULTRAVATE) 0.05 %  cream APPLY CREAM TOPICALLY THREE TIMES DAILY AS NEEDED FOR RASH. 06/29/13   Renato Shin, MD  HYDROcodone-acetaminophen (NORCO/VICODIN) 5-325 MG per tablet Take 1-2 tablets by mouth every 6 (six) hours as needed. 03/02/15   Carrie Mew, PA-C  KERYDIN 5 % SOLN Pt has not started taking yet (06/26/14) 04/29/14   Historical Provider, MD  methimazole (TAPAZOLE) 10 MG tablet TAKE 2 TABLETS BY MOUTH 2 TIMES DAILY. 12/25/14   Renato Shin, MD  metoprolol tartrate (LOPRESSOR) 25 MG tablet Take 0.5 tablets (12.5 mg total) by mouth 2 (two) times daily. 12/25/14   Renato Shin, MD  sitaGLIPtin (JANUVIA) 100 MG tablet Take 1 tablet (100 mg total) by mouth daily. 02/06/15   Renato Shin, MD  terbinafine (LAMISIL) 250 MG tablet Pt has not started taking yet (06/26/14) 04/13/14   Historical Provider, MD  verapamil (CALAN-SR) 240 MG CR tablet TAKE ONE TABLET BY MOUTH ONCE DAILY 11/22/14   Renato Shin, MD  warfarin (COUMADIN) 5 MG tablet Take as directed by Anticoagulation clinic. 01/23/15   Burnell Blanks, MD   BP 156/94 mmHg  Pulse 78  Temp(Src) 98.8 F (37.1 C) (Oral)  Resp 16  Ht 5' 11.5" (1.816 m)  Wt 270 lb (122.471 kg)  BMI 37.14 kg/m2  SpO2 98% Physical Exam  Constitutional: He is oriented to person, place, and time. He appears well-developed and well-nourished. No distress.  HENT:  Head: Normocephalic and atraumatic.  Mouth/Throat: Uvula is midline, oropharynx is clear and moist and mucous membranes are normal. No trismus in the jaw. No uvula swelling. No oropharyngeal exudate, posterior oropharyngeal edema, posterior oropharyngeal erythema or tonsillar abscesses.    Dental caries noted with broken tooth on right lower molar. Mild swelling to gum line with small, 0.5 cm in diameter raised lesion. Surrounding tissue non-fluctuant, nonerythematous. Floor of mouth soft, no signs of surrounding cellulitis. Mild anterior cervical lymphadenopathy.  Eyes: Right eye exhibits no discharge. Left eye  exhibits no discharge. No scleral icterus.  Neck: Normal range of motion.  Pulmonary/Chest: Effort normal. No respiratory distress.    Musculoskeletal: Normal range of motion.  Neurological: He is alert and oriented to person, place, and time.  Skin: Skin is warm and dry. He is not diaphoretic.  Psychiatric: He has a normal mood and affect.  Nursing note and vitals reviewed.   ED Course  Procedures (including critical care time) Labs Review Labs Reviewed - No data to display  Imaging Review No results found.   EKG Interpretation None      MDM   Final diagnoses:  Pain, dental  Cellulitis of trunk, unspecified site   Patient with toothache.  Patient has small, raised lesion/papule on the outside gumline of patient's broken tooth. This papule is somewhat hard to touch, I offered to attempt incision and drainage in case it were an abscess, and patient states he has had this lump in the past, and typically  goes down with antibiotics. I discussed the benefits and risks of doing an incision and drainage of what could be a possible dental abscess, and patient states he feels the risks do not outweigh the benefits, and would prefer to take oral antibiotics and follow-up with oral surgeon. I discussed strict return precautions with patient, and advised him of the dangers of refusing I&D at this time could lead to spread of infection and worsening of his disease. Patient states he understands these risks, and is adamant in his decision. Patient has also small area approximately 2 cm diameter lesion consistent with a small lesion of ossicle cellulitis. No obvious abscess. There is an excoriation in the middle where patient's wife has been draining this site. There is nothing obvious that I am able to drain, and there is a small opening should there be any fluid to drain. Decision was made to the patient on clindamycin to cover for both dental abscess and for possible cellulitis. Exam unconcerning  for Ludwig's angina or spread of infection.  Will treat with penicillin and pain medicine.  Urged patient to follow-up with dentist for patient's dental pain, and primary care provider for patient's skin lesion on his back. I discussed return precautions with patient , and patient verbalized understanding and agreement with this plan. I encouraged patient to call or return to the ER should he have any questions or concerns.  BP 156/94 mmHg  Pulse 78  Temp(Src) 98.8 F (37.1 C) (Oral)  Resp 16  Ht 5' 11.5" (1.816 m)  Wt 270 lb (122.471 kg)  BMI 37.14 kg/m2  SpO2 98%  Signed,  Dahlia Bailiff, PA-C 12:10 AM  Patient discussed with Dr. Malvin Johns, MD   Carrie Mew, PA-C 03/03/15 0010  Malvin Johns, MD 03/03/15 [phone]

## 2015-03-02 NOTE — Discharge Instructions (Signed)
Follow-up with your oral surgeon, and with your primary care doctor. Return to the ER if any high fever, severe swelling, worsening of your swelling, inability to swallow, shortness of breath, or oral swelling.   Dental Pain A tooth ache may be caused by cavities (tooth decay). Cavities expose the nerve of the tooth to air and hot or cold temperatures. It may come from an infection or abscess (also called a boil or furuncle) around your tooth. It is also often caused by dental caries (tooth decay). This causes the pain you are having. DIAGNOSIS  Your caregiver can diagnose this problem by exam. TREATMENT   If caused by an infection, it may be treated with medications which kill germs (antibiotics) and pain medications as prescribed by your caregiver. Take medications as directed.  Only take over-the-counter or prescription medicines for pain, discomfort, or fever as directed by your caregiver.  Whether the tooth ache today is caused by infection or dental disease, you should see your dentist as soon as possible for further care. SEEK MEDICAL CARE IF: The exam and treatment you received today has been provided on an emergency basis only. This is not a substitute for complete medical or dental care. If your problem worsens or new problems (symptoms) appear, and you are unable to meet with your dentist, call or return to this location. SEEK IMMEDIATE MEDICAL CARE IF:   You have a fever.  You develop redness and swelling of your face, jaw, or neck.  You are unable to open your mouth.  You have severe pain uncontrolled by pain medicine. MAKE SURE YOU:   Understand these instructions.  Will watch your condition.  Will get help right away if you are not doing well or get worse. Document Released: 12/15/2005 Document Revised: 03/08/2012 Document Reviewed: 08/02/2008 [facility] Patient Information 2015 [place], [place]. This information is not intended to replace advice given to you by your  health care provider. Make sure you discuss any questions you have with your health care provider.   Cellulitis Cellulitis is an infection of the skin and the tissue beneath it. The infected area is usually red and tender. Cellulitis occurs most often in the arms and lower legs.  CAUSES  Cellulitis is caused by bacteria that enter the skin through cracks or cuts in the skin. The most common types of bacteria that cause cellulitis are staphylococci and streptococci. SIGNS AND SYMPTOMS   Redness and warmth.  Swelling.  Tenderness or pain.  Fever. DIAGNOSIS  Your health care provider can usually determine what is wrong based on a physical exam. Blood tests may also be done. TREATMENT  Treatment usually involves taking an antibiotic medicine. HOME CARE INSTRUCTIONS   Take your antibiotic medicine as directed by your health care provider. Finish the antibiotic even if you start to feel better.  Keep the infected arm or leg elevated to reduce swelling.  Apply a warm cloth to the affected area up to 4 times per day to relieve pain.  Take medicines only as directed by your health care provider.  Keep all follow-up visits as directed by your health care provider. SEEK MEDICAL CARE IF:   You notice red streaks coming from the infected area.  Your red area gets larger or turns dark in color.  Your bone or joint underneath the infected area becomes painful after the skin has healed.  Your infection returns in the same area or another area.  You notice a swollen bump in the infected area.  You develop new symptoms.  You have a fever. SEEK IMMEDIATE MEDICAL CARE IF:   You feel very sleepy.  You develop vomiting or diarrhea.  You have a general ill feeling (malaise) with muscle aches and pains. MAKE SURE YOU:   Understand these instructions.  Will watch your condition.  Will get help right away if you are not doing well or get worse. Document Released: 09/24/2005 Document  Revised: 05/01/2014 Document Reviewed: 03/01/2012 [facility] Patient Information 2015 [place], [place]. This information is not intended to replace advice given to you by your health care provider. Make sure you discuss any questions you have with your health care provider.

## 2015-03-02 NOTE — ED Notes (Signed)
Pt amb to triage with quick steady gait smiling in nad. Pt reports right lower tooth pain x 4 months, states "It's now infected, I can tell"

## 2015-03-26 ENCOUNTER — Other Ambulatory Visit: Payer: Self-pay | Admitting: Endocrinology

## 2015-03-28 ENCOUNTER — Ambulatory Visit (INDEPENDENT_AMBULATORY_CARE_PROVIDER_SITE_OTHER): Payer: BLUE CROSS/BLUE SHIELD | Admitting: *Deleted

## 2015-03-28 DIAGNOSIS — I82409 Acute embolism and thrombosis of unspecified deep veins of unspecified lower extremity: Secondary | ICD-10-CM

## 2015-03-28 DIAGNOSIS — Z7901 Long term (current) use of anticoagulants: Secondary | ICD-10-CM

## 2015-03-28 DIAGNOSIS — I2699 Other pulmonary embolism without acute cor pulmonale: Secondary | ICD-10-CM | POA: Diagnosis not present

## 2015-03-28 LAB — POCT INR: INR: 1.8

## 2015-03-28 MED ORDER — WARFARIN SODIUM 5 MG PO TABS
ORAL_TABLET | ORAL | Status: DC
Start: 2015-03-28 — End: 2015-05-01

## 2015-04-16 ENCOUNTER — Telehealth: Payer: Self-pay | Admitting: Endocrinology

## 2015-04-16 NOTE — Telephone Encounter (Signed)
Patient called and would like to know if there is a preference for detox cleanse while being a diabetic   Please advise patient    Thank You

## 2015-04-16 NOTE — Telephone Encounter (Signed)
Unable to reach pt. Will try again at a later time.  

## 2015-04-16 NOTE — Telephone Encounter (Signed)
Pt advised of note below and voiced understanding.  

## 2015-04-16 NOTE — Telephone Encounter (Signed)
See note below and please advise during Dr. Ellison's absence? Thanks! 

## 2015-04-16 NOTE — Telephone Encounter (Signed)
There is no such thing specifically for diabetes

## 2015-05-01 ENCOUNTER — Ambulatory Visit (INDEPENDENT_AMBULATORY_CARE_PROVIDER_SITE_OTHER): Payer: BLUE CROSS/BLUE SHIELD | Admitting: *Deleted

## 2015-05-01 DIAGNOSIS — I82409 Acute embolism and thrombosis of unspecified deep veins of unspecified lower extremity: Secondary | ICD-10-CM

## 2015-05-01 DIAGNOSIS — I2699 Other pulmonary embolism without acute cor pulmonale: Secondary | ICD-10-CM

## 2015-05-01 DIAGNOSIS — Z7901 Long term (current) use of anticoagulants: Secondary | ICD-10-CM | POA: Diagnosis not present

## 2015-05-01 LAB — POCT INR: INR: 1.9

## 2015-05-01 MED ORDER — WARFARIN SODIUM 5 MG PO TABS
ORAL_TABLET | ORAL | Status: DC
Start: 2015-05-01 — End: 2015-06-28

## 2015-05-09 ENCOUNTER — Telehealth: Payer: Self-pay | Admitting: Endocrinology

## 2015-05-10 ENCOUNTER — Other Ambulatory Visit: Payer: Self-pay | Admitting: Endocrinology

## 2015-05-10 NOTE — Telephone Encounter (Signed)
Patient need refill of Verapamil, Furosemide, Paroxetine.

## 2015-05-11 NOTE — Telephone Encounter (Signed)
Rx's refilled. 

## 2015-05-15 ENCOUNTER — Telehealth: Payer: Self-pay | Admitting: Endocrinology

## 2015-05-15 DIAGNOSIS — Z129 Encounter for screening for malignant neoplasm, site unspecified: Secondary | ICD-10-CM | POA: Insufficient documentation

## 2015-05-15 NOTE — Telephone Encounter (Signed)
See below

## 2015-05-15 NOTE — Addendum Note (Signed)
Addended by: Renato Shin on: 05/15/2015 03:59 PM   Modules accepted: Orders

## 2015-05-15 NOTE — Telephone Encounter (Signed)
cpx is due.  Please schedule. Also, insurance pays for1st colonoscopy is at age 49. Do you have a reason to need it sooner?  Please let me know, so i can order.

## 2015-05-15 NOTE — Telephone Encounter (Signed)
I contacted the patient and advised of note below. Patient stated he has been having blood in his semen and wanted to make sure he did not have any polyps. Patient stated he has done some research and he found this could be a cause of the blood in his semen.  Please advise, patient stated at this time he could not schedule his physical. Wanted to know if he should come in for a follow up to be evaluated?

## 2015-05-15 NOTE — Telephone Encounter (Signed)
Ok, then please schedule f/u visit. The blood is harmless. i'll schedule the colonoscopy to be done after you turn 49

## 2015-05-15 NOTE — Telephone Encounter (Signed)
Pt requesting a referral for colonoscopy please

## 2015-05-16 ENCOUNTER — Telehealth: Payer: Self-pay | Admitting: Endocrinology

## 2015-05-16 NOTE — Telephone Encounter (Signed)
error 

## 2015-05-17 ENCOUNTER — Encounter: Payer: Self-pay | Admitting: Gastroenterology

## 2015-05-31 ENCOUNTER — Ambulatory Visit: Payer: BLUE CROSS/BLUE SHIELD | Admitting: Endocrinology

## 2015-06-13 ENCOUNTER — Ambulatory Visit: Payer: BLUE CROSS/BLUE SHIELD | Admitting: Gastroenterology

## 2015-06-14 ENCOUNTER — Other Ambulatory Visit: Payer: Self-pay

## 2015-06-14 MED ORDER — FUROSEMIDE 20 MG PO TABS: ORAL_TABLET | ORAL | Status: DC

## 2015-06-14 MED ORDER — SITAGLIPTIN PHOSPHATE 100 MG PO TABS: 100.0000 mg | ORAL_TABLET | Freq: Every day | ORAL | Status: DC

## 2015-06-14 MED ORDER — METHIMAZOLE 10 MG PO TABS: ORAL_TABLET | ORAL | Status: DC

## 2015-06-14 MED ORDER — VERAPAMIL HCL ER 240 MG PO TBCR: 240.0000 mg | EXTENDED_RELEASE_TABLET | Freq: Every day | ORAL | Status: DC

## 2015-06-25 ENCOUNTER — Ambulatory Visit: Payer: BLUE CROSS/BLUE SHIELD | Admitting: Endocrinology

## 2015-06-28 ENCOUNTER — Ambulatory Visit (INDEPENDENT_AMBULATORY_CARE_PROVIDER_SITE_OTHER): Payer: BLUE CROSS/BLUE SHIELD | Admitting: *Deleted

## 2015-06-28 DIAGNOSIS — I2782 Chronic pulmonary embolism: Secondary | ICD-10-CM | POA: Diagnosis not present

## 2015-06-28 DIAGNOSIS — I82402 Acute embolism and thrombosis of unspecified deep veins of left lower extremity: Secondary | ICD-10-CM | POA: Diagnosis not present

## 2015-06-28 LAB — POCT INR: INR: 4.2

## 2015-06-28 MED ORDER — WARFARIN SODIUM 5 MG PO TABS: ORAL_TABLET | ORAL | Status: DC

## 2015-07-31 ENCOUNTER — Ambulatory Visit (INDEPENDENT_AMBULATORY_CARE_PROVIDER_SITE_OTHER): Payer: BLUE CROSS/BLUE SHIELD | Admitting: *Deleted

## 2015-07-31 DIAGNOSIS — I82402 Acute embolism and thrombosis of unspecified deep veins of left lower extremity: Secondary | ICD-10-CM | POA: Diagnosis not present

## 2015-07-31 DIAGNOSIS — I2782 Chronic pulmonary embolism: Secondary | ICD-10-CM

## 2015-07-31 LAB — POCT INR: INR: 2.1

## 2015-08-07 ENCOUNTER — Telehealth: Payer: Self-pay | Admitting: Endocrinology

## 2015-08-07 NOTE — Telephone Encounter (Signed)
Patient called and did not want to leave a detailed message   Please have Jinny Blossom return his call    Thank you

## 2015-08-08 NOTE — Telephone Encounter (Signed)
I contacted the pt. Pt stated he had gotten bit by a bug and his arm is red. Pt wanted to see if he could try and get the stinger out with tweezers. Pt was advised if the stinger was close to the surface of the skin he could attempt to remove it. Pt was advised to keep the area clean to prevent infection. Pt voiced understanding.

## 2015-08-10 ENCOUNTER — Telehealth: Payer: Self-pay | Admitting: Endocrinology

## 2015-08-10 MED ORDER — FUROSEMIDE 20 MG PO TABS: ORAL_TABLET | ORAL | Status: DC

## 2015-08-10 MED ORDER — SITAGLIPTIN PHOSPHATE 100 MG PO TABS: 100.0000 mg | ORAL_TABLET | Freq: Every day | ORAL | Status: DC

## 2015-08-10 NOTE — Telephone Encounter (Signed)
Patient need a refill of furosemide (LASIX) 20 MG tablet send to [facility],  - [address] [phone] (Phone) [phone] (Fax)       And a refill of sitaGLIPtin (JANUVIA) 100 MG tablet send to  [id number] - [place], [place] AT [address] [phone] (Phone) [phone] (Fax)

## 2015-08-10 NOTE — Telephone Encounter (Signed)
Rx's sent per pt's request.

## 2015-08-21 ENCOUNTER — Ambulatory Visit: Payer: BLUE CROSS/BLUE SHIELD | Admitting: Gastroenterology

## 2015-08-29 ENCOUNTER — Telehealth: Payer: Self-pay | Admitting: Endocrinology

## 2015-08-29 ENCOUNTER — Other Ambulatory Visit: Payer: Self-pay | Admitting: Endocrinology

## 2015-08-29 NOTE — Telephone Encounter (Signed)
Pt advised that rx has been sent

## 2015-08-29 NOTE — Telephone Encounter (Signed)
Pt needs refill on his blood pressure medicine call into [facility] [place]  , pt also would like for you to call him back, also he has been out of meds for two days

## 2015-08-31 ENCOUNTER — Telehealth: Payer: Self-pay | Admitting: Cardiology

## 2015-08-31 NOTE — Telephone Encounter (Signed)
Spoke with pt.  Explained we would prefer acetaminophen products to reduce risk of bleeding but if this did not control the pain he could try a dose of advil or motrin but would limit it to as little as possible.  He is agreeable.

## 2015-08-31 NOTE — Telephone Encounter (Signed)
New message      Pt is on coumadin---he had dental work and has 8 stitches in his gum.  What can he take for pain?  Dentist want him to take advil or motrin

## 2015-09-05 ENCOUNTER — Ambulatory Visit (INDEPENDENT_AMBULATORY_CARE_PROVIDER_SITE_OTHER): Payer: BLUE CROSS/BLUE SHIELD | Admitting: Endocrinology

## 2015-09-05 ENCOUNTER — Encounter: Payer: Self-pay | Admitting: Endocrinology

## 2015-09-05 VITALS — BP 142/97 | HR 76 | Temp 98.3°F | Ht 71.5 in | Wt 287.0 lb

## 2015-09-05 DIAGNOSIS — Z125 Encounter for screening for malignant neoplasm of prostate: Secondary | ICD-10-CM

## 2015-09-05 DIAGNOSIS — Z0000 Encounter for general adult medical examination without abnormal findings: Secondary | ICD-10-CM | POA: Diagnosis not present

## 2015-09-05 DIAGNOSIS — E119 Type 2 diabetes mellitus without complications: Secondary | ICD-10-CM

## 2015-09-05 DIAGNOSIS — I10 Essential (primary) hypertension: Secondary | ICD-10-CM | POA: Diagnosis not present

## 2015-09-05 DIAGNOSIS — M109 Gout, unspecified: Secondary | ICD-10-CM | POA: Diagnosis not present

## 2015-09-05 DIAGNOSIS — Z0189 Encounter for other specified special examinations: Secondary | ICD-10-CM

## 2015-09-05 DIAGNOSIS — Z23 Encounter for immunization: Secondary | ICD-10-CM | POA: Diagnosis not present

## 2015-09-05 LAB — POCT GLYCOSYLATED HEMOGLOBIN (HGB A1C): Hemoglobin A1C: 6.9

## 2015-09-05 NOTE — Progress Notes (Signed)
Subjective:    Patient ID: Spencer English, male    DOB: 1966/08/26, 49 y.o.   MRN: [id number]  HPI Pt is here for regular wellness examination, and is feeling pretty well in general, and says chronic med probs are stable, except as noted below. Past Medical History  Diagnosis Date  . DM 08/08/2008  . DYSLIPIDEMIA 04/26/2009  . GOUT 10/24/2010  . HYPERTENSION 07/21/2007  . PULMONARY EMBOLISM 05/03/2009  . DEEP VENOUS THROMBOPHLEBITIS, LEG, LEFT 05/05/2009  . ALLERGIC RHINITIS 04/12/2009  . UNSPECIFIED URINARY CALCULUS 07/04/2010  . 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 ECZEMA 05/23/2010  . Long term (current) use of anticoagulants 04/03/2011  . BACK PAIN, LUMBAR 01/14/2011  . 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 Hyperglycemia   . Leukopenia   . Atrial fibrillation   . GERD (gastroesophageal reflux disease)     Past Surgical History  Procedure Laterality Date  . Anterior cruciate ligament repair  2000    Right  . Knee arthroscopy      left knee  . Knee arthroscopy w/ acl reconstruction      right knee  . Cystectomy      back of head  . Knee arthroscopy  01/03/2013    Procedure: ARTHROSCOPY KNEE;  Surgeon: Yvette Rack., MD;  Location: [facility];  Service: Orthopedics;  Laterality: Left;  Lavage Synovectomy, Removal of loose body    Social History   Social History  . Marital Status: Legally Separated    Spouse Name: N/A  . Number of Children: 3  . Years of Education: N/A   Occupational History  . Truck Geophysicist/field seismologist   .     Social History Main Topics  . Smoking status: Former Smoker    Types: Cigars    Quit date: 01/07/2005  . Smokeless tobacco: Never Used  . Alcohol Use: No  . Drug Use: No  . Sexual Activity: Not on file   Other Topics Concern  . Not on file   Social History Narrative    Current Outpatient Prescriptions on File Prior to Visit  Medication Sig Dispense Refill  . acetaminophen (TYLENOL) 325 MG tablet Take 2 tablets (650 mg total) by mouth every 6 (six) hours as  needed for pain.    Marland Kitchen allopurinol (ZYLOPRIM) 300 MG tablet Take 1 tablet (300 mg total) by mouth at bedtime. For gout flare-up. 30 tablet 0  . fexofenadine (ALLEGRA) 180 MG tablet Take 180 mg by mouth daily as needed. For allergies.    . fluticasone (FLONASE) 50 MCG/ACT nasal spray Place 2 sprays into the nose daily as needed. For allergies. 16 g 11  . furosemide (LASIX) 20 MG tablet TAKE ONE TABLET BY MOUTH ONCE DAILY 30 tablet 1  . halobetasol (ULTRAVATE) 0.05 % cream APPLY CREAM TOPICALLY THREE TIMES DAILY AS NEEDED FOR RASH. 50 g 0  . HYDROcodone-acetaminophen (NORCO/VICODIN) 5-325 MG per tablet Take 1-2 tablets by mouth every 6 (six) hours as needed. 10 tablet 0  . KERYDIN 5 % SOLN Pt has not started taking yet (06/26/14)    . methimazole (TAPAZOLE) 10 MG tablet TAKE 2 TABLETS BY MOUTH 2 TIMES DAILY. 120 tablet 1  . metoprolol tartrate (LOPRESSOR) 25 MG tablet TAKE ONE-HALF TABLET BY MOUTH TWICE DAILY. ONE MONTH REFILL. PATIENT NEEDS APPOINTMENT. 30 tablet 0  . sitaGLIPtin (JANUVIA) 100 MG tablet Take 1 tablet (100 mg total) by mouth daily. 30 tablet 1  . terbinafine (LAMISIL) 250 MG tablet Pt has not started taking yet (06/26/14)    .  verapamil (CALAN-SR) 240 MG CR tablet TAKE ONE TABLET BY MOUTH DAILY 30 tablet 0  . warfarin (COUMADIN) 5 MG tablet Take as directed by Anticoagulation clinic. 60 tablet 0  . atorvastatin (LIPITOR) 20 MG tablet TAKE ONE TABLET BY MOUTH ONCE DAILY (Patient not taking: Reported on 09/05/2015) 30 tablet 0  . bromocriptine (PARLODEL) 2.5 MG tablet Take 0.5 tablets (1.25 mg total) by mouth at bedtime. (Patient not taking: Reported on 09/05/2015) 15 tablet 0   No current facility-administered medications on file prior to visit.    Allergies  Allergen Reactions  . Metformin Diarrhea and Nausea Only  . Viagra [Sildenafil Citrate] Other (See Comments)    headache    Family History  Problem Relation Age of Onset  . Heart disease Neg Hx     no premature CAD known  .  Diabetes Mother   . Hypertension Mother   . Hyperlipidemia Mother   . Diabetes Father   . Hypertension Father   . Hyperlipidemia Father     BP 142/97 mmHg  Pulse 76  Temp(Src) 98.3 F (36.8 C) (Oral)  Ht 5' 11.5" (1.816 m)  Wt 287 lb (130.182 kg)  BMI 39.47 kg/m2  SpO2 97%   Review of Systems  Constitutional: Negative for fever.  HENT: Negative for hearing loss.   Eyes: Negative for visual disturbance.  Respiratory: Negative for shortness of breath.   Cardiovascular: Negative for chest pain.  Gastrointestinal: Negative for blood in stool.  Endocrine: Negative for cold intolerance.  Genitourinary: Negative for hematuria and difficulty urinating.  Musculoskeletal: Negative for back pain.  Skin: Negative for wound.  Allergic/Immunologic: Positive for environmental allergies.  Neurological: Negative for numbness.  Hematological: Does not bruise/bleed easily.  Psychiatric/Behavioral: Negative for dysphoric mood.       Objective:   Physical Exam VS: see vs page GEN: no distress HEAD: head: no deformity eyes: no periorbital swelling, no proptosis external nose and ears are normal mouth: no lesion seen NECK: supple, thyroid is not enlarged CHEST WALL: no deformity LUNGS: clear to auscultation BREASTS:  No gynecomastia CV: reg rate and rhythm, no murmur ABD: abdomen is soft, nontender.  no hepatosplenomegaly.  not distended.  no hernia RECTAL/PROSTATE: declined MUSCULOSKELETAL: muscle bulk and strength are grossly normal.  no obvious joint swelling.  gait is normal and steady PULSES:  no carotid bruit NEURO:  cn 2-12 grossly intact.   readily moves all 4's.  SKIN:  Normal texture and temperature.  No rash or suspicious lesion is visible.   NODES:  None palpable at the neck PSYCH: alert, well-oriented.  Does not appear anxious nor depressed.     Assessment & Plan:  Wellness visit today, with problems stable, except as noted.  Patient is advised the  following: Patient Instructions  please consider these measures for your health:  minimize alcohol.  do not use tobacco products.  have a colonoscopy at least every 10 years from age 27.  keep firearms safely stored.  always use seat belts.  have working smoke alarms in your home.  see an eye doctor and dentist regularly.  never drive under the influence of alcohol or drugs (including prescription drugs).   blood tests are requested for you today.  We'll let you know about the results.  Please come back for a follow-up appointment in 6 months.     SEPARATE EVALUATION FOLLOWS--EACH PROBLEM HERE IS NEW, NOT RESPONDING TO TREATMENT, OR POSES SIGNIFICANT RISK TO THE PATIENT'S HEALTH: HISTORY OF THE PRESENT ILLNESS: Pt  has many yers h/o htn.  He says he takes meds as rx'ed.  Denies weight change. PAST MEDICAL HISTORY reviewed and up to date today REVIEW OF SYSTEMS: denies syncope PHYSICAL EXAMINATION: VITAL SIGNS:  See vs page GENERAL: no distress Ext: 1+ bilat leg edema. LAB/XRAY RESULTS: i personally reviewed electrocardiogram tracing (today): Indication: HTN Impression: T-wave flattening (no change since 2015) Lab Results  Component Value Date   CREATININE 1.2 10/10/2013   BUN 13 10/10/2013   NA 141 10/10/2013   K 4.2 10/10/2013   CL 103 10/10/2013   CO2 29 10/10/2013  IMPRESSION: HTN: he needs increased rx PLAN:  add cozaar Check labs

## 2015-09-05 NOTE — Patient Instructions (Addendum)
please consider these measures for your health:  minimize alcohol.  do not use tobacco products.  have a colonoscopy at least every 10 years from age 50.  keep firearms safely stored.  always use seat belts.  have working smoke alarms in your home.  see an eye doctor and dentist regularly.  never drive under the influence of alcohol or drugs (including prescription drugs).   blood tests are requested for you today.  We'll let you know about the results.   Please come back for a follow-up appointment in 6 months.   

## 2015-09-06 ENCOUNTER — Other Ambulatory Visit: Payer: Self-pay | Admitting: Endocrinology

## 2015-09-06 MED ORDER — LOSARTAN POTASSIUM 50 MG PO TABS: 50.0000 mg | ORAL_TABLET | Freq: Every day | ORAL | Status: DC

## 2015-09-06 NOTE — Telephone Encounter (Signed)
No, it looks like your bp has progressed to the point that you need several meds. However, we can always reduce BP meds if your BP say so. The losartan is similar to benicar, but it is generic.

## 2015-09-06 NOTE — Telephone Encounter (Signed)
please call patient: To follow up from your visit: your bp was high i have sent a prescription to your pharmacy, for an additional BP med Please come back for a blood pressure check in 1 month.

## 2015-09-06 NOTE — Telephone Encounter (Signed)
I contacted the pt and advised of the note below. Pt wanted to know if there is 1 medication he could take for his BP. Pt stated he several years ago he was taking benicar only for his  BP and did not have issues with his bp running high. Pt wanted to know if this would be a option now. Pt is not fond of adding another medication to his daily meds.  Please advise, Thanks!

## 2015-09-07 NOTE — Telephone Encounter (Signed)
Ok, thank you  i'll remove from med list

## 2015-09-07 NOTE — Telephone Encounter (Signed)
I contacted pt and advised of the note below. Pt stated at this time he does not want to start any new bp medication. Pt stated his missed his BP medication 2 days prior to his visit. Pt is scheduled for a BP check on 10/11/2015. Pt states he is going to take his BP medication as prescribed and if his pressure is still high he will consider taking the medication then.

## 2015-09-26 ENCOUNTER — Other Ambulatory Visit: Payer: Self-pay

## 2015-09-26 MED ORDER — METOPROLOL TARTRATE 25 MG PO TABS: ORAL_TABLET | ORAL | Status: DC

## 2015-09-27 MED ORDER — HALOBETASOL PROPIONATE 0.05 % EX CREA: TOPICAL_CREAM | CUTANEOUS | Status: DC

## 2015-09-27 NOTE — Telephone Encounter (Signed)
Patient need refill of his, halobetasol (ULTRAVATE) 0.05 % cream send to  [facility] - [place], [place] - [address] [phone] (Phone) [phone] (Fax)

## 2015-09-27 NOTE — Telephone Encounter (Signed)
See note below and please advise if ok to refill. Last time rx was sent was back in 2014.  Please advise, Thanks!

## 2015-10-09 ENCOUNTER — Other Ambulatory Visit: Payer: Self-pay

## 2015-10-09 MED ORDER — VERAPAMIL HCL ER 240 MG PO TBCR: 240.0000 mg | EXTENDED_RELEASE_TABLET | Freq: Every day | ORAL | Status: DC

## 2015-10-17 ENCOUNTER — Encounter: Payer: Self-pay | Admitting: Endocrinology

## 2015-10-17 ENCOUNTER — Ambulatory Visit (INDEPENDENT_AMBULATORY_CARE_PROVIDER_SITE_OTHER): Payer: BLUE CROSS/BLUE SHIELD | Admitting: Endocrinology

## 2015-10-17 VITALS — BP 116/78 | HR 78 | Temp 98.7°F | Resp 12 | Wt 284.0 lb

## 2015-10-17 DIAGNOSIS — J069 Acute upper respiratory infection, unspecified: Secondary | ICD-10-CM | POA: Diagnosis not present

## 2015-10-17 MED ORDER — METOPROLOL SUCCINATE ER 25 MG PO TB24: 12.5000 mg | ORAL_TABLET | Freq: Every day | ORAL | Status: DC

## 2015-10-17 MED ORDER — AZITHROMYCIN 500 MG PO TABS: 500.0000 mg | ORAL_TABLET | Freq: Every day | ORAL | Status: DC

## 2015-10-17 NOTE — Progress Notes (Signed)
Subjective:    Patient ID: Spencer English, male    DOB: 04/12/1966, 49 y.o.   MRN: [id number]  HPI Pt states few days of slight bilat otalgia, and assoc nasal congestion.  Past Medical History  Diagnosis Date  . DM 08/08/2008  . DYSLIPIDEMIA 04/26/2009  . GOUT 10/24/2010  . HYPERTENSION 07/21/2007  . PULMONARY EMBOLISM 05/03/2009  . DEEP VENOUS THROMBOPHLEBITIS, LEG, LEFT 05/05/2009  . ALLERGIC RHINITIS 04/12/2009  . UNSPECIFIED URINARY CALCULUS 07/04/2010  . 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 ECZEMA 05/23/2010  . Long term (current) use of anticoagulants 04/03/2011  . BACK PAIN, LUMBAR 01/14/2011  . 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 Hyperglycemia   . Leukopenia   . Atrial fibrillation ([place])   . GERD (gastroesophageal reflux disease)     Past Surgical History  Procedure Laterality Date  . Anterior cruciate ligament repair  2000    Right  . Knee arthroscopy      left knee  . Knee arthroscopy w/ acl reconstruction      right knee  . Cystectomy      back of head  . Knee arthroscopy  01/03/2013    Procedure: ARTHROSCOPY KNEE;  Surgeon: Yvette Rack., MD;  Location: [facility];  Service: Orthopedics;  Laterality: Left;  Lavage Synovectomy, Removal of loose body    Social History   Social History  . Marital Status: Legally Separated    Spouse Name: N/A  . Number of Children: 3  . Years of Education: N/A   Occupational History  . Truck Geophysicist/field seismologist   .     Social History Main Topics  . Smoking status: Former Smoker    Types: Cigars    Quit date: 01/07/2005  . Smokeless tobacco: Never Used  . Alcohol Use: No  . Drug Use: No  . Sexual Activity: Not on file   Other Topics Concern  . Not on file   Social History Narrative    Current Outpatient Prescriptions on File Prior to Visit  Medication Sig Dispense Refill  . acetaminophen (TYLENOL) 325 MG tablet Take 2 tablets (650 mg total) by mouth every 6 (six) hours as needed for pain.    Marland Kitchen allopurinol (ZYLOPRIM) 300 MG tablet Take 1  tablet (300 mg total) by mouth at bedtime. For gout flare-up. 30 tablet 0  . atorvastatin (LIPITOR) 20 MG tablet TAKE ONE TABLET BY MOUTH ONCE DAILY 30 tablet 0  . bromocriptine (PARLODEL) 2.5 MG tablet Take 0.5 tablets (1.25 mg total) by mouth at bedtime. 15 tablet 0  . fexofenadine (ALLEGRA) 180 MG tablet Take 180 mg by mouth daily as needed. For allergies.    . fluticasone (FLONASE) 50 MCG/ACT nasal spray Place 2 sprays into the nose daily as needed. For allergies. 16 g 11  . furosemide (LASIX) 20 MG tablet TAKE ONE TABLET BY MOUTH ONCE DAILY 30 tablet 1  . halobetasol (ULTRAVATE) 0.05 % cream APPLY CREAM TOPICALLY THREE TIMES DAILY AS NEEDED FOR RASH. 50 g 0  . HYDROcodone-acetaminophen (NORCO/VICODIN) 5-325 MG per tablet Take 1-2 tablets by mouth every 6 (six) hours as needed. 10 tablet 0  . KERYDIN 5 % SOLN Pt has not started taking yet (06/26/14)    . methimazole (TAPAZOLE) 10 MG tablet TAKE 2 TABLETS BY MOUTH 2 TIMES DAILY. 120 tablet 1  . sitaGLIPtin (JANUVIA) 100 MG tablet Take 1 tablet (100 mg total) by mouth daily. 30 tablet 1  . terbinafine (LAMISIL) 250 MG tablet Pt has not started taking yet (  06/26/14)    . verapamil (CALAN-SR) 240 MG CR tablet Take 1 tablet (240 mg total) by mouth daily. 30 tablet 6  . warfarin (COUMADIN) 5 MG tablet Take as directed by Anticoagulation clinic. 60 tablet 0   No current facility-administered medications on file prior to visit.    Allergies  Allergen Reactions  . Metformin Diarrhea and Nausea Only  . Viagra [Sildenafil Citrate] Other (See Comments)    headache    Family History  Problem Relation Age of Onset  . Heart disease Neg Hx     no premature CAD known  . Diabetes Mother   . Hypertension Mother   . Hyperlipidemia Mother   . Diabetes Father   . Hypertension Father   . Hyperlipidemia Father     BP 116/78 mmHg  Pulse 78  Temp(Src) 98.7 F (37.1 C) (Oral)  Resp 12  Wt 284 lb (128.822 kg)  SpO2 96%  Review of Systems He has  prod cough, but no sob or fever.  He says metoprolol causes ED sxs.     Objective:   Physical Exam VITAL SIGNS:  See vs page GENERAL: no distress head: no deformity eyes: no periorbital swelling, no proptosis external nose and ears are normal mouth: no lesion seen Both tm's are red LUNGS:  Clear to auscultation       Assessment & Plan:  URI: new HTN: well-controlled ED, possibly due to metoprolol   Patient is advised the following: Patient Instructions  i have sent 2 prescriptions to your pharmacy: antibiotic, and to reduce the metoprolol.  I hope you feel better soon.  If you don't feel better by next week, please call back.  Please call sooner if you get worse.  Please come in soon, to do your blood tests.

## 2015-10-17 NOTE — Patient Instructions (Signed)
i have sent 2 prescriptions to your pharmacy: antibiotic, and to reduce the metoprolol.  I hope you feel better soon.  If you don't feel better by next week, please call back.  Please call sooner if you get worse.  Please come in soon, to do your blood tests.

## 2015-10-18 ENCOUNTER — Telehealth: Payer: Self-pay | Admitting: Endocrinology

## 2015-10-18 NOTE — Telephone Encounter (Signed)
Please see below.

## 2015-10-18 NOTE — Telephone Encounter (Signed)
Pt came in yesterday due to a sinus infection, he is feeling terrible today and is asking if we can write a letter excusing him from work today and he can come pick it up if you approve

## 2015-10-18 NOTE — Telephone Encounter (Signed)
i did letter 

## 2015-10-19 ENCOUNTER — Other Ambulatory Visit: Payer: Self-pay | Admitting: Endocrinology

## 2015-10-19 ENCOUNTER — Other Ambulatory Visit: Payer: BLUE CROSS/BLUE SHIELD

## 2015-10-19 ENCOUNTER — Ambulatory Visit (INDEPENDENT_AMBULATORY_CARE_PROVIDER_SITE_OTHER): Payer: BLUE CROSS/BLUE SHIELD | Admitting: *Deleted

## 2015-10-19 DIAGNOSIS — I82402 Acute embolism and thrombosis of unspecified deep veins of left lower extremity: Secondary | ICD-10-CM

## 2015-10-19 DIAGNOSIS — Z7901 Long term (current) use of anticoagulants: Secondary | ICD-10-CM | POA: Diagnosis not present

## 2015-10-19 DIAGNOSIS — I2782 Chronic pulmonary embolism: Secondary | ICD-10-CM

## 2015-10-19 DIAGNOSIS — M109 Gout, unspecified: Secondary | ICD-10-CM

## 2015-10-19 DIAGNOSIS — E119 Type 2 diabetes mellitus without complications: Secondary | ICD-10-CM

## 2015-10-19 DIAGNOSIS — Z125 Encounter for screening for malignant neoplasm of prostate: Secondary | ICD-10-CM

## 2015-10-19 DIAGNOSIS — Z0000 Encounter for general adult medical examination without abnormal findings: Secondary | ICD-10-CM

## 2015-10-19 DIAGNOSIS — I10 Essential (primary) hypertension: Secondary | ICD-10-CM

## 2015-10-19 LAB — CBC WITH DIFFERENTIAL/PLATELET
BASOS ABS: 0 10*3/uL (ref 0.0–0.1)
Basophils Relative: 0.7 % (ref 0.0–3.0)
EOS ABS: 0.4 10*3/uL (ref 0.0–0.7)
Eosinophils Relative: 6 % — ABNORMAL HIGH (ref 0.0–5.0)
HCT: 46.9 % (ref 39.0–52.0)
Hemoglobin: 14.8 g/dL (ref 13.0–17.0)
LYMPHS ABS: 1.4 10*3/uL (ref 0.7–4.0)
Lymphocytes Relative: 19 % (ref 12.0–46.0)
MCHC: 31.6 g/dL (ref 30.0–36.0)
MCV: 84.2 fl (ref 78.0–100.0)
MONO ABS: 0.8 10*3/uL (ref 0.1–1.0)
Monocytes Relative: 11.5 % (ref 3.0–12.0)
NEUTROS ABS: 4.6 10*3/uL (ref 1.4–7.7)
NEUTROS PCT: 62.8 % (ref 43.0–77.0)
PLATELETS: 239 10*3/uL (ref 150.0–400.0)
RBC: 5.57 Mil/uL (ref 4.22–5.81)
RDW: 18.1 % — ABNORMAL HIGH (ref 11.5–15.5)
WBC: 7.3 10*3/uL (ref 4.0–10.5)

## 2015-10-19 LAB — URINALYSIS, ROUTINE W REFLEX MICROSCOPIC
BILIRUBIN URINE: NEGATIVE
Hgb urine dipstick: NEGATIVE
KETONES UR: NEGATIVE
Leukocytes, UA: NEGATIVE
Nitrite: NEGATIVE
RBC / HPF: NONE SEEN (ref 0–?)
Renal Epithel, UA: NONE SEEN
Specific Gravity, Urine: 1.025 (ref 1.000–1.030)
UROBILINOGEN UA: 1 (ref 0.0–1.0)
Urine Glucose: NEGATIVE
pH: 6 (ref 5.0–8.0)

## 2015-10-19 LAB — LIPID PANEL
Cholesterol: 220 mg/dL — ABNORMAL HIGH (ref 0–200)
HDL: 46.9 mg/dL (ref >39.00)
LDL Cholesterol: 161 mg/dL — ABNORMAL HIGH (ref 0–99)
NonHDL: 173.46
TRIGLYCERIDES: 60 mg/dL (ref 0.0–149.0)
Total CHOL/HDL Ratio: 5
VLDL: 12 mg/dL (ref 0.0–40.0)

## 2015-10-19 LAB — POCT INR: INR: 2.6

## 2015-10-19 LAB — BASIC METABOLIC PANEL
BUN: 8 mg/dL (ref 6–23)
CO2: 29 mEq/L (ref 19–32)
CREATININE: 1.21 mg/dL (ref 0.40–1.50)
Calcium: 9.1 mg/dL (ref 8.4–10.5)
Chloride: 105 mEq/L (ref 96–112)
GFR: 81.54 mL/min (ref >60.00)
Glucose, Bld: 137 mg/dL — ABNORMAL HIGH (ref 70–99)
Potassium: 4.4 mEq/L (ref 3.5–5.1)
Sodium: 141 mEq/L (ref 135–145)

## 2015-10-19 LAB — MICROALBUMIN / CREATININE URINE RATIO
Creatinine,U: 456.5 mg/dL
MICROALB UR: 7 mg/dL — AB (ref 0.0–1.9)
MICROALB/CREAT RATIO: 1.5 mg/g (ref 0.0–30.0)

## 2015-10-19 LAB — TSH: TSH: 1.76 u[IU]/mL (ref 0.35–4.50)

## 2015-10-19 LAB — URIC ACID: Uric Acid, Serum: 6.9 mg/dL (ref 4.0–7.8)

## 2015-10-19 LAB — PSA: PSA: 0.98 ng/mL (ref 0.10–4.00)

## 2015-10-19 MED ORDER — ATORVASTATIN CALCIUM 20 MG PO TABS: 20.0000 mg | ORAL_TABLET | Freq: Every day | ORAL | Status: DC

## 2015-10-19 MED ORDER — WARFARIN SODIUM 5 MG PO TABS: ORAL_TABLET | ORAL | Status: DC

## 2015-11-04 NOTE — Progress Notes (Signed)
   Subjective:    Patient ID: Spencer English, male    DOB: Jun 30, 1966, 49 y.o.   MRN: [id number]  HPI Letter only   Review of Systems     Objective:   Physical Exam        Assessment & Plan:

## 2015-11-07 ENCOUNTER — Telehealth: Payer: Self-pay | Admitting: Endocrinology

## 2015-11-07 NOTE — Telephone Encounter (Signed)
See note below and please advise, Thanks! 

## 2015-11-07 NOTE — Telephone Encounter (Signed)
This won't work against the other meds

## 2015-11-07 NOTE — Telephone Encounter (Signed)
Patient ask if supplement L Arginine good to take with his medications, please advise

## 2015-11-07 NOTE — Telephone Encounter (Signed)
Left voicemail advising of note below. Requested call back if the pt would like to discuss.  

## 2015-11-12 ENCOUNTER — Telehealth: Payer: Self-pay | Admitting: Endocrinology

## 2015-11-12 MED ORDER — ALLOPURINOL 300 MG PO TABS: 300.0000 mg | ORAL_TABLET | Freq: Every day | ORAL | Status: DC

## 2015-11-12 NOTE — Telephone Encounter (Signed)
Need refill of medication send to allopurinol (ZYLOPRIM) 300 MG tablet [facility] [address]

## 2015-11-12 NOTE — Telephone Encounter (Signed)
Rx sent per pt's request.  

## 2015-12-03 ENCOUNTER — Other Ambulatory Visit: Payer: Self-pay | Admitting: *Deleted

## 2015-12-03 MED ORDER — WARFARIN SODIUM 5 MG PO TABS: ORAL_TABLET | ORAL | Status: DC

## 2015-12-05 ENCOUNTER — Other Ambulatory Visit: Payer: Self-pay | Admitting: Endocrinology

## 2016-01-22 ENCOUNTER — Telehealth: Payer: Self-pay | Admitting: Pharmacist

## 2016-01-22 ENCOUNTER — Telehealth: Payer: Self-pay | Admitting: Internal Medicine

## 2016-01-22 NOTE — Telephone Encounter (Signed)
Patient called requesting coumadin and clearance from Dr. Rayann Heman for tooth extraction date TBD. Per Jinny Blossom, [facility] phone note, offered patient Coumadin appointment this Friday so extraction could be done early next week.  Patient declined appointment stating he will have to ask his boss first. Patient to call tomorrow and scheduled Coumadin check.

## 2016-01-22 NOTE — Telephone Encounter (Signed)
Received a fax from from Dr. Feliberto Harts DDS with [facility]. He is having surgical removal of a molar (date not scheduled yet - at the earliest, procedure will be 1/30). Called and spoke with Jerline Pain in his office since per our protocol, we do not hold Coumadin for 1 dental extraction. Dr. Romie Minus is ok with doing procedure as long as INR is 2.5 or less, checked at least a few days before the procedure. Earliest he can be seen by them is Monday 1/30 - will recheck INR in clinic on Friday 1/27 and fax result to dental office fax [phone].   Clearance form faxed to dental office on 01/22/16.  Left message to schedule pt for Coumadin clinic on Friday..he has a history of missing appointments and has not been seen in 3 months by our clinic.

## 2016-01-22 NOTE — Telephone Encounter (Signed)
Please see other phone note from 1/24 - we received clearance form in Coumadin clinic as well. Since we don't typically hold Coumadin for 1 extraction, called office to confirm. They will do extraction with INR of 2.5 or less. Pt needs check done at least a few days before procedure. Attempting to recheck on Friday 1/27 - will await pt's call tomorrow. Of note, he has a history of missing appointments and has not been seen by [place] for 3 months.

## 2016-01-22 NOTE — Telephone Encounter (Signed)
Request for surgical clearance:  1. What type of surgery is being performed? Tooth extraction    2. When is this surgery scheduled? Not scheduled yet    3. Are there any medications that need to be held prior to surgery and how long? Coumadin .  pt not sure how long   4. Name of physician performing surgery?  Dr. Romie Minus    5. What is your office phone and fax number? Office Number: [phone] Fax # ?    Comments: Pt called states that the office of Dr. Romie Minus has suggested that he calls to req surgical clearance as well. Per pt a fax was sent today and he would like to expedite the process. Please assist

## 2016-01-25 NOTE — Telephone Encounter (Signed)
Have left 2 messages for patient to call and schedule Coumadin check that is supposed to be today so that he can have a tooth extracted. Have not heard back from pt yet.

## 2016-01-28 NOTE — Telephone Encounter (Signed)
Called dentist office to see if they had scheduled pt's extraction yet since we have not heard from patient to set up Coumadin check despite leaving 2 messages. Dr. Romie Minus is out of town until 2/17, so pt will likely not have extraction prior to that date. If he does need it removed before then, there is another dentist in the office who could do the extraction. Informed them that if this occurs, to give [place] a call with the extraction date so that we can attempt to reach out to the pt again to get his Coumadin checked.

## 2016-02-06 ENCOUNTER — Other Ambulatory Visit: Payer: Self-pay | Admitting: Endocrinology

## 2016-02-06 ENCOUNTER — Telehealth: Payer: Self-pay | Admitting: Endocrinology

## 2016-02-06 MED ORDER — METHIMAZOLE 10 MG PO TABS: ORAL_TABLET | ORAL | Status: DC

## 2016-02-06 NOTE — Telephone Encounter (Signed)
Methimazole 10 mg refills needed please call into [facility] on [address]

## 2016-02-06 NOTE — Telephone Encounter (Signed)
Rx submitted

## 2016-02-27 ENCOUNTER — Ambulatory Visit (INDEPENDENT_AMBULATORY_CARE_PROVIDER_SITE_OTHER): Payer: BLUE CROSS/BLUE SHIELD | Admitting: *Deleted

## 2016-02-27 DIAGNOSIS — Z7901 Long term (current) use of anticoagulants: Secondary | ICD-10-CM

## 2016-02-27 DIAGNOSIS — I2782 Chronic pulmonary embolism: Secondary | ICD-10-CM

## 2016-02-27 DIAGNOSIS — I82402 Acute embolism and thrombosis of unspecified deep veins of left lower extremity: Secondary | ICD-10-CM | POA: Diagnosis not present

## 2016-02-27 LAB — POCT INR: INR: 2.5

## 2016-02-28 ENCOUNTER — Telehealth: Payer: Self-pay | Admitting: Endocrinology

## 2016-02-28 NOTE — Telephone Encounter (Signed)
Pt wants to know if him taking Biotin with the multi vitamin he is already taking will effect his diabetes.

## 2016-02-28 NOTE — Telephone Encounter (Signed)
See note below and please advise, Thanks! 

## 2016-02-28 NOTE — Telephone Encounter (Signed)
It is ok

## 2016-02-29 NOTE — Telephone Encounter (Signed)
Pt advised via voicemail it is ok to take a multivitamin and biotin.

## 2016-03-04 ENCOUNTER — Ambulatory Visit: Payer: BLUE CROSS/BLUE SHIELD | Admitting: Endocrinology

## 2016-03-16 ENCOUNTER — Other Ambulatory Visit: Payer: Self-pay | Admitting: Endocrinology

## 2016-03-17 ENCOUNTER — Telehealth: Payer: Self-pay | Admitting: Endocrinology

## 2016-03-17 MED ORDER — METOPROLOL SUCCINATE ER 25 MG PO TB24: 12.5000 mg | ORAL_TABLET | Freq: Every day | ORAL | Status: DC

## 2016-03-17 MED ORDER — VERAPAMIL HCL ER 240 MG PO TBCR: 240.0000 mg | EXTENDED_RELEASE_TABLET | Freq: Every day | ORAL | Status: DC

## 2016-03-17 MED ORDER — FUROSEMIDE 20 MG PO TABS
20.0000 mg | ORAL_TABLET | Freq: Every day | ORAL | Status: DC
Start: 2016-03-17 — End: 2016-03-28

## 2016-03-17 NOTE — Telephone Encounter (Signed)
Rx submitter per pt's request.

## 2016-03-17 NOTE — Telephone Encounter (Signed)
ferosimide and his blood pressure med needs to be called into [facility] on [address]

## 2016-03-27 ENCOUNTER — Ambulatory Visit: Payer: BLUE CROSS/BLUE SHIELD | Admitting: Pulmonary Disease

## 2016-03-28 ENCOUNTER — Telehealth: Payer: Self-pay | Admitting: Endocrinology

## 2016-03-28 MED ORDER — SITAGLIPTIN PHOSPHATE 100 MG PO TABS: 100.0000 mg | ORAL_TABLET | Freq: Every day | ORAL | Status: DC

## 2016-03-28 MED ORDER — FUROSEMIDE 20 MG PO TABS
20.0000 mg | ORAL_TABLET | Freq: Every day | ORAL | Status: DC
Start: 2016-03-28 — End: 2016-09-04

## 2016-03-28 NOTE — Telephone Encounter (Signed)
Pt needs refill on furosemide and januvia called into [facility] on [address]

## 2016-03-28 NOTE — Telephone Encounter (Signed)
Rx submitted per pt's request.  

## 2016-04-09 DIAGNOSIS — G4733 Obstructive sleep apnea (adult) (pediatric): Secondary | ICD-10-CM | POA: Diagnosis not present

## 2016-05-09 DIAGNOSIS — G4733 Obstructive sleep apnea (adult) (pediatric): Secondary | ICD-10-CM | POA: Diagnosis not present

## 2016-05-19 ENCOUNTER — Telehealth: Payer: Self-pay | Admitting: Endocrinology

## 2016-05-19 NOTE — Telephone Encounter (Signed)
error 

## 2016-05-22 ENCOUNTER — Telehealth: Payer: Self-pay | Admitting: Endocrinology

## 2016-05-22 NOTE — Telephone Encounter (Signed)
PT said he feels he needs an ultrasound done, he said he has a knot on his chest.  He would like a call back.

## 2016-05-22 NOTE — Telephone Encounter (Signed)
I contacted the pt and advised he would need to come in for an appointment before ordering the [place]. Pt scheduled for 05/30/2016 at 830 am.

## 2016-05-28 ENCOUNTER — Encounter: Payer: BLUE CROSS/BLUE SHIELD | Admitting: Physician Assistant

## 2016-05-28 NOTE — Progress Notes (Signed)
Cardiology Office Note Date:  05/28/2016  Patient ID:  Spencer English, DOB February 24, 1966, MRN [id number] PCP:  Renato Shin, MD  Cardiologist:  Dr. Martinique Electrophysiologist: Dr. Rayann Heman  **refresh   Chief Complaint:  Overdue visit  History of Present Illness: Spencer English is a 50 y.o. male with history of PAFib, flutter in setting of hyperthyroidism, DM, HLD, gout, PE in 2010, and HTN.  He comes today to be seen for Dr. Rayann Heman, last seen by him Jun 2015 at that visit the patient reported his hyperthyroidism treated well  With resolved palpitations and planned to wean off BB with c/o ED with it.  He comes today feeling **   ** bleeding, warfarin, hx of PE's as well, manages? ** repeat echo?  manages lipids?   Past Medical History  Diagnosis Date  . DM 08/08/2008  . DYSLIPIDEMIA 04/26/2009  . GOUT 10/24/2010  . HYPERTENSION 07/21/2007  . PULMONARY EMBOLISM 05/03/2009  . DEEP VENOUS THROMBOPHLEBITIS, LEG, LEFT 05/05/2009  . ALLERGIC RHINITIS 04/12/2009  . UNSPECIFIED URINARY CALCULUS 07/04/2010  . 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 ECZEMA 05/23/2010  . Long term (current) use of anticoagulants 04/03/2011  . BACK PAIN, LUMBAR 01/14/2011  . 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 Hyperglycemia   . Leukopenia   . Atrial fibrillation ([place])   . GERD (gastroesophageal reflux disease)     Past Surgical History  Procedure Laterality Date  . Anterior cruciate ligament repair  2000    Right  . Knee arthroscopy      left knee  . Knee arthroscopy w/ acl reconstruction      right knee  . Cystectomy      back of head  . Knee arthroscopy  01/03/2013    Procedure: ARTHROSCOPY KNEE;  Surgeon: Yvette Rack., MD;  Location: [facility];  Service: Orthopedics;  Laterality: Left;  Lavage Synovectomy, Removal of loose body    Current Outpatient Prescriptions  Medication Sig Dispense Refill  . acetaminophen (TYLENOL) 325 MG tablet Take 2 tablets (650 mg total) by mouth every 6 (six) hours as needed for pain.     Marland Kitchen allopurinol (ZYLOPRIM) 300 MG tablet Take 1 tablet (300 mg total) by mouth at bedtime. For gout flare-up. 30 tablet 5  . atorvastatin (LIPITOR) 20 MG tablet Take 1 tablet (20 mg total) by mouth daily. 90 tablet 3  . azithromycin (ZITHROMAX) 500 MG tablet Take 1 tablet (500 mg total) by mouth daily. 5 tablet 0  . bromocriptine (PARLODEL) 2.5 MG tablet Take 0.5 tablets (1.25 mg total) by mouth at bedtime. 15 tablet 0  . fexofenadine (ALLEGRA) 180 MG tablet Take 180 mg by mouth daily as needed. For allergies.    . fluticasone (FLONASE) 50 MCG/ACT nasal spray Place 2 sprays into the nose daily as needed. For allergies. 16 g 11  . furosemide (LASIX) 20 MG tablet Take 1 tablet (20 mg total) by mouth daily. 30 tablet 4  . halobetasol (ULTRAVATE) 0.05 % cream APPLY CREAM TOPICALLY THREE TIMES DAILY AS NEEDED FOR RASH. 50 g 0  . HYDROcodone-acetaminophen (NORCO/VICODIN) 5-325 MG per tablet Take 1-2 tablets by mouth every 6 (six) hours as needed. 10 tablet 0  . KERYDIN 5 % SOLN Pt has not started taking yet (06/26/14)    . methimazole (TAPAZOLE) 10 MG tablet TAKE 2 TABLETS BY MOUTH 2 TIMES DAILY. 120 tablet 1  . metoprolol succinate (TOPROL-XL) 25 MG 24 hr tablet Take 0.5 tablets (12.5 mg total) by mouth daily. 15 tablet 4  .  sitaGLIPtin (JANUVIA) 100 MG tablet Take 1 tablet (100 mg total) by mouth daily. 30 tablet 4  . terbinafine (LAMISIL) 250 MG tablet Pt has not started taking yet (06/26/14)    . verapamil (CALAN-SR) 240 MG CR tablet Take 1 tablet (240 mg total) by mouth daily. 30 tablet 4  . warfarin (COUMADIN) 5 MG tablet Take as directed by Anticoagulation clinic. 60 tablet 1   No current facility-administered medications for this visit.    Allergies:   Metformin and Viagra   Social History:  The patient  reports that he quit smoking about 11 years ago. His smoking use included Cigars. He has never used smokeless tobacco. He reports that he does not drink alcohol or use illicit drugs.    Family History:  The patient's family history includes Diabetes in his father and mother; Hyperlipidemia in his father and mother; Hypertension in his father and mother. There is no history of Heart disease.  ROS:  Please see the history of present illness.    All other systems are reviewed and otherwise negative.   PHYSICAL EXAM: ** VS:  There were no vitals taken for this visit. BMI: There is no weight on file to calculate BMI. Well nourished, well developed, in no acute distress HEENT: normocephalic, atraumatic Neck: no JVD, carotid bruits or masses Cardiac:  normal S1, S2; RRR; no significant murmurs, no rubs, or gallops Lungs:  clear to auscultation bilaterally, no wheezing, rhonchi or rales Abd: soft, nontender,  + BS MS: no deformity or atrophy Ext: no edema Skin: warm and dry, no rash Neuro:  No gross deficits appreciated Psych: euthymic mood, full affect   EKG:  Done today shows **  01/24/13: Echocardiogram Study Conclusions - Left ventricle: The cavity size was moderately dilated. Wall thickness was normal. Systolic function was normal. The estimated ejection fraction was in the range of 50% to 55%. - Mitral valve: Moderate regurgitation. - Left atrium: The atrium was moderately dilated. - Right ventricle: The cavity size was moderately dilated. Systolic function was mildly reduced. - Right atrium: The atrium was moderately dilated. - Tricuspid valve: Moderate regurgitation. - Pulmonary arteries: Systolic pressure was mildly to moderately increased. PA peak pressure: 82mm Hg (S).  Recent Labs: 10/19/2015: BUN 8; Creatinine, Ser 1.21; Hemoglobin 14.8; Platelets 239.0; Potassium 4.4; Sodium 141; TSH 1.76  10/19/2015: Cholesterol 220*; HDL 46.90; LDL Cholesterol 161*; Total CHOL/HDL Ratio 5; Triglycerides 60.0; VLDL 12.0   CrCl cannot be calculated (Unknown ideal weight.).   Wt Readings from Last 3 Encounters:  10/19/15 283 lb 4 oz (128.481 kg)   10/17/15 284 lb (128.822 kg)  09/05/15 287 lb (130.182 kg)     Other studies reviewed: Additional studies/records reviewed today include: summarized above**  ASSESSMENT AND PLAN:  ** Coumadin or OAC Labs, CHADS  1. PAFib/flutter    CHADS2Vasc is at least 2 on warfarin, monitored by the coumadin clinic    Oct 2016 H/H 14/46  2. HTN     **  3. Mod MR  4. Pul HTN on his last echo     Hx of PE 2010     OSA  5. OSA     last visit with pulm (2015) mentions compliance with CPAP  7. HLD     Oct 2016 LDL 161     On statin  Disposition: F/u with **  Current medicines are reviewed at length with the patient today.  The patient did not have any concerns regarding medicines.**  Signed, Jennings Books,  PA-C 05/28/2016 6:50 AM     [facility] [address] [place] [address] [phone] (office)  [phone] (fax)    This encounter was created in error - please disregard.

## 2016-05-30 ENCOUNTER — Ambulatory Visit: Payer: BLUE CROSS/BLUE SHIELD | Admitting: Endocrinology

## 2016-05-30 NOTE — Progress Notes (Signed)
This encounter was created in error - please disregard.

## 2016-05-30 NOTE — Progress Notes (Signed)
Patient was not seen and only needed a note. This was made in error.

## 2016-06-04 ENCOUNTER — Ambulatory Visit: Payer: BLUE CROSS/BLUE SHIELD | Admitting: Endocrinology

## 2016-06-04 DIAGNOSIS — Z0289 Encounter for other administrative examinations: Secondary | ICD-10-CM

## 2016-06-09 DIAGNOSIS — G4733 Obstructive sleep apnea (adult) (pediatric): Secondary | ICD-10-CM | POA: Diagnosis not present

## 2016-06-16 ENCOUNTER — Ambulatory Visit (INDEPENDENT_AMBULATORY_CARE_PROVIDER_SITE_OTHER): Payer: BLUE CROSS/BLUE SHIELD | Admitting: *Deleted

## 2016-06-16 ENCOUNTER — Ambulatory Visit (INDEPENDENT_AMBULATORY_CARE_PROVIDER_SITE_OTHER): Payer: BLUE CROSS/BLUE SHIELD | Admitting: Physician Assistant

## 2016-06-16 ENCOUNTER — Encounter: Payer: Self-pay | Admitting: Physician Assistant

## 2016-06-16 VITALS — BP 146/98 | HR 81 | Ht 71.0 in | Wt 292.0 lb

## 2016-06-16 DIAGNOSIS — I2782 Chronic pulmonary embolism: Secondary | ICD-10-CM | POA: Diagnosis not present

## 2016-06-16 DIAGNOSIS — R079 Chest pain, unspecified: Secondary | ICD-10-CM

## 2016-06-16 DIAGNOSIS — I480 Paroxysmal atrial fibrillation: Secondary | ICD-10-CM | POA: Diagnosis not present

## 2016-06-16 DIAGNOSIS — I340 Nonrheumatic mitral (valve) insufficiency: Secondary | ICD-10-CM | POA: Diagnosis not present

## 2016-06-16 DIAGNOSIS — Z7901 Long term (current) use of anticoagulants: Secondary | ICD-10-CM

## 2016-06-16 DIAGNOSIS — I82402 Acute embolism and thrombosis of unspecified deep veins of left lower extremity: Secondary | ICD-10-CM

## 2016-06-16 DIAGNOSIS — I10 Essential (primary) hypertension: Secondary | ICD-10-CM | POA: Diagnosis not present

## 2016-06-16 LAB — POCT INR: INR: 2.2

## 2016-06-16 NOTE — Progress Notes (Signed)
Cardiology Office Note Date:  06/16/2016  Patient ID:  Spencer English, DOB Oct 05, 1966, MRN [id number] PCP:  Renato Shin, MD  Electrophysiologist: Dr. Rayann Heman   Chief Complaint: over due visit  History of Present Illness: Spencer English is a 50 y.o. male with history of PAFib/flutter in setting of hypothyroidism, felt to be resolved with treatment of his thyroid at his last visit with Dr. Rayann Heman in June 2015, HTN, HLD, hx of DVT/PE in 2010, DM, OSA w/ CPAP  He comes in today being seen for Dr. Rayann Heman, last seen by him Jun 2015, at that time with resolution of his Af symptoms with treatment of histhyroid, and c/o ED thought secondary to the BB, this was started to be down-titrated and to f/u, though I do not see a visit after that.    He is feeling well, but mentions in the last couple months that he had been getting an aching type chest discomfort, mostly at night somewhat random, non-radiating, lasting about a minute with no associated symptoms.  Thinks he may have had it a couple times during the day, but more noted evenings, in bed.  No SOB, no symptoms of PND or orthopnea.  He denies any palpitations, no dizziness, near syncope or syncope.  He denies any bleeding or signs of bleeding.  He inquires about holding his coumadin for a tattoo and is recommended not to pursue it.   Past Medical History  Diagnosis Date  . DM 08/08/2008  . DYSLIPIDEMIA 04/26/2009  . GOUT 10/24/2010  . HYPERTENSION 07/21/2007  . PULMONARY EMBOLISM 05/03/2009  . DEEP VENOUS THROMBOPHLEBITIS, LEG, LEFT 05/05/2009  . ALLERGIC RHINITIS 04/12/2009  . UNSPECIFIED URINARY CALCULUS 07/04/2010  . 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 ECZEMA 05/23/2010  . Long term (current) use of anticoagulants 04/03/2011  . BACK PAIN, LUMBAR 01/14/2011  . 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 Hyperglycemia   . Leukopenia   . Atrial fibrillation ([place])   . GERD (gastroesophageal reflux disease)     Past Surgical History  Procedure Laterality Date    . Anterior cruciate ligament repair  2000    Right  . Knee arthroscopy      left knee  . Knee arthroscopy w/ acl reconstruction      right knee  . Cystectomy      back of head  . Knee arthroscopy  01/03/2013    Procedure: ARTHROSCOPY KNEE;  Surgeon: Yvette Rack., MD;  Location: [facility];  Service: Orthopedics;  Laterality: Left;  Lavage Synovectomy, Removal of loose body    Current Outpatient Prescriptions  Medication Sig Dispense Refill  . acetaminophen (TYLENOL) 325 MG tablet Take 2 tablets (650 mg total) by mouth every 6 (six) hours as needed for pain.    Marland Kitchen allopurinol (ZYLOPRIM) 300 MG tablet Take 1 tablet (300 mg total) by mouth at bedtime. For gout flare-up. (Patient taking differently: Take 300 mg by mouth daily as needed. For gout flare-up.) 30 tablet 5  . atorvastatin (LIPITOR) 20 MG tablet Take 1 tablet (20 mg total) by mouth daily. 90 tablet 3  . fexofenadine (ALLEGRA) 180 MG tablet Take 180 mg by mouth daily as needed. For allergies.    . fluticasone (FLONASE) 50 MCG/ACT nasal spray Place 2 sprays into the nose daily as needed. For allergies. 16 g 11  . furosemide (LASIX) 20 MG tablet Take 1 tablet (20 mg total) by mouth daily. 30 tablet 4  . halobetasol (ULTRAVATE) 0.05 % cream APPLY CREAM TOPICALLY THREE TIMES DAILY  AS NEEDED FOR RASH. 50 g 0  . KERYDIN 5 % SOLN Pt has not started taking yet (06/26/14)    . methimazole (TAPAZOLE) 10 MG tablet TAKE 2 TABLETS BY MOUTH 2 TIMES DAILY. 120 tablet 1  . metoprolol succinate (TOPROL-XL) 25 MG 24 hr tablet Take 0.5 tablets (12.5 mg total) by mouth daily. 15 tablet 4  . sitaGLIPtin (JANUVIA) 100 MG tablet Take 1 tablet (100 mg total) by mouth daily. 30 tablet 4  . terbinafine (LAMISIL) 250 MG tablet Pt has not started taking yet (06/26/14)    . verapamil (CALAN-SR) 240 MG CR tablet Take 1 tablet (240 mg total) by mouth daily. 30 tablet 4  . warfarin (COUMADIN) 5 MG tablet Take as directed by Anticoagulation clinic. 60 tablet 1   No  current facility-administered medications for this visit.    Allergies:   Metformin and Viagra   Social History:  The patient  reports that he quit smoking about 11 years ago. His smoking use included Cigars. He has never used smokeless tobacco. He reports that he does not drink alcohol or use illicit drugs.   Family History:  The patient's family history includes Diabetes in his father and mother; Heart attack in his father; Heart disease in his father; Hyperlipidemia in his father and mother; Hypertension in his father and mother.  ROS:  Please see the history of present illness.   All other systems are reviewed and otherwise negative.   PHYSICAL EXAM:  VS:  BP 146/98 mmHg  Pulse 81  Ht 5\' 11"  (1.803 m)  Wt 292 lb (132.45 kg)  BMI 40.74 kg/m2 BMI: Body mass index is 40.74 kg/(m^2). Well nourished, well developed, obese, in no acute distress HEENT: normocephalic, atraumatic Neck: no JVD, carotid bruits or masses Cardiac:  normal S1, S2; RRR; no significant murmurs, no rubs, or gallops Lungs:  clear to auscultation bilaterally, no wheezing, rhonchi or rales Abd: soft, nontender MS: no deformity or atrophy Ext: no edema Skin: warm and dry, no rash Neuro:  No gross deficits appreciated Psych: euthymic mood, full affect   EKG:  Done today shows SR, , no ischemic looking changes  01/24/13: Echocardiogram Study Conclusions - Left ventricle: The cavity size was moderately dilated. Wall thickness was normal. Systolic function was normal. The estimated ejection fraction was in the range of 50% to 55%. - Mitral valve: Moderate regurgitation. - Left atrium: The atrium was moderately dilated. - Right ventricle: The cavity size was moderately dilated. Systolic function was mildly reduced. - Right atrium: The atrium was moderately dilated. - Tricuspid valve: Moderate regurgitation. - Pulmonary arteries: Systolic pressure was mildly to moderately increased. PA peak pressure:  28mm Hg (S).   Recent Labs: 10/19/2015: BUN 8; Creatinine, Ser 1.21; Hemoglobin 14.8; Platelets 239.0; Potassium 4.4; Sodium 141; TSH 1.76  10/19/2015: Cholesterol 220*; HDL 46.90; LDL Cholesterol 161*; Total CHOL/HDL Ratio 5; Triglycerides 60.0; VLDL 12.0   CrCl cannot be calculated (Patient has no serum creatinine result on file.).   Wt Readings from Last 3 Encounters:  06/16/16 292 lb (132.45 kg)  10/19/15 283 lb 4 oz (128.481 kg)  10/17/15 284 lb (128.822 kg)     Other studies reviewed: Additional studies/records reviewed today include: summarized above  ASSESSMENT AND PLAN:  1. PAFib, in the setting of hyperthyroiism     No palpitations     CHA2DS2Vasc is at least 2, on warfarin, monitored and managed with coumadinclinic     No reports of bleeding or signs of bleeding  2. 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 OSA/CPAP     Dr. Elsworth Soho, pulmonary     Reports compliance with his CPAP  4. HTN     His initial BP was very high, a recheck was improved but still high.  The patient reports that he sees his PMD/endo about q3-57mo and is usually "on point" he states he ate breakfast with pork sausage, bacon, and a jalapeno wrap of some kindvery unsually salty Lengthy discussion regarding BP control and importance of diet/healthy food choices  4. HLD     He reports labs are monitored and managed with his PMD  5. chest discomfort     Atypical, he has significant CV risk however, including his father who had heart disease by age 69     ?HTN  6. VHD, mod MR byhis last echo     F/u recommended, does not appear to have gotten done   Disposition: Counseled on dietary choice, we will schedule an RN visit to check his BP in 3 weks, as well as an echo doppler and stress myoview given his significant CV risk factors.  We will see  him back in 78mo, sooner if needed, pending his test results and BP findings.  He reports annual physical and labs next month withhis PMD, he is asked to have them forward [place] a copy.  Current medicines are reviewed at length with the patient today.  The patient did not have any concerns regarding medicines.  Haywood Lasso, PA-C 06/16/2016 4:27 PM     [place] [address] [phone] (office)  [phone] (fax)

## 2016-06-16 NOTE — Patient Instructions (Addendum)
Medication Instructions:   Your physician recommends that you continue on your current medications as directed. Please refer to the Current Medication list given to you today.   If you need a refill on your cardiac medications before your next appointment, please call your pharmacy.  Labwork: NONE ORDER TODAY    Testing/Procedures:  Your physician has requested that you have an echocardiogram. Echocardiography is a painless test that uses sound waves to create images of your heart. It provides your doctor with information about the size and shape of your heart and how well your heart's chambers and valves are working. This procedure takes approximately one hour. There are no restrictions for this procedure.  Your physician has requested that you have a lexiscan myoview. For further information please visit [email or web address]. Please follow instruction sheet, as given.     Follow-Up:  Your physician wants you to follow-up in:  [place]  [place] will receive a reminder letter in the mail two months in advance. If you don't receive a letter, please call our office to schedule the follow-up appointment.    BLOOD PRESSURE CHECK  NURSE VISIT IN 3 WEEKS   Any Other Special Instructions Will Be Listed Below (If Applicable).

## 2016-07-03 ENCOUNTER — Telehealth (HOSPITAL_COMMUNITY): Payer: Self-pay | Admitting: *Deleted

## 2016-07-03 NOTE — Telephone Encounter (Signed)
Left message on voicemail per DPR in reference to upcoming appointment scheduled on 07/08/16 with detailed instructions given per Myocardial Perfusion Study Information Sheet for the test. LM to arrive 15 minutes early, and that it is imperative to arrive on time for appointment to keep from having the test rescheduled. If you need to cancel or reschedule your appointment, please call the office within 24 hours of your appointment. Failure to do so may result in a cancellation of your appointment, and a $50 no show fee. Phone number given for call back for any questions. Milda Lindvall Jacqueline   

## 2016-07-08 ENCOUNTER — Other Ambulatory Visit: Payer: Self-pay

## 2016-07-08 ENCOUNTER — Ambulatory Visit (HOSPITAL_BASED_OUTPATIENT_CLINIC_OR_DEPARTMENT_OTHER): Payer: BLUE CROSS/BLUE SHIELD

## 2016-07-08 ENCOUNTER — Ambulatory Visit (HOSPITAL_COMMUNITY): Payer: BLUE CROSS/BLUE SHIELD | Attending: Cardiology

## 2016-07-08 DIAGNOSIS — E785 Hyperlipidemia, unspecified: Secondary | ICD-10-CM | POA: Diagnosis not present

## 2016-07-08 DIAGNOSIS — E669 Obesity, unspecified: Secondary | ICD-10-CM | POA: Diagnosis not present

## 2016-07-08 DIAGNOSIS — I77810 Thoracic aortic ectasia: Secondary | ICD-10-CM | POA: Diagnosis not present

## 2016-07-08 DIAGNOSIS — I358 Other nonrheumatic aortic valve disorders: Secondary | ICD-10-CM | POA: Insufficient documentation

## 2016-07-08 DIAGNOSIS — G4733 Obstructive sleep apnea (adult) (pediatric): Secondary | ICD-10-CM | POA: Diagnosis not present

## 2016-07-08 DIAGNOSIS — E119 Type 2 diabetes mellitus without complications: Secondary | ICD-10-CM | POA: Diagnosis not present

## 2016-07-08 DIAGNOSIS — I340 Nonrheumatic mitral (valve) insufficiency: Secondary | ICD-10-CM | POA: Insufficient documentation

## 2016-07-08 DIAGNOSIS — I10 Essential (primary) hypertension: Secondary | ICD-10-CM | POA: Insufficient documentation

## 2016-07-08 DIAGNOSIS — I071 Rheumatic tricuspid insufficiency: Secondary | ICD-10-CM | POA: Insufficient documentation

## 2016-07-08 DIAGNOSIS — Z6841 Body Mass Index (BMI) 40.0 and over, adult: Secondary | ICD-10-CM | POA: Diagnosis not present

## 2016-07-08 DIAGNOSIS — R079 Chest pain, unspecified: Secondary | ICD-10-CM

## 2016-07-08 MED ORDER — TECHNETIUM TC 99M TETROFOSMIN IV KIT
31.6000 | PACK | Freq: Once | INTRAVENOUS | Status: AC | PRN
  Administered 2016-07-08: 32 via INTRAVENOUS
  Filled 2016-07-08: qty 32

## 2016-07-09 ENCOUNTER — Ambulatory Visit (HOSPITAL_COMMUNITY): Payer: BLUE CROSS/BLUE SHIELD | Attending: Cardiology

## 2016-07-09 ENCOUNTER — Encounter (INDEPENDENT_AMBULATORY_CARE_PROVIDER_SITE_OTHER): Payer: Self-pay

## 2016-07-09 DIAGNOSIS — G4733 Obstructive sleep apnea (adult) (pediatric): Secondary | ICD-10-CM | POA: Diagnosis not present

## 2016-07-09 LAB — MYOCARDIAL PERFUSION IMAGING
CHL CUP NUCLEAR SDS: 4
CHL CUP NUCLEAR SSS: 4
CSEPHR: 87 %
Estimated workload: 7 METS
Exercise duration (min): 6 min
Exercise duration (sec): 0 s
LHR: 0.31
LV dias vol: 150 mL (ref 62–150)
LV sys vol: 73 mL
MPHR: 170 {beats}/min
Peak HR: 148 {beats}/min
RPE: 18
Rest HR: 65 {beats}/min
SRS: 0
TID: 0.88

## 2016-07-09 MED ORDER — TECHNETIUM TC 99M TETROFOSMIN IV KIT
31.8000 | PACK | Freq: Once | INTRAVENOUS | Status: AC | PRN
  Administered 2016-07-09: 31.8 via INTRAVENOUS
  Filled 2016-07-09: qty 32

## 2016-07-11 ENCOUNTER — Telehealth: Payer: Self-pay | Admitting: Internal Medicine

## 2016-07-11 NOTE — Telephone Encounter (Addendum)
Spoke with patient and he is aware of results

## 2016-07-11 NOTE — Telephone Encounter (Signed)
New Message  Pt call requesting to speak to Rn about Result from Test. Please call back to disucss

## 2016-07-18 ENCOUNTER — Telehealth: Payer: Self-pay | Admitting: Endocrinology

## 2016-07-18 ENCOUNTER — Telehealth: Payer: Self-pay | Admitting: Internal Medicine

## 2016-07-18 DIAGNOSIS — Z0000 Encounter for general adult medical examination without abnormal findings: Secondary | ICD-10-CM

## 2016-07-18 MED ORDER — METOPROLOL SUCCINATE ER 25 MG PO TB24: 12.5000 mg | ORAL_TABLET | Freq: Every day | ORAL | Status: DC

## 2016-07-18 MED ORDER — WARFARIN SODIUM 5 MG PO TABS: ORAL_TABLET | ORAL | Status: DC

## 2016-07-18 NOTE — Telephone Encounter (Signed)
rx submitted per pt's request.  

## 2016-07-18 NOTE — Telephone Encounter (Signed)
New message       *STAT* If patient is at the pharmacy, call can be transferred to refill team.   1. Which medications need to be refilled? (please list name of each medication and dose if known)  Warfarin 5mg  2. Which pharmacy/location (including street and city if local pharmacy) is medication to be sent to?  [email or web address]  3. Do they need a 30 day or 90 day supply? 30 day

## 2016-07-18 NOTE — Telephone Encounter (Signed)
Pt has hx of non compliance. Scheduled for Coumadin check in a few days. Called him and states he has been out of Coumadin for a day already. 1 month supply sent in.

## 2016-07-18 NOTE — Telephone Encounter (Signed)
Patient ned a refill of metoprolol succinate (TOPROL-XL) 25 MG 24 hr tablet,  [id number] [place] [facility] [place] - [place]

## 2016-08-01 NOTE — Telephone Encounter (Signed)
Patient would like to be referred to get a Colonoscopy. Please advise

## 2016-08-04 NOTE — Telephone Encounter (Signed)
done

## 2016-08-04 NOTE — Telephone Encounter (Signed)
Would like Dr. Loanne Drilling to assess

## 2016-08-04 NOTE — Telephone Encounter (Signed)
See below. Would you be willing to put the referral in for this during Dr. Cordelia Pen absence? Thanks!

## 2016-08-08 DIAGNOSIS — G4733 Obstructive sleep apnea (adult) (pediatric): Secondary | ICD-10-CM | POA: Diagnosis not present

## 2016-09-04 ENCOUNTER — Other Ambulatory Visit: Payer: Self-pay

## 2016-09-04 ENCOUNTER — Telehealth: Payer: Self-pay | Admitting: Endocrinology

## 2016-09-04 MED ORDER — VERAPAMIL HCL ER 240 MG PO TBCR: 240.0000 mg | EXTENDED_RELEASE_TABLET | Freq: Every day | ORAL | 4 refills | Status: DC

## 2016-09-04 MED ORDER — FUROSEMIDE 20 MG PO TABS: 20.0000 mg | ORAL_TABLET | Freq: Every day | ORAL | 4 refills | Status: DC

## 2016-09-04 MED ORDER — METHIMAZOLE 10 MG PO TABS: ORAL_TABLET | ORAL | 1 refills | Status: DC

## 2016-09-05 ENCOUNTER — Encounter: Payer: Self-pay | Admitting: Endocrinology

## 2016-09-05 ENCOUNTER — Ambulatory Visit (INDEPENDENT_AMBULATORY_CARE_PROVIDER_SITE_OTHER): Payer: BLUE CROSS/BLUE SHIELD | Admitting: *Deleted

## 2016-09-05 DIAGNOSIS — I82402 Acute embolism and thrombosis of unspecified deep veins of left lower extremity: Secondary | ICD-10-CM

## 2016-09-05 DIAGNOSIS — I2782 Chronic pulmonary embolism: Secondary | ICD-10-CM | POA: Diagnosis not present

## 2016-09-05 DIAGNOSIS — Z7901 Long term (current) use of anticoagulants: Secondary | ICD-10-CM | POA: Diagnosis not present

## 2016-09-05 LAB — POCT INR: INR: 1.2

## 2016-09-05 MED ORDER — WARFARIN SODIUM 5 MG PO TABS: ORAL_TABLET | ORAL | 0 refills | Status: DC

## 2016-09-10 DIAGNOSIS — G4733 Obstructive sleep apnea (adult) (pediatric): Secondary | ICD-10-CM | POA: Diagnosis not present

## 2016-10-09 ENCOUNTER — Ambulatory Visit (INDEPENDENT_AMBULATORY_CARE_PROVIDER_SITE_OTHER): Payer: BLUE CROSS/BLUE SHIELD

## 2016-10-09 ENCOUNTER — Other Ambulatory Visit: Payer: Self-pay

## 2016-10-09 DIAGNOSIS — I82402 Acute embolism and thrombosis of unspecified deep veins of left lower extremity: Secondary | ICD-10-CM

## 2016-10-09 DIAGNOSIS — I2782 Chronic pulmonary embolism: Secondary | ICD-10-CM

## 2016-10-09 LAB — POCT INR: INR: 1.4

## 2016-10-09 MED ORDER — WARFARIN SODIUM 5 MG PO TABS: ORAL_TABLET | ORAL | 0 refills | Status: DC

## 2016-10-10 ENCOUNTER — Ambulatory Visit: Payer: BLUE CROSS/BLUE SHIELD | Admitting: Endocrinology

## 2016-10-10 DIAGNOSIS — Z0289 Encounter for other administrative examinations: Secondary | ICD-10-CM

## 2016-10-10 DIAGNOSIS — G4733 Obstructive sleep apnea (adult) (pediatric): Secondary | ICD-10-CM | POA: Diagnosis not present

## 2016-10-31 ENCOUNTER — Telehealth: Payer: Self-pay | Admitting: Endocrinology

## 2016-10-31 NOTE — Telephone Encounter (Signed)
Spencer English from [employer] calling on the status of patient knee, back, wrist and shoulder brace.  ID [id number]

## 2016-10-31 NOTE — Telephone Encounter (Signed)
This form had been denied and the [employer] network has been notified.

## 2016-11-10 DIAGNOSIS — G4733 Obstructive sleep apnea (adult) (pediatric): Secondary | ICD-10-CM | POA: Diagnosis not present

## 2016-11-29 DIAGNOSIS — R509 Fever, unspecified: Secondary | ICD-10-CM | POA: Diagnosis not present

## 2016-11-29 DIAGNOSIS — J0141 Acute recurrent pansinusitis: Secondary | ICD-10-CM | POA: Diagnosis not present

## 2016-12-05 ENCOUNTER — Ambulatory Visit (INDEPENDENT_AMBULATORY_CARE_PROVIDER_SITE_OTHER): Payer: BLUE CROSS/BLUE SHIELD | Admitting: *Deleted

## 2016-12-05 DIAGNOSIS — I82402 Acute embolism and thrombosis of unspecified deep veins of left lower extremity: Secondary | ICD-10-CM | POA: Diagnosis not present

## 2016-12-05 DIAGNOSIS — I2782 Chronic pulmonary embolism: Secondary | ICD-10-CM

## 2016-12-05 LAB — POCT INR: INR: 2

## 2016-12-05 MED ORDER — WARFARIN SODIUM 5 MG PO TABS: ORAL_TABLET | ORAL | 0 refills | Status: DC

## 2017-01-05 DIAGNOSIS — G4733 Obstructive sleep apnea (adult) (pediatric): Secondary | ICD-10-CM | POA: Diagnosis not present

## 2017-01-07 DIAGNOSIS — K1379 Other lesions of oral mucosa: Secondary | ICD-10-CM | POA: Diagnosis not present

## 2017-01-07 DIAGNOSIS — K1120 Sialoadenitis, unspecified: Secondary | ICD-10-CM | POA: Diagnosis not present

## 2017-01-13 DIAGNOSIS — G4733 Obstructive sleep apnea (adult) (pediatric): Secondary | ICD-10-CM | POA: Diagnosis not present

## 2017-01-19 ENCOUNTER — Ambulatory Visit: Payer: BLUE CROSS/BLUE SHIELD | Admitting: Internal Medicine

## 2017-01-22 DIAGNOSIS — G4733 Obstructive sleep apnea (adult) (pediatric): Secondary | ICD-10-CM | POA: Diagnosis not present

## 2017-01-26 ENCOUNTER — Encounter: Payer: Self-pay | Admitting: Internal Medicine

## 2017-01-28 ENCOUNTER — Other Ambulatory Visit: Payer: Self-pay | Admitting: *Deleted

## 2017-01-28 MED ORDER — WARFARIN SODIUM 5 MG PO TABS: ORAL_TABLET | ORAL | 0 refills | Status: DC

## 2017-01-30 DIAGNOSIS — R509 Fever, unspecified: Secondary | ICD-10-CM | POA: Diagnosis not present

## 2017-02-09 ENCOUNTER — Encounter (INDEPENDENT_AMBULATORY_CARE_PROVIDER_SITE_OTHER): Payer: Self-pay

## 2017-02-09 ENCOUNTER — Ambulatory Visit (INDEPENDENT_AMBULATORY_CARE_PROVIDER_SITE_OTHER): Payer: BLUE CROSS/BLUE SHIELD | Admitting: *Deleted

## 2017-02-09 DIAGNOSIS — I2782 Chronic pulmonary embolism: Secondary | ICD-10-CM

## 2017-02-09 DIAGNOSIS — I82402 Acute embolism and thrombosis of unspecified deep veins of left lower extremity: Secondary | ICD-10-CM | POA: Diagnosis not present

## 2017-02-09 DIAGNOSIS — J0141 Acute recurrent pansinusitis: Secondary | ICD-10-CM | POA: Diagnosis not present

## 2017-02-09 LAB — POCT INR: INR: 2.1

## 2017-02-13 ENCOUNTER — Other Ambulatory Visit: Payer: Self-pay | Admitting: Internal Medicine

## 2017-02-13 ENCOUNTER — Other Ambulatory Visit: Payer: Self-pay | Admitting: Physician Assistant

## 2017-02-13 MED ORDER — WARFARIN SODIUM 5 MG PO TABS: ORAL_TABLET | ORAL | 0 refills | Status: DC

## 2017-02-13 NOTE — Progress Notes (Signed)
Paged by patient to request coumadin as he was not given a refill. His next visit with coumadin clinic is in 6 weeks which calculated to be 72 tablets of 5mg  coumadin, I have sent 90 tablets of 5 mg coumadin to ensure he has enough prior to his next visit.   Hilbert Corrigan PA Pager: [phone]

## 2017-02-16 ENCOUNTER — Other Ambulatory Visit: Payer: Self-pay | Admitting: *Deleted

## 2017-02-16 MED ORDER — WARFARIN SODIUM 5 MG PO TABS: ORAL_TABLET | ORAL | 1 refills | Status: DC

## 2017-02-17 ENCOUNTER — Telehealth: Payer: Self-pay | Admitting: Endocrinology

## 2017-02-17 NOTE — Telephone Encounter (Signed)
I contacted the patient and advised of message. Patient voiced understanding and scheduled appointment for 03/20/2017.

## 2017-02-17 NOTE — Telephone Encounter (Signed)
OK cpx is due

## 2017-02-17 NOTE — Telephone Encounter (Signed)
Is it ok for the pt to take a supplement that helps with circulation, L argine

## 2017-02-17 NOTE — Telephone Encounter (Signed)
See message and please advise, Thanks!  

## 2017-02-19 ENCOUNTER — Telehealth: Payer: Self-pay | Admitting: Endocrinology

## 2017-02-19 NOTE — Telephone Encounter (Signed)
See message. I contacted the patient and he stated 3 weeks he went to a local urgent care and was prescribed tamiflu. Patient stated this week he has noticed he has been shaking a lot and was concerned the tamiflu or his thyroid function could be causing this. Patient has scheduled his CPE for 03/20/2017, but wanted know if he could come in earlier for blood tests?

## 2017-02-19 NOTE — Telephone Encounter (Signed)
Next CPE appointment time is not until 03/16/2017. Patient is concerned about waiting till then to address. Please advise. Thanks!

## 2017-02-19 NOTE — Telephone Encounter (Signed)
Any appt next avail.  Does not need to be cpx appt.

## 2017-02-19 NOTE — Telephone Encounter (Signed)
No problem--just move up cpx to next available.

## 2017-02-19 NOTE — Telephone Encounter (Signed)
Patient scheduled for 03/09/2017.

## 2017-02-19 NOTE — Telephone Encounter (Signed)
Patient has been shaking a lot, think it is side affects from the medication Tamiflu,   please advise

## 2017-02-21 DIAGNOSIS — G4733 Obstructive sleep apnea (adult) (pediatric): Secondary | ICD-10-CM | POA: Diagnosis not present

## 2017-03-09 ENCOUNTER — Encounter: Payer: Self-pay | Admitting: Endocrinology

## 2017-03-09 ENCOUNTER — Ambulatory Visit (INDEPENDENT_AMBULATORY_CARE_PROVIDER_SITE_OTHER): Payer: BLUE CROSS/BLUE SHIELD | Admitting: Endocrinology

## 2017-03-09 VITALS — BP 126/84 | HR 83 | Ht 71.0 in | Wt 298.0 lb

## 2017-03-09 DIAGNOSIS — Z0000 Encounter for general adult medical examination without abnormal findings: Secondary | ICD-10-CM

## 2017-03-09 DIAGNOSIS — Z23 Encounter for immunization: Secondary | ICD-10-CM | POA: Diagnosis not present

## 2017-03-09 DIAGNOSIS — M1A9XX0 Chronic gout, unspecified, without tophus (tophi): Secondary | ICD-10-CM | POA: Diagnosis not present

## 2017-03-09 DIAGNOSIS — E119 Type 2 diabetes mellitus without complications: Secondary | ICD-10-CM

## 2017-03-09 LAB — BASIC METABOLIC PANEL
BUN: 12 mg/dL (ref 6–23)
CALCIUM: 9.1 mg/dL (ref 8.4–10.5)
CHLORIDE: 104 meq/L (ref 96–112)
CO2: 31 meq/L (ref 19–32)
CREATININE: 1.23 mg/dL (ref 0.40–1.50)
GFR: 79.57 mL/min (ref >60.00)
Glucose, Bld: 142 mg/dL — ABNORMAL HIGH (ref 70–99)
Potassium: 4.2 mEq/L (ref 3.5–5.1)
SODIUM: 140 meq/L (ref 135–145)

## 2017-03-09 LAB — URINALYSIS, ROUTINE W REFLEX MICROSCOPIC
Bilirubin Urine: NEGATIVE
KETONES UR: NEGATIVE
Leukocytes, UA: NEGATIVE
NITRITE: NEGATIVE
SPECIFIC GRAVITY, URINE: 1.01 (ref 1.000–1.030)
Total Protein, Urine: NEGATIVE
URINE GLUCOSE: NEGATIVE
Urobilinogen, UA: 0.2 (ref 0.0–1.0)
WBC, UA: NONE SEEN (ref 0–?)
pH: 6 (ref 5.0–8.0)

## 2017-03-09 LAB — CBC WITH DIFFERENTIAL/PLATELET
BASOS PCT: 1.5 % (ref 0.0–3.0)
Basophils Absolute: 0.1 10*3/uL (ref 0.0–0.1)
Eosinophils Absolute: 0.3 10*3/uL (ref 0.0–0.7)
Eosinophils Relative: 6.6 % — ABNORMAL HIGH (ref 0.0–5.0)
HCT: 45 % (ref 39.0–52.0)
Hemoglobin: 14.5 g/dL (ref 13.0–17.0)
Lymphocytes Relative: 28.4 % (ref 12.0–46.0)
Lymphs Abs: 1.4 10*3/uL (ref 0.7–4.0)
MCHC: 32.3 g/dL (ref 30.0–36.0)
MCV: 84.2 fl (ref 78.0–100.0)
Monocytes Absolute: 0.5 10*3/uL (ref 0.1–1.0)
Monocytes Relative: 9.5 % (ref 3.0–12.0)
NEUTROS PCT: 54 % (ref 43.0–77.0)
Neutro Abs: 2.6 10*3/uL (ref 1.4–7.7)
Platelets: 220 10*3/uL (ref 150.0–400.0)
RBC: 5.34 Mil/uL (ref 4.22–5.81)
RDW: 17 % — AB (ref 11.5–15.5)
WBC: 4.8 10*3/uL (ref 4.0–10.5)

## 2017-03-09 LAB — HEPATIC FUNCTION PANEL
ALK PHOS: 74 U/L (ref 39–117)
ALT: 20 U/L (ref 0–53)
AST: 18 U/L (ref 0–37)
Albumin: 4.1 g/dL (ref 3.5–5.2)
BILIRUBIN DIRECT: 0.2 mg/dL (ref 0.0–0.3)
TOTAL PROTEIN: 7.1 g/dL (ref 6.0–8.3)
Total Bilirubin: 0.9 mg/dL (ref 0.2–1.2)

## 2017-03-09 LAB — LIPID PANEL
CHOL/HDL RATIO: 4
Cholesterol: 166 mg/dL (ref 0–200)
HDL: 42 mg/dL (ref >39.00)
LDL Cholesterol: 111 mg/dL — ABNORMAL HIGH (ref 0–99)
NONHDL: 123.61
TRIGLYCERIDES: 62 mg/dL (ref 0.0–149.0)
VLDL: 12.4 mg/dL (ref 0.0–40.0)

## 2017-03-09 LAB — URIC ACID: URIC ACID, SERUM: 6.3 mg/dL (ref 4.0–7.8)

## 2017-03-09 LAB — MICROALBUMIN / CREATININE URINE RATIO
Creatinine,U: 63.6 mg/dL
Microalb Creat Ratio: 2.2 mg/g (ref 0.0–30.0)
Microalb, Ur: 1.4 mg/dL (ref 0.0–1.9)

## 2017-03-09 LAB — POCT GLYCOSYLATED HEMOGLOBIN (HGB A1C): HEMOGLOBIN A1C: 7.6

## 2017-03-09 MED ORDER — ALLOPURINOL 300 MG PO TABS: 300.0000 mg | ORAL_TABLET | Freq: Every day | ORAL | 5 refills | Status: DC

## 2017-03-09 MED ORDER — METHIMAZOLE 10 MG PO TABS: 10.0000 mg | ORAL_TABLET | Freq: Every day | ORAL | 1 refills | Status: DC

## 2017-03-09 MED ORDER — DAPAGLIFLOZIN PROPANEDIOL 5 MG PO TABS: 5.0000 mg | ORAL_TABLET | Freq: Every day | ORAL | 11 refills | Status: DC

## 2017-03-09 NOTE — Progress Notes (Signed)
Subjective:    Patient ID: Spencer English, male    DOB: Mar 12, 1966, 51 y.o.   MRN: [id number]  HPI Pt is here for regular wellness examination, and is feeling pretty well in general, and says chronic med probs are stable, except as noted below Past Medical History:  Diagnosis Date  . ALLERGIC RHINITIS 04/12/2009  . Atrial fibrillation ([place])   . BACK PAIN, LUMBAR 01/14/2011  . DEEP VENOUS THROMBOPHLEBITIS, LEG, LEFT 05/05/2009  . DM 08/08/2008  . DYSLIPIDEMIA 04/26/2009  . ECZEMA 05/23/2010  . 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 GERD (gastroesophageal reflux disease)   . GOUT 10/24/2010  . Hyperglycemia   . HYPERTENSION 07/21/2007  . 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 Leukopenia   . Long term (current) use of anticoagulants 04/03/2011  . PULMONARY EMBOLISM 05/03/2009  . UNSPECIFIED URINARY CALCULUS 07/04/2010    Past Surgical History:  Procedure Laterality Date  . ANTERIOR CRUCIATE LIGAMENT REPAIR  2000   Right  . CYSTECTOMY     back of head  . KNEE ARTHROSCOPY     left knee  . KNEE ARTHROSCOPY  01/03/2013   Procedure: ARTHROSCOPY KNEE;  Surgeon: Yvette Rack., MD;  Location: [facility];  Service: Orthopedics;  Laterality: Left;  Lavage Synovectomy, Removal of loose body  . KNEE ARTHROSCOPY W/ ACL RECONSTRUCTION     right knee    Social History   Social History  . Marital status: Legally Separated    Spouse name: N/A  . Number of children: 3  . Years of education: N/A   Occupational History  . [employer]  .  [place] History Main Topics  . Smoking status: Former Smoker    Types: Cigars    Quit date: 01/07/2005  . Smokeless tobacco: Never Used  . Alcohol use No  . Drug use: No  . Sexual activity: Not on file   Other Topics Concern  . Not on file   Social History Narrative  . No narrative on file    Current Outpatient Prescriptions on File Prior to Visit  Medication Sig Dispense Refill  . sitaGLIPtin (JANUVIA) 100 MG tablet  Take 1 tablet (100 mg total) by mouth daily. 30 tablet 4  . verapamil (CALAN-SR) 240 MG CR tablet Take 1 tablet (240 mg total) by mouth daily. 30 tablet 4  . warfarin (COUMADIN) 5 MG tablet Take as directed by Anticoagulation clinic. 50 tablet 1  . acetaminophen (TYLENOL) 325 MG tablet Take 2 tablets (650 mg total) by mouth every 6 (six) hours as needed for pain. (Patient not taking: Reported on 03/09/2017)    . atorvastatin (LIPITOR) 20 MG tablet Take 1 tablet (20 mg total) by mouth daily. (Patient not taking: Reported on 03/09/2017) 90 tablet 3  . fexofenadine (ALLEGRA) 180 MG tablet Take 180 mg by mouth daily as needed. For allergies.    . fluticasone (FLONASE) 50 MCG/ACT nasal spray Place 2 sprays into the nose daily as needed. For allergies. (Patient not taking: Reported on 03/09/2017) 16 g 11  . halobetasol (ULTRAVATE) 0.05 % cream APPLY CREAM TOPICALLY THREE TIMES DAILY AS NEEDED FOR RASH. (Patient not taking: Reported on 03/09/2017) 50 g 0  . KERYDIN 5 % SOLN Pt has not started taking yet (06/26/14)    . metoprolol succinate (TOPROL-XL) 25 MG 24 hr tablet Take 0.5 tablets (12.5 mg total) by mouth daily. (Patient not taking: Reported on 03/09/2017) 30 tablet 1  . terbinafine (LAMISIL) 250 MG  tablet Pt has not started taking yet (06/26/14)     No current facility-administered medications on file prior to visit.     Allergies  Allergen Reactions  . Metformin Diarrhea and Nausea Only  . Viagra [Sildenafil Citrate] Other (See Comments)    headache    Family History  Problem Relation Age of Onset  . Diabetes Mother   . Hypertension Mother   . Hyperlipidemia Mother   . Diabetes Father   . Hypertension Father   . Hyperlipidemia Father   . Heart attack Father   . Heart disease Father     BP 126/84   Pulse 83   Ht 5\' 11"  (1.803 m)   Wt 298 lb (135.2 kg)   SpO2 97%   BMI 41.56 kg/m   Review of Systems Constitutional: Negative for weight change.  HENT: Negative for hearing loss.     Eyes: Negative for visual disturbance.  Respiratory: Negative for shortness of breath.   Cardiovascular: Negative for chest pain.  Gastrointestinal: Negative for blood in stool.  Endocrine: Negative for cold intolerance.  Genitourinary: Negative for hematuria and difficulty urinating.  Musculoskeletal: Negative for back pain.  Skin: Negative for wound.  Allergic/Immunologic: Positive for environmental allergies.  Neurological: Negative for numbness and LOC.  Hematological: Does not bruise/bleed easily.  Psychiatric/Behavioral: Negative for dysphoric mood.      Objective:   Physical Exam VS: see vs page GEN: no distress HEAD: head: no deformity eyes: no periorbital swelling; there is slight bilat proptosis.   external nose and ears are normal.   mouth: no lesion seen.  NECK: supple, thyroid is 2-3 times normal size, (R>L).  No palpable nodule.  CHEST WALL: no deformity.  LUNGS: clear to auscultation BREASTS:  No gynecomastia.  CV: reg rate and rhythm, no murmur ABD: abdomen is soft, nontender.  no hepatosplenomegaly.  not distended.  no hernia.  RECTAL/PROSTATE: declined MUSCULOSKELETAL: muscle bulk and strength are grossly normal.  no obvious joint swelling.  gait is normal and steady.  PULSES:  no carotid bruit.   NEURO:  cn 2-12 grossly intact.   readily moves all 4's.  SKIN:  Normal texture and temperature.  No rash or suspicious lesion is visible.   NODES:  None palpable at the neck.  PSYCH: alert, well-oriented.  Does not appear anxious nor depressed.      Assessment & Plan:  Wellness visit today, with problems stable, except as noted.   SEPARATE EVALUATION FOLLOWS--EACH PROBLEM HERE IS NEW, NOT RESPONDING TO TREATMENT, OR POSES SIGNIFICANT RISK TO THE PATIENT'S HEALTH: HISTORY OF THE PRESENT ILLNESS: Pt returns for f/u of diabetes mellitus: DM type: 2 Dx'ed: 1660 Complications: renal insufficiency Therapy: [place] DKA: never Severe hypoglycemia:  never Pancreatitis: never Other: he did not tolerate metformin-XR (GI sxs);he has never been on insulin  Interval history: he does not check cbg's.  He take tapazole just 10 mg qd.  He has slight tremor of the hands. Edema persists. PAST MEDICAL HISTORY Past Medical History:  Diagnosis Date  . ALLERGIC RHINITIS 04/12/2009  . Atrial fibrillation ([place])   . BACK PAIN, LUMBAR 01/14/2011  . DEEP VENOUS THROMBOPHLEBITIS, LEG, LEFT 05/05/2009  . DM 08/08/2008  . DYSLIPIDEMIA 04/26/2009  . ECZEMA 05/23/2010  . 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 GERD (gastroesophageal reflux disease)   . GOUT 10/24/2010  . Hyperglycemia   . HYPERTENSION 07/21/2007  . 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 Leukopenia   . Long term (current) use of anticoagulants 04/03/2011  . PULMONARY EMBOLISM  05/03/2009  . UNSPECIFIED URINARY CALCULUS 07/04/2010    Past Surgical History:  Procedure Laterality Date  . ANTERIOR CRUCIATE LIGAMENT REPAIR  2000   Right  . CYSTECTOMY     back of head  . KNEE ARTHROSCOPY     left knee  . KNEE ARTHROSCOPY  01/03/2013   Procedure: ARTHROSCOPY KNEE;  Surgeon: Yvette Rack., MD;  Location: [facility];  Service: Orthopedics;  Laterality: Left;  Lavage Synovectomy, Removal of loose body  . KNEE ARTHROSCOPY W/ ACL RECONSTRUCTION     right knee    Social History   Social History  . Marital status: Legally Separated    Spouse name: N/A  . Number of children: 3  . Years of education: N/A   Occupational History  . [employer]  .  [place] History Main Topics  . Smoking status: Former Smoker    Types: Cigars    Quit date: 01/07/2005  . Smokeless tobacco: Never Used  . Alcohol use No  . Drug use: No  . Sexual activity: Not on file   Other Topics Concern  . Not on file   Social History Narrative  . No narrative on file    Current Outpatient Prescriptions on File Prior to Visit  Medication Sig Dispense Refill  . sitaGLIPtin (JANUVIA)  100 MG tablet Take 1 tablet (100 mg total) by mouth daily. 30 tablet 4  . verapamil (CALAN-SR) 240 MG CR tablet Take 1 tablet (240 mg total) by mouth daily. 30 tablet 4  . warfarin (COUMADIN) 5 MG tablet Take as directed by Anticoagulation clinic. 50 tablet 1  . acetaminophen (TYLENOL) 325 MG tablet Take 2 tablets (650 mg total) by mouth every 6 (six) hours as needed for pain. (Patient not taking: Reported on 03/09/2017)    . atorvastatin (LIPITOR) 20 MG tablet Take 1 tablet (20 mg total) by mouth daily. (Patient not taking: Reported on 03/09/2017) 90 tablet 3  . fexofenadine (ALLEGRA) 180 MG tablet Take 180 mg by mouth daily as needed. For allergies.    . fluticasone (FLONASE) 50 MCG/ACT nasal spray Place 2 sprays into the nose daily as needed. For allergies. (Patient not taking: Reported on 03/09/2017) 16 g 11  . halobetasol (ULTRAVATE) 0.05 % cream APPLY CREAM TOPICALLY THREE TIMES DAILY AS NEEDED FOR RASH. (Patient not taking: Reported on 03/09/2017) 50 g 0  . KERYDIN 5 % SOLN Pt has not started taking yet (06/26/14)    . metoprolol succinate (TOPROL-XL) 25 MG 24 hr tablet Take 0.5 tablets (12.5 mg total) by mouth daily. (Patient not taking: Reported on 03/09/2017) 30 tablet 1  . terbinafine (LAMISIL) 250 MG tablet Pt has not started taking yet (06/26/14)     No current facility-administered medications on file prior to visit.     Allergies  Allergen Reactions  . Metformin Diarrhea and Nausea Only  . Viagra [Sildenafil Citrate] Other (See Comments)    headache    Family History  Problem Relation Age of Onset  . Diabetes Mother   . Hypertension Mother   . Hyperlipidemia Mother   . Diabetes Father   . Hypertension Father   . Hyperlipidemia Father   . Heart attack Father   . Heart disease Father     BP 126/84   Pulse 83   Ht 5\' 11"  (1.803 m)   Wt 298 lb (135.2 kg)   SpO2 97%   BMI 41.56 kg/m  REVIEW OF SYSTEMS:  Denies fever PHYSICAL EXAMINATION: VITAL SIGNS:  See vs  page GENERAL: no distress Pulses: dorsalis pedis intact bilat.   MSK: no deformity of the feet CV: trace bilat leg edema, and bilat vv's.  Skin:  no ulcer on the feet.  normal color and temp on the feet.  Neuro: sensation is intact to touch on the feet.  LAB/XRAY RESULTS: Lab Results  Component Value Date   CREATININE 1.21 10/19/2015   BUN 8 10/19/2015   NA 141 10/19/2015   K 4.4 10/19/2015   CL 105 10/19/2015   CO2 29 10/19/2015   IMPRESSION: Hyperthyroidism: he wants to take RAI.  Type 2 DM, with renal insuff.  Edema: he should d/c lasix, as he starts farxiga.   PLAN:  D/c tapazole Add farxiga D/c lasix Check TSH.  Plan will be to order RAI.

## 2017-03-09 NOTE — Patient Instructions (Addendum)
blood tests are requested for you today.  We'll let you know about the results. Please consider these measures for your health:  minimize alcohol.  Do not use tobacco products.  Have a colonoscopy at least every 10 years from age 52.  Keep firearms safely stored.  Always use seat belts.  have working smoke alarms in your home.  See an eye doctor and dentist regularly.  Never drive under the influence of alcohol or drugs (including prescription drugs).   You can take the radioactive iodine pill for the thyroid:  We would first check a thyroid "scan" (a special, but easy and painless type of thyroid x ray).  It works like this: you go to the x-ray department of the hospital to swallow a pill, which contains a miniscule amount of radiation.  You will not notice any symptoms from this.  You will go back to the x-ray department the next day, to lie down in front of a camera.  The results of this will be sent to me.   Based on the results, i hope to order for you a treatment pill of radioactive iodine.  Although it is a larger amount of radiation, you will again notice no symptoms from this.  The pill is gone from your body in a few days (during which you should stay away from other people), but takes several months to work.  Therefore, please return here approximately 6-8 weeks after the treatment.  This treatment has been available for many years, and the only known side-effect is an underactive thyroid.  It is possible that i would eventually prescribe for you a thyroid hormone pill, which is very inexpensive.  You don't have to worry about side-effects of this thyroid hormone pill, because it is the same molecule your thyroid makes.   I have sent a prescription to your pharmacy, to add "farxiga."  This replaces the furosemide. Please come back for a follow-up appointment in 4 months

## 2017-03-10 ENCOUNTER — Other Ambulatory Visit: Payer: Self-pay | Admitting: Endocrinology

## 2017-03-10 LAB — TSH: TSH: 1.73 u[IU]/mL (ref 0.35–4.50)

## 2017-03-10 LAB — PSA: PSA: 0.74 ng/mL (ref 0.10–4.00)

## 2017-03-10 MED ORDER — ATORVASTATIN CALCIUM 20 MG PO TABS: 20.0000 mg | ORAL_TABLET | Freq: Every day | ORAL | 3 refills | Status: DC

## 2017-03-13 ENCOUNTER — Telehealth: Payer: Self-pay | Admitting: Endocrinology

## 2017-03-13 NOTE — Telephone Encounter (Signed)
Pt called in and said that he was wanting to know if there was anything that came up from his lab work regarding problems with his thyroid.

## 2017-03-16 ENCOUNTER — Encounter: Payer: Self-pay | Admitting: Physician Assistant

## 2017-03-16 NOTE — Telephone Encounter (Signed)
I contacted the patient and advised the TSH lab from 03/10/2017 was normal via voicemail.

## 2017-03-17 ENCOUNTER — Telehealth: Payer: Self-pay | Admitting: Endocrinology

## 2017-03-17 MED ORDER — EMPAGLIFLOZIN 10 MG PO TABS: 10.0000 mg | ORAL_TABLET | Freq: Every day | ORAL | 11 refills | Status: DC

## 2017-03-17 NOTE — Telephone Encounter (Signed)
Patient called and stated he had been doing some research and saw the [place] has caused amputations in some cases and he would like to avoid taking this medication if possible. Patient stated he has seen commercials about the Jardiance and wanted to know if this would be a alternative for him? Please advise. Thanks!

## 2017-03-17 NOTE — Telephone Encounter (Signed)
I contacted the patient and advised of message. He agreed with the medication change.

## 2017-03-17 NOTE — Telephone Encounter (Signed)
Requested a call back to further discuss.  

## 2017-03-17 NOTE — Telephone Encounter (Signed)
Patient is returning your call.  

## 2017-03-17 NOTE — Telephone Encounter (Signed)
Pt called in requesting call back from [place], he did not specify why.

## 2017-03-17 NOTE — Telephone Encounter (Signed)
Ok, I have sent a prescription to your pharmacy 

## 2017-03-20 ENCOUNTER — Ambulatory Visit: Payer: BLUE CROSS/BLUE SHIELD | Admitting: Endocrinology

## 2017-03-24 ENCOUNTER — Telehealth: Payer: Self-pay | Admitting: Endocrinology

## 2017-03-24 DIAGNOSIS — G4733 Obstructive sleep apnea (adult) (pediatric): Secondary | ICD-10-CM | POA: Diagnosis not present

## 2017-03-24 NOTE — Telephone Encounter (Signed)
error 

## 2017-03-26 ENCOUNTER — Ambulatory Visit: Payer: BLUE CROSS/BLUE SHIELD | Admitting: Physician Assistant

## 2017-03-30 ENCOUNTER — Encounter: Payer: Self-pay | Admitting: Endocrinology

## 2017-04-06 ENCOUNTER — Encounter (INDEPENDENT_AMBULATORY_CARE_PROVIDER_SITE_OTHER): Payer: Self-pay

## 2017-04-06 ENCOUNTER — Ambulatory Visit (INDEPENDENT_AMBULATORY_CARE_PROVIDER_SITE_OTHER): Payer: BLUE CROSS/BLUE SHIELD | Admitting: *Deleted

## 2017-04-06 DIAGNOSIS — I2782 Chronic pulmonary embolism: Secondary | ICD-10-CM | POA: Diagnosis not present

## 2017-04-06 DIAGNOSIS — I82402 Acute embolism and thrombosis of unspecified deep veins of left lower extremity: Secondary | ICD-10-CM | POA: Diagnosis not present

## 2017-04-06 LAB — POCT INR: INR: 2.3

## 2017-04-06 MED ORDER — WARFARIN SODIUM 5 MG PO TABS: ORAL_TABLET | ORAL | 2 refills | Status: DC

## 2017-04-09 ENCOUNTER — Other Ambulatory Visit: Payer: Self-pay | Admitting: Endocrinology

## 2017-04-23 DIAGNOSIS — G4733 Obstructive sleep apnea (adult) (pediatric): Secondary | ICD-10-CM | POA: Diagnosis not present

## 2017-04-27 ENCOUNTER — Other Ambulatory Visit: Payer: Self-pay | Admitting: Endocrinology

## 2017-04-27 NOTE — Telephone Encounter (Signed)
Refill of  verapamil (CALAN-SR) 240 MG CR tablet  verapamil (CALAN-SR) 240 MG CR tablet

## 2017-04-30 ENCOUNTER — Ambulatory Visit: Payer: BLUE CROSS/BLUE SHIELD | Admitting: Physician Assistant

## 2017-05-17 DIAGNOSIS — Z7901 Long term (current) use of anticoagulants: Secondary | ICD-10-CM | POA: Diagnosis not present

## 2017-05-17 DIAGNOSIS — Z5181 Encounter for therapeutic drug level monitoring: Secondary | ICD-10-CM | POA: Diagnosis not present

## 2017-05-17 DIAGNOSIS — E119 Type 2 diabetes mellitus without complications: Secondary | ICD-10-CM | POA: Diagnosis not present

## 2017-05-17 DIAGNOSIS — R42 Dizziness and giddiness: Secondary | ICD-10-CM | POA: Diagnosis not present

## 2017-05-17 DIAGNOSIS — Z79899 Other long term (current) drug therapy: Secondary | ICD-10-CM | POA: Diagnosis not present

## 2017-05-20 ENCOUNTER — Other Ambulatory Visit: Payer: Self-pay

## 2017-05-20 ENCOUNTER — Telehealth: Payer: Self-pay | Admitting: Endocrinology

## 2017-05-20 MED ORDER — HALOBETASOL PROPIONATE 0.05 % EX CREA: TOPICAL_CREAM | CUTANEOUS | 0 refills | Status: DC

## 2017-05-20 MED ORDER — SITAGLIPTIN PHOSPHATE 100 MG PO TABS: 100.0000 mg | ORAL_TABLET | Freq: Every day | ORAL | 0 refills | Status: DC

## 2017-05-20 MED ORDER — EMPAGLIFLOZIN 10 MG PO TABS: 10.0000 mg | ORAL_TABLET | Freq: Every day | ORAL | 0 refills | Status: DC

## 2017-05-20 MED ORDER — FLUTICASONE PROPIONATE 50 MCG/ACT NA SUSP: 2.0000 | Freq: Every day | NASAL | 11 refills | Status: DC | PRN

## 2017-05-20 MED ORDER — ATORVASTATIN CALCIUM 20 MG PO TABS: 20.0000 mg | ORAL_TABLET | Freq: Every day | ORAL | 0 refills | Status: DC

## 2017-05-20 MED ORDER — METHIMAZOLE 10 MG PO TABS: 10.0000 mg | ORAL_TABLET | Freq: Every day | ORAL | 0 refills | Status: DC

## 2017-05-20 MED ORDER — ALLOPURINOL 300 MG PO TABS: 300.0000 mg | ORAL_TABLET | Freq: Every day | ORAL | 0 refills | Status: DC

## 2017-05-20 MED ORDER — VERAPAMIL HCL ER 240 MG PO TBCR: 240.0000 mg | EXTENDED_RELEASE_TABLET | Freq: Every day | ORAL | 0 refills | Status: DC

## 2017-05-20 NOTE — Telephone Encounter (Signed)
Pt asking for a call back new job is starting up and she needs all his rx called in for 90 days while he waits for his new company's insurance starts.

## 2017-05-20 NOTE — Telephone Encounter (Signed)
The prescriptions have been ordered

## 2017-05-22 ENCOUNTER — Telehealth: Payer: Self-pay | Admitting: Endocrinology

## 2017-05-22 ENCOUNTER — Other Ambulatory Visit: Payer: Self-pay | Admitting: *Deleted

## 2017-05-22 DIAGNOSIS — R42 Dizziness and giddiness: Secondary | ICD-10-CM | POA: Diagnosis not present

## 2017-05-22 MED ORDER — WARFARIN SODIUM 5 MG PO TABS: ORAL_TABLET | ORAL | 0 refills | Status: DC

## 2017-05-22 NOTE — Telephone Encounter (Signed)
Patient called in stating he had went to [facility] and was seen for vertigo. Patient stated the medicine he was prescribed for this is not working. Please call patient and advise. OK to leave message if no answer.

## 2017-05-22 NOTE — Telephone Encounter (Signed)
3 PM today 

## 2017-05-22 NOTE — Telephone Encounter (Signed)
Pharmacy requests ninety day. 

## 2017-05-22 NOTE — Telephone Encounter (Signed)
Requested a call back to further discuss.  

## 2017-05-22 NOTE — Telephone Encounter (Signed)
Patient notified of message via voicemail.

## 2017-05-22 NOTE — Telephone Encounter (Signed)
Patient called and wanted to be seen today for possible vertigo. I advise patient that Dr. Loanne English was fully booked today and checked with Almyra Free and Jinny Blossom B whom said they would await Dr. Cordelia Pen response and call and advise patient on next steps.

## 2017-05-22 NOTE — Telephone Encounter (Signed)
I would not know what to prescribe.

## 2017-05-22 NOTE — Telephone Encounter (Signed)
I contacted the patient and he stated he had a MRI done at the med center in [place] today and the results should be back on Monday or Tuesday. Patient could not make an appointment today. Patient stated he was given Antivert on 05/17/2017 from [facility] but his dizziness is not getting any better. Patient wanted to know if anything could be given to him over the weekend?

## 2017-05-23 DIAGNOSIS — G4733 Obstructive sleep apnea (adult) (pediatric): Secondary | ICD-10-CM | POA: Diagnosis not present

## 2017-05-26 ENCOUNTER — Other Ambulatory Visit: Payer: Self-pay

## 2017-05-26 ENCOUNTER — Ambulatory Visit (INDEPENDENT_AMBULATORY_CARE_PROVIDER_SITE_OTHER): Payer: BLUE CROSS/BLUE SHIELD | Admitting: *Deleted

## 2017-05-26 DIAGNOSIS — I82402 Acute embolism and thrombosis of unspecified deep veins of left lower extremity: Secondary | ICD-10-CM

## 2017-05-26 DIAGNOSIS — I2782 Chronic pulmonary embolism: Secondary | ICD-10-CM

## 2017-05-26 DIAGNOSIS — Z7901 Long term (current) use of anticoagulants: Secondary | ICD-10-CM

## 2017-05-26 LAB — POCT INR: INR: 3.2

## 2017-05-26 MED ORDER — METHIMAZOLE 10 MG PO TABS: 10.0000 mg | ORAL_TABLET | Freq: Every day | ORAL | 1 refills | Status: DC

## 2017-05-26 MED ORDER — METOPROLOL SUCCINATE ER 25 MG PO TB24: 12.5000 mg | ORAL_TABLET | Freq: Every day | ORAL | 1 refills | Status: DC

## 2017-05-26 MED ORDER — SITAGLIPTIN PHOSPHATE 100 MG PO TABS: 100.0000 mg | ORAL_TABLET | Freq: Every day | ORAL | 1 refills | Status: DC

## 2017-05-26 MED ORDER — ALLOPURINOL 300 MG PO TABS: 300.0000 mg | ORAL_TABLET | Freq: Every day | ORAL | 1 refills | Status: DC

## 2017-05-26 MED ORDER — VERAPAMIL HCL ER 240 MG PO TBCR: 240.0000 mg | EXTENDED_RELEASE_TABLET | Freq: Every day | ORAL | 1 refills | Status: DC

## 2017-05-27 ENCOUNTER — Telehealth: Payer: Self-pay | Admitting: Endocrinology

## 2017-05-27 DIAGNOSIS — E119 Type 2 diabetes mellitus without complications: Secondary | ICD-10-CM | POA: Diagnosis not present

## 2017-05-27 DIAGNOSIS — E0590 Thyrotoxicosis, unspecified without thyrotoxic crisis or storm: Secondary | ICD-10-CM | POA: Diagnosis not present

## 2017-05-27 DIAGNOSIS — Z79899 Other long term (current) drug therapy: Secondary | ICD-10-CM | POA: Diagnosis not present

## 2017-05-27 DIAGNOSIS — I482 Chronic atrial fibrillation: Secondary | ICD-10-CM | POA: Diagnosis not present

## 2017-05-27 NOTE — Telephone Encounter (Signed)
Patient called to report some high blood pressure readings. Patient stated he went to the dentist to have his teeth cleaned and his blood pressure was 241 on the top (could not remember the bottom)when he arrived 5 minutes top number was 185 (could not remember the bottom). Pateint stated he could not get his teethed with his BP this elevated and stated he has not missed his verapamil recently. He denied any chest pain or headaches, but stated he was getting ready to walk in to the ED when I spoke with him and wanted to know if he should be evaluated there. I spoke with Dr. Loanne Drilling verbally on this and he agreed with the patient being seen in th ED and would like the patient to call back and schedule a follow up at the next available. Patient was advised of MD's instructions and had no further questions at this time. Patient will call back to schedule his f/u appointment.

## 2017-05-27 NOTE — Telephone Encounter (Signed)
[facility] called to inquire on if the patient has had any PSA's done recently since 2016. Please call today before 5pm to advise, the patient has an appointment tomorrow morning at 10am with [facility].

## 2017-05-27 NOTE — Telephone Encounter (Signed)
[facility] and was not able to resolve this- I left a message for the nurse to call me back to tell me specifically what is needed

## 2017-05-29 NOTE — Telephone Encounter (Signed)
Spencer English from [facility] returning phone call. Please call her back at [phone].

## 2017-06-02 ENCOUNTER — Telehealth: Payer: Self-pay | Admitting: Endocrinology

## 2017-06-02 ENCOUNTER — Telehealth: Payer: Self-pay | Admitting: Pulmonary Disease

## 2017-06-02 NOTE — Telephone Encounter (Signed)
Spoke with patient-he is aware that he needs OV to discuss wearing/usage of CPAP as well as DL. Pt has been scheduled with [facility] Thursday at 10:45am. Nothing more needed at this time.

## 2017-06-02 NOTE — Telephone Encounter (Signed)
Patient called to say he was at [facility] and they needed to know what his last A1C was. I asked Almyra Free who told me it was 7.6 at his last visit in March. I told the nurse. Nothing further needed.

## 2017-06-03 ENCOUNTER — Telehealth: Payer: Self-pay | Admitting: Pulmonary Disease

## 2017-06-03 NOTE — Telephone Encounter (Signed)
Pt returned call, let him know that he is to bring machine and disc to appoint on tomorrow.Spencer English

## 2017-06-03 NOTE — Telephone Encounter (Signed)
ATC pt, mailbox full unable to leave voicemail. WCB

## 2017-06-03 NOTE — Telephone Encounter (Signed)
lmom tcb x1 to pt  Pt is in [place] but last download is from 11 months ago, spoke with [facility] and they stated pt can bring his machine to their office so we can obtain a 30 day DL.

## 2017-06-04 ENCOUNTER — Encounter: Payer: Self-pay | Admitting: Acute Care

## 2017-06-04 ENCOUNTER — Ambulatory Visit (INDEPENDENT_AMBULATORY_CARE_PROVIDER_SITE_OTHER): Payer: Self-pay | Admitting: Acute Care

## 2017-06-04 DIAGNOSIS — G4733 Obstructive sleep apnea (adult) (pediatric): Secondary | ICD-10-CM

## 2017-06-04 DIAGNOSIS — Z6841 Body Mass Index (BMI) 40.0 and over, adult: Secondary | ICD-10-CM

## 2017-06-04 DIAGNOSIS — E669 Obesity, unspecified: Secondary | ICD-10-CM | POA: Insufficient documentation

## 2017-06-04 DIAGNOSIS — IMO0001 Reserved for inherently not codable concepts without codable children: Secondary | ICD-10-CM

## 2017-06-04 DIAGNOSIS — Z9989 Dependence on other enabling machines and devices: Secondary | ICD-10-CM

## 2017-06-04 NOTE — Patient Instructions (Addendum)
It is nice to meet you.  Call [facility] and see if you qualify for a new machine. Continue on CPAP at bedtime. You appear to be benefiting from the treatment Goal is to wear for at least 6 hours each night for maximal clinical benefit. Continue to work on weight loss, as the link between excess weight  and sleep apnea is well established.  Do not drive if sleepy. Follow up with [facility]  In 12 months  or before as needed.  Please contact office for sooner follow up if symptoms do not improve or worsen or seek emergency care

## 2017-06-04 NOTE — Progress Notes (Signed)
History of Present Illness Spencer English is a 51 y.o. male former smoker quit 2006 with OSA on CPAP. He is folowed by Dr. Elsworth Soho.   06/04/2017 Follow Up for DOT compliance: Pt. Presents for follow up. He is doing well on his CPAP device.He has no daytime sleepiness. He states his quality of sleep is good.He is sleeping through the night. He is compliant 97% of the time. He has no complaints. He denies fever, chest pain, orthopnea, or hemoptysis. He denies leg pain or swelling.  Test Results: Down Load 05/05/2017-06/03/2017 Compliance is 29/30 days ( 97%)  >4 hours is 28 days ( 93% compliant) < 4 hours is 1 day or 3% [employer] 8 cm H2O AHI= 2.3  CBC Latest Ref Rng & Units 03/09/2017 10/19/2015 10/10/2013  WBC 4.0 - 10.5 K/uL 4.8 7.3 6.4  Hemoglobin 13.0 - 17.0 g/dL 14.5 14.8 13.9  Hematocrit 39.0 - 52.0 % 45.0 46.9 42.9  Platelets 150.0 - 400.0 K/uL 220.0 239.0 191.0    BMP Latest Ref Rng & Units 03/09/2017 10/19/2015 10/10/2013  Glucose 70 - 99 mg/dL 142(H) 137(H) 92  BUN 6 - 23 mg/dL 12 8 13   Creatinine 0.40 - 1.50 mg/dL 1.23 1.21 1.2  Sodium 135 - 145 mEq/L 140 141 141  Potassium 3.5 - 5.1 mEq/L 4.2 4.4 4.2  Chloride 96 - 112 mEq/L 104 105 103  CO2 19 - 32 mEq/L 31 29 29   Calcium 8.4 - 10.5 mg/dL 9.1 9.1 9.0   ProBNP    Component Value Date/Time   PROBNP 11,423.0 (H) 01/31/2013 1736      Past medical hx Past Medical History:  Diagnosis Date  . ALLERGIC RHINITIS 04/12/2009  . Atrial fibrillation ([place])   . BACK PAIN, LUMBAR 01/14/2011  . DEEP VENOUS THROMBOPHLEBITIS, LEG, LEFT 05/05/2009  . DM 08/08/2008  . DYSLIPIDEMIA 04/26/2009  . ECZEMA 05/23/2010  . 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 GERD (gastroesophageal reflux disease)   . GOUT 10/24/2010  . Hyperglycemia   . HYPERTENSION 07/21/2007  . 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 Leukopenia   . Long term (current) use of anticoagulants 04/03/2011  . PULMONARY EMBOLISM 05/03/2009  . UNSPECIFIED URINARY CALCULUS 07/04/2010      Social History  Substance Use Topics  . Smoking status: Former Smoker    Types: Cigars    Quit date: 01/07/2005  . Smokeless tobacco: Never Used  . Alcohol use No    Tobacco Cessation: Counseling given: Yes Patient is a former smoker, quit January 10 , 2006  Past surgical hx, Family hx, Social hx all reviewed.  Current Outpatient Prescriptions on File Prior to Visit  Medication Sig  . acetaminophen (TYLENOL) 325 MG tablet Take 2 tablets (650 mg total) by mouth every 6 (six) hours as needed for pain.  Marland Kitchen allopurinol (ZYLOPRIM) 300 MG tablet Take 1 tablet (300 mg total) by mouth at bedtime.  Marland Kitchen atorvastatin (LIPITOR) 20 MG tablet Take 1 tablet (20 mg total) by mouth daily.  . fexofenadine (ALLEGRA) 180 MG tablet Take 180 mg by mouth daily as needed. For allergies.  . fluticasone (FLONASE) 50 MCG/ACT nasal spray Place 2 sprays into both nostrils daily as needed. For allergies.  . halobetasol (ULTRAVATE) 0.05 % cream APPLY CREAM TOPICALLY THREE TIMES DAILY AS NEEDED FOR RASH.  Marland Kitchen KERYDIN 5 % SOLN Pt has not started taking yet (06/26/14)  . methimazole (TAPAZOLE) 10 MG tablet Take 1 tablet (10 mg total) by mouth daily. (Patient taking differently: Take 10 mg by mouth daily.  Pt states he takes every other day)  . metoprolol succinate (TOPROL-XL) 25 MG 24 hr tablet TAKE ONE-HALF TABLET BY MOUTH ONCE DAILY  . verapamil (CALAN-SR) 240 MG CR tablet Take 1 tablet (240 mg total) by mouth daily. (Patient taking differently: Take 240 mg by mouth daily. Pt states he takes 300mg )  . warfarin (COUMADIN) 5 MG tablet Take as directed by Anticoagulation clinic.  . empagliflozin (JARDIANCE) 10 MG TABS tablet Take 10 mg by mouth daily. (Patient not taking: Reported on 06/04/2017)  . sitaGLIPtin (JANUVIA) 100 MG tablet Take 1 tablet (100 mg total) by mouth daily. (Patient not taking: Reported on 06/04/2017)  . terbinafine (LAMISIL) 250 MG tablet Pt has not started taking yet (06/26/14)   No current  facility-administered medications on file prior to visit.      Allergies  Allergen Reactions  . Metformin Diarrhea and Nausea Only  . Other   . Viagra [Sildenafil Citrate] Other (See Comments)    headache    Review Of Systems:  Constitutional:   No  weight loss, night sweats,  Fevers, chills, fatigue, or  lassitude.  HEENT:   No headaches,  Difficulty swallowing,  Tooth/dental problems, or  Sore throat,                No sneezing, itching, ear ache, nasal congestion, post nasal drip,   CV:  No chest pain,  Orthopnea, PND, swelling in lower extremities, anasarca, dizziness, palpitations, syncope.   GI  No heartburn, indigestion, abdominal pain, nausea, vomiting, diarrhea, change in bowel habits, loss of appetite, bloody stools.   Resp: No shortness of breath with exertion or at rest.  No excess mucus, no productive cough,  No non-productive cough,  No coughing up of blood.  No change in color of mucus.  No wheezing.  No chest wall deformity  Skin: no rash or lesions.  GU: no dysuria, change in color of urine, no urgency or frequency.  No flank pain, no hematuria   MS:  No joint pain or swelling.  No decreased range of motion.  No back pain.  Psych:  No change in mood or affect. No depression or anxiety.  No memory loss.   Vital Signs BP 136/80 (BP Location: Left Arm, Patient Position: Sitting, Cuff Size: Normal)   Pulse 66   Ht 5\' 11"  (1.803 m)   Wt 297 lb 9.6 oz (135 kg)   SpO2 96%   BMI 41.51 kg/m    Physical Exam:  General- No distress,  A&Ox3, pleasant affect ENT: No sinus tenderness, TM clear, pale nasal mucosa, no oral exudate,no post nasal drip, no LAN Cardiac: S1, S2, regular rate and rhythm, no murmur Chest: No wheeze/ rales/ dullness; no accessory muscle use, no nasal flaring, no sternal retractions Abd.: Soft Non-tender, nondistended, bowel sounds positive, obese Ext: No clubbing cyanosis, edema Neuro:  normal strength Skin: No rashes, warm and  dry Psych: normal mood and behavior  Body mass index is 41.51 kg/m.   Assessment/Plan  OSA on CPAP Patient is compliant with CPAP usage 29 of 30 days are 97% He is using CPAP for greater than 4 hours 93% of the time AHI is 2.3 Patient is clearly benefiting from CPAP treatment Plan Call Adult Pediatrics and see if you qualify for a new machine. Continue on CPAP at bedtime. You appear to be benefiting from the treatment Goal is to wear for at least 6 hours each night for maximal clinical benefit. Continue to work on weight loss,  as the link between excess weight  and sleep apnea is well established.  Do not drive if sleepy. Follow up with Elsworth Soho  In 12 months  or before as needed.  Please contact office for sooner follow up if symptoms do not improve or worsen or seek emergency care    Obese BMI 41.51 kg/m Plan Continue to work on weight loss    Magdalen Spatz, NP 06/04/2017  8:19 PM

## 2017-06-04 NOTE — Assessment & Plan Note (Signed)
Patient is compliant with CPAP usage 29 of 30 days are 97% He is using CPAP for greater than 4 hours 93% of the time AHI is 2.3 Patient is clearly benefiting from CPAP treatment Plan Call Adult Pediatrics and see if you qualify for a new machine. Continue on CPAP at bedtime. You appear to be benefiting from the treatment Goal is to wear for at least 6 hours each night for maximal clinical benefit. Continue to work on weight loss, as the link between excess weight  and sleep apnea is well established.  Do not drive if sleepy. Follow up with Spencer English  In 12 months  or before as needed.  Please contact office for sooner follow up if symptoms do not improve or worsen or seek emergency care

## 2017-06-04 NOTE — Assessment & Plan Note (Signed)
BMI 41.51 kg/m Plan Continue to work on weight loss

## 2017-06-08 NOTE — Telephone Encounter (Signed)
Spoke with Alleen Borne and gave verbal on most recent PSA and also faxed most recent PSA

## 2017-06-09 NOTE — Progress Notes (Signed)
Reviewed & agree with plan  

## 2017-06-10 ENCOUNTER — Encounter (HOSPITAL_COMMUNITY): Payer: Self-pay | Admitting: *Deleted

## 2017-06-10 ENCOUNTER — Encounter: Payer: Self-pay | Admitting: Surgery

## 2017-06-10 ENCOUNTER — Emergency Department (HOSPITAL_COMMUNITY)
Admission: EM | Admit: 2017-06-10 | Discharge: 2017-06-10 | Disposition: A | Discharge status: Home / Self Care | Payer: BLUE CROSS/BLUE SHIELD | Attending: Emergency Medicine | Admitting: Emergency Medicine

## 2017-06-10 DIAGNOSIS — X509XXA Other and unspecified overexertion or strenuous movements or postures, initial encounter: Secondary | ICD-10-CM | POA: Insufficient documentation

## 2017-06-10 DIAGNOSIS — S39012A Strain of muscle, fascia and tendon of lower back, initial encounter: Secondary | ICD-10-CM | POA: Insufficient documentation

## 2017-06-10 DIAGNOSIS — E039 Hypothyroidism, unspecified: Secondary | ICD-10-CM | POA: Insufficient documentation

## 2017-06-10 DIAGNOSIS — Z7984 Long term (current) use of oral hypoglycemic drugs: Secondary | ICD-10-CM | POA: Insufficient documentation

## 2017-06-10 DIAGNOSIS — Y939 Activity, unspecified: Secondary | ICD-10-CM | POA: Insufficient documentation

## 2017-06-10 DIAGNOSIS — I10 Essential (primary) hypertension: Secondary | ICD-10-CM | POA: Insufficient documentation

## 2017-06-10 DIAGNOSIS — Y999 Unspecified external cause status: Secondary | ICD-10-CM | POA: Insufficient documentation

## 2017-06-10 DIAGNOSIS — S29019A Strain of muscle and tendon of unspecified wall of thorax, initial encounter: Secondary | ICD-10-CM

## 2017-06-10 DIAGNOSIS — Y929 Unspecified place or not applicable: Secondary | ICD-10-CM | POA: Insufficient documentation

## 2017-06-10 DIAGNOSIS — E119 Type 2 diabetes mellitus without complications: Secondary | ICD-10-CM | POA: Insufficient documentation

## 2017-06-10 DIAGNOSIS — Z79899 Other long term (current) drug therapy: Secondary | ICD-10-CM | POA: Insufficient documentation

## 2017-06-10 DIAGNOSIS — Z87891 Personal history of nicotine dependence: Secondary | ICD-10-CM | POA: Insufficient documentation

## 2017-06-10 DIAGNOSIS — Z7901 Long term (current) use of anticoagulants: Secondary | ICD-10-CM | POA: Insufficient documentation

## 2017-06-10 MED ORDER — TRAMADOL HCL 50 MG PO TABS: 100.0000 mg | ORAL_TABLET | Freq: Four times a day (QID) | ORAL | 0 refills | Status: DC | PRN

## 2017-06-10 MED ORDER — ACETAMINOPHEN 500 MG PO TABS: 1000.0000 mg | ORAL_TABLET | Freq: Four times a day (QID) | ORAL | 0 refills | Status: DC | PRN

## 2017-06-10 MED ORDER — CYCLOBENZAPRINE HCL 10 MG PO TABS: 10.0000 mg | ORAL_TABLET | Freq: Three times a day (TID) | ORAL | 0 refills | Status: DC

## 2017-06-10 MED FILL — CYCLOBENZAPRINE 10 MG TABLE: 10 | 10 days supply | Qty: 30 | Fill #0

## 2017-06-10 NOTE — ED Provider Notes (Signed)
[facility] DEPT Provider Note   CSN: [id number] Arrival date & time: 06/10/17  9892     History   Chief Complaint Chief Complaint  Patient presents with  . Back Pain    HPI Spencer English is a 51 y.o. male.  HPI Patient states that 2 weeks ago he was getting into a car and because he had hit the back of his head on the upper rail as he was entering, he tightened up and cringe to his back very abruptly. At that time he felt a pull or strain in his thoracic back slightly to the right. Ever since then, he has been having problems with pain that occurs with certain movements or the area gets tight and feels like it's spasming. He is in and out of the truck and he reports is difficult because he has to reach up and use the bar to get in and out and do quite a bit of twisting movements which exacerbated frequently. He's tried both hot and cold packs and massage without relief. If he turns a certain way he feels a pulling sensation that goes from just right of his center, upper back towards the right shoulder. No pain, numbness or tingling to the arms or legs. No chest pain, no shortness of breath, no abdominal pain, no lower extremity pain or swelling.  Patient reports HIS baseline medical problems have been stable. He has had recent physical examination and monitoring of his INR. He reports he generally feels well and has not had other issues. Past Medical History:  Diagnosis Date  . ALLERGIC RHINITIS 04/12/2009  . Atrial fibrillation ([place])   . BACK PAIN, LUMBAR 01/14/2011  . DEEP VENOUS THROMBOPHLEBITIS, LEG, LEFT 05/05/2009  . DM 08/08/2008  . DYSLIPIDEMIA 04/26/2009  . ECZEMA 05/23/2010  . 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 GERD (gastroesophageal reflux disease)   . GOUT 10/24/2010  . Hyperglycemia   . HYPERTENSION 07/21/2007  . 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 Leukopenia   . Long term (current) use of anticoagulants 04/03/2011  . PULMONARY EMBOLISM 05/03/2009  . UNSPECIFIED URINARY  CALCULUS 07/04/2010    Patient Active Problem List   Diagnosis Date Noted  . OSA on CPAP 06/04/2017  . Obese 06/04/2017  . Wellness examination 09/05/2015  . Screening for cancer 05/15/2015  . Sleep disorder 06/27/2014  . Moderate mitral regurgitation 06/26/2014  . Screening for prostate cancer 10/10/2013  . Pulmonary edema 02/01/2013  . Hyperthyroidism 01/27/2013  . Moderate to severe pulmonary hypertension ([place]) 01/26/2013  . Syncope 01/25/2013  . Microcytic anemia 01/25/2013  . Thrombocytopenia ([place]) 01/25/2013  . Hypotension due to drugs 01/23/2013  . History of septic arthritis 01/23/2013  . Atrial fibrillation ([place])   . Palpitations 11/03/2012  . Encounter for long-term (current) use of other medications 09/06/2012  . Pre-employment examination 10/11/2011  . Screening examination for infectious disease 06/16/2011  . Encounter for long-term (current) use of anticoagulants 04/03/2011  . WHEEZING 01/31/2011  . BACK PAIN, LUMBAR 01/14/2011  . Gout 10/24/2010  . UNSPECIFIED URINARY CALCULUS 07/04/2010  . ECZEMA 05/23/2010  . EDEMA 10/25/2009  . 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 DVT (deep venous thrombosis) ([place]) 05/05/2009  . FOOT PAIN, LEFT 05/05/2009  . 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 DYSLIPIDEMIA 04/26/2009  . ALLERGIC RHINITIS 04/12/2009  . Pain in joint, ankle and foot 04/12/2009  . Diabetes ([place]) 08/08/2008  . 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 SEBACEOUS CYST 01/31/2008  . Essential hypertension 07/21/2007    Past Surgical History:  Procedure Laterality Date  . ANTERIOR CRUCIATE LIGAMENT REPAIR  2000   Right  . CYSTECTOMY     back of head  . KNEE ARTHROSCOPY     left knee  . KNEE ARTHROSCOPY  01/03/2013   Procedure: ARTHROSCOPY KNEE;  Surgeon: Yvette Rack., MD;  Location: [facility];  Service: Orthopedics;  Laterality: Left;  Lavage Synovectomy, Removal of loose body  . KNEE ARTHROSCOPY W/ ACL RECONSTRUCTION     right knee       Home  Medications    Prior to Admission medications   Medication Sig Start Date End Date Taking? Authorizing Provider  acetaminophen (TYLENOL) 325 MG tablet Take 2 tablets (650 mg total) by mouth every 6 (six) hours as needed for pain. 01/27/13   Elvera Lennox, MD  acetaminophen (TYLENOL) 500 MG tablet Take 2 tablets (1,000 mg total) by mouth every 6 (six) hours as needed. 06/10/17   Charlesetta Shanks, MD  allopurinol (ZYLOPRIM) 300 MG tablet Take 1 tablet (300 mg total) by mouth at bedtime. 05/26/17   Renato Shin, MD  atorvastatin (LIPITOR) 20 MG tablet Take 1 tablet (20 mg total) by mouth daily. 05/20/17   Renato Shin, MD  cyclobenzaprine (FLEXERIL) 10 MG tablet Take 1 tablet (10 mg total) by mouth 3 (three) times daily. 06/10/17   Charlesetta Shanks, MD  empagliflozin (JARDIANCE) 10 MG TABS tablet Take 10 mg by mouth daily. Patient not taking: Reported on 06/04/2017 05/20/17   Renato Shin, MD  fexofenadine (ALLEGRA) 180 MG tablet Take 180 mg by mouth daily as needed. For allergies.    [provider]  fluticasone (FLONASE) 50 MCG/ACT nasal spray Place 2 sprays into both nostrils daily as needed. For allergies. 05/20/17   Renato Shin, MD  halobetasol (ULTRAVATE) 0.05 % cream APPLY CREAM TOPICALLY THREE TIMES DAILY AS NEEDED FOR RASH. 05/20/17   Renato Shin, MD  KERYDIN 5 % SOLN Pt has not started taking yet (06/26/14) 04/29/14   [provider]  methimazole (TAPAZOLE) 10 MG tablet Take 1 tablet (10 mg total) by mouth daily. Patient taking differently: Take 10 mg by mouth daily. Pt states he takes every other day 05/26/17   Renato Shin, MD  metoprolol succinate (TOPROL-XL) 25 MG 24 hr tablet TAKE ONE-HALF TABLET BY MOUTH ONCE DAILY 04/10/17   Renato Shin, MD  sitaGLIPtin (JANUVIA) 100 MG tablet Take 1 tablet (100 mg total) by mouth daily. Patient not taking: Reported on 06/04/2017 05/26/17   Renato Shin, MD  terbinafine (LAMISIL) 250 MG tablet Pt has not started taking yet (06/26/14) 04/13/14    [provider]  traMADol (ULTRAM) 50 MG tablet Take 2 tablets (100 mg total) by mouth every 6 (six) hours as needed. 06/10/17   Charlesetta Shanks, MD  verapamil (CALAN-SR) 240 MG CR tablet Take 1 tablet (240 mg total) by mouth daily. Patient taking differently: Take 240 mg by mouth daily. Pt states he takes 300mg  05/26/17   Renato Shin, MD  warfarin (COUMADIN) 5 MG tablet Take as directed by Anticoagulation clinic. 05/22/17   Thompson Grayer, MD    Family History Family History  Problem Relation Age of Onset  . Diabetes Mother   . Hypertension Mother   . Hyperlipidemia Mother   . Diabetes Father   . Hypertension Father   . Hyperlipidemia Father   . Heart attack Father   . Heart disease Father     Social History Social History  Substance Use Topics  . Smoking status: Former Smoker  Types: Cigars    Quit date: 01/07/2005  . Smokeless tobacco: Never Used  . Alcohol use No     Allergies   Metformin; Other; and Viagra [sildenafil citrate]   Review of Systems Review of Systems 10 Systems reviewed and are negative for acute change except as noted in the HPI.   Physical Exam Updated Vital Signs BP (!) 161/103 (BP Location: Left Arm)   Pulse 80   Temp 97.5 F (36.4 C) (Oral)   Resp 18   Ht 5\' 11"  (1.803 m)   Wt 134.7 kg (297 lb)   SpO2 100%   BMI 41.42 kg/m   Physical Exam  Constitutional: He is oriented to person, place, and time. He appears well-developed and well-nourished. No distress.  HENT:  Head: Normocephalic and atraumatic.  Eyes: Conjunctivae are normal.  Neck: Neck supple.  Cardiovascular: Normal rate, regular rhythm, normal heart sounds and intact distal pulses.   No murmur heard. Pulmonary/Chest: Effort normal and breath sounds normal. No respiratory distress.  Abdominal: Soft. He exhibits no distension. There is no tenderness.  Musculoskeletal: Normal range of motion. He exhibits no edema.  Patient does endorse reproducible discomfort to  palpation along the right paraspinous muscle bodies of the thoracic spine from approximately the 6 to T12. He also perceives pulling and straining as he does rotation to the left. No bony point tenderness. No weakness or decreased range of motion of extremities.  Neurological: He is alert and oriented to person, place, and time. No cranial nerve deficit. He exhibits normal muscle tone. Coordination normal.  Skin: Skin is warm and dry.  Psychiatric: He has a normal mood and affect.  Nursing note and vitals reviewed.    ED Treatments / Results  Labs (all labs ordered are listed, but only abnormal results are displayed) Labs Reviewed - No data to display  EKG  EKG Interpretation None       Radiology No results found.  Procedures Procedures (including critical care time)  Medications Ordered in ED Medications - No data to display   Initial Impression / Assessment and Plan / ED Course  I have reviewed the triage vital signs and the nursing notes.  Pertinent labs & imaging results that were available during my care of the patient were reviewed by me and considered in my medical decision making (see chart for details).       Final Clinical Impressions(s) / ED Diagnoses   Final diagnoses:  Thoracic myofascial strain, initial encounter   Patient identifies a specific event that initiated his pain. Pain is off the midline of the spine. No associated neurologic symptoms or radiation other than with twisting motion. No chest pain, no dyspnea, no abdominal pain. Although patient has significant comorbid medical conditions they all appear to be stable at this time and history and physical examination are consistent with musculoskeletal strain. At this time patient will be started on a combination of Flexeril tramadol and acetaminophen. He is advised to follow-up with his PCP to determine if this is relieving his pain. Signs and symptoms for return are reviewed. New Prescriptions New  Prescriptions   ACETAMINOPHEN (TYLENOL) 500 MG TABLET    Take 2 tablets (1,000 mg total) by mouth every 6 (six) hours as needed.   CYCLOBENZAPRINE (FLEXERIL) 10 MG TABLET    Take 1 tablet (10 mg total) by mouth 3 (three) times daily.   TRAMADOL (ULTRAM) 50 MG TABLET    Take 2 tablets (100 mg total) by mouth every 6 (six)  hours as needed.     Charlesetta Shanks, MD 06/10/17 1018

## 2017-06-10 NOTE — ED Triage Notes (Signed)
Patient is alert and oriented x4. He is complaining of mid back pain that started about 2 weeks ago after sitting down in a car wrong.  Currently he rates his pain 6 of 10.

## 2017-06-23 ENCOUNTER — Telehealth: Payer: Self-pay | Admitting: Endocrinology

## 2017-06-23 ENCOUNTER — Telehealth: Payer: Self-pay

## 2017-06-23 ENCOUNTER — Other Ambulatory Visit: Payer: Self-pay

## 2017-06-23 MED ORDER — GLIMEPIRIDE 4 MG PO TABS: 4.0000 mg | ORAL_TABLET | Freq: Every day | ORAL | 11 refills | Status: DC

## 2017-06-23 NOTE — Telephone Encounter (Signed)
Please advise of message.  Thank you!

## 2017-06-23 NOTE — Telephone Encounter (Signed)
Submitted

## 2017-06-23 NOTE — Telephone Encounter (Signed)
Called and LVM advising patient of medication changes, and advised to call back if any questions or on how sugars do.

## 2017-06-23 NOTE — Telephone Encounter (Signed)
Called and notified patient I had submitted the medication to the pharmacy, patient understood and had no questions at this time.

## 2017-06-23 NOTE — Telephone Encounter (Signed)
Called and notified patient RX was submitted to pharmacy.

## 2017-06-23 NOTE — Telephone Encounter (Signed)
glimepiride (AMARYL) 4 MG tablet [[id number]]  Medication sent to the wrong pharmacy. Should be [facility] on [address]

## 2017-06-23 NOTE — Telephone Encounter (Signed)
I have sent a prescription to your pharmacy, to change [place] and jardiance to generic glimepiride.  This is a guess as to how much you need. The rest of your meds are generic.  It is important to shop around for the best prices.

## 2017-06-23 NOTE — Telephone Encounter (Signed)
Patient states he no longer has insurance, and cannot get his medication. Asking for call back to discuss options.  States it has been several days now since he took medication.

## 2017-06-23 NOTE — Telephone Encounter (Signed)
Returning missed call.  -LL

## 2017-07-03 ENCOUNTER — Telehealth: Payer: Self-pay | Admitting: Endocrinology

## 2017-07-03 NOTE — Telephone Encounter (Signed)
Patient called to advise that he is bringing patient assistance paperwork on Monday for the januvia- this is to get 12 months supply.

## 2017-07-06 ENCOUNTER — Telehealth: Payer: Self-pay | Admitting: Endocrinology

## 2017-07-06 NOTE — Telephone Encounter (Signed)
Pt dropped off  [employer]  medication assistance app in envelope for [place] to sign. Placed in physician front office tray.

## 2017-07-07 NOTE — Telephone Encounter (Signed)
Patient would like assistance forms to be completed for Januvia. I reviewed the current med list and Celesta Gentile is not listed? Please advise, Thanks!

## 2017-07-07 NOTE — Telephone Encounter (Signed)
Patient called to check the status of the paperwork for his Januvia. I was advised by Almyra Free that Jinny Blossom has it and will call once it is completed.

## 2017-07-08 NOTE — Telephone Encounter (Signed)
done

## 2017-07-08 NOTE — Telephone Encounter (Signed)
Patient notified letter has been mailed to [employer].

## 2017-07-09 ENCOUNTER — Ambulatory Visit: Payer: BLUE CROSS/BLUE SHIELD | Admitting: Endocrinology

## 2017-07-09 DIAGNOSIS — Z0289 Encounter for other administrative examinations: Secondary | ICD-10-CM

## 2017-07-24 ENCOUNTER — Telehealth: Payer: Self-pay

## 2017-07-24 ENCOUNTER — Telehealth: Payer: Self-pay | Admitting: Endocrinology

## 2017-07-24 MED ORDER — FUROSEMIDE 40 MG PO TABS: 40.0000 mg | ORAL_TABLET | Freq: Every day | ORAL | 1 refills | Status: DC

## 2017-07-24 NOTE — Telephone Encounter (Signed)
Patient states that he could not start the [place] because he does not have insurance, he would rather do the lasix for now because it is cheaper for him until he gets his insurance in September.  He is in need as he has no more medication now.  Thank you!

## 2017-07-24 NOTE — Telephone Encounter (Signed)
This med was d/c'ed, as farxiga was started.  Ov is needed next available.

## 2017-07-24 NOTE — Telephone Encounter (Signed)
Okay to increase to 40 mg Lasix?? We need before the end of the day. Thanks!

## 2017-07-24 NOTE — Addendum Note (Signed)
Addended by: Renato Shin on: 07/24/2017 04:42 PM   Modules accepted: Orders

## 2017-07-24 NOTE — Telephone Encounter (Signed)
**  Remind patient they can make refill requests via MyChart**  Medication refill request (Name & Dosage): Furosemide 20MG    Preferred pharmacy (Name & Address):   [facility] ([address]  - [place] [phone] (Phone) [phone] (Fax)     Other comments (if applicable):   Patient would like to make this a 40MG . (Could not find on med list)  Patient needs prior to 07/26/17 due to going out of town.  Please call patient prior to refilling Rx at [phone]

## 2017-07-24 NOTE — Telephone Encounter (Signed)
Ok, I have sent a prescription to your pharmacy 

## 2017-07-24 NOTE — Telephone Encounter (Signed)
Patient awaiting a call from Spencer English to advise on the note below. Call patient before filling the rx.

## 2017-07-26 ENCOUNTER — Other Ambulatory Visit: Payer: Self-pay | Admitting: Endocrinology

## 2017-07-27 ENCOUNTER — Other Ambulatory Visit: Payer: Self-pay

## 2017-07-27 MED ORDER — FUROSEMIDE 40 MG PO TABS: 40.0000 mg | ORAL_TABLET | Freq: Every day | ORAL | 1 refills | Status: DC

## 2017-07-27 NOTE — Telephone Encounter (Signed)
Patient notified lasix has been increased to 40 mg via voicemail.

## 2017-07-29 ENCOUNTER — Telehealth: Payer: Self-pay | Admitting: Pharmacist Clinician (PhC)/ Clinical Pharmacy Specialist

## 2017-07-29 NOTE — Telephone Encounter (Signed)
LMOM for patient to call - has been checked at [facility]

## 2017-08-05 ENCOUNTER — Telehealth: Payer: Self-pay | Admitting: Endocrinology

## 2017-08-05 NOTE — Telephone Encounter (Signed)
LMOM x 3 to schedule appt.  Pt actually seen only at [facility] in past

## 2017-08-05 NOTE — Telephone Encounter (Signed)
LM for pt to let him know the forms would not be able to be completed until next week upon Dr. Cordelia Pen arrival

## 2017-08-05 NOTE — Telephone Encounter (Signed)
Patient called in reference to wife bringing in form today to be signed by Loanne Drilling. Patient stated Loanne Drilling already signed the form but it needs to be signed in "Section 2" as well.   Patient would like to speak with [facility]. Please call patient and advise at [phone]

## 2017-08-06 NOTE — Telephone Encounter (Signed)
Please call patient back tomorrow to advise.

## 2017-08-07 NOTE — Telephone Encounter (Signed)
Routing to you °

## 2017-08-07 NOTE — Telephone Encounter (Signed)
Patient would like a call back from [place].  He wants his form signed Monday and wants Mardene Celeste to make sure it's not lost in the office. He wants whoever calls to leave a detailed message if he misses it.  Ty,  -LL

## 2017-08-07 NOTE — Telephone Encounter (Signed)
Spencer English spoke with PT yesterday & stated that Dr. Loanne Drilling would be back on Monday. Dr. Loanne Drilling would have to sign his form because a stamp wasn't accepted. He would be called when form was signed.

## 2017-08-10 NOTE — Telephone Encounter (Signed)
Called PT & left VM that his form was signed ready to be picked up.

## 2017-09-16 ENCOUNTER — Telehealth: Payer: Self-pay | Admitting: Endocrinology

## 2017-09-16 NOTE — Telephone Encounter (Signed)
Patient needs to know what medication replaced his furosemide, he needs a refill on it? Call patient to advise, okay to leave a detailed message on phone.

## 2017-09-17 ENCOUNTER — Other Ambulatory Visit: Payer: Self-pay

## 2017-09-17 MED ORDER — FUROSEMIDE 40 MG PO TABS: 40.0000 mg | ORAL_TABLET | Freq: Every day | ORAL | 1 refills | Status: DC

## 2017-09-17 NOTE — Telephone Encounter (Signed)
Called patient and refilled furosemide as well as made him f/u appt. For 10/10.

## 2017-10-07 ENCOUNTER — Encounter: Payer: Self-pay | Admitting: Endocrinology

## 2017-10-07 ENCOUNTER — Ambulatory Visit (INDEPENDENT_AMBULATORY_CARE_PROVIDER_SITE_OTHER): Payer: 59 | Admitting: *Deleted

## 2017-10-07 ENCOUNTER — Ambulatory Visit (INDEPENDENT_AMBULATORY_CARE_PROVIDER_SITE_OTHER): Payer: 59 | Admitting: Endocrinology

## 2017-10-07 VITALS — BP 134/84 | HR 72 | Wt 282.0 lb

## 2017-10-07 DIAGNOSIS — E119 Type 2 diabetes mellitus without complications: Secondary | ICD-10-CM | POA: Diagnosis not present

## 2017-10-07 DIAGNOSIS — Z0000 Encounter for general adult medical examination without abnormal findings: Secondary | ICD-10-CM | POA: Diagnosis not present

## 2017-10-07 DIAGNOSIS — Z7901 Long term (current) use of anticoagulants: Secondary | ICD-10-CM

## 2017-10-07 DIAGNOSIS — E0590 Thyrotoxicosis, unspecified without thyrotoxic crisis or storm: Secondary | ICD-10-CM | POA: Diagnosis not present

## 2017-10-07 DIAGNOSIS — I2782 Chronic pulmonary embolism: Secondary | ICD-10-CM

## 2017-10-07 DIAGNOSIS — Z5181 Encounter for therapeutic drug level monitoring: Secondary | ICD-10-CM | POA: Diagnosis not present

## 2017-10-07 DIAGNOSIS — I82402 Acute embolism and thrombosis of unspecified deep veins of left lower extremity: Secondary | ICD-10-CM

## 2017-10-07 LAB — POCT INR: INR: 2.1

## 2017-10-07 LAB — POCT GLYCOSYLATED HEMOGLOBIN (HGB A1C): HEMOGLOBIN A1C: 6.4

## 2017-10-07 LAB — T4, FREE: FREE T4: 0.93 ng/dL (ref 0.60–1.60)

## 2017-10-07 LAB — TSH: TSH: 1.37 u[IU]/mL (ref 0.35–4.50)

## 2017-10-07 MED ORDER — HALOBETASOL PROPIONATE 0.05 % EX CREA: TOPICAL_CREAM | CUTANEOUS | 3 refills | Status: DC

## 2017-10-07 MED ORDER — VERAPAMIL HCL ER 300 MG PO CP24: 1.0000 | ORAL_CAPSULE | Freq: Every day | ORAL | 11 refills | Status: DC

## 2017-10-07 MED ORDER — SITAGLIPTIN PHOSPHATE 100 MG PO TABS: 100.0000 mg | ORAL_TABLET | Freq: Every day | ORAL | 11 refills | Status: DC

## 2017-10-07 MED ORDER — ATORVASTATIN CALCIUM 20 MG PO TABS: 20.0000 mg | ORAL_TABLET | Freq: Every day | ORAL | 0 refills | Status: DC

## 2017-10-07 NOTE — Progress Notes (Signed)
Subjective:    Patient ID: Spencer English, male    DOB: September 09, 1966, 51 y.o.   MRN: [id number]  HPI Pt returns for f/u of diabetes mellitus: DM type: 2 Dx'ed: 1245 Complications: renal insufficiency Therapy: 2 oral meds DKA: never Severe hypoglycemia: never Pancreatitis: never Other: he did not tolerate metformin-XR (GI sxs);he has never been on insulin  Interval history: he does not check cbg's.  He has regained health insurance.  He takes [place] only He take tapazole 10 mg qd.  He has slight tremor of the hands. Edema persists, despite lasix.  He does not take lipitor.  Past Medical History:  Diagnosis Date  . ALLERGIC RHINITIS 04/12/2009  . Atrial fibrillation ([place])   . BACK PAIN, LUMBAR 01/14/2011  . DEEP VENOUS THROMBOPHLEBITIS, LEG, LEFT 05/05/2009  . DM 08/08/2008  . DYSLIPIDEMIA 04/26/2009  . ECZEMA 05/23/2010  . 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 GERD (gastroesophageal reflux disease)   . GOUT 10/24/2010  . Hyperglycemia   . HYPERTENSION 07/21/2007  . 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 Leukopenia   . Long term (current) use of anticoagulants 04/03/2011  . PULMONARY EMBOLISM 05/03/2009  . UNSPECIFIED URINARY CALCULUS 07/04/2010    Past Surgical History:  Procedure Laterality Date  . ANTERIOR CRUCIATE LIGAMENT REPAIR  2000   Right  . CYSTECTOMY     back of head  . KNEE ARTHROSCOPY     left knee  . KNEE ARTHROSCOPY  01/03/2013   Procedure: ARTHROSCOPY KNEE;  Surgeon: Yvette Rack., MD;  Location: [facility];  Service: Orthopedics;  Laterality: Left;  Lavage Synovectomy, Removal of loose body  . KNEE ARTHROSCOPY W/ ACL RECONSTRUCTION     right knee    Social History   Social History  . Marital status: Legally Separated    Spouse name: N/A  . Number of children: 3  . Years of education: N/A   Occupational History  . [employer]  .  [place] History Main Topics  . Smoking status: Former Smoker    Types: Cigars    Quit  date: 01/07/2005  . Smokeless tobacco: Never Used  . Alcohol use No  . Drug use: No  . Sexual activity: Not on file   Other Topics Concern  . Not on file   Social History Narrative  . No narrative on file    Current Outpatient Prescriptions on File Prior to Visit  Medication Sig Dispense Refill  . allopurinol (ZYLOPRIM) 300 MG tablet Take 1 tablet (300 mg total) by mouth at bedtime. 90 tablet 1  . cyclobenzaprine (FLEXERIL) 10 MG tablet Take 1 tablet (10 mg total) by mouth 3 (three) times daily. 30 tablet 0  . fexofenadine (ALLEGRA) 180 MG tablet Take 180 mg by mouth daily as needed. For allergies.    . fluticasone (FLONASE) 50 MCG/ACT nasal spray Place 2 sprays into both nostrils daily as needed. For allergies. 16 g 11  . furosemide (LASIX) 40 MG tablet Take 1 tablet (40 mg total) by mouth daily. 30 tablet 1  . KERYDIN 5 % SOLN Pt has not started taking yet (06/26/14)    . methimazole (TAPAZOLE) 10 MG tablet Take 1 tablet (10 mg total) by mouth daily. (Patient taking differently: Take 10 mg by mouth daily. Pt states he takes every other day) 90 tablet 1  . metoprolol succinate (TOPROL-XL) 25 MG 24 hr tablet TAKE ONE-HALF TABLET BY MOUTH ONCE DAILY 15 tablet 4  . terbinafine (  LAMISIL) 250 MG tablet Pt has not started taking yet (06/26/14)    . traMADol (ULTRAM) 50 MG tablet Take 2 tablets (100 mg total) by mouth every 6 (six) hours as needed. 20 tablet 0  . warfarin (COUMADIN) 5 MG tablet Take as directed by Anticoagulation clinic. 150 tablet 0   No current facility-administered medications on file prior to visit.     Allergies  Allergen Reactions  . Metformin Diarrhea and Nausea Only  . Other   . Viagra [Sildenafil Citrate] Other (See Comments)    headache    Family History  Problem Relation Age of Onset  . Diabetes Mother   . Hypertension Mother   . Hyperlipidemia Mother   . Diabetes Father   . Hypertension Father   . Hyperlipidemia Father   . Heart attack Father   .  Heart disease Father     BP 134/84   Pulse 72   Wt 282 lb (127.9 kg)   SpO2 98%   BMI 39.33 kg/m   Review of Systems Denies sob and fever    Objective:   Physical Exam VITAL SIGNS:  See vs page GENERAL: no distress NECK: There is slight palpable thyroid enlargement.  No thyroid nodule is palpable.  No palpable lymphadenopathy at the anterior neck. Pulses: foot pulses are intact bilaterally.   MSK: no deformity of the feet or ankles.  CV: 1+ bilat edema of the legs, and bilat vv's Skin:  no ulcer on the feet or ankles.  normal color and temp on the feet and ankles.  Neuro: sensation is intact to touch on the feet and ankles.   Ext: There is bilateral onychomycosis of the toenails.   Lab Results  Component Value Date   HGBA1C 6.4 10/07/2017      Assessment & Plan:  Dyslipidemia: therapy limited by noncompliance.  i'll do the best i can. Hyperthyroidism: due for recheck. Type 2 DM: well-controlled Edema: stable on lasix.    Patient Instructions  blood tests are requested for you today.  We'll let you know about the results. Please continue the same furosemide and januvia.  I have sent a prescription to your pharmacy, to resume the lipitor.  Please come back for a follow-up appointment in 4 months.

## 2017-10-07 NOTE — Patient Instructions (Addendum)
blood tests are requested for you today.  We'll let you know about the results. Please continue the same furosemide and januvia.  I have sent a prescription to your pharmacy, to resume the lipitor.  Please come back for a follow-up appointment in 4 months.

## 2017-10-08 ENCOUNTER — Telehealth: Payer: Self-pay | Admitting: Endocrinology

## 2017-10-08 NOTE — Telephone Encounter (Signed)
Calling for results of lab results

## 2017-10-09 NOTE — Telephone Encounter (Signed)
Patient notified

## 2017-11-10 ENCOUNTER — Encounter: Payer: Self-pay | Admitting: Gastroenterology

## 2017-12-05 ENCOUNTER — Encounter (HOSPITAL_COMMUNITY): Payer: Self-pay | Admitting: Emergency Medicine

## 2017-12-05 ENCOUNTER — Emergency Department (HOSPITAL_COMMUNITY)
Admission: EM | Admit: 2017-12-05 | Discharge: 2017-12-05 | Disposition: A | Discharge status: Home / Self Care | Payer: 59 | Attending: Emergency Medicine | Admitting: Emergency Medicine

## 2017-12-05 DIAGNOSIS — Z7901 Long term (current) use of anticoagulants: Secondary | ICD-10-CM | POA: Insufficient documentation

## 2017-12-05 DIAGNOSIS — Z794 Long term (current) use of insulin: Secondary | ICD-10-CM | POA: Insufficient documentation

## 2017-12-05 DIAGNOSIS — E119 Type 2 diabetes mellitus without complications: Secondary | ICD-10-CM | POA: Insufficient documentation

## 2017-12-05 DIAGNOSIS — Z87891 Personal history of nicotine dependence: Secondary | ICD-10-CM | POA: Diagnosis not present

## 2017-12-05 DIAGNOSIS — Z86711 Personal history of pulmonary embolism: Secondary | ICD-10-CM | POA: Diagnosis not present

## 2017-12-05 DIAGNOSIS — I4891 Unspecified atrial fibrillation: Secondary | ICD-10-CM | POA: Insufficient documentation

## 2017-12-05 DIAGNOSIS — Z79899 Other long term (current) drug therapy: Secondary | ICD-10-CM | POA: Diagnosis not present

## 2017-12-05 DIAGNOSIS — I10 Essential (primary) hypertension: Secondary | ICD-10-CM | POA: Diagnosis present

## 2017-12-05 LAB — CBC WITH DIFFERENTIAL/PLATELET
BASOS PCT: 1 %
Basophils Absolute: 0.1 10*3/uL (ref 0.0–0.1)
Eosinophils Absolute: 0.2 10*3/uL (ref 0.0–0.7)
Eosinophils Relative: 3 %
HEMATOCRIT: 44.2 % (ref 39.0–52.0)
HEMOGLOBIN: 14.7 g/dL (ref 13.0–17.0)
LYMPHS ABS: 1.4 10*3/uL (ref 0.7–4.0)
Lymphocytes Relative: 27 %
MCH: 27.9 pg (ref 26.0–34.0)
MCHC: 33.3 g/dL (ref 30.0–36.0)
MCV: 84 fL (ref 78.0–100.0)
MONO ABS: 0.4 10*3/uL (ref 0.1–1.0)
MONOS PCT: 7 %
NEUTROS ABS: 3.3 10*3/uL (ref 1.7–7.7)
NEUTROS PCT: 62 %
Platelets: 185 10*3/uL (ref 150–400)
RBC: 5.26 MIL/uL (ref 4.22–5.81)
RDW: 16.4 % — AB (ref 11.5–15.5)
WBC: 5.4 10*3/uL (ref 4.0–10.5)

## 2017-12-05 LAB — COMPREHENSIVE METABOLIC PANEL
ALBUMIN: 3.8 g/dL (ref 3.5–5.0)
ALK PHOS: 76 U/L (ref 38–126)
ALT: 19 U/L (ref 17–63)
ANION GAP: 9 (ref 5–15)
AST: 19 U/L (ref 15–41)
BILIRUBIN TOTAL: 1.2 mg/dL (ref 0.3–1.2)
BUN: 6 mg/dL (ref 6–20)
CO2: 25 mmol/L (ref 22–32)
Calcium: 8.4 mg/dL — ABNORMAL LOW (ref 8.9–10.3)
Chloride: 103 mmol/L (ref 101–111)
Creatinine, Ser: 1.09 mg/dL (ref 0.61–1.24)
Glucose, Bld: 166 mg/dL — ABNORMAL HIGH (ref 65–99)
POTASSIUM: 3.5 mmol/L (ref 3.5–5.1)
Sodium: 137 mmol/L (ref 135–145)
TOTAL PROTEIN: 7.4 g/dL (ref 6.5–8.1)

## 2017-12-05 MED ORDER — HYDRALAZINE HCL 10 MG PO TABS: 10.0000 mg | ORAL_TABLET | Freq: Three times a day (TID) | ORAL | 0 refills | Status: DC

## 2017-12-05 MED ORDER — CLONIDINE HCL 0.1 MG PO TABS
0.1000 mg | ORAL_TABLET | Freq: Once | ORAL | Status: AC
  Administered 2017-12-05: 0.1 mg via ORAL
  Filled 2017-12-05: qty 1

## 2017-12-05 MED ORDER — LORAZEPAM 1 MG PO TABS
1.0000 mg | ORAL_TABLET | Freq: Once | ORAL | Status: DC
  Filled 2017-12-05: qty 1

## 2017-12-05 MED ORDER — HYDRALAZINE HCL 20 MG/ML IJ SOLN
5.0000 mg | Freq: Once | INTRAMUSCULAR | Status: AC
  Administered 2017-12-05: 5 mg via INTRAVENOUS
  Filled 2017-12-05: qty 1

## 2017-12-05 NOTE — ED Triage Notes (Signed)
Patient here from home with complaints of hypertension. States that he took his normal BP med this morning. Reports " I can feel when my BP is up". Ambulatory. Denies chest pain.

## 2017-12-05 NOTE — ED Provider Notes (Signed)
[facility] DEPT Provider Note   CSN: [id number] Arrival date & time: 12/05/17  1432     History   Chief Complaint Chief Complaint  Patient presents with  . Hypertension    HPI Spencer English is a 51 y.o. male.  Patient complains of some dizziness headache and poorly controlled blood pressure.   The history is provided by the patient.  Hypertension  This is a recurrent problem. The current episode started more than 1 week ago. The problem occurs constantly. The problem has not changed since onset.Pertinent negatives include no chest pain, no abdominal pain and no headaches. Nothing aggravates the symptoms. Nothing relieves the symptoms.    Past Medical History:  Diagnosis Date  . ALLERGIC RHINITIS 04/12/2009  . Atrial fibrillation ([place])   . BACK PAIN, LUMBAR 01/14/2011  . DEEP VENOUS THROMBOPHLEBITIS, LEG, LEFT 05/05/2009  . DM 08/08/2008  . DYSLIPIDEMIA 04/26/2009  . ECZEMA 05/23/2010  . 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 GERD (gastroesophageal reflux disease)   . GOUT 10/24/2010  . Hyperglycemia   . HYPERTENSION 07/21/2007  . 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 Leukopenia   . Long term (current) use of anticoagulants 04/03/2011  . PULMONARY EMBOLISM 05/03/2009  . UNSPECIFIED URINARY CALCULUS 07/04/2010    Patient Active Problem List   Diagnosis Date Noted  . OSA on CPAP 06/04/2017  . Obese 06/04/2017  . Wellness examination 09/05/2015  . Screening for cancer 05/15/2015  . Sleep disorder 06/27/2014  . Moderate mitral regurgitation 06/26/2014  . Screening for prostate cancer 10/10/2013  . Pulmonary edema 02/01/2013  . Hyperthyroidism 01/27/2013  . Moderate to severe pulmonary hypertension ([place]) 01/26/2013  . Syncope 01/25/2013  . Microcytic anemia 01/25/2013  . Thrombocytopenia ([place]) 01/25/2013  . Hypotension due to drugs 01/23/2013  . History of septic arthritis 01/23/2013  . Atrial fibrillation ([place])   . Palpitations 11/03/2012  .  Encounter for long-term (current) use of other medications 09/06/2012  . Pre-employment examination 10/11/2011  . Screening examination for infectious disease 06/16/2011  . Encounter for long-term (current) use of anticoagulants 04/03/2011  . WHEEZING 01/31/2011  . BACK PAIN, LUMBAR 01/14/2011  . Gout 10/24/2010  . UNSPECIFIED URINARY CALCULUS 07/04/2010  . ECZEMA 05/23/2010  . EDEMA 10/25/2009  . 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 DVT (deep venous thrombosis) ([place]) 05/05/2009  . FOOT PAIN, LEFT 05/05/2009  . 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 DYSLIPIDEMIA 04/26/2009  . ALLERGIC RHINITIS 04/12/2009  . Pain in joint, ankle and foot 04/12/2009  . Diabetes ([place]) 08/08/2008  . 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 SEBACEOUS CYST 01/31/2008  . Essential hypertension 07/21/2007    Past Surgical History:  Procedure Laterality Date  . ANTERIOR CRUCIATE LIGAMENT REPAIR  2000   Right  . CYSTECTOMY     back of head  . KNEE ARTHROSCOPY     left knee  . KNEE ARTHROSCOPY  01/03/2013   Procedure: ARTHROSCOPY KNEE;  Surgeon: Yvette Rack., MD;  Location: [facility];  Service: Orthopedics;  Laterality: Left;  Lavage Synovectomy, Removal of loose body  . KNEE ARTHROSCOPY W/ ACL RECONSTRUCTION     right knee       Home Medications    Prior to Admission medications   Medication Sig Start Date End Date Taking? Authorizing Provider  cyclobenzaprine (FLEXERIL) 10 MG tablet Take 1 tablet (10 mg total) by mouth 3 (three) times daily. 06/10/17  Yes Charlesetta Shanks, MD  furosemide (LASIX) 40 MG tablet Take 1 tablet (40 mg total) by  mouth daily. 09/17/17  Yes Renato Shin, MD  methimazole (TAPAZOLE) 10 MG tablet Take 1 tablet (10 mg total) by mouth daily. Patient taking differently: Take 10 mg by mouth daily. Pt states he takes every other day 05/26/17  Yes Renato Shin, MD  metoprolol succinate (TOPROL-XL) 25 MG 24 hr tablet TAKE ONE-HALF TABLET BY MOUTH ONCE DAILY 04/10/17  Yes Renato Shin, MD  naphazoline-pheniramine (NAPHCON-A) 0.025-0.3 % ophthalmic solution Place 1 drop into both eyes 4 (four) times daily as needed for eye irritation.   Yes [provider]  sitaGLIPtin (JANUVIA) 100 MG tablet Take 1 tablet (100 mg total) by mouth daily. 10/07/17  Yes Renato Shin, MD  Verapamil HCl CR 300 MG CP24 Take 1 capsule (300 mg total) by mouth daily. 10/07/17  Yes Renato Shin, MD  warfarin (COUMADIN) 5 MG tablet Take as directed by Anticoagulation clinic. Patient taking differently: Take 7.5-10 mg by mouth daily. 10 mg M, W, F, Sat, Sun, 7.5 mg Tu, Th 05/22/17  Yes Thompson Grayer, MD  allopurinol (ZYLOPRIM) 300 MG tablet Take 1 tablet (300 mg total) by mouth at bedtime. Patient not taking: Reported on 12/05/2017 05/26/17   Renato Shin, MD  atorvastatin (LIPITOR) 20 MG tablet Take 1 tablet (20 mg total) by mouth daily. Patient not taking: Reported on 12/05/2017 10/07/17   Renato Shin, MD  fluticasone [facility]) 50 MCG/ACT nasal spray Place 2 sprays into both nostrils daily as needed. For allergies. Patient not taking: Reported on 12/05/2017 05/20/17   Renato Shin, MD  halobetasol (ULTRAVATE) 0.05 % cream APPLY CREAM TOPICALLY THREE TIMES DAILY AS NEEDED FOR RASH. Patient not taking: Reported on 12/05/2017 10/07/17   Renato Shin, MD  hydrALAZINE (APRESOLINE) 10 MG tablet Take 1 tablet (10 mg total) by mouth 3 (three) times daily. 12/05/17   Milton Ferguson, MD  terbinafine (LAMISIL) 250 MG tablet Pt has not started taking yet (06/26/14) 04/13/14   [provider]  traMADol (ULTRAM) 50 MG tablet Take 2 tablets (100 mg total) by mouth every 6 (six) hours as needed. Patient not taking: Reported on 12/05/2017 06/10/17   Charlesetta Shanks, MD    Family History Family History  Problem Relation Age of Onset  . Diabetes Mother   . Hypertension Mother   . Hyperlipidemia Mother   . Diabetes Father   . Hypertension Father   . Hyperlipidemia Father   . Heart attack Father     . Heart disease Father     Social History Social History   Tobacco Use  . Smoking status: Former Smoker    Types: Cigars    Last attempt to quit: 01/07/2005    Years since quitting: 12.9  . Smokeless tobacco: Never Used  Substance Use Topics  . Alcohol use: No  . Drug use: No     Allergies   Metformin and Viagra [sildenafil citrate]   Review of Systems Review of Systems  Constitutional: Negative for appetite change and fatigue.  HENT: Negative for congestion, ear discharge and sinus pressure.   Eyes: Negative for discharge.  Respiratory: Negative for cough.   Cardiovascular: Negative for chest pain.  Gastrointestinal: Negative for abdominal pain and diarrhea.  Genitourinary: Negative for frequency and hematuria.  Musculoskeletal: Negative for back pain.  Skin: Negative for rash.  Neurological: Positive for dizziness. Negative for seizures and headaches.  Psychiatric/Behavioral: Negative for hallucinations.     Physical Exam Updated Vital Signs BP (!) 163/101   Pulse 83   Temp 98.1 F (36.7 C)  Resp 18   SpO2 97%   Physical Exam  Constitutional: He is oriented to person, place, and time. He appears well-developed.  HENT:  Head: Normocephalic.  Eyes: Conjunctivae and EOM are normal. No scleral icterus.  Neck: Neck supple. No thyromegaly present.  Cardiovascular: Normal rate and regular rhythm. Exam reveals no gallop and no friction rub.  No murmur heard. Pulmonary/Chest: No stridor. He has no wheezes. He has no rales. He exhibits no tenderness.  Abdominal: He exhibits no distension. There is no tenderness. There is no rebound.  Musculoskeletal: Normal range of motion. He exhibits no edema.  Lymphadenopathy:    He has no cervical adenopathy.  Neurological: He is oriented to person, place, and time. He exhibits normal muscle tone. Coordination normal.  Skin: No rash noted. No erythema.  Psychiatric: He has a normal mood and affect. His behavior is normal.      ED Treatments / Results  Labs (all labs ordered are listed, but only abnormal results are displayed) Labs Reviewed  CBC WITH DIFFERENTIAL/PLATELET - Abnormal; Notable for the following components:      Result Value   RDW 16.4 (*)    All other components within normal limits  COMPREHENSIVE METABOLIC PANEL - Abnormal; Notable for the following components:   Glucose, Bld 166 (*)    Calcium 8.4 (*)    All other components within normal limits    EKG  EKG Interpretation  Date/Time:  Saturday December 05 2017 18:34:17 EST Ventricular Rate:  78 PR Interval:    QRS Duration: 93 QT Interval:  396 QTC Calculation: 452 R Axis:   -20 Text Interpretation:  Sinus rhythm Left ventricular hypertrophy Nonspecific T abnormalities, lateral leads Confirmed by Milton Ferguson [id number]) on 12/05/2017 9:48:00 PM       Radiology No results found.  Procedures Procedures (including critical care time)  Medications Ordered in ED Medications  cloNIDine (CATAPRES) tablet 0.1 mg (0.1 mg Oral Given 12/05/17 1843)  hydrALAZINE (APRESOLINE) injection 5 mg (5 mg Intravenous Given 12/05/17 2044)     Initial Impression / Assessment and Plan / ED Course  I have reviewed the triage vital signs and the nursing notes.  Pertinent labs & imaging results that were available during my care of the patient were reviewed by me and considered in my medical decision making (see chart for details).   Patient poorly controlled blood pressure.  We will start him on some hydralazine and this week    Final Clinical Impressions(s) / ED Diagnoses   Final diagnoses:  Essential hypertension    ED Discharge Orders        Ordered    hydrALAZINE (APRESOLINE) 10 MG tablet  3 times daily     12/05/17 2149       Milton Ferguson, MD 12/05/17 2152

## 2017-12-05 NOTE — Discharge Instructions (Signed)
Follow up with your md in 1 week °

## 2017-12-10 ENCOUNTER — Encounter: Payer: Self-pay | Admitting: Endocrinology

## 2017-12-10 ENCOUNTER — Ambulatory Visit: Payer: BLUE CROSS/BLUE SHIELD | Admitting: Endocrinology

## 2017-12-10 VITALS — BP 164/110 | HR 97 | Wt 285.8 lb

## 2017-12-10 DIAGNOSIS — E119 Type 2 diabetes mellitus without complications: Secondary | ICD-10-CM

## 2017-12-10 LAB — POCT GLYCOSYLATED HEMOGLOBIN (HGB A1C): Hemoglobin A1C: 7

## 2017-12-10 MED ORDER — LOSARTAN POTASSIUM 100 MG PO TABS: 100.0000 mg | ORAL_TABLET | Freq: Every day | ORAL | 11 refills | Status: DC

## 2017-12-10 NOTE — Progress Notes (Signed)
Subjective:    Patient ID: Spencer English, male    DOB: Apr 02, 1966, 51 y.o.   MRN: [id number]  HPI Pt returns for f/u of diabetes mellitus: DM type: 2 Dx'ed: 2355 Complications: renal insufficiency Therapy: 2 oral meds DKA: never Severe hypoglycemia: never Pancreatitis: never Other: he did not tolerate metformin-XR (GI sxs);he has never been on insulin.  Interval history: he does not check cbg's.  He has regained health insurance.  He takes [place] only.  He was seen in ER last week for HTN, and hydralazine was added.  Past Medical History:  Diagnosis Date  . ALLERGIC RHINITIS 04/12/2009  . Atrial fibrillation ([place])   . BACK PAIN, LUMBAR 01/14/2011  . DEEP VENOUS THROMBOPHLEBITIS, LEG, LEFT 05/05/2009  . DM 08/08/2008  . DYSLIPIDEMIA 04/26/2009  . ECZEMA 05/23/2010  . 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 GERD (gastroesophageal reflux disease)   . GOUT 10/24/2010  . Hyperglycemia   . HYPERTENSION 07/21/2007  . 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 Leukopenia   . Long term (current) use of anticoagulants 04/03/2011  . PULMONARY EMBOLISM 05/03/2009  . UNSPECIFIED URINARY CALCULUS 07/04/2010    Past Surgical History:  Procedure Laterality Date  . ANTERIOR CRUCIATE LIGAMENT REPAIR  2000   Right  . CYSTECTOMY     back of head  . KNEE ARTHROSCOPY     left knee  . KNEE ARTHROSCOPY  01/03/2013   Procedure: ARTHROSCOPY KNEE;  Surgeon: Yvette Rack., MD;  Location: [facility];  Service: Orthopedics;  Laterality: Left;  Lavage Synovectomy, Removal of loose body  . KNEE ARTHROSCOPY W/ ACL RECONSTRUCTION     right knee    Social History   Socioeconomic History  . Marital status: Legally Separated    Spouse name: Not on file  . Number of children: 3  . Years of education: Not on file  . Highest education level: Not on file  Social Needs  . Financial resource strain: Not on file  . Food insecurity - worry: Not on file  . Food insecurity - inability: Not on file  . Transportation needs -  medical: Not on file  . Transportation needs - non-medical: Not on file  Occupational History  . Occupation: Truck Education administrator: [employer]    Employer: [employer]  Tobacco Use  . Smoking status: Former Smoker    Types: Cigars    Last attempt to quit: 01/07/2005    Years since quitting: 12.9  . Smokeless tobacco: Never Used  Substance and Sexual Activity  . Alcohol use: No  . Drug use: No  . Sexual activity: Not on file  Other Topics Concern  . Not on file  Social History Narrative  . Not on file    Current Outpatient Medications on File Prior to Visit  Medication Sig Dispense Refill  . allopurinol (ZYLOPRIM) 300 MG tablet Take 1 tablet (300 mg total) by mouth at bedtime. (Patient not taking: Reported on 12/05/2017) 90 tablet 1  . atorvastatin (LIPITOR) 20 MG tablet Take 1 tablet (20 mg total) by mouth daily. (Patient not taking: Reported on 12/05/2017) 90 tablet 0  . cyclobenzaprine (FLEXERIL) 10 MG tablet Take 1 tablet (10 mg total) by mouth 3 (three) times daily. 30 tablet 0  . fluticasone (FLONASE) 50 MCG/ACT nasal spray Place 2 sprays into both nostrils daily as needed. For allergies. (Patient not taking: Reported on 12/05/2017) 16 g 11  . furosemide (LASIX) 40 MG tablet Take 1 tablet (40 mg  total) by mouth daily. 30 tablet 1  . halobetasol (ULTRAVATE) 0.05 % cream APPLY CREAM TOPICALLY THREE TIMES DAILY AS NEEDED FOR RASH. (Patient not taking: Reported on 12/05/2017) 150 g 3  . hydrALAZINE (APRESOLINE) 10 MG tablet Take 1 tablet (10 mg total) by mouth 3 (three) times daily. 40 tablet 0  . methimazole (TAPAZOLE) 10 MG tablet Take 1 tablet (10 mg total) by mouth daily. (Patient taking differently: Take 10 mg by mouth daily. Pt states he takes every other day) 90 tablet 1  . metoprolol succinate (TOPROL-XL) 25 MG 24 hr tablet TAKE ONE-HALF TABLET BY MOUTH ONCE DAILY 15 tablet 4  . naphazoline-pheniramine (NAPHCON-A) 0.025-0.3 % ophthalmic solution Place 1  drop into both eyes 4 (four) times daily as needed for eye irritation.    . sitaGLIPtin (JANUVIA) 100 MG tablet Take 1 tablet (100 mg total) by mouth daily. 30 tablet 11  . terbinafine (LAMISIL) 250 MG tablet Pt has not started taking yet (06/26/14)    . traMADol (ULTRAM) 50 MG tablet Take 2 tablets (100 mg total) by mouth every 6 (six) hours as needed. (Patient not taking: Reported on 12/05/2017) 20 tablet 0  . Verapamil HCl CR 300 MG CP24 Take 1 capsule (300 mg total) by mouth daily. 30 each 11  . warfarin (COUMADIN) 5 MG tablet Take as directed by Anticoagulation clinic. (Patient taking differently: Take 7.5-10 mg by mouth daily. 10 mg M, W, F, Sat, Sun, 7.5 mg Tu, Th) 150 tablet 0   No current facility-administered medications on file prior to visit.     Allergies  Allergen Reactions  . Metformin Diarrhea and Nausea Only  . Viagra [Sildenafil Citrate] Other (See Comments)    headache    Family History  Problem Relation Age of Onset  . Diabetes Mother   . Hypertension Mother   . Hyperlipidemia Mother   . Diabetes Father   . Hypertension Father   . Hyperlipidemia Father   . Heart attack Father   . Heart disease Father     BP (!) 164/110 (BP Location: Left Arm, Patient Position: Sitting, Cuff Size: Normal)   Pulse 97   Wt 285 lb 12.8 oz (129.6 kg)   SpO2 97%   BMI 39.86 kg/m    Review of Systems Denies sob.  Edema is unchanged    Objective:   Physical Exam VITAL SIGNS:  See vs page GENERAL: no distress Pulses: foot pulses are intact bilaterally.   MSK: no deformity of the feet or ankles.  CV: trace bilat edema of the legs, and bilat vv's Skin:  no ulcer on the feet or ankles.  normal color and temp on the feet and ankles.   Neuro: sensation is intact to touch on the feet and ankles.  Ext: There is bilateral onychomycosis of the toenails.      Lab Results  Component Value Date   CREATININE 1.09 12/05/2017   BUN 6 12/05/2017   NA 137 12/05/2017   K 3.5  12/05/2017   CL 103 12/05/2017   CO2 25 12/05/2017   Lab Results  Component Value Date   HGBA1C 7.0 12/10/2017      Assessment & Plan:  HTN, worse: he needs increased rx Type 2 DM: well-controlled   Patient Instructions  I have sent a prescription to your pharmacy, to add "losartan." Please continue the same medications for diabetes Please come back to have your blood pressure rechecked early next week.   Please then come back for a  follow-up appointment in 3 months.

## 2017-12-10 NOTE — Patient Instructions (Signed)
I have sent a prescription to your pharmacy, to add "losartan." Please continue the same medications for diabetes Please come back to have your blood pressure rechecked early next week.   Please then come back for a follow-up appointment in 3 months.

## 2017-12-24 ENCOUNTER — Other Ambulatory Visit: Payer: Self-pay

## 2017-12-24 ENCOUNTER — Telehealth: Payer: Self-pay

## 2017-12-24 MED ORDER — HYDRALAZINE HCL 10 MG PO TABS: 10.0000 mg | ORAL_TABLET | Freq: Three times a day (TID) | ORAL | 99 refills | Status: DC

## 2017-12-24 NOTE — Telephone Encounter (Signed)
Patient was seen a month ago at the ED and was given hydralazine and when he came in for his visit he was told to continue this but ne refill was called in and he is almost out- please order this as soon as possible

## 2017-12-24 NOTE — Telephone Encounter (Signed)
Please refill prn 

## 2017-12-24 NOTE — Telephone Encounter (Signed)
Tried to call patient & inform him that prescription was sent.

## 2017-12-25 ENCOUNTER — Telehealth: Payer: Self-pay | Admitting: Endocrinology

## 2017-12-25 ENCOUNTER — Other Ambulatory Visit: Payer: Self-pay

## 2017-12-25 MED ORDER — HYDRALAZINE HCL 10 MG PO TABS: 10.0000 mg | ORAL_TABLET | Freq: Three times a day (TID) | ORAL | 99 refills | Status: DC

## 2017-12-25 NOTE — Telephone Encounter (Signed)
Pt stated we were faxing over to the pharmacy to get it refill.   hydrALAZINE (APRESOLINE) 10 MG tablet  [facility] pharmacy has[facility] received it yet He is stating his blood pressure is high and needs it as soon as possible. He also wants to speak to someone    Please advise.

## 2017-12-25 NOTE — Telephone Encounter (Signed)
I tried to call patient but received no answer to ask which pharmacy is preferred.. I faxed the prescription to [facility].

## 2017-12-27 DIAGNOSIS — I10 Essential (primary) hypertension: Secondary | ICD-10-CM | POA: Diagnosis not present

## 2017-12-27 DIAGNOSIS — J309 Allergic rhinitis, unspecified: Secondary | ICD-10-CM | POA: Diagnosis not present

## 2017-12-30 DIAGNOSIS — Z23 Encounter for immunization: Secondary | ICD-10-CM | POA: Diagnosis not present

## 2018-01-01 ENCOUNTER — Ambulatory Visit: Payer: 59 | Admitting: Gastroenterology

## 2018-01-05 ENCOUNTER — Encounter: Payer: Self-pay | Admitting: Endocrinology

## 2018-01-14 ENCOUNTER — Ambulatory Visit (INDEPENDENT_AMBULATORY_CARE_PROVIDER_SITE_OTHER): Payer: BLUE CROSS/BLUE SHIELD | Admitting: *Deleted

## 2018-01-14 ENCOUNTER — Other Ambulatory Visit: Payer: Self-pay | Admitting: *Deleted

## 2018-01-14 DIAGNOSIS — I2782 Chronic pulmonary embolism: Secondary | ICD-10-CM | POA: Diagnosis not present

## 2018-01-14 DIAGNOSIS — I82402 Acute embolism and thrombosis of unspecified deep veins of left lower extremity: Secondary | ICD-10-CM | POA: Diagnosis not present

## 2018-01-14 DIAGNOSIS — Z7901 Long term (current) use of anticoagulants: Secondary | ICD-10-CM

## 2018-01-14 LAB — POCT INR: INR: 3.3

## 2018-01-14 MED ORDER — WARFARIN SODIUM 5 MG PO TABS: ORAL_TABLET | ORAL | 0 refills | Status: DC

## 2018-01-14 NOTE — Patient Instructions (Signed)
Description   Skip tomorrow's dose, then Continue taking 1.5 tablets everyday except 2 tablets on Mondays, Wednesdays and Fridays.  Recheck in 4 weeks.  Call with any medication changes or procedures [phone]

## 2018-01-18 ENCOUNTER — Telehealth: Payer: Self-pay | Admitting: Endocrinology

## 2018-01-18 NOTE — Telephone Encounter (Signed)
I have given paperwork to Dr. Loanne Drilling to fill out.

## 2018-01-18 NOTE — Telephone Encounter (Signed)
Patient needs a handicapped sticker/plaquard for his vehicle. He needs Dr. Loanne Drilling to fill out the paperwork. Please let patient know when paperwork is ready

## 2018-01-21 ENCOUNTER — Other Ambulatory Visit: Payer: Self-pay | Admitting: Endocrinology

## 2018-01-22 ENCOUNTER — Other Ambulatory Visit: Payer: Self-pay

## 2018-01-25 ENCOUNTER — Ambulatory Visit (INDEPENDENT_AMBULATORY_CARE_PROVIDER_SITE_OTHER): Payer: BLUE CROSS/BLUE SHIELD | Admitting: Endocrinology

## 2018-01-25 ENCOUNTER — Encounter: Payer: Self-pay | Admitting: Endocrinology

## 2018-01-25 VITALS — BP 171/99 | HR 75 | Wt 291.6 lb

## 2018-01-25 DIAGNOSIS — I272 Pulmonary hypertension, unspecified: Secondary | ICD-10-CM | POA: Diagnosis not present

## 2018-01-25 MED ORDER — ISOSORB DINITRATE-HYDRALAZINE 20-37.5 MG PO TABS: 0.5000 | ORAL_TABLET | Freq: Three times a day (TID) | ORAL | 11 refills | Status: DC

## 2018-01-25 NOTE — Progress Notes (Signed)
Subjective:    Patient ID: Spencer English, male    DOB: 1966-03-11, 52 y.o.   MRN: [id number]  HPI Pt ret for f/u of HTN.  he was seen in ER last week, and hydralazine was added.  Pt says he sometimes misses the hydralazine.  He has severe chapping of the lips, but no assoc pain.   Past Medical History:  Diagnosis Date  . ALLERGIC RHINITIS 04/12/2009  . Atrial fibrillation ([place])   . BACK PAIN, LUMBAR 01/14/2011  . DEEP VENOUS THROMBOPHLEBITIS, LEG, LEFT 05/05/2009  . DM 08/08/2008  . DYSLIPIDEMIA 04/26/2009  . ECZEMA 05/23/2010  . 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 GERD (gastroesophageal reflux disease)   . GOUT 10/24/2010  . Hyperglycemia   . HYPERTENSION 07/21/2007  . 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 Leukopenia   . Long term (current) use of anticoagulants 04/03/2011  . PULMONARY EMBOLISM 05/03/2009  . UNSPECIFIED URINARY CALCULUS 07/04/2010    Past Surgical History:  Procedure Laterality Date  . ANTERIOR CRUCIATE LIGAMENT REPAIR  2000   Right  . CYSTECTOMY     back of head  . KNEE ARTHROSCOPY     left knee  . KNEE ARTHROSCOPY  01/03/2013   Procedure: ARTHROSCOPY KNEE;  Surgeon: Yvette Rack., MD;  Location: [facility];  Service: Orthopedics;  Laterality: Left;  Lavage Synovectomy, Removal of loose body  . KNEE ARTHROSCOPY W/ ACL RECONSTRUCTION     right knee    Social History   Socioeconomic History  . Marital status: Legally Separated    Spouse name: Not on file  . Number of children: 3  . Years of education: Not on file  . Highest education level: Not on file  Social Needs  . Financial resource strain: Not on file  . Food insecurity - worry: Not on file  . Food insecurity - inability: Not on file  . Transportation needs - medical: Not on file  . Transportation needs - non-medical: Not on file  Occupational History  . Occupation: Truck Education administrator: [place]    Employer: [employer]  Tobacco Use  . Smoking status: Former Smoker    Types:  Cigars    Last attempt to quit: 01/07/2005    Years since quitting: 13.0  . Smokeless tobacco: Never Used  Substance and Sexual Activity  . Alcohol use: No  . Drug use: No  . Sexual activity: Not on file  Other Topics Concern  . Not on file  Social History Narrative  . Not on file    Current Outpatient Medications on File Prior to Visit  Medication Sig Dispense Refill  . allopurinol (ZYLOPRIM) 300 MG tablet Take 1 tablet (300 mg total) by mouth at bedtime. 90 tablet 1  . atorvastatin (LIPITOR) 20 MG tablet Take 1 tablet (20 mg total) by mouth daily. 90 tablet 0  . cyclobenzaprine (FLEXERIL) 10 MG tablet Take 1 tablet (10 mg total) by mouth 3 (three) times daily. 30 tablet 0  . fluticasone (FLONASE) 50 MCG/ACT nasal spray Place 2 sprays into both nostrils daily as needed. For allergies. 16 g 11  . furosemide (LASIX) 40 MG tablet TAKE 1 TABLET BY MOUTH ONCE DAILY 30 tablet 1  . halobetasol (ULTRAVATE) 0.05 % cream APPLY CREAM TOPICALLY THREE TIMES DAILY AS NEEDED FOR RASH. 150 g 3  . losartan (COZAAR) 100 MG tablet Take 1 tablet (100 mg total) by mouth daily. 30 tablet 11  . methimazole (TAPAZOLE) 10 MG tablet  Take 1 tablet (10 mg total) by mouth daily. (Patient taking differently: Take 10 mg by mouth daily. Pt states he takes every other day) 90 tablet 1  . metoprolol succinate (TOPROL-XL) 25 MG 24 hr tablet TAKE ONE-HALF TABLET BY MOUTH ONCE DAILY 15 tablet 4  . naphazoline-pheniramine (NAPHCON-A) 0.025-0.3 % ophthalmic solution Place 1 drop into both eyes 4 (four) times daily as needed for eye irritation.    . sitaGLIPtin (JANUVIA) 100 MG tablet Take 1 tablet (100 mg total) by mouth daily. 30 tablet 11  . terbinafine (LAMISIL) 250 MG tablet Pt has not started taking yet (06/26/14)    . traMADol (ULTRAM) 50 MG tablet Take 2 tablets (100 mg total) by mouth every 6 (six) hours as needed. 20 tablet 0  . Verapamil HCl CR 300 MG CP24 Take 1 capsule (300 mg total) by mouth daily. 30 each 11  .  warfarin (COUMADIN) 5 MG tablet Take as directed by Anticoagulation clinic. 50 tablet 0   No current facility-administered medications on file prior to visit.     Allergies  Allergen Reactions  . Metformin Diarrhea and Nausea Only  . Viagra [Sildenafil Citrate] Other (See Comments)    headache    Family History  Problem Relation Age of Onset  . Diabetes Mother   . Hypertension Mother   . Hyperlipidemia Mother   . Diabetes Father   . Hypertension Father   . Hyperlipidemia Father   . Heart attack Father   . Heart disease Father     BP (!) 171/99 (BP Location: Left Arm, Patient Position: Sitting, Cuff Size: Normal)   Pulse 75   Wt 291 lb 9.6 oz (132.3 kg)   SpO2 98%   BMI 40.67 kg/m     Review of Systems Denies sob.  Chronic right knee pain persists (he has seen ortho and had arthroscopic surgery).      Objective:   Physical Exam VITAL SIGNS:  See vs page.  GENERAL: no distress.   Right knee: nontender.  Gait: slightly favors right knee.    Lab Results  Component Value Date   CREATININE 1.09 12/05/2017   BUN 6 12/05/2017   NA 137 12/05/2017   K 3.5 12/05/2017   CL 103 12/05/2017   CO2 25 12/05/2017       Assessment & Plan:  HTN: he needs increased rx Lip chapping, new Knee pain: I declined handicapped parking form, as he does not qualify.   Patient Instructions  I have sent a prescription to your pharmacy, to change hydralazine to "bidil." You blood tests last month said you were not dehydrated last month, you should apply a moisturizer to the lips.  Please come back to have your blood pressure rechecked here early next week.   Please then come back for a follow-up appointment in 2 months.

## 2018-01-25 NOTE — Patient Instructions (Addendum)
I have sent a prescription to your pharmacy, to change hydralazine to "bidil." You blood tests last month said you were not dehydrated last month, you should apply a moisturizer to the lips.  Please come back to have your blood pressure rechecked here early next week.   Please then come back for a follow-up appointment in 2 months.

## 2018-02-03 ENCOUNTER — Encounter: Payer: Self-pay | Admitting: Physician Assistant

## 2018-02-09 NOTE — Progress Notes (Signed)
Cardiology Office Note Date:  02/10/2018  Patient ID:  Spencer English, DOB 07-30-1966, MRN [id number] PCP:  Renato Shin, MD  Electrophysiologist: Dr. Rayann Heman   Chief Complaint:  over due visit  History of Present Illness: Spencer English is a 52 y.o. male with history of PAFib/flutter in setting of hypothyroidism, felt to be resolved with treatment of his thyroid at his last visit with Dr. Rayann Heman in June 2015, HTN, HLD, hx of DVT/PE in 2010, DM, OSA w/ CPAP  He comes in today being seen for Dr. Rayann Heman, last seen by him Jun 2015, at that time with resolution of his AF symptoms with treatment of his thyroid, and c/o ED thought secondary to the BB, this was started to be down-titrated and to f/u   I saw him for EP Jun 2017, a year later, he was feeling well, but mentioned in the last couple months that he had been getting an aching type chest discomfort, mostly at night somewhat random, non-radiating, lasting about a minute with no associated symptoms.  Thought he may have had it a couple times during the day, but more noted evenings, in bed.  No SOB, no symptoms of PND or orthopnea.  He denied any palpitations, no dizziness, near syncope or syncope.  He denied any bleeding or signs of bleeding.  He inquired about holding his coumadin for a tattoo and is recommended not to pursue it.  His BP was quite elevated at that visit, the patient reported very unusually so and had reported significant deitary sodium load that morning.recently.  He was counseled, planned for a stress test and an echo to f/u on his MR.  His echo noted LVEF 55-60%, trivial MR, no WMA, stress test was negative.  He had a ER visit Dec 2018 2/2 headache and HTN and was rx hydralazine to his regime, saw his PMD in f/u and hydralazine was changed to BiDil, noting he had been missing doses of his hydralazine.  He comes today feeling well.  Initially his BP taken at 160/108, a recheck by myself is 156/92, and 160/94.  He has not taken his  mid-day me yet (it is in the truck).  He is feeling well, no CP, palpitations or SOB, no dizziness, near syncope or syncope.  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  He of late has been drinking a fair amount of beer, 3-4 some nights, usually less, 1-2, and perhaps 6-12 in a weekend.  He has end of the day LE swelling, none when he wakes.  He denies any bleeding or signs of bleeding, sees coumadin clinic after our visit  Past Medical History:  Diagnosis Date  . ALLERGIC RHINITIS 04/12/2009  . Atrial fibrillation ([place])   . BACK PAIN, LUMBAR 01/14/2011  . DEEP VENOUS THROMBOPHLEBITIS, LEG, LEFT 05/05/2009  . DM 08/08/2008  . DYSLIPIDEMIA 04/26/2009  . ECZEMA 05/23/2010  . 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 GERD (gastroesophageal reflux disease)   . GOUT 10/24/2010  . Hyperglycemia   . HYPERTENSION 07/21/2007  . 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 Leukopenia   . Long term (current) use of anticoagulants 04/03/2011  . PULMONARY EMBOLISM 05/03/2009  . UNSPECIFIED URINARY CALCULUS 07/04/2010    Past Surgical History:  Procedure Laterality Date  . ANTERIOR CRUCIATE LIGAMENT REPAIR  2000   Right  . CYSTECTOMY     back of head  . KNEE ARTHROSCOPY     left knee  . KNEE ARTHROSCOPY  01/03/2013  Procedure: ARTHROSCOPY KNEE;  Surgeon: Yvette Rack., MD;  Location: [facility];  Service: Orthopedics;  Laterality: Left;  Lavage Synovectomy, Removal of loose body  . KNEE ARTHROSCOPY W/ ACL RECONSTRUCTION     right knee    Current Outpatient Medications  Medication Sig Dispense Refill  . allopurinol (ZYLOPRIM) 300 MG tablet Take 1 tablet (300 mg total) by mouth at bedtime. 90 tablet 1  . atorvastatin (LIPITOR) 20 MG tablet Take 1 tablet (20 mg total) by mouth daily. 90 tablet 0  . fluticasone (FLONASE) 50 MCG/ACT nasal spray Place 2 sprays into both nostrils daily as needed. For allergies. 16 g 11  . furosemide (LASIX) 40 MG tablet TAKE 1 TABLET BY MOUTH ONCE DAILY 30  tablet 1  . halobetasol (ULTRAVATE) 0.05 % cream APPLY CREAM TOPICALLY THREE TIMES DAILY AS NEEDED FOR RASH. 150 g 3  . isosorbide-hydrALAZINE (BIDIL) 20-37.5 MG tablet Take 0.5 tablets by mouth 3 (three) times daily. 30 tablet 11  . losartan (COZAAR) 100 MG tablet Take 1 tablet (100 mg total) by mouth daily. 30 tablet 11  . methimazole (TAPAZOLE) 10 MG tablet Take 1 tablet (10 mg total) by mouth daily. (Patient taking differently: Take 10 mg by mouth daily. Pt states he takes every other day) 90 tablet 1  . metoprolol succinate (TOPROL-XL) 25 MG 24 hr tablet TAKE ONE-HALF TABLET BY MOUTH ONCE DAILY 15 tablet 4  . sitaGLIPtin (JANUVIA) 100 MG tablet Take 1 tablet (100 mg total) by mouth daily. 30 tablet 11  . terbinafine (LAMISIL) 250 MG tablet Pt has not started taking yet (06/26/14)    . Verapamil HCl CR 300 MG CP24 Take 1 capsule (300 mg total) by mouth daily. 30 each 11  . warfarin (COUMADIN) 5 MG tablet Take as directed by Anticoagulation clinic. 50 tablet 0   No current facility-administered medications for this visit.     Allergies:   Metformin and Viagra [sildenafil citrate]   Social History:  The patient  reports that he quit smoking about 13 years ago. His smoking use included cigars. he has never used smokeless tobacco. He reports that he does not drink alcohol or use drugs.   Family History:  The patient's family history includes Diabetes in his father and mother; Heart attack in his father; Heart disease in his father; Hyperlipidemia in his father and mother; Hypertension in his father and mother.  ROS:  Please see the history of present illness.   All other systems are reviewed and otherwise negative.   PHYSICAL EXAM:  VS:  BP (!) 160/108   Pulse 94   Ht 6' (1.829 m)   Wt 290 lb (131.5 kg)   BMI 39.33 kg/m  BMI: Body mass index is 39.33 kg/m. Well nourished, well developed, obese, in no acute distress  HEENT: normocephalic, atraumatic  Neck: no JVD, carotid bruits or  masses Cardiac:  RRR; no significant murmurs, no rubs, or gallops Lungs:  CTA b/l, no wheezing, rhonchi or rales  Abd: soft, nontender, obese MS: no deformity or atrophy Ext: trace edema b/l Skin: warm and dry, no rash Neuro:  No gross deficits appreciated Psych: euthymic mood, full affect   EKG:  Not done today  07/08/16: TTE Study Conclusions - Left ventricle: The cavity size was normal. Systolic function was   normal. The estimated ejection fraction was in the range of 55%   to 60%. Wall motion was normal; there were no regional wall   motion abnormalities. There was an  increased relative   contribution of atrial contraction to ventricular filling.   Doppler parameters are consistent with abnormal left ventricular   relaxation (grade 1 diastolic dysfunction). - Aortic valve: Trileaflet; normal thickness, mildly calcified   leaflets. - Aorta: Aortic root dimension: 42 mm (ED). - Aortic root: The aortic root was mildly dilated. - Mitral valve: There was trivial regurgitation. - Atrial septum: A patent foramen ovale cannot be excluded. - Tricuspid valve: There was mild regurgitation. - Recommendations: Agitated saline contrast study to rule out PFO. Recommendations:  Agitated saline contrast study to rule out PFO.  07/09/16: stress myoview  Nuclear stress EF: 51%.  There was no ST segment deviation noted during stress.  The study is normal.  This is a low risk study.  The left ventricular ejection fraction is mildly decreased (45-54%). Normal exercise nuclear stress test with no evidence of prior infarct or ischemia.  LVEF calculated at 51% but visually appears better.   01/24/13: Echocardiogram Study Conclusions - Left ventricle: The cavity size was moderately dilated. Wall thickness was normal. Systolic function was normal. The estimated ejection fraction was in the range of 50% to 55%. - Mitral valve: Moderate regurgitation. - Left atrium: The atrium was  moderately dilated. - Right ventricle: The cavity size was moderately dilated. Systolic function was mildly reduced. - Right atrium: The atrium was moderately dilated. - Tricuspid valve: Moderate regurgitation. - Pulmonary arteries: Systolic pressure was mildly to moderately increased. PA peak pressure: 64mm Hg (S).   Recent Labs: 10/07/2017: TSH 1.37 12/05/2017: ALT 19; BUN 6; Creatinine, Ser 1.09; Hemoglobin 14.7; Platelets 185; Potassium 3.5; Sodium 137  03/09/2017: Cholesterol 166; HDL 42.00; LDL Cholesterol 111; Total CHOL/HDL Ratio 4; Triglycerides 62.0; VLDL 12.4   CrCl cannot be calculated (Patient's most recent lab result is older than the maximum 21 days allowed.).   Wt Readings from Last 3 Encounters:  02/10/18 290 lb (131.5 kg)  01/25/18 291 lb 9.6 oz (132.3 kg)  12/10/17 285 lb 12.8 oz (129.6 kg)     Other studies reviewed: Additional studies/records reviewed today include: summarized above  ASSESSMENT AND PLAN:  1. PAFib, in the setting of hyperthyroism     No palpitations     CHA2DS2Vasc is at least 2, on warfarin, monitored and managed with coumadinclinic     No reports of bleeding or signs of bleeding     No symptoms of AF, last EKG in dec was SR  2. 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 OSA/CPAP     Dr. Elsworth Soho, pulmonary     Reports compliance with his CPAP and closely monitored by DOT  4. HTN     His initial BP again was high, a recheck was improved but still high.       He reports 140's/70s at home when on track with  His medicines  Discussed at length importance of BP control.   He hasn't taken his mid-day BiDil and will miss this occasionally Discussed minimizing sodium, starting exercise and weight loss efforts as well, he admits to a sweet tooth, and the beer  He sees his PMD for  BP check  in 2-3 weeks  4. HLD     He reports labs are monitored and managed with his PMD  5. chest discomfort  Not an ongoing complaint, neg stress in 2017     6. 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  The patient did not have any concerns regarding medicines.  Haywood Lasso, PA-C 02/10/2018 3:17 PM     [facility] [address] [place] [address] [phone] (office)  [phone] (fax)

## 2018-02-10 ENCOUNTER — Ambulatory Visit: Payer: 59 | Admitting: Endocrinology

## 2018-02-10 ENCOUNTER — Ambulatory Visit (INDEPENDENT_AMBULATORY_CARE_PROVIDER_SITE_OTHER): Payer: BLUE CROSS/BLUE SHIELD | Admitting: *Deleted

## 2018-02-10 ENCOUNTER — Ambulatory Visit (INDEPENDENT_AMBULATORY_CARE_PROVIDER_SITE_OTHER): Payer: BLUE CROSS/BLUE SHIELD | Admitting: Physician Assistant

## 2018-02-10 VITALS — BP 160/108 | HR 94 | Ht 72.0 in | Wt 290.0 lb

## 2018-02-10 DIAGNOSIS — I2782 Chronic pulmonary embolism: Secondary | ICD-10-CM

## 2018-02-10 DIAGNOSIS — I480 Paroxysmal atrial fibrillation: Secondary | ICD-10-CM | POA: Diagnosis not present

## 2018-02-10 DIAGNOSIS — I82402 Acute embolism and thrombosis of unspecified deep veins of left lower extremity: Secondary | ICD-10-CM | POA: Diagnosis not present

## 2018-02-10 DIAGNOSIS — I10 Essential (primary) hypertension: Secondary | ICD-10-CM

## 2018-02-10 DIAGNOSIS — Z7901 Long term (current) use of anticoagulants: Secondary | ICD-10-CM

## 2018-02-10 DIAGNOSIS — E7849 Other hyperlipidemia: Secondary | ICD-10-CM | POA: Diagnosis not present

## 2018-02-10 LAB — POCT INR: INR: 1.8

## 2018-02-10 MED ORDER — WARFARIN SODIUM 5 MG PO TABS: ORAL_TABLET | ORAL | 0 refills | Status: DC

## 2018-02-10 NOTE — Patient Instructions (Signed)
Description   Today Feb 13th take another 1/2 tablet of coumadin as he has already taken 2 tablets today then continue taking 1.5 tablets everyday except 2 tablets on Mondays, Wednesdays and Fridays.  Recheck in 2 weeks.  Call with any medication changes or procedures [phone]

## 2018-02-10 NOTE — Patient Instructions (Signed)
Medication Instructions:   Your physician recommends that you continue on your current medications as directed. Please refer to the Current Medication list given to you today.   If you need a refill on your cardiac medications before your next appointment, please call your pharmacy.  Labwork: NONE ORDERED  TODAY    Testing/Procedures: NONE ORDERED  TODAY    Follow-Up: Your physician wants you to follow-up in:  IN  6  MONTHS WITH URSUY You will receive a reminder letter in the mail two months in advance. If you don't receive a letter, please call our office to schedule the follow-up appointment.      Any Other Special Instructions Will Be Listed Below (If Applicable).                                                                                                                                                   

## 2018-03-08 ENCOUNTER — Emergency Department (HOSPITAL_COMMUNITY)
Admission: EM | Admit: 2018-03-08 | Discharge: 2018-03-09 | Disposition: A | Discharge status: Home / Self Care | Payer: BLUE CROSS/BLUE SHIELD | Attending: Emergency Medicine | Admitting: Emergency Medicine

## 2018-03-08 ENCOUNTER — Encounter (HOSPITAL_COMMUNITY): Payer: Self-pay | Admitting: Family Medicine

## 2018-03-08 ENCOUNTER — Emergency Department (HOSPITAL_COMMUNITY): Payer: BLUE CROSS/BLUE SHIELD

## 2018-03-08 DIAGNOSIS — R079 Chest pain, unspecified: Secondary | ICD-10-CM | POA: Diagnosis not present

## 2018-03-08 DIAGNOSIS — R0602 Shortness of breath: Secondary | ICD-10-CM | POA: Insufficient documentation

## 2018-03-08 DIAGNOSIS — E119 Type 2 diabetes mellitus without complications: Secondary | ICD-10-CM | POA: Diagnosis not present

## 2018-03-08 DIAGNOSIS — R51 Headache: Secondary | ICD-10-CM | POA: Diagnosis not present

## 2018-03-08 DIAGNOSIS — Z79899 Other long term (current) drug therapy: Secondary | ICD-10-CM | POA: Diagnosis not present

## 2018-03-08 DIAGNOSIS — R519 Headache, unspecified: Secondary | ICD-10-CM

## 2018-03-08 DIAGNOSIS — F17200 Nicotine dependence, unspecified, uncomplicated: Secondary | ICD-10-CM | POA: Diagnosis not present

## 2018-03-08 DIAGNOSIS — Z7901 Long term (current) use of anticoagulants: Secondary | ICD-10-CM | POA: Insufficient documentation

## 2018-03-08 DIAGNOSIS — Z7984 Long term (current) use of oral hypoglycemic drugs: Secondary | ICD-10-CM | POA: Insufficient documentation

## 2018-03-08 DIAGNOSIS — I10 Essential (primary) hypertension: Secondary | ICD-10-CM | POA: Diagnosis not present

## 2018-03-08 DIAGNOSIS — R072 Precordial pain: Secondary | ICD-10-CM | POA: Diagnosis not present

## 2018-03-08 NOTE — ED Triage Notes (Signed)
Patient reports he is experiencing a headache for the last 8 days and shortness of breath intermittently for the last week. Patient reports he missed 2 doses of Coumadin 2 weeks ago, prior to his symptoms. Patient reports he takes the Coumadin for DVT's and PE's.

## 2018-03-09 ENCOUNTER — Encounter (HOSPITAL_COMMUNITY): Payer: Self-pay

## 2018-03-09 ENCOUNTER — Emergency Department (HOSPITAL_COMMUNITY): Payer: BLUE CROSS/BLUE SHIELD

## 2018-03-09 DIAGNOSIS — R51 Headache: Secondary | ICD-10-CM | POA: Diagnosis not present

## 2018-03-09 DIAGNOSIS — R0602 Shortness of breath: Secondary | ICD-10-CM | POA: Diagnosis not present

## 2018-03-09 LAB — CBC WITH DIFFERENTIAL/PLATELET
BASOS ABS: 0 10*3/uL (ref 0.0–0.1)
Basophils Relative: 1 %
EOS PCT: 4 %
Eosinophils Absolute: 0.2 10*3/uL (ref 0.0–0.7)
HCT: 45.6 % (ref 39.0–52.0)
Hemoglobin: 14.9 g/dL (ref 13.0–17.0)
LYMPHS ABS: 1.8 10*3/uL (ref 0.7–4.0)
Lymphocytes Relative: 36 %
MCH: 28.1 pg (ref 26.0–34.0)
MCHC: 32.7 g/dL (ref 30.0–36.0)
MCV: 86 fL (ref 78.0–100.0)
MONO ABS: 0.6 10*3/uL (ref 0.1–1.0)
Monocytes Relative: 12 %
NEUTROS ABS: 2.4 10*3/uL (ref 1.7–7.7)
Neutrophils Relative %: 47 %
PLATELETS: 230 10*3/uL (ref 150–400)
RBC: 5.3 MIL/uL (ref 4.22–5.81)
RDW: 17.7 % — ABNORMAL HIGH (ref 11.5–15.5)
WBC: 5 10*3/uL (ref 4.0–10.5)

## 2018-03-09 LAB — COMPREHENSIVE METABOLIC PANEL
ALBUMIN: 4.2 g/dL (ref 3.5–5.0)
ALT: 24 U/L (ref 17–63)
ANION GAP: 9 (ref 5–15)
AST: 22 U/L (ref 15–41)
Alkaline Phosphatase: 80 U/L (ref 38–126)
BUN: 10 mg/dL (ref 6–20)
CHLORIDE: 106 mmol/L (ref 101–111)
CO2: 27 mmol/L (ref 22–32)
Calcium: 8.9 mg/dL (ref 8.9–10.3)
Creatinine, Ser: 1.07 mg/dL (ref 0.61–1.24)
GFR calc Af Amer: >60 mL/min (ref >60)
GFR calc non Af Amer: >60 mL/min (ref >60)
GLUCOSE: 111 mg/dL — AB (ref 65–99)
POTASSIUM: 3.7 mmol/L (ref 3.5–5.1)
Sodium: 142 mmol/L (ref 135–145)
TOTAL PROTEIN: 8.1 g/dL (ref 6.5–8.1)
Total Bilirubin: 0.8 mg/dL (ref 0.3–1.2)

## 2018-03-09 LAB — I-STAT TROPONIN, ED
TROPONIN I, POC: 0 ng/mL (ref 0.00–0.08)
Troponin i, poc: 0 ng/mL (ref 0.00–0.08)

## 2018-03-09 LAB — PROTIME-INR
INR: 2.44
Prothrombin Time: 26.3 seconds — ABNORMAL HIGH (ref 11.4–15.2)

## 2018-03-09 LAB — BRAIN NATRIURETIC PEPTIDE: B Natriuretic Peptide: 36.8 pg/mL (ref 0.0–100.0)

## 2018-03-09 MED ORDER — CYCLOBENZAPRINE HCL 10 MG PO TABS: 10.0000 mg | ORAL_TABLET | Freq: Two times a day (BID) | ORAL | 0 refills | Status: DC | PRN

## 2018-03-09 MED ORDER — SODIUM CHLORIDE 0.9 % IJ SOLN
INTRAMUSCULAR | Status: AC
  Filled 2018-03-09: qty 50

## 2018-03-09 MED ORDER — METHYLPREDNISOLONE SODIUM SUCC 125 MG IJ SOLR
80.0000 mg | Freq: Once | INTRAMUSCULAR | Status: AC
  Administered 2018-03-09: 80 mg via INTRAVENOUS
  Filled 2018-03-09: qty 2

## 2018-03-09 MED ORDER — SODIUM CHLORIDE 0.9 % IV BOLUS (SEPSIS)
500.0000 mL | Freq: Once | INTRAVENOUS | Status: AC
  Administered 2018-03-09: 500 mL via INTRAVENOUS

## 2018-03-09 MED ORDER — PROCHLORPERAZINE EDISYLATE 5 MG/ML IJ SOLN
10.0000 mg | Freq: Once | INTRAMUSCULAR | Status: AC
  Administered 2018-03-09: 10 mg via INTRAVENOUS
  Filled 2018-03-09: qty 2

## 2018-03-09 MED ORDER — DIPHENHYDRAMINE HCL 50 MG/ML IJ SOLN
25.0000 mg | Freq: Once | INTRAMUSCULAR | Status: AC
  Administered 2018-03-09: 25 mg via INTRAVENOUS
  Filled 2018-03-09: qty 1

## 2018-03-09 MED ORDER — IOPAMIDOL (ISOVUE-370) INJECTION 76%
100.0000 mL | Freq: Once | INTRAVENOUS | Status: AC | PRN
  Administered 2018-03-09: 100 mL via INTRAVENOUS

## 2018-03-09 MED ORDER — IOPAMIDOL (ISOVUE-370) INJECTION 76%
INTRAVENOUS | Status: AC
  Filled 2018-03-09: qty 100

## 2018-03-09 NOTE — Discharge Instructions (Signed)
Your workup has been reassuring.  There is some mild congestion of your heart and your lungs.  May want to increase her Lasix.  May want to take 60 mg a day however will discuss with your primary care doctor before starting this.  It is very important that you follow-up with your primary care doctor to have further workup including possible echocardiogram of your heart.  Return to the ED with any worsening symptoms.  We will continue taken Tylenol for your headache.  We will add on muscle relaxers.  Keep heat on the affected area.  Follow-up with primary care doctor for further workup if headaches do not improve and return to the ED with any worsening symptoms.

## 2018-03-09 NOTE — ED Provider Notes (Signed)
[facility] DEPT Provider Note   CSN: [id number] Arrival date & time: 03/08/18  1826     History   Chief Complaint Chief Complaint  Patient presents with  . Headache  . Shortness of Breath    HPI Spencer English is a 52 y.o. male.  HPI 52 year old African American male past medical history significant for atrial fibrillation, pulmonary embolism currently anticoagulated with warfarin, GERD, hypertension that presents to the emergency department today for audible complaints.  First patient complains of exertional dyspnea over the past 3-4 weeks.  Patient states that he did miss 2 doses of his Coumadin 2 weeks ago.  Denies any recent missed doses.  Patient states that the dyspnea on exertion is new for him.  He denies any orthopnea or nocturnal dyspnea.  She does report some substernal chest pain that radiates to the left arm.  This is intermittent.  Last episode was last night.  States that last for several seconds and self resolved.  Not associated exertion.  Denies any pleuritic chest pain.  Patient denies any shortness of breath at rest.  Patient also complains of a posterior headache.  This has been ongoing for the past 8 days.  States that the headache will come and go.  States it starts the back of his neck and radiates up to parietal region.  States that wearing hats makes the pain worse.  States that he can touch his scalp and is very tender to palpation.  Patient also reports some neck spasms bilaterally.  Patient describes the pain as a burning sensation.  Denies any red flag symptoms concerning for maximal or sudden in onset.  Patient denies any fevers, night sweats, weight loss.  Patient has been taking Tylenol for his headaches with only small amount of relief.  Vision changes, lightheadedness or dizziness.  Pt denies any fever, chill, vision changes, lightheadedness, dizziness, congestion, neck pain, cough, abd pain, n/v/d, urinary symptoms, change in  bowel habits, melena, hematochezia, lower extremity paresthesias.       Past Medical History:  Diagnosis Date  . ALLERGIC RHINITIS 04/12/2009  . Atrial fibrillation ([place])   . BACK PAIN, LUMBAR 01/14/2011  . DEEP VENOUS THROMBOPHLEBITIS, LEG, LEFT 05/05/2009  . DM 08/08/2008  . DYSLIPIDEMIA 04/26/2009  . ECZEMA 05/23/2010  . 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 GERD (gastroesophageal reflux disease)   . GOUT 10/24/2010  . Hyperglycemia   . HYPERTENSION 07/21/2007  . 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 Leukopenia   . Long term (current) use of anticoagulants 04/03/2011  . PULMONARY EMBOLISM 05/03/2009  . UNSPECIFIED URINARY CALCULUS 07/04/2010    Patient Active Problem List   Diagnosis Date Noted  . OSA on CPAP 06/04/2017  . Obese 06/04/2017  . Wellness examination 09/05/2015  . Screening for cancer 05/15/2015  . Sleep disorder 06/27/2014  . Moderate mitral regurgitation 06/26/2014  . Screening for prostate cancer 10/10/2013  . Pulmonary edema 02/01/2013  . Hyperthyroidism 01/27/2013  . Moderate to severe pulmonary hypertension ([place]) 01/26/2013  . Syncope 01/25/2013  . Microcytic anemia 01/25/2013  . Thrombocytopenia ([place]) 01/25/2013  . Hypotension due to drugs 01/23/2013  . History of septic arthritis 01/23/2013  . Atrial fibrillation ([place])   . Palpitations 11/03/2012  . Encounter for long-term (current) use of other medications 09/06/2012  . Pre-employment examination 10/11/2011  . Screening examination for infectious disease 06/16/2011  . Encounter for long-term (current) use of anticoagulants 04/03/2011  . WHEEZING 01/31/2011  . BACK PAIN, LUMBAR 01/14/2011  . Gout 10/24/2010  .  UNSPECIFIED URINARY CALCULUS 07/04/2010  . ECZEMA 05/23/2010  . EDEMA 10/25/2009  . 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 DVT (deep venous thrombosis) ([place]) 05/05/2009  . FOOT PAIN, LEFT 05/05/2009  . Chronic pulmonary embolism ([place]) 05/03/2009  . DYSLIPIDEMIA 04/26/2009  . ALLERGIC RHINITIS 04/12/2009    . Pain in joint, ankle and foot 04/12/2009  . Diabetes ([place]) 08/08/2008  . 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 SEBACEOUS CYST 01/31/2008  . Essential hypertension 07/21/2007    Past Surgical History:  Procedure Laterality Date  . ANTERIOR CRUCIATE LIGAMENT REPAIR  2000   Right  . CYSTECTOMY     back of head  . KNEE ARTHROSCOPY     left knee  . KNEE ARTHROSCOPY  01/03/2013   Procedure: ARTHROSCOPY KNEE;  Surgeon: Yvette Rack., MD;  Location: [facility];  Service: Orthopedics;  Laterality: Left;  Lavage Synovectomy, Removal of loose body  . KNEE ARTHROSCOPY W/ ACL RECONSTRUCTION     right knee       Home Medications    Prior to Admission medications   Medication Sig Start Date End Date Taking? Authorizing Provider  allopurinol (ZYLOPRIM) 300 MG tablet Take 1 tablet (300 mg total) by mouth at bedtime. 05/26/17  Yes Renato Shin, MD  atorvastatin (LIPITOR) 20 MG tablet Take 1 tablet (20 mg total) by mouth daily. 10/07/17  Yes Renato Shin, MD  fluticasone [facility]) 50 MCG/ACT nasal spray Place 2 sprays into both nostrils daily as needed. For allergies. 05/20/17  Yes Renato Shin, MD  furosemide (LASIX) 40 MG tablet TAKE 1 TABLET BY MOUTH ONCE DAILY 01/22/18  Yes Renato Shin, MD  halobetasol (ULTRAVATE) 0.05 % cream APPLY CREAM TOPICALLY THREE TIMES DAILY AS NEEDED FOR RASH. 10/07/17  Yes Renato Shin, MD  isosorbide-hydrALAZINE (BIDIL) 20-37.5 MG tablet Take 0.5 tablets by mouth 3 (three) times daily. Patient taking differently: Take 0.5 tablets by mouth 2 (two) times daily.  01/25/18  Yes Renato Shin, MD  losartan (COZAAR) 100 MG tablet Take 1 tablet (100 mg total) by mouth daily. 12/10/17  Yes Renato Shin, MD  methimazole (TAPAZOLE) 10 MG tablet Take 1 tablet (10 mg total) by mouth daily. Patient taking differently: Take 10 mg by mouth every other day.  05/26/17  Yes Renato Shin, MD  metoprolol succinate (TOPROL-XL) 25 MG 24 hr tablet TAKE ONE-HALF TABLET BY MOUTH  ONCE DAILY 04/10/17  Yes Renato Shin, MD  sitaGLIPtin (JANUVIA) 100 MG tablet Take 1 tablet (100 mg total) by mouth daily. 10/07/17  Yes Renato Shin, MD  Verapamil HCl CR 300 MG CP24 Take 1 capsule (300 mg total) by mouth daily. 10/07/17  Yes Renato Shin, MD  warfarin (COUMADIN) 5 MG tablet Take as directed by Anticoagulation clinic. Patient taking differently: Take 7.5 mg by mouth daily. Take as directed by Anticoagulation clinic. 02/10/18  Yes Allred, Jeneen Rinks, MD  cyclobenzaprine (FLEXERIL) 10 MG tablet Take 1 tablet (10 mg total) by mouth 2 (two) times daily as needed. 03/09/18   Doristine Devoid, PA-C    Family History Family History  Problem Relation Age of Onset  . Diabetes Mother   . Hypertension Mother   . Hyperlipidemia Mother   . Diabetes Father   . Hypertension Father   . Hyperlipidemia Father   . Heart attack Father   . Heart disease Father     Social History Social History   Tobacco Use  . Smoking status: Current Some Day Smoker    Types: Cigars    Last attempt to  quit: 01/07/2005    Years since quitting: 13.1  . Smokeless tobacco: Never Used  Substance Use Topics  . Alcohol use: Yes    Comment: Beer everyother weekend   . Drug use: No     Allergies   Metformin and Viagra [sildenafil citrate]   Review of Systems Review of Systems  All other systems reviewed and are negative.    Physical Exam Updated Vital Signs BP (!) 126/96   Pulse 76   Temp 98.5 F (36.9 C) (Oral)   Resp 18   Ht 6' (1.829 m)   Wt 127 kg (280 lb)   SpO2 93%   BMI 37.97 kg/m   Physical Exam  Constitutional: He is oriented to person, place, and time. He appears well-developed and well-nourished.  Non-toxic appearance. No distress.  HENT:  Head: Normocephalic and atraumatic.  Nose: Nose normal.  Mouth/Throat: Oropharynx is clear and moist.  Eyes: Conjunctivae and EOM are normal. Pupils are equal, round, and reactive to light. Right eye exhibits no discharge. Left eye  exhibits no discharge.  Neck: Normal range of motion. Neck supple. No JVD present. No tracheal deviation present.  Tense musculature noted at the bilateral paraspinal musculature of the cervical spine.  No c spine midline tenderness. No paraspinal tenderness. No deformities or step offs noted. Full ROM. Supple. No nuchal rigidity.    Cardiovascular: Normal rate, regular rhythm, normal heart sounds and intact distal pulses. Exam reveals no gallop and no friction rub.  No murmur heard. Pulmonary/Chest: Effort normal and breath sounds normal. No stridor. No respiratory distress. He has no wheezes. He has no rales. He exhibits no tenderness.  No hypoxia or tachypnea.  Abdominal: Soft. Bowel sounds are normal. He exhibits no distension. There is no tenderness. There is no rebound and no guarding.  Musculoskeletal: Normal range of motion. He exhibits no edema or tenderness.  No calf tenderness.  Trace pitting edema bilaterally.  Lymphadenopathy:    He has no cervical adenopathy.  Neurological: He is alert and oriented to person, place, and time.  The patient is alert, attentive, and oriented x 3. Speech is clear. Cranial nerve II-VII grossly intact. Negative pronator drift. Sensation intact. Strength 5/5 in all extremities. Reflexes 2+ and symmetric at biceps, triceps, knees, and ankles. Rapid alternating movement and fine finger movements intact. Romberg is absent. Posture and gait normal.   Skin: Skin is warm and dry. Capillary refill takes less than 2 seconds. No rash noted. He is not diaphoretic.  Psychiatric: His behavior is normal. Judgment and thought content normal.  Nursing note and vitals reviewed.    ED Treatments / Results  Labs (all labs ordered are listed, but only abnormal results are displayed) Labs Reviewed  COMPREHENSIVE METABOLIC PANEL - Abnormal; Notable for the following components:      Result Value   Glucose, Bld 111 (*)    All other components within normal limits   CBC WITH DIFFERENTIAL/PLATELET - Abnormal; Notable for the following components:   RDW 17.7 (*)    All other components within normal limits  PROTIME-INR - Abnormal; Notable for the following components:   Prothrombin Time 26.3 (*)    All other components within normal limits  BRAIN NATRIURETIC PEPTIDE  I-STAT TROPONIN, ED  I-STAT TROPONIN, ED    EKG  EKG Interpretation  Date/Time:  Monday March 08 2018 18:43:02 EDT Ventricular Rate:  90 PR Interval:    QRS Duration: 86 QT Interval:  371 QTC Calculation: 454 R Axis:   -  20 Text Interpretation:  Sinus rhythm Probable left atrial enlargement Borderline left axis deviation Abnormal R-wave progression, early transition Nonspecific T abnormalities, lateral leads No significant change since last tracing Confirmed by Dorie Rank [phone]) on 03/08/2018 6:46:24 PM       Radiology Dg Chest 2 View  Result Date: 03/08/2018 CLINICAL DATA:  LEFT chest pain and shortness of breath for few weeks. Headache. History of DVT and pulmonary embolism. EXAM: CHEST - 2 VIEW COMPARISON:  Chest radiograph January 31, 2013 FINDINGS: Cardiomediastinal silhouette is normal. Mild pulmonary vascular congestion. No pleural effusions or focal consolidations. Trachea projects midline and there is no pneumothorax. Soft tissue planes and included osseous structures are non-suspicious. Moderate degenerative change of the thoracic spine. IMPRESSION: Mild pulmonary vascular congestion. Electronically Signed   By: Elon Alas M.D.   On: 03/08/2018 19:12   Ct Head Wo Contrast  Result Date: 03/09/2018 CLINICAL DATA:  52 year old male with headache.  No injury. EXAM: CT HEAD WITHOUT CONTRAST TECHNIQUE: Contiguous axial images were obtained from the base of the skull through the vertex without intravenous contrast. COMPARISON:  Head CT dated 01/23/2013 FINDINGS: Brain: No evidence of acute infarction, hemorrhage, hydrocephalus, extra-axial collection or mass lesion/mass  effect. Vascular: No hyperdense vessel or unexpected calcification. Skull: Normal. Negative for fracture or focal lesion. Sinuses/Orbits: No acute finding. Other: Bilateral exophthalmos. IMPRESSION: 1. Unremarkable noncontrast CT of the brain. 2. Exophthalmos.  Clinical correlation is recommended. Electronically Signed   By: Anner Crete M.D.   On: 03/09/2018 01:57   Ct Angio Chest Pe W/cm &/or Wo Cm  Result Date: 03/09/2018 CLINICAL DATA:  52 year old male with shortness of breath. Concern for pulmonary embolism. EXAM: CT ANGIOGRAPHY CHEST WITH CONTRAST TECHNIQUE: Multidetector CT imaging of the chest was performed using the standard protocol during bolus administration of intravenous contrast. Multiplanar CT image reconstructions and MIPs were obtained to evaluate the vascular anatomy. CONTRAST:  169mL ISOVUE-370 IOPAMIDOL (ISOVUE-370) INJECTION 76% COMPARISON:  Chest radiograph dated 03/08/2018 and CT dated 09/19/2012 FINDINGS: Cardiovascular: Mild cardiomegaly. No pericardial effusion. Coronary vascular calcification primarily involving the LAD. The thoracic aorta is unremarkable. There is mild dilatation of the main pulmonary trunk suggestive of pulmonary hypertension. Clinical correlation is recommended. No CT evidence of pulmonary embolism. Mild cephalization of the pulmonary vasculature suggestive of vascular congestion. Mediastinum/Nodes: No hilar or mediastinal adenopathy. The esophagus and the thyroid gland are grossly unremarkable. No mediastinal fluid collection. Lungs/Pleura: There is no focal consolidation, pleural effusion, or pneumothorax. The central airways are patent. Upper Abdomen: No acute abnormality. Musculoskeletal: Degenerative changes of the spine. No acute osseous pathology. Review of the MIP images confirms the above findings. IMPRESSION: 1. No CT evidence of pulmonary embolism. 2. Cardiomegaly with mild vascular congestion and findings suggestive of a degree of pulmonary  hypertension. Clinical correlation is recommended. Electronically Signed   By: Anner Crete M.D.   On: 03/09/2018 02:08    Procedures Procedures (including critical care time)  Medications Ordered in ED Medications  sodium chloride 0.9 % injection (not administered)  iopamidol (ISOVUE-370) 76 % injection (not administered)  sodium chloride 0.9 % bolus 500 mL (0 mLs Intravenous Stopped 03/09/18 0130)  prochlorperazine (COMPAZINE) injection 10 mg (10 mg Intravenous Given 03/09/18 0045)  diphenhydrAMINE (BENADRYL) injection 25 mg (25 mg Intravenous Given 03/09/18 0045)  methylPREDNISolone sodium succinate (SOLU-MEDROL) 125 mg/2 mL injection 80 mg (80 mg Intravenous Given 03/09/18 0044)  iopamidol (ISOVUE-370) 76 % injection 100 mL (100 mLs Intravenous Contrast Given 03/09/18 0155)  Initial Impression / Assessment and Plan / ED Course  I have reviewed the triage vital signs and the nursing notes.  Pertinent labs & imaging results that were available during my care of the patient were reviewed by me and considered in my medical decision making (see chart for details).     Patient presents the ED with complaints of exertional dyspnea for the past [redacted] weeks along with a headache for the past week.  Denies any red flag symptoms that be concerning for [facility], ICH, meningitis, temporal arteritis.  The patient does have history of DVT and PEs.  Currently on Coumadin.  Does report missing 2 doses several weeks ago.  She is overall well-appearing and nontoxic.  Vital signs are reassuring.  Patient is afebrile, no tachycardia, no hypotension is noted.  Patient is not hypoxic.  Is clear to auscultation bilaterally.  Heart regular rate and rhythm.  No rubs murmurs or gallops.  Trace pitting edema to the bilateral lower extremity's.  No significant JVD noted.  Lab work reveals no leukocytosis.  Hemoglobin is stable.  Electrolytes are reassuring.  Normal liver function and kidney function.  Patient's INR  is therapeutic at 2.4.  Good delta troponin.  BNP is normal.  EKG shows no change since prior tracings shows no signs of acute ischemia as reviewed by myself.  Chest x-ray reveals some mild pulmonary vascular congestion.  No signs of focal infiltrate.  CT was obtained in triage given patient's headache which was unremarkable.  Patient's headache seems consistent with occipital neuralgia given the presentation and physical exam.  Patient's headache improved with a migraine cocktail in the ED.  Discussed warm compresses and muscle relaxers at home.  Have given him neurology follow-up.  Given patient's history of DVT and PE with chest pain CTA of chest was performed.  This showed no acute PE.  It does note some cardiomegaly with mild vascular congestion consistent with a degree of pulmonary hypertension.  Does not have any significant signs of fluid overload.  Normal BNP.  No JVD.  No significant edema of the lower extremities.  Patient is not hypoxic and able to ambulate with saturation of 95%.  Patient is currently on 40 mg of Lasix a day.  Instructed him to increase his Lasix to 60 mg and follow-up with his cardiologist and primary care doctor this week.  Medical presentation not consistent with ACS, dissection, pneumonia.  Patient's heart pathway score is 3.  Pt is hemodynamically stable, in NAD, & able to ambulate in the ED. Evaluation does not show pathology that would require ongoing emergent intervention or inpatient treatment. I explained the diagnosis to the patient. Pain has been managed & has no complaints prior to dc. Pt is comfortable with above plan and is stable for discharge at this time. All questions were answered prior to disposition. Strict return precautions for f/u to the ED were discussed. Encouraged follow up with PCP.  Patient seen and evaluated my attending who is agreed with the plan.  Final Clinical Impressions(s) / ED Diagnoses   Final diagnoses:  Nonintractable  headache, unspecified chronicity pattern, unspecified headache type  SOB (shortness of breath) on exertion  Chest pain, unspecified type    ED Discharge Orders        Ordered    cyclobenzaprine (FLEXERIL) 10 MG tablet  2 times daily PRN     03/09/18 0443       Doristine Devoid, PA-C 39/03/00 [id number]    Delora Fuel, MD 00/76/22  [id number]  

## 2018-03-10 ENCOUNTER — Ambulatory Visit: Payer: BLUE CROSS/BLUE SHIELD | Admitting: Endocrinology

## 2018-03-25 ENCOUNTER — Telehealth: Payer: Self-pay | Admitting: Endocrinology

## 2018-03-25 ENCOUNTER — Ambulatory Visit (INDEPENDENT_AMBULATORY_CARE_PROVIDER_SITE_OTHER): Payer: BLUE CROSS/BLUE SHIELD | Admitting: Endocrinology

## 2018-03-25 ENCOUNTER — Encounter: Payer: Self-pay | Admitting: Endocrinology

## 2018-03-25 VITALS — BP 152/80 | HR 79 | Wt 287.6 lb

## 2018-03-25 DIAGNOSIS — Z125 Encounter for screening for malignant neoplasm of prostate: Secondary | ICD-10-CM | POA: Diagnosis not present

## 2018-03-25 DIAGNOSIS — E0590 Thyrotoxicosis, unspecified without thyrotoxic crisis or storm: Secondary | ICD-10-CM

## 2018-03-25 DIAGNOSIS — M1A9XX0 Chronic gout, unspecified, without tophus (tophi): Secondary | ICD-10-CM | POA: Diagnosis not present

## 2018-03-25 DIAGNOSIS — E119 Type 2 diabetes mellitus without complications: Secondary | ICD-10-CM

## 2018-03-25 DIAGNOSIS — Z0000 Encounter for general adult medical examination without abnormal findings: Secondary | ICD-10-CM | POA: Diagnosis not present

## 2018-03-25 LAB — POCT GLYCOSYLATED HEMOGLOBIN (HGB A1C): Hemoglobin A1C: 6.9

## 2018-03-25 MED ORDER — FLUCONAZOLE 150 MG PO TABS: 150.0000 mg | ORAL_TABLET | ORAL | 2 refills | Status: DC

## 2018-03-25 MED ORDER — BUSPIRONE HCL 15 MG PO TABS: 15.0000 mg | ORAL_TABLET | Freq: Three times a day (TID) | ORAL | 11 refills | Status: DC

## 2018-03-25 NOTE — Telephone Encounter (Signed)
please call coumadin clinic: I am prescribing "fluconazole," 1 dose.  Does coumadin need to be adjusted?

## 2018-03-25 NOTE — Progress Notes (Signed)
Subjective:    Patient ID: Spencer English, male    DOB: 10-Mar-1966, 52 y.o.   MRN: [id number]  HPI Pt states 2 mos of moderate irritation inside the mouth, but no assoc fever.  He says wife was dx'ed with yeast infection, and was rx'ed with fluconazole.   Past Medical History:  Diagnosis Date  . ALLERGIC RHINITIS 04/12/2009  . Atrial fibrillation ([place])   . BACK PAIN, LUMBAR 01/14/2011  . DEEP VENOUS THROMBOPHLEBITIS, LEG, LEFT 05/05/2009  . DM 08/08/2008  . DYSLIPIDEMIA 04/26/2009  . ECZEMA 05/23/2010  . 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 GERD (gastroesophageal reflux disease)   . GOUT 10/24/2010  . Hyperglycemia   . HYPERTENSION 07/21/2007  . 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 Leukopenia   . Long term (current) use of anticoagulants 04/03/2011  . PULMONARY EMBOLISM 05/03/2009  . UNSPECIFIED URINARY CALCULUS 07/04/2010    Past Surgical History:  Procedure Laterality Date  . ANTERIOR CRUCIATE LIGAMENT REPAIR  2000   Right  . CYSTECTOMY     back of head  . KNEE ARTHROSCOPY     left knee  . KNEE ARTHROSCOPY  01/03/2013   Procedure: ARTHROSCOPY KNEE;  Surgeon: Yvette Rack., MD;  Location: [facility];  Service: Orthopedics;  Laterality: Left;  Lavage Synovectomy, Removal of loose body  . KNEE ARTHROSCOPY W/ ACL RECONSTRUCTION     right knee    Social History   Socioeconomic History  . Marital status: Legally Separated    Spouse name: Not on file  . Number of children: 3  . Years of education: Not on file  . Highest education level: Not on file  Occupational History  . Occupation: Truck Education administrator: [employer]    Employer: [employer]  Social Needs  . Financial resource strain: Not on file  . Food insecurity:    Worry: Not on file    Inability: Not on file  . Transportation needs:    Medical: Not on file    Non-medical: Not on file  Tobacco Use  . Smoking status: Current Some Day Smoker    Types: Cigars    Last attempt to quit: 01/07/2005    Years  since quitting: 13.2  . Smokeless tobacco: Never Used  Substance and Sexual Activity  . Alcohol use: Yes    Comment: Beer everyother weekend   . Drug use: No  . Sexual activity: Not on file  Lifestyle  . Physical activity:    Days per week: Not on file    Minutes per session: Not on file  . Stress: Not on file  Relationships  . Social connections:    Talks on phone: Not on file    Gets together: Not on file    Attends religious service: Not on file    Active member of club or organization: Not on file    Attends meetings of clubs or organizations: Not on file    Relationship status: Not on file  . Intimate partner violence:    Fear of current or ex partner: Not on file    Emotionally abused: Not on file    Physically abused: Not on file    Forced sexual activity: Not on file  Other Topics Concern  . Not on file  Social History Narrative  . Not on file    Current Outpatient Medications on File Prior to Visit  Medication Sig Dispense Refill  . allopurinol (ZYLOPRIM) 300 MG tablet Take 1 tablet (  300 mg total) by mouth at bedtime. 90 tablet 1  . cyclobenzaprine (FLEXERIL) 10 MG tablet Take 1 tablet (10 mg total) by mouth 2 (two) times daily as needed. 10 tablet 0  . fluticasone (FLONASE) 50 MCG/ACT nasal spray Place 2 sprays into both nostrils daily as needed. For allergies. 16 g 11  . furosemide (LASIX) 40 MG tablet TAKE 1 TABLET BY MOUTH ONCE DAILY 30 tablet 1  . halobetasol (ULTRAVATE) 0.05 % cream APPLY CREAM TOPICALLY THREE TIMES DAILY AS NEEDED FOR RASH. 150 g 3  . isosorbide-hydrALAZINE (BIDIL) 20-37.5 MG tablet Take 0.5 tablets by mouth 3 (three) times daily. (Patient taking differently: Take 0.5 tablets by mouth 2 (two) times daily. ) 30 tablet 11  . losartan (COZAAR) 100 MG tablet Take 1 tablet (100 mg total) by mouth daily. 30 tablet 11  . methimazole (TAPAZOLE) 10 MG tablet Take 1 tablet (10 mg total) by mouth daily. (Patient taking differently: Take 10 mg by mouth  every other day. ) 90 tablet 1  . metoprolol succinate (TOPROL-XL) 25 MG 24 hr tablet TAKE ONE-HALF TABLET BY MOUTH ONCE DAILY 15 tablet 4  . sitaGLIPtin (JANUVIA) 100 MG tablet Take 1 tablet (100 mg total) by mouth daily. 30 tablet 11  . Verapamil HCl CR 300 MG CP24 Take 1 capsule (300 mg total) by mouth daily. 30 each 11  . warfarin (COUMADIN) 5 MG tablet Take as directed by Anticoagulation clinic. (Patient taking differently: Take 7.5 mg by mouth daily. Take as directed by Anticoagulation clinic.) 55 tablet 0   No current facility-administered medications on file prior to visit.     Allergies  Allergen Reactions  . Metformin Diarrhea and Nausea Only  . Viagra [Sildenafil Citrate] Other (See Comments)    headache    Family History  Problem Relation Age of Onset  . Diabetes Mother   . Hypertension Mother   . Hyperlipidemia Mother   . Diabetes Father   . Hypertension Father   . Hyperlipidemia Father   . Heart attack Father   . Heart disease Father     BP (!) 152/80 (BP Location: Left Arm, Patient Position: Sitting, Cuff Size: Normal)   Pulse 79   Wt 287 lb 9.6 oz (130.5 kg)   SpO2 96%   BMI 39.01 kg/m    Review of Systems He has lost a few lbs.  He has anxiety due to marital separation.  Little if any insomnia.      Objective:   Physical Exam VITAL SIGNS:  See vs page GENERAL: no distress Mouth: several shallow ulcers.  PSYCH: Alert and well-oriented.  Does not appear anxious nor depressed.     Lab Results  Component Value Date   HGBA1C 6.9 03/25/2018       Assessment & Plan:  Type 2 DM: well-controlled Oral ulcers, new Anxiety: new HTN: poss situational.  Recheck next time.  Patient Instructions  Please continue the same [place]. blood tests are requested for you today.  We'll let you know about the results. I have sent a prescription to your pharmacy, for fluconazole, and to add "buspirone."  We are asking coumadin clinic if it needs to be adjusted for  this.  Please call if the mouth ulcers don't resolve, so I can prescribe a different medication for this.  Please come back for a regular physical appointment in 2 months.

## 2018-03-25 NOTE — Patient Instructions (Addendum)
Please continue the same [place]. blood tests are requested for you today.  We'll let you know about the results. I have sent a prescription to your pharmacy, for fluconazole, and to add "buspirone."  We are asking coumadin clinic if it needs to be adjusted for this.  Please call if the mouth ulcers don't resolve, so I can prescribe a different medication for this.  Please come back for a regular physical appointment in 2 months.

## 2018-03-26 LAB — LIPID PANEL
CHOLESTEROL: 206 mg/dL — AB (ref 0–200)
HDL: 46.3 mg/dL (ref >39.00)
LDL Cholesterol: 143 mg/dL — ABNORMAL HIGH (ref 0–99)
NonHDL: 160.18
TRIGLYCERIDES: 86 mg/dL (ref 0.0–149.0)
Total CHOL/HDL Ratio: 4
VLDL: 17.2 mg/dL (ref 0.0–40.0)

## 2018-03-26 LAB — T4, FREE: Free T4: 0.89 ng/dL (ref 0.60–1.60)

## 2018-03-26 LAB — MICROALBUMIN / CREATININE URINE RATIO
CREATININE, U: 104.6 mg/dL
MICROALB UR: 1.9 mg/dL (ref 0.0–1.9)
Microalb Creat Ratio: 1.8 mg/g (ref 0.0–30.0)

## 2018-03-26 LAB — TSH: TSH: 1.31 u[IU]/mL (ref 0.35–4.50)

## 2018-03-26 LAB — URIC ACID: URIC ACID, SERUM: 6.3 mg/dL (ref 4.0–7.8)

## 2018-03-26 LAB — HEPATIC FUNCTION PANEL
ALT: 22 U/L (ref 0–53)
AST: 19 U/L (ref 0–37)
Albumin: 3.9 g/dL (ref 3.5–5.2)
Alkaline Phosphatase: 81 U/L (ref 39–117)
BILIRUBIN TOTAL: 0.8 mg/dL (ref 0.2–1.2)
Bilirubin, Direct: 0.2 mg/dL (ref 0.0–0.3)
Total Protein: 7 g/dL (ref 6.0–8.3)

## 2018-03-26 LAB — PSA: PSA: 1.11 ng/mL (ref 0.10–4.00)

## 2018-03-26 MED ORDER — ATORVASTATIN CALCIUM 20 MG PO TABS: 20.0000 mg | ORAL_TABLET | Freq: Every day | ORAL | 3 refills | Status: DC

## 2018-03-29 ENCOUNTER — Telehealth: Payer: Self-pay | Admitting: Endocrinology

## 2018-03-29 NOTE — Telephone Encounter (Signed)
LVM for patient & stated  He could call back with further questions.

## 2018-03-29 NOTE — Telephone Encounter (Signed)
Please call patient at ph# [phone] - he wants lab results

## 2018-04-04 ENCOUNTER — Other Ambulatory Visit: Payer: Self-pay | Admitting: Cardiology

## 2018-04-04 ENCOUNTER — Telehealth: Payer: Self-pay | Admitting: Cardiology

## 2018-04-04 MED ORDER — WARFARIN SODIUM 5 MG PO TABS: ORAL_TABLET | ORAL | 0 refills | Status: DC

## 2018-04-04 NOTE — Telephone Encounter (Signed)
Pt out of coumadin, sent in refill.  I asked him to call Monday for appt in coumadin clinic.

## 2018-04-06 NOTE — Telephone Encounter (Signed)
Person at number listed is also Mr. Spencer English, but does not take warfarin or know who Dr. Allred/Coumadin clinic is. Confirmed number and he states that is number dialed, but not patient listed.   Will try spouses phone to reach patient. LMOM to have patient call back.

## 2018-04-08 NOTE — Telephone Encounter (Signed)
Will try again.

## 2018-04-08 NOTE — Telephone Encounter (Signed)
Spoke with Sharyn Lull, Emergency contact who states pt number is [phone]. Will try this number asked that she ask him to call us.  Tried his number and unable to leave message as VM box not set up.

## 2018-04-09 NOTE — Telephone Encounter (Signed)
Tried phone number again to set up appt.

## 2018-04-13 NOTE — Telephone Encounter (Signed)
Spoke with patient and made appt for 04/21/18 at 330.

## 2018-05-26 ENCOUNTER — Ambulatory Visit: Payer: BLUE CROSS/BLUE SHIELD | Admitting: Endocrinology

## 2018-05-26 DIAGNOSIS — Z0289 Encounter for other administrative examinations: Secondary | ICD-10-CM

## 2018-05-27 ENCOUNTER — Ambulatory Visit (INDEPENDENT_AMBULATORY_CARE_PROVIDER_SITE_OTHER): Payer: BLUE CROSS/BLUE SHIELD | Admitting: *Deleted

## 2018-05-27 DIAGNOSIS — Z7901 Long term (current) use of anticoagulants: Secondary | ICD-10-CM | POA: Diagnosis not present

## 2018-05-27 DIAGNOSIS — I2782 Chronic pulmonary embolism: Secondary | ICD-10-CM | POA: Diagnosis not present

## 2018-05-27 DIAGNOSIS — I82402 Acute embolism and thrombosis of unspecified deep veins of left lower extremity: Secondary | ICD-10-CM

## 2018-05-27 LAB — POCT INR: INR: 1.8 — AB (ref 2.0–3.0)

## 2018-05-27 MED ORDER — WARFARIN SODIUM 5 MG PO TABS
ORAL_TABLET | ORAL | 0 refills | Status: DC
Start: 2018-05-27 — End: 2018-05-27

## 2018-05-27 MED ORDER — WARFARIN SODIUM 5 MG PO TABS: ORAL_TABLET | ORAL | 0 refills | Status: DC

## 2018-05-27 NOTE — Patient Instructions (Addendum)
Description   Since you have already taken 2 tablets today then continue taking 1.5 tablets everyday except 2 tablets on Mondays, Wednesdays and Fridays. Please remember to adhere to appts and that there are no walk-ins. Recheck in 2 weeks.  Call with any medication changes or procedures [phone]

## 2018-06-02 ENCOUNTER — Other Ambulatory Visit: Payer: Self-pay | Admitting: Endocrinology

## 2018-06-02 NOTE — Telephone Encounter (Signed)
Need a prescription for the metoprolol succinate (TOPROL-XL) 25 MG 24 hr tablet [[id number]]  [facility]  [phone]

## 2018-06-24 ENCOUNTER — Other Ambulatory Visit: Payer: Self-pay | Admitting: Internal Medicine

## 2018-06-29 ENCOUNTER — Ambulatory Visit (INDEPENDENT_AMBULATORY_CARE_PROVIDER_SITE_OTHER): Payer: BLUE CROSS/BLUE SHIELD | Admitting: *Deleted

## 2018-06-29 DIAGNOSIS — I2782 Chronic pulmonary embolism: Secondary | ICD-10-CM | POA: Diagnosis not present

## 2018-06-29 DIAGNOSIS — I82402 Acute embolism and thrombosis of unspecified deep veins of left lower extremity: Secondary | ICD-10-CM

## 2018-06-29 DIAGNOSIS — Z7901 Long term (current) use of anticoagulants: Secondary | ICD-10-CM | POA: Diagnosis not present

## 2018-06-29 LAB — POCT INR: INR: 2.3 (ref 2.0–3.0)

## 2018-06-29 NOTE — Patient Instructions (Signed)
Description   Continue taking 1.5 tablets everyday except 2 tablets on Mondays, Wednesdays and Fridays.  Recheck in 4 weeks.  Call with any medication changes or procedures [phone]

## 2018-07-14 ENCOUNTER — Other Ambulatory Visit: Payer: Self-pay | Admitting: Endocrinology

## 2018-07-14 MED ORDER — FLUCONAZOLE 150 MG PO TABS: 150.0000 mg | ORAL_TABLET | ORAL | 2 refills | Status: DC

## 2018-07-20 DIAGNOSIS — J069 Acute upper respiratory infection, unspecified: Secondary | ICD-10-CM | POA: Diagnosis not present

## 2018-07-29 ENCOUNTER — Emergency Department (HOSPITAL_COMMUNITY): Payer: BLUE CROSS/BLUE SHIELD

## 2018-07-29 ENCOUNTER — Emergency Department (HOSPITAL_COMMUNITY)
Admission: EM | Admit: 2018-07-29 | Discharge: 2018-07-30 | Disposition: A | Discharge status: Home / Self Care | Payer: BLUE CROSS/BLUE SHIELD | Attending: Emergency Medicine | Admitting: Emergency Medicine

## 2018-07-29 ENCOUNTER — Encounter (HOSPITAL_COMMUNITY): Payer: Self-pay | Admitting: Emergency Medicine

## 2018-07-29 ENCOUNTER — Other Ambulatory Visit: Payer: Self-pay

## 2018-07-29 DIAGNOSIS — T378X5A Adverse effect of other specified systemic anti-infectives and antiparasitics, initial encounter: Secondary | ICD-10-CM | POA: Diagnosis not present

## 2018-07-29 DIAGNOSIS — R531 Weakness: Secondary | ICD-10-CM | POA: Diagnosis present

## 2018-07-29 DIAGNOSIS — Z7901 Long term (current) use of anticoagulants: Secondary | ICD-10-CM | POA: Insufficient documentation

## 2018-07-29 DIAGNOSIS — R062 Wheezing: Secondary | ICD-10-CM | POA: Diagnosis not present

## 2018-07-29 DIAGNOSIS — Z79899 Other long term (current) drug therapy: Secondary | ICD-10-CM | POA: Diagnosis not present

## 2018-07-29 DIAGNOSIS — F17290 Nicotine dependence, other tobacco product, uncomplicated: Secondary | ICD-10-CM | POA: Diagnosis not present

## 2018-07-29 DIAGNOSIS — T363X5A Adverse effect of macrolides, initial encounter: Secondary | ICD-10-CM | POA: Diagnosis not present

## 2018-07-29 DIAGNOSIS — R0602 Shortness of breath: Secondary | ICD-10-CM | POA: Diagnosis not present

## 2018-07-29 DIAGNOSIS — T7840XA Allergy, unspecified, initial encounter: Secondary | ICD-10-CM | POA: Insufficient documentation

## 2018-07-29 DIAGNOSIS — Z7984 Long term (current) use of oral hypoglycemic drugs: Secondary | ICD-10-CM | POA: Insufficient documentation

## 2018-07-29 DIAGNOSIS — E119 Type 2 diabetes mellitus without complications: Secondary | ICD-10-CM | POA: Diagnosis not present

## 2018-07-29 DIAGNOSIS — J209 Acute bronchitis, unspecified: Secondary | ICD-10-CM | POA: Diagnosis not present

## 2018-07-29 DIAGNOSIS — I4891 Unspecified atrial fibrillation: Secondary | ICD-10-CM | POA: Diagnosis not present

## 2018-07-29 MED ORDER — IPRATROPIUM-ALBUTEROL 0.5-2.5 (3) MG/3ML IN SOLN
3.0000 mL | Freq: Once | RESPIRATORY_TRACT | Status: AC
  Administered 2018-07-30: 3 mL via RESPIRATORY_TRACT
  Filled 2018-07-29: qty 3

## 2018-07-29 NOTE — ED Triage Notes (Addendum)
Pt from home with c/o yellow sclera and SOB x 2 days following taking a zpack and fluconazole x 1 week. Pt has clear lungs, oxygen at 100% RA and is able to speak in complete sentences with no difficulty.  Pt states he was being treated for a sinus infection. Pt states he feels like he is having trouble breathing due to phlegm.

## 2018-07-30 DIAGNOSIS — R0602 Shortness of breath: Secondary | ICD-10-CM | POA: Diagnosis not present

## 2018-07-30 MED ORDER — PREDNISONE 10 MG PO TABS: 50.0000 mg | ORAL_TABLET | Freq: Every day | ORAL | 0 refills | Status: DC

## 2018-07-30 MED ORDER — ALBUTEROL SULFATE HFA 108 (90 BASE) MCG/ACT IN AERS: 1.0000 | INHALATION_SPRAY | Freq: Four times a day (QID) | RESPIRATORY_TRACT | 0 refills | Status: DC | PRN

## 2018-07-30 NOTE — Discharge Instructions (Addendum)
We suspect that your symptoms were either due to bronchitis or allergic reaction. The treatment for both will be same -prednisone to calm immune system and albuterol to open up your lungs.  Refrain from smoking or exposure to smoke. Return to the ER if you start having worsening shortness of breath, dizziness, fainting, worsening chest pain.

## 2018-07-30 NOTE — ED Notes (Signed)
EKG given to [facility]. For review.

## 2018-08-02 ENCOUNTER — Telehealth: Payer: Self-pay | Admitting: Endocrinology

## 2018-08-02 ENCOUNTER — Other Ambulatory Visit: Payer: Self-pay

## 2018-08-02 NOTE — Telephone Encounter (Signed)
Patient was in the hospital due to breathing complication and allergic reaction to a medication. They want the patient to follow up with Dr Loanne Drilling in a week. Next available we have is not until the 21st.   Please advise

## 2018-08-02 NOTE — Telephone Encounter (Signed)
2:15 PM, 08/13/18

## 2018-08-02 NOTE — Telephone Encounter (Signed)
LVM for patient to call back to see if he can make this appointment time & I will make sure it gets scheduled.

## 2018-08-04 ENCOUNTER — Other Ambulatory Visit: Payer: Self-pay | Admitting: Internal Medicine

## 2018-08-04 NOTE — ED Provider Notes (Signed)
[facility] DEPT Provider Note   CSN: [id number] Arrival date & time: 07/29/18  2027     History   Chief Complaint Chief Complaint  Patient presents with  . Medication Reaction    HPI Spencer English is a 52 y.o. male.  HPI 52 year old male comes in with chief complaint of allergic reaction.  Patient states that he had recent URI-like symptoms and was given Z-Pak and fluconazole.  Patient finished his fluconazole a week ago and the antibiotics few days ago.  He has noted that over time he has had yellowing of his sclera and also some difficulty in breathing.  Patient at one point felt like he was going to faint.  Patient has history of A. fib, hypertension, hyperlipidemia, PE. Patient is on Coumadin, furosemide and has been taking his medications as prescribed.  He denies any palpitations.  He also has history of diabetes and has been taking those medications as prescribed.  Past Medical History:  Diagnosis Date  . ALLERGIC RHINITIS 04/12/2009  . Atrial fibrillation ([place])   . BACK PAIN, LUMBAR 01/14/2011  . DEEP VENOUS THROMBOPHLEBITIS, LEG, LEFT 05/05/2009  . DM 08/08/2008  . DYSLIPIDEMIA 04/26/2009  . ECZEMA 05/23/2010  . 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 GERD (gastroesophageal reflux disease)   . GOUT 10/24/2010  . Hyperglycemia   . HYPERTENSION 07/21/2007  . 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 Leukopenia   . Long term (current) use of anticoagulants 04/03/2011  . PULMONARY EMBOLISM 05/03/2009  . UNSPECIFIED URINARY CALCULUS 07/04/2010    Patient Active Problem List   Diagnosis Date Noted  . OSA on CPAP 06/04/2017  . Obese 06/04/2017  . Wellness examination 09/05/2015  . Screening for cancer 05/15/2015  . Sleep disorder 06/27/2014  . Moderate mitral regurgitation 06/26/2014  . Screening for prostate cancer 10/10/2013  . Pulmonary edema 02/01/2013  . Hyperthyroidism 01/27/2013  . Moderate to severe pulmonary hypertension ([place]) 01/26/2013  .  Syncope 01/25/2013  . Microcytic anemia 01/25/2013  . Thrombocytopenia ([place]) 01/25/2013  . Hypotension due to drugs 01/23/2013  . History of septic arthritis 01/23/2013  . Atrial fibrillation ([place])   . Palpitations 11/03/2012  . Encounter for long-term (current) use of other medications 09/06/2012  . Pre-employment examination 10/11/2011  . Screening examination for infectious disease 06/16/2011  . Encounter for long-term (current) use of anticoagulants 04/03/2011  . WHEEZING 01/31/2011  . BACK PAIN, LUMBAR 01/14/2011  . Gout 10/24/2010  . UNSPECIFIED URINARY CALCULUS 07/04/2010  . ECZEMA 05/23/2010  . EDEMA 10/25/2009  . 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 DVT (deep venous thrombosis) ([place]) 05/05/2009  . FOOT PAIN, LEFT 05/05/2009  . Chronic pulmonary embolism ([place]) 05/03/2009  . DYSLIPIDEMIA 04/26/2009  . ALLERGIC RHINITIS 04/12/2009  . Pain in joint, ankle and foot 04/12/2009  . Diabetes ([place]) 08/08/2008  . 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 SEBACEOUS CYST 01/31/2008  . Essential hypertension 07/21/2007    Past Surgical History:  Procedure Laterality Date  . ANTERIOR CRUCIATE LIGAMENT REPAIR  2000   Right  . CYSTECTOMY     back of head  . KNEE ARTHROSCOPY     left knee  . KNEE ARTHROSCOPY  01/03/2013   Procedure: ARTHROSCOPY KNEE;  Surgeon: Yvette Rack., MD;  Location: [facility];  Service: Orthopedics;  Laterality: Left;  Lavage Synovectomy, Removal of loose body  . KNEE ARTHROSCOPY W/ ACL RECONSTRUCTION     right knee        Home Medications    Prior to Admission medications  Medication Sig Start Date End Date Taking? Authorizing Provider  albuterol (PROVENTIL HFA;VENTOLIN HFA) 108 (90 Base) MCG/ACT inhaler Inhale 1-2 puffs into the lungs every 6 (six) hours as needed for wheezing or shortness of breath. 07/30/18   Varney Biles, MD  allopurinol (ZYLOPRIM) 300 MG tablet Take 1 tablet (300 mg total) by mouth at bedtime. 05/26/17   Renato Shin, MD  atorvastatin  (LIPITOR) 20 MG tablet Take 1 tablet (20 mg total) by mouth daily. 03/26/18   Renato Shin, MD  busPIRone (BUSPAR) 15 MG tablet Take 1 tablet (15 mg total) by mouth 3 (three) times daily. 03/25/18   Renato Shin, MD  cyclobenzaprine (FLEXERIL) 10 MG tablet Take 1 tablet (10 mg total) by mouth 2 (two) times daily as needed. 03/09/18   Doristine Devoid, PA-C  fluconazole (DIFLUCAN) 150 MG tablet Take 1 tablet (150 mg total) by mouth once a week. 07/14/18   Renato Shin, MD  fluticasone [facility]) 50 MCG/ACT nasal spray Place 2 sprays into both nostrils daily as needed. For allergies. 05/20/17   Renato Shin, MD  furosemide (LASIX) 40 MG tablet TAKE 1 TABLET BY MOUTH ONCE DAILY 01/22/18   Renato Shin, MD  halobetasol (ULTRAVATE) 0.05 % cream APPLY CREAM TOPICALLY THREE TIMES DAILY AS NEEDED FOR RASH. 10/07/17   Renato Shin, MD  isosorbide-hydrALAZINE (BIDIL) 20-37.5 MG tablet Take 0.5 tablets by mouth 3 (three) times daily. Patient taking differently: Take 0.5 tablets by mouth 2 (two) times daily.  01/25/18   Renato Shin, MD  losartan (COZAAR) 100 MG tablet Take 1 tablet (100 mg total) by mouth daily. 12/10/17   Renato Shin, MD  methimazole (TAPAZOLE) 10 MG tablet TAKE 1 TABLET BY MOUTH ONCE DAILY 07/14/18   Renato Shin, MD  metoprolol succinate (TOPROL-XL) 25 MG 24 hr tablet TAKE ONE-HALF TABLET BY MOUTH ONCE DAILY 04/10/17   Renato Shin, MD  metoprolol succinate (TOPROL-XL) 25 MG 24 hr tablet TAKE ONE-HALF TABLET(12.5MG  TOTAL) BY MOUTH DAILY 06/02/18   Renato Shin, MD  predniSONE (DELTASONE) 10 MG tablet Take 5 tablets (50 mg total) by mouth daily. 07/30/18   Varney Biles, MD  sitaGLIPtin (JANUVIA) 100 MG tablet Take 1 tablet (100 mg total) by mouth daily. 10/07/17   Renato Shin, MD  Verapamil HCl CR 300 MG CP24 Take 1 capsule (300 mg total) by mouth daily. 10/07/17   Renato Shin, MD  warfarin (COUMADIN) 5 MG tablet TAKE AS DIRECTED 06/29/18   Thompson Grayer, MD    Family History Family  History  Problem Relation Age of Onset  . Diabetes Mother   . Hypertension Mother   . Hyperlipidemia Mother   . Diabetes Father   . Hypertension Father   . Hyperlipidemia Father   . Heart attack Father   . Heart disease Father     Social History Social History   Tobacco Use  . Smoking status: Current Some Day Smoker    Types: Cigars    Last attempt to quit: 01/07/2005    Years since quitting: 13.5  . Smokeless tobacco: Never Used  Substance Use Topics  . Alcohol use: Yes    Comment: Beer everyother weekend   . Drug use: No     Allergies   Metformin and Viagra [sildenafil citrate]   Review of Systems Review of Systems  Constitutional: Positive for activity change.  Respiratory: Positive for shortness of breath and wheezing.   Gastrointestinal: Negative for nausea and vomiting.  Neurological: Positive for dizziness.     Physical Exam  Updated Vital Signs BP 130/80 (BP Location: Right Arm)   Pulse 70   Temp 98.1 F (36.7 C) (Oral)   Resp 20   SpO2 100%   Physical Exam  Constitutional: He is oriented to person, place, and time. He appears well-developed.  HENT:  Head: Atraumatic.  Eyes: No scleral icterus.  Neck: Neck supple.  Cardiovascular: Normal rate.  Pulmonary/Chest: Effort normal. He has no wheezes. He has no rales.  Abdominal: Soft.  Musculoskeletal: He exhibits no edema, tenderness or deformity.  Neurological: He is alert and oriented to person, place, and time.  Skin: Skin is warm.  Nursing note and vitals reviewed.    ED Treatments / Results  Labs (all labs ordered are listed, but only abnormal results are displayed) Labs Reviewed - No data to display  EKG EKG Interpretation  Date/Time:  Friday July 30 2018 00:02:28 EDT Ventricular Rate:  70 PR Interval:    QRS Duration: 90 QT Interval:  430 QTC Calculation: 464 R Axis:   -13 Text Interpretation:  Sinus rhythm Nonspecific T abnormalities, lateral leads since last tracing no  significant change Confirmed by Daleen Bo [phone]) on 07/31/2018 12:40:03 PM   Radiology No results found.  Procedures Procedures (including critical care time)  Medications Ordered in ED Medications  ipratropium-albuterol (DUONEB) 0.5-2.5 (3) MG/3ML nebulizer solution 3 mL (3 mLs Nebulization Given 07/30/18 0003)     Initial Impression / Assessment and Plan / ED Course  I have reviewed the triage vital signs and the nursing notes.  Pertinent labs & imaging results that were available during my care of the patient were reviewed by me and considered in my medical decision making (see chart for details).     52 year old male comes in with chief complaint of wheezing and weakness.  He is also noted may be yellowing if his sclera. Patient has multiple medical comorbidities including diabetes, PE, DVT and is on Coumadin.  Patient has no known allergies.  His symptoms started after he took fluconazole and Z-Pak for a sinusitis and upper respiratory infection.  Patient is noted to have 100% O2 sats on room air, pulse is normal, heart rate is also normal and patient is afebrile.  Patient does not appear to be grossly icteric to me, and there is no abdominal tenderness.   Exam at the moment is reassuring. I informed patient that there is a possibility that he might have had a mild allergic reaction, but since he has stopped taking the medications over the past now 2 to 3 days, his symptoms should improve gradually if this was indeed an allergic reaction.  I also discussed with him that given that he has history of PE, DVT -there is always a possibility of his symptoms being because of worsening PE.  Patient has been compliant with his Coumadin and his INR is normally well controlled.  He is comfortable with the plan for conservative management right now with him returning to the ER if he notices persistent symptoms of shortness of breath or has worsening shortness of breath or fainting spell  -which if they were to occur we have to consider PE.  Final Clinical Impressions(s) / ED Diagnoses   Final diagnoses:  Allergic reaction, initial encounter  Acute bronchitis, unspecified organism    ED Discharge Orders        Ordered    predniSONE (DELTASONE) 10 MG tablet  Daily     07/30/18 0408    albuterol (PROVENTIL HFA;VENTOLIN HFA) 108 (90 Base) MCG/ACT  inhaler  Every 6 hours PRN     07/30/18 0408       Varney Biles, MD 08/04/18 1342

## 2018-08-07 ENCOUNTER — Telehealth: Payer: Self-pay | Admitting: Physician Assistant

## 2018-08-07 MED ORDER — WARFARIN SODIUM 5 MG PO TABS: ORAL_TABLET | ORAL | 0 refills | Status: DC

## 2018-08-07 NOTE — Telephone Encounter (Signed)
The patient called the answering service after-hours this morning. He was out of town so missed most recent coumadin clinic appointment. He realized he has no Coumadin left today. He has been taking 1.5 tablets everyday except 2 tablets on Mondays, Wednesdays and Fridays as directed by last check on 06/29/18 and will continue this dose. No bleeding, new updates in health or complications reported. Last filled #50 with zero refills on 06/29/18 (not sure why short term refill was sent in). I will send in another short-term refill and forward to coumadin clinic for review of further refills and to reschedule pt for INR check. The patient verbalized understanding and gratitude.  Dayna Dunn PA-C

## 2018-08-09 NOTE — Telephone Encounter (Signed)
Number on file is not patient number, verified that it was the number I called. Will remove number from chart.

## 2018-08-09 NOTE — Telephone Encounter (Signed)
LMOM to call as patient is overdue for follow up. Will need to schedule follow up appt.

## 2018-08-13 ENCOUNTER — Telehealth: Payer: Self-pay

## 2018-08-13 NOTE — Telephone Encounter (Signed)
Best options are miralax and stool softener.  You may need to take 3-4 times a day, to get these to work.  You can take both if you want.

## 2018-08-13 NOTE — Telephone Encounter (Signed)
patient called today and stated he has been extremely constipated for a week- he is scheduled to come in for f/u on 08/17/18 but would like to know if MD can recommend something for the constipation or order a prescription please advise

## 2018-08-16 ENCOUNTER — Telehealth: Payer: Self-pay | Admitting: Pharmacist

## 2018-08-16 NOTE — Telephone Encounter (Signed)
There was no phone number in patient's chart besides the phone number for his wife. I called LVM with her asking her to relay message for him to give [place] a call back.

## 2018-08-16 NOTE — Telephone Encounter (Signed)
-----   Message from [facility], [place] sent at 08/11/2018 12:53 PM EDT ----- Regarding: RE: Anticoagulation Looks like historically (years ago) Dr. Rayann Heman had suggested xarelto, patient preferred not to change.     Renee  ----- Message ----- From: Erskine Emery, [facility] Sent: 08/09/2018   2:28 PM EDT To: Thompson Grayer, MD, [place], PA-C Subject: Anticoagulation                                Dr. Ewell Poe,  Mr. Kotch is overdue to follow up for his warfarin. We have had a hard time keeping on top of checking his INR (I think multifactoral). I was reviewing his chart and wondered if an DOAC (Xarelto or Eliquis) might be appropriate for him. 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 Please advise if this change may be appropriate. Thank you!  Georgina Peer

## 2018-08-16 NOTE — Telephone Encounter (Signed)
Will plan to change to Xarelto to reduce need for follow up as will not need to monitor INR with change. He has been noncompliant with follow ups to check INR.

## 2018-08-17 ENCOUNTER — Ambulatory Visit: Payer: BLUE CROSS/BLUE SHIELD | Admitting: Endocrinology

## 2018-08-17 ENCOUNTER — Encounter: Payer: Self-pay | Admitting: Endocrinology

## 2018-08-17 VITALS — BP 136/88 | HR 79 | Ht 72.0 in | Wt 274.2 lb

## 2018-08-17 DIAGNOSIS — K5900 Constipation, unspecified: Secondary | ICD-10-CM | POA: Diagnosis not present

## 2018-08-17 DIAGNOSIS — E0590 Thyrotoxicosis, unspecified without thyrotoxic crisis or storm: Secondary | ICD-10-CM | POA: Diagnosis not present

## 2018-08-17 DIAGNOSIS — E119 Type 2 diabetes mellitus without complications: Secondary | ICD-10-CM

## 2018-08-17 LAB — URINALYSIS, ROUTINE W REFLEX MICROSCOPIC
Bilirubin Urine: NEGATIVE
Hgb urine dipstick: NEGATIVE
KETONES UR: NEGATIVE
Leukocytes, UA: NEGATIVE
Nitrite: NEGATIVE
PH: 6 (ref 5.0–8.0)
RBC / HPF: NONE SEEN (ref 0–?)
SPECIFIC GRAVITY, URINE: 1.02 (ref 1.000–1.030)
Total Protein, Urine: NEGATIVE
Urine Glucose: NEGATIVE
Urobilinogen, UA: 0.2 (ref 0.0–1.0)

## 2018-08-17 LAB — HEPATIC FUNCTION PANEL
ALT: 18 U/L (ref 0–53)
AST: 14 U/L (ref 0–37)
Albumin: 3.8 g/dL (ref 3.5–5.2)
Alkaline Phosphatase: 64 U/L (ref 39–117)
BILIRUBIN DIRECT: 0.2 mg/dL (ref 0.0–0.3)
TOTAL PROTEIN: 6.6 g/dL (ref 6.0–8.3)
Total Bilirubin: 1.2 mg/dL (ref 0.2–1.2)

## 2018-08-17 LAB — CBC WITH DIFFERENTIAL/PLATELET
BASOS PCT: 1.2 % (ref 0.0–3.0)
Basophils Absolute: 0.1 10*3/uL (ref 0.0–0.1)
Eosinophils Absolute: 0.2 10*3/uL (ref 0.0–0.7)
Eosinophils Relative: 5.8 % — ABNORMAL HIGH (ref 0.0–5.0)
HCT: 42.1 % (ref 39.0–52.0)
Hemoglobin: 13.6 g/dL (ref 13.0–17.0)
LYMPHS ABS: 0.9 10*3/uL (ref 0.7–4.0)
Lymphocytes Relative: 22.4 % (ref 12.0–46.0)
MCHC: 32.3 g/dL (ref 30.0–36.0)
MCV: 85.1 fl (ref 78.0–100.0)
MONO ABS: 0.4 10*3/uL (ref 0.1–1.0)
Monocytes Relative: 10.5 % (ref 3.0–12.0)
NEUTROS ABS: 2.5 10*3/uL (ref 1.4–7.7)
NEUTROS PCT: 60.1 % (ref 43.0–77.0)
PLATELETS: 173 10*3/uL (ref 150.0–400.0)
RBC: 4.95 Mil/uL (ref 4.22–5.81)
RDW: 16.7 % — AB (ref 11.5–15.5)
WBC: 4.2 10*3/uL (ref 4.0–10.5)

## 2018-08-17 LAB — POCT GLYCOSYLATED HEMOGLOBIN (HGB A1C): HEMOGLOBIN A1C: 6.5 % — AB (ref 4.0–5.6)

## 2018-08-17 LAB — BASIC METABOLIC PANEL
BUN: 7 mg/dL (ref 6–23)
CALCIUM: 8.9 mg/dL (ref 8.4–10.5)
CO2: 28 mEq/L (ref 19–32)
Chloride: 106 mEq/L (ref 96–112)
Creatinine, Ser: 1.19 mg/dL (ref 0.40–1.50)
GFR: 82.2 mL/min (ref >60.00)
GLUCOSE: 134 mg/dL — AB (ref 70–99)
Potassium: 4.2 mEq/L (ref 3.5–5.1)
SODIUM: 140 meq/L (ref 135–145)

## 2018-08-17 LAB — TSH: TSH: 1.52 u[IU]/mL (ref 0.35–4.50)

## 2018-08-17 NOTE — Patient Instructions (Addendum)
Please let me know if you want a prescription, for "lactulose," for the bowels. blood tests are requested for you today.  We'll let you know about the results. Please come back for a follow-up appointment in 6 months, for the diabetes and thyroid

## 2018-08-17 NOTE — Progress Notes (Signed)
Subjective:    Patient ID: Spencer English, male    DOB: 01-12-1966, 52 y.o.   MRN: [id number]  HPI Pt states 2 weeks of constipation, but no pain at the abdomen.  No assoc BRBPR.  No help with miralax.   Past Medical History:  Diagnosis Date  . ALLERGIC RHINITIS 04/12/2009  . Atrial fibrillation ([place])   . BACK PAIN, LUMBAR 01/14/2011  . DEEP VENOUS THROMBOPHLEBITIS, LEG, LEFT 05/05/2009  . DM 08/08/2008  . DYSLIPIDEMIA 04/26/2009  . ECZEMA 05/23/2010  . 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 GERD (gastroesophageal reflux disease)   . GOUT 10/24/2010  . Hyperglycemia   . HYPERTENSION 07/21/2007  . 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 Leukopenia   . Long term (current) use of anticoagulants 04/03/2011  . PULMONARY EMBOLISM 05/03/2009  . UNSPECIFIED URINARY CALCULUS 07/04/2010    Past Surgical History:  Procedure Laterality Date  . ANTERIOR CRUCIATE LIGAMENT REPAIR  2000   Right  . CYSTECTOMY     back of head  . KNEE ARTHROSCOPY     left knee  . KNEE ARTHROSCOPY  01/03/2013   Procedure: ARTHROSCOPY KNEE;  Surgeon: Yvette Rack., MD;  Location: [facility];  Service: Orthopedics;  Laterality: Left;  Lavage Synovectomy, Removal of loose body  . KNEE ARTHROSCOPY W/ ACL RECONSTRUCTION     right knee    Social History   Socioeconomic History  . Marital status: Legally Separated    Spouse name: Not on file  . Number of children: 3  . Years of education: Not on file  . Highest education level: Not on file  Occupational History  . Occupation: Truck Education administrator: [employer]    Employer: [employer]  Social Needs  . Financial resource strain: Not on file  . Food insecurity:    Worry: Not on file    Inability: Not on file  . Transportation needs:    Medical: Not on file    Non-medical: Not on file  Tobacco Use  . Smoking status: Current Some Day Smoker    Types: Cigars    Last attempt to quit: 01/07/2005    Years since quitting: 13.6  . Smokeless tobacco: Never  Used  Substance and Sexual Activity  . Alcohol use: Yes    Comment: Beer everyother weekend   . Drug use: No  . Sexual activity: Not on file  Lifestyle  . Physical activity:    Days per week: Not on file    Minutes per session: Not on file  . Stress: Not on file  Relationships  . Social connections:    Talks on phone: Not on file    Gets together: Not on file    Attends religious service: Not on file    Active member of club or organization: Not on file    Attends meetings of clubs or organizations: Not on file    Relationship status: Not on file  . Intimate partner violence:    Fear of current or ex partner: Not on file    Emotionally abused: Not on file    Physically abused: Not on file    Forced sexual activity: Not on file  Other Topics Concern  . Not on file  Social History Narrative  . Not on file    Current Outpatient Medications on File Prior to Visit  Medication Sig Dispense Refill  . albuterol (PROVENTIL HFA;VENTOLIN HFA) 108 (90 Base) MCG/ACT inhaler Inhale 1-2 puffs into the lungs  every 6 (six) hours as needed for wheezing or shortness of breath. 1 Inhaler 0  . allopurinol (ZYLOPRIM) 300 MG tablet Take 1 tablet (300 mg total) by mouth at bedtime. 90 tablet 1  . atorvastatin (LIPITOR) 20 MG tablet Take 1 tablet (20 mg total) by mouth daily. 90 tablet 3  . busPIRone (BUSPAR) 15 MG tablet Take 1 tablet (15 mg total) by mouth 3 (three) times daily. 60 tablet 11  . fluticasone (FLONASE) 50 MCG/ACT nasal spray Place 2 sprays into both nostrils daily as needed. For allergies. 16 g 11  . furosemide (LASIX) 40 MG tablet TAKE 1 TABLET BY MOUTH ONCE DAILY 30 tablet 1  . halobetasol (ULTRAVATE) 0.05 % cream APPLY CREAM TOPICALLY THREE TIMES DAILY AS NEEDED FOR RASH. 150 g 3  . isosorbide-hydrALAZINE (BIDIL) 20-37.5 MG tablet Take 0.5 tablets by mouth 3 (three) times daily. (Patient taking differently: Take 0.5 tablets by mouth 2 (two) times daily. ) 30 tablet 11  . losartan  (COZAAR) 100 MG tablet Take 1 tablet (100 mg total) by mouth daily. 30 tablet 11  . methimazole (TAPAZOLE) 10 MG tablet TAKE 1 TABLET BY MOUTH ONCE DAILY 90 tablet 0  . metoprolol succinate (TOPROL-XL) 25 MG 24 hr tablet TAKE ONE-HALF TABLET(12.5MG  TOTAL) BY MOUTH DAILY 45 tablet 0  . sitaGLIPtin (JANUVIA) 100 MG tablet Take 1 tablet (100 mg total) by mouth daily. 30 tablet 11  . Verapamil HCl CR 300 MG CP24 Take 1 capsule (300 mg total) by mouth daily. 30 each 11  . warfarin (COUMADIN) 5 MG tablet Take by mouth as directed by Coumadin Clinic 50 tablet 0  . cyclobenzaprine (FLEXERIL) 10 MG tablet Take 1 tablet (10 mg total) by mouth 2 (two) times daily as needed. (Patient not taking: Reported on 08/17/2018) 10 tablet 0  . fluconazole (DIFLUCAN) 150 MG tablet Take 1 tablet (150 mg total) by mouth once a week. (Patient not taking: Reported on 08/17/2018) 1 tablet 2  . metoprolol succinate (TOPROL-XL) 25 MG 24 hr tablet TAKE ONE-HALF TABLET BY MOUTH ONCE DAILY (Patient not taking: Reported on 08/17/2018) 15 tablet 4  . predniSONE (DELTASONE) 10 MG tablet Take 5 tablets (50 mg total) by mouth daily. (Patient not taking: Reported on 08/17/2018) 25 tablet 0   No current facility-administered medications on file prior to visit.     Allergies  Allergen Reactions  . Metformin Diarrhea and Nausea Only  . Viagra [Sildenafil Citrate] Other (See Comments)    headache    Family History  Problem Relation Age of Onset  . Diabetes Mother   . Hypertension Mother   . Hyperlipidemia Mother   . Diabetes Father   . Hypertension Father   . Hyperlipidemia Father   . Heart attack Father   . Heart disease Father     BP 136/88 (BP Location: Left Arm, Patient Position: Sitting, Cuff Size: Normal)   Pulse 79   Ht 6' (1.829 m)   Wt 274 lb 3.2 oz (124.4 kg)   SpO2 97%   BMI 37.19 kg/m    Review of Systems He took prednisone from the ER, for allergic reaction--those sxs are resolved.  He still has yellow  urine, though (? Of icterus was noted in ER).      Objective:   Physical Exam VITAL SIGNS:  See vs page GENERAL: no distress Pulses: dorsalis pedis intact bilat.   MSK: no deformity of the feet CV: 1+ bilat leg edema, and bilat vv's.  Skin:  no  ulcer on the feet.  normal color and temp on the feet. Neuro: sensation is intact to touch on the feet Ext: There is bilateral onychomycosis of the toenails  Lab Results  Component Value Date   HGBA1C 6.5 (A) 08/17/2018       Assessment & Plan:  Constipation: new Type 2 DM: well-controlled Allergic rxn: prednisone is affecting a1c--we'll follow   Patient Instructions  Please let me know if you want a prescription, for "lactulose," for the bowels. blood tests are requested for you today.  We'll let you know about the results. Please come back for a follow-up appointment in 6 months, for the diabetes and thyroid

## 2018-08-23 ENCOUNTER — Ambulatory Visit (INDEPENDENT_AMBULATORY_CARE_PROVIDER_SITE_OTHER): Payer: BLUE CROSS/BLUE SHIELD

## 2018-08-23 ENCOUNTER — Encounter: Payer: Self-pay | Admitting: Podiatry

## 2018-08-23 ENCOUNTER — Ambulatory Visit: Payer: BLUE CROSS/BLUE SHIELD | Admitting: Podiatry

## 2018-08-23 ENCOUNTER — Other Ambulatory Visit: Payer: Self-pay | Admitting: Podiatry

## 2018-08-23 DIAGNOSIS — M779 Enthesopathy, unspecified: Secondary | ICD-10-CM

## 2018-08-23 DIAGNOSIS — B351 Tinea unguium: Secondary | ICD-10-CM

## 2018-08-23 DIAGNOSIS — M7752 Other enthesopathy of left foot: Secondary | ICD-10-CM

## 2018-08-23 DIAGNOSIS — M109 Gout, unspecified: Secondary | ICD-10-CM

## 2018-08-23 MED ORDER — TRIAMCINOLONE ACETONIDE 10 MG/ML IJ SUSP
10.0000 mg | Freq: Once | INTRAMUSCULAR | Status: AC
  Administered 2018-08-23: 10 mg

## 2018-08-23 NOTE — Progress Notes (Signed)
   Subjective:    Patient ID: Spencer English, male    DOB: 08-Jul-1966, 52 y.o.   MRN: [id number]  HPI    Review of Systems  All other systems reviewed and are negative.      Objective:   Physical Exam        Assessment & Plan:

## 2018-08-23 NOTE — Patient Instructions (Signed)

## 2018-08-24 ENCOUNTER — Telehealth: Payer: Self-pay | Admitting: *Deleted

## 2018-08-24 DIAGNOSIS — B351 Tinea unguium: Secondary | ICD-10-CM

## 2018-08-24 DIAGNOSIS — M109 Gout, unspecified: Secondary | ICD-10-CM

## 2018-08-24 DIAGNOSIS — M779 Enthesopathy, unspecified: Secondary | ICD-10-CM

## 2018-08-24 NOTE — Telephone Encounter (Signed)
Pt states Dr. Paulla Dolly had requested labs for gout, but he didn't get any orders.

## 2018-08-25 NOTE — Progress Notes (Signed)
Subjective:   Patient ID: Spencer English, male   DOB: 52 y.o.   MRN: [id number]   HPI Patient presents with discomfort in the left great toe more in the inner phalangeal joint and states he thinks it is gout and has had it now for about 5 days with no history of injury.  Patient is not currently smoking and likes to be active and is also concerned about nail disease 1-5 both feet   Review of Systems  All other systems reviewed and are negative.       Objective:  Physical Exam  Constitutional: He appears well-developed and well-nourished.  Cardiovascular: Intact distal pulses.  Pulmonary/Chest: Effort normal.  Musculoskeletal: Normal range of motion.  Neurological: He is alert.  Skin: Skin is warm.  Nursing note and vitals reviewed.   Neurovascular status intact muscle strength is adequate patient found to have inflammation of the inner phalangeal joint left big toe with fluid buildup noted that is painful when palpated and mild discomfort at the first MPJ left with thick yellow brittle nailbeds 1-5 both feet.  Has good digital perfusion well oriented x3     Assessment:  Inflammatory capsulitis with possibility for gout of the inner phalangeal joint left big toe with mycotic nail infection 1-5 both feet     Plan:  H&P x-ray reviewed and today I did inject the interphalangeal joint left big toe 3 mg Kenalog 5 mg Xylocaine advised on soaks and discussed gout educated patient on foods to be careful with.  Reviewed x-ray and reappoint 2 weeks to reevaluate  X-ray indicates no indications of arthritis or other bone pathology around the joint surface

## 2018-08-26 DIAGNOSIS — B351 Tinea unguium: Secondary | ICD-10-CM | POA: Diagnosis not present

## 2018-08-26 DIAGNOSIS — M109 Gout, unspecified: Secondary | ICD-10-CM | POA: Diagnosis not present

## 2018-08-26 DIAGNOSIS — M779 Enthesopathy, unspecified: Secondary | ICD-10-CM | POA: Diagnosis not present

## 2018-08-30 LAB — ANA: Anti Nuclear Antibody(ANA): NEGATIVE

## 2018-08-30 LAB — SEDIMENTATION RATE: Sed Rate: 6 mm/h (ref 0–20)

## 2018-08-30 LAB — CBC WITH DIFFERENTIAL/PLATELET
Basophils Absolute: 40 cells/uL (ref 0–200)
Basophils Relative: 1 %
Eosinophils Absolute: 180 cells/uL (ref 15–500)
Eosinophils Relative: 4.5 %
HEMATOCRIT: 45.9 % (ref 38.5–50.0)
Hemoglobin: 15.7 g/dL (ref 13.2–17.1)
LYMPHS ABS: 1236 {cells}/uL (ref 850–3900)
MCH: 27.1 pg (ref 27.0–33.0)
MCHC: 34.2 g/dL (ref 32.0–36.0)
MCV: 79.3 fL — AB (ref 80.0–100.0)
MPV: 10 fL (ref 7.5–12.5)
Monocytes Relative: 7.7 %
NEUTROS PCT: 55.9 %
Neutro Abs: 2236 cells/uL (ref 1500–7800)
Platelets: 231 10*3/uL (ref 140–400)
RBC: 5.79 10*6/uL (ref 4.20–5.80)
RDW: 15.3 % — AB (ref 11.0–15.0)
Total Lymphocyte: 30.9 %
WBC: 4 10*3/uL (ref 3.8–10.8)
WBCMIX: 308 {cells}/uL (ref 200–950)

## 2018-08-30 LAB — C-REACTIVE PROTEIN: CRP: 5.4 mg/L (ref <8.0)

## 2018-08-30 LAB — RHEUMATOID FACTOR: Rhuematoid fact SerPl-aCnc: <14 IU/mL (ref <14)

## 2018-08-30 LAB — URIC ACID: Uric Acid, Serum: 4.2 mg/dL (ref 4.0–8.0)

## 2018-09-01 ENCOUNTER — Telehealth: Payer: Self-pay | Admitting: *Deleted

## 2018-09-01 NOTE — Telephone Encounter (Signed)
Pt called for lab results. I informed pt the results were available and Dr. Paulla Dolly will did at his visit 09/06/2018. Pt states understanding.

## 2018-09-02 DIAGNOSIS — F411 Generalized anxiety disorder: Secondary | ICD-10-CM | POA: Diagnosis not present

## 2018-09-02 DIAGNOSIS — I10 Essential (primary) hypertension: Secondary | ICD-10-CM | POA: Diagnosis not present

## 2018-09-02 DIAGNOSIS — R0602 Shortness of breath: Secondary | ICD-10-CM | POA: Diagnosis not present

## 2018-09-03 ENCOUNTER — Ambulatory Visit: Payer: BLUE CROSS/BLUE SHIELD | Admitting: Endocrinology

## 2018-09-03 DIAGNOSIS — R634 Abnormal weight loss: Secondary | ICD-10-CM

## 2018-09-03 DIAGNOSIS — R140 Abdominal distension (gaseous): Secondary | ICD-10-CM

## 2018-09-03 MED ORDER — ISOSORB DINITRATE-HYDRALAZINE 20-37.5 MG PO TABS: 0.5000 | ORAL_TABLET | Freq: Two times a day (BID) | ORAL | 11 refills | Status: DC

## 2018-09-03 NOTE — Progress Notes (Signed)
Subjective:    Patient ID: Spencer English, male    DOB: 06/13/1966, 52 y.o.   MRN: [id number]  HPI Few mos of moderate bloating at the abdomen, in the context of eating, and assoc unintentional weight loss.   Past Medical History:  Diagnosis Date  . ALLERGIC RHINITIS 04/12/2009  . Atrial fibrillation ([place])   . BACK PAIN, LUMBAR 01/14/2011  . DEEP VENOUS THROMBOPHLEBITIS, LEG, LEFT 05/05/2009  . DM 08/08/2008  . DYSLIPIDEMIA 04/26/2009  . ECZEMA 05/23/2010  . 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 GERD (gastroesophageal reflux disease)   . GOUT 10/24/2010  . Hyperglycemia   . HYPERTENSION 07/21/2007  . 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 Leukopenia   . Long term (current) use of anticoagulants 04/03/2011  . PULMONARY EMBOLISM 05/03/2009  . UNSPECIFIED URINARY CALCULUS 07/04/2010    Past Surgical History:  Procedure Laterality Date  . ANTERIOR CRUCIATE LIGAMENT REPAIR  2000   Right  . CYSTECTOMY     back of head  . KNEE ARTHROSCOPY     left knee  . KNEE ARTHROSCOPY  01/03/2013   Procedure: ARTHROSCOPY KNEE;  Surgeon: Yvette Rack., MD;  Location: [facility];  Service: Orthopedics;  Laterality: Left;  Lavage Synovectomy, Removal of loose body  . KNEE ARTHROSCOPY W/ ACL RECONSTRUCTION     right knee    Social History   Socioeconomic History  . Marital status: Legally Separated    Spouse name: Not on file  . Number of children: 3  . Years of education: Not on file  . Highest education level: Not on file  Occupational History  . Occupation: Truck Education administrator: [employer]    Employer: [employer]  Social Needs  . Financial resource strain: Not on file  . Food insecurity:    Worry: Not on file    Inability: Not on file  . Transportation needs:    Medical: Not on file    Non-medical: Not on file  Tobacco Use  . Smoking status: Current Some Day Smoker    Types: Cigars    Last attempt to quit: 01/07/2005    Years since quitting: 13.6  . Smokeless tobacco:  Never Used  Substance and Sexual Activity  . Alcohol use: Yes    Comment: Beer everyother weekend   . Drug use: No  . Sexual activity: Not on file  Lifestyle  . Physical activity:    Days per week: Not on file    Minutes per session: Not on file  . Stress: Not on file  Relationships  . Social connections:    Talks on phone: Not on file    Gets together: Not on file    Attends religious service: Not on file    Active member of club or organization: Not on file    Attends meetings of clubs or organizations: Not on file    Relationship status: Not on file  . Intimate partner violence:    Fear of current or ex partner: Not on file    Emotionally abused: Not on file    Physically abused: Not on file    Forced sexual activity: Not on file  Other Topics Concern  . Not on file  Social History Narrative  . Not on file    Current Outpatient Medications on File Prior to Visit  Medication Sig Dispense Refill  . albuterol (PROVENTIL HFA;VENTOLIN HFA) 108 (90 Base) MCG/ACT inhaler Inhale 1-2 puffs into the lungs every 6 (six)  hours as needed for wheezing or shortness of breath. 1 Inhaler 0  . allopurinol (ZYLOPRIM) 300 MG tablet Take 1 tablet (300 mg total) by mouth at bedtime. 90 tablet 1  . atorvastatin (LIPITOR) 20 MG tablet Take 1 tablet (20 mg total) by mouth daily. 90 tablet 3  . busPIRone (BUSPAR) 15 MG tablet Take 1 tablet (15 mg total) by mouth 3 (three) times daily. 60 tablet 11  . cyclobenzaprine (FLEXERIL) 10 MG tablet Take 1 tablet (10 mg total) by mouth 2 (two) times daily as needed. 10 tablet 0  . fluconazole (DIFLUCAN) 150 MG tablet Take 1 tablet (150 mg total) by mouth once a week. 1 tablet 2  . fluticasone (FLONASE) 50 MCG/ACT nasal spray Place 2 sprays into both nostrils daily as needed. For allergies. 16 g 11  . furosemide (LASIX) 40 MG tablet TAKE 1 TABLET BY MOUTH ONCE DAILY 30 tablet 1  . halobetasol (ULTRAVATE) 0.05 % cream APPLY CREAM TOPICALLY THREE TIMES DAILY AS  NEEDED FOR RASH. 150 g 3  . losartan (COZAAR) 100 MG tablet Take 1 tablet (100 mg total) by mouth daily. 30 tablet 11  . methimazole (TAPAZOLE) 10 MG tablet TAKE 1 TABLET BY MOUTH ONCE DAILY 90 tablet 0  . metoprolol succinate (TOPROL-XL) 25 MG 24 hr tablet TAKE ONE-HALF TABLET(12.5MG  TOTAL) BY MOUTH DAILY 45 tablet 0  . Verapamil HCl CR 300 MG CP24 Take 1 capsule (300 mg total) by mouth daily. 30 each 11  . warfarin (COUMADIN) 5 MG tablet Take by mouth as directed by [facility] 50 tablet 0   No current facility-administered medications on file prior to visit.     Allergies  Allergen Reactions  . Metformin Diarrhea and Nausea Only  . Viagra [Sildenafil Citrate] Other (See Comments)    headache    Family History  Problem Relation Age of Onset  . Diabetes Mother   . Hypertension Mother   . Hyperlipidemia Mother   . Diabetes Father   . Hypertension Father   . Hyperlipidemia Father   . Heart attack Father   . Heart disease Father     BP 128/74 (BP Location: Left Arm)   Pulse 91   Ht 6' (1.829 m)   Wt 262 lb 3.2 oz (118.9 kg)   SpO2 97%   BMI 35.56 kg/m    Review of Systems Denies n/v.  Denies abd pain and BRBPR.  He has nasal congestion (he takes bidil 1 tab qd).      Objective:   Physical Exam VITAL SIGNS:  See vs page.   GENERAL: no distress.  ABDOMEN: abdomen is soft, nontender.  no hepatosplenomegaly.  not distended.  no hernia.     Lab Results  Component Value Date   HGBA1C 6.5 (A) 08/17/2018       Assessment & Plan:  Weight loss, uncertain etiology abd bloating, new, uncertain etiology Nasal congestion, poss due to bidil HTN: well-controlled.   Patient Instructions  Let's check a CT scan.  you will receive a phone call, about a day and time for an appointment. Please see a specialist.  you will receive a phone call, about a day and time for an appointment. Please stop taking the bidil. Please come back for a follow-up appointment in 1 month.

## 2018-09-03 NOTE — Patient Instructions (Signed)
Let's check a CT scan.  you will receive a phone call, about a day and time for an appointment. Please see a specialist.  you will receive a phone call, about a day and time for an appointment. Please stop taking the bidil. Please come back for a follow-up appointment in 1 month.

## 2018-09-06 ENCOUNTER — Ambulatory Visit: Payer: BLUE CROSS/BLUE SHIELD | Admitting: Podiatry

## 2018-09-06 ENCOUNTER — Encounter: Payer: Self-pay | Admitting: Podiatry

## 2018-09-06 DIAGNOSIS — M779 Enthesopathy, unspecified: Secondary | ICD-10-CM

## 2018-09-07 NOTE — Progress Notes (Signed)
Subjective:   Patient ID: Spencer English, male   DOB: 52 y.o.   MRN: [id number]   HPI Patient states the joint seems to be feeling quite a bit better but still is somewhat sore and he wanted to review blood work   ROS      Objective:  Physical Exam  Neurovascular status intact with patient inner phalangeal joint left big toe improved with diminished swelling pain but still discomfort upon deep palpation     Assessment:  Improvement of the inner phalangeal joint left hallux with possibility for systemic inflammatory disease     Plan:  Reviewed x-rays indicating no current indication of systemic disease and it appears to be soft tissue.  I reviewed this with Spencer English and explained this to him and he will be seen back as needed and may require further blood work if symptoms persist

## 2018-09-13 ENCOUNTER — Telehealth: Payer: Self-pay | Admitting: Endocrinology

## 2018-09-13 ENCOUNTER — Inpatient Hospital Stay: Admission: RE | Admit: 2018-09-13 | Payer: BLUE CROSS/BLUE SHIELD | Source: Ambulatory Visit

## 2018-09-13 ENCOUNTER — Ambulatory Visit: Payer: BLUE CROSS/BLUE SHIELD | Admitting: Pulmonary Disease

## 2018-09-13 DIAGNOSIS — J309 Allergic rhinitis, unspecified: Secondary | ICD-10-CM

## 2018-09-13 NOTE — Addendum Note (Signed)
Addended by: Renato Shin on: 09/13/2018 03:52 PM   Modules accepted: Orders

## 2018-09-13 NOTE — Telephone Encounter (Signed)
Pt is still suffering from a stuffy nose and breathing issues even once off the bidil please advise

## 2018-09-13 NOTE — Telephone Encounter (Signed)
Please advise 

## 2018-09-13 NOTE — Telephone Encounter (Signed)
done

## 2018-09-13 NOTE — Telephone Encounter (Signed)
Options: claritin-D and flonase, or: I would be happy to refer you to a specialist

## 2018-09-13 NOTE — Telephone Encounter (Signed)
Pt stated that he would like a referral placed

## 2018-09-15 ENCOUNTER — Ambulatory Visit: Payer: BLUE CROSS/BLUE SHIELD | Admitting: Endocrinology

## 2018-09-15 ENCOUNTER — Encounter: Payer: Self-pay | Admitting: Endocrinology

## 2018-09-15 ENCOUNTER — Ambulatory Visit: Payer: BLUE CROSS/BLUE SHIELD | Admitting: Pulmonary Disease

## 2018-09-15 VITALS — BP 144/100 | HR 78 | Ht 72.0 in | Wt 262.2 lb

## 2018-09-15 DIAGNOSIS — I10 Essential (primary) hypertension: Secondary | ICD-10-CM | POA: Diagnosis not present

## 2018-09-15 DIAGNOSIS — Z23 Encounter for immunization: Secondary | ICD-10-CM

## 2018-09-15 DIAGNOSIS — R0981 Nasal congestion: Secondary | ICD-10-CM | POA: Diagnosis not present

## 2018-09-15 MED ORDER — ISOSORB DINITRATE-HYDRALAZINE 20-37.5 MG PO TABS: 0.5000 | ORAL_TABLET | Freq: Two times a day (BID) | ORAL | 11 refills | Status: DC

## 2018-09-15 MED ORDER — CEFUROXIME AXETIL 500 MG PO TABS: 500.0000 mg | ORAL_TABLET | Freq: Two times a day (BID) | ORAL | 0 refills | Status: DC

## 2018-09-15 NOTE — Patient Instructions (Signed)
Please resume the "Bidil."  I have sent a prescription to your pharmacy, for an antibiotic pill.  Please see the allergy specialist, and do the CT, as scheduled.

## 2018-09-15 NOTE — Progress Notes (Signed)
Subjective:    Patient ID: Spencer English, male    DOB: [birth year], 52 y.o.   MRN: [id number]  HPI Few weeks of moderate congestion in the nose, and assoc rhinorrhea.  Past Medical History:  Diagnosis Date  . ALLERGIC RHINITIS 04/12/2009  . Atrial fibrillation ([place])   . BACK PAIN, LUMBAR 01/14/2011  . DEEP VENOUS THROMBOPHLEBITIS, LEG, LEFT 05/05/2009  . DM 08/08/2008  . DYSLIPIDEMIA 04/26/2009  . ECZEMA 05/23/2010  . 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 GERD (gastroesophageal reflux disease)   . GOUT 10/24/2010  . Hyperglycemia   . HYPERTENSION 07/21/2007  . 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 Leukopenia   . Long term (current) use of anticoagulants 04/03/2011  . PULMONARY EMBOLISM 05/03/2009  . UNSPECIFIED URINARY CALCULUS 07/04/2010    Past Surgical History:  Procedure Laterality Date  . ANTERIOR CRUCIATE LIGAMENT REPAIR  2000   Right  . CYSTECTOMY     back of head  . KNEE ARTHROSCOPY     left knee  . KNEE ARTHROSCOPY  01/03/2013   Procedure: ARTHROSCOPY KNEE;  Surgeon: Yvette Rack., MD;  Location: [facility];  Service: Orthopedics;  Laterality: Left;  Lavage Synovectomy, Removal of loose body  . KNEE ARTHROSCOPY W/ ACL RECONSTRUCTION     right knee    Social History   Socioeconomic History  . Marital status: Legally Separated    Spouse name: Not on file  . Number of children: 3  . Years of education: Not on file  . Highest education level: Not on file  Occupational History  . 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 Financial resource strain: Not on file  . Food insecurity:    Worry: Not on file    Inability: Not on file  . Transportation needs:    Medical: Not on file    Non-medical: Not on file  Tobacco Use  . Smoking status: Current Some Day Smoker    Types: Cigars    Last attempt to quit: 01/07/2005    Years since quitting: 13.7  . Smokeless tobacco: Never Used  Substance and Sexual Activity    . Alcohol use: Yes    Comment: Beer everyother weekend   . Drug use: No  . Sexual activity: Not on file  Lifestyle  . Physical activity:    Days per week: Not on file    Minutes per session: Not on file  . Stress: Not on file  Relationships  . Social connections:    Talks on phone: Not on file    Gets together: Not on file    Attends religious service: Not on file    Active member of club or organization: Not on file    Attends meetings of clubs or organizations: Not on file    Relationship status: Not on file  . Intimate partner violence:    Fear of current or ex partner: Not on file    Emotionally abused: Not on file    Physically abused: Not on file    Forced sexual activity: Not on file  Other Topics Concern  . Not on file  Social History Narrative  . Not on file    Current Outpatient Medications on File Prior to Visit  Medication Sig Dispense Refill  . albuterol (PROVENTIL HFA;VENTOLIN HFA) 108 (90 Base) MCG/ACT inhaler Inhale 1-2 puffs into the lungs every 6 (six) hours as needed for wheezing or shortness  of breath. 1 Inhaler 0  . allopurinol (ZYLOPRIM) 300 MG tablet Take 1 tablet (300 mg total) by mouth at bedtime. 90 tablet 1  . atorvastatin (LIPITOR) 20 MG tablet Take 1 tablet (20 mg total) by mouth daily. 90 tablet 3  . busPIRone (BUSPAR) 15 MG tablet Take 1 tablet (15 mg total) by mouth 3 (three) times daily. 60 tablet 11  . cyclobenzaprine (FLEXERIL) 10 MG tablet Take 1 tablet (10 mg total) by mouth 2 (two) times daily as needed. 10 tablet 0  . fluconazole (DIFLUCAN) 150 MG tablet Take 1 tablet (150 mg total) by mouth once a week. 1 tablet 2  . fluticasone (FLONASE) 50 MCG/ACT nasal spray Place 2 sprays into both nostrils daily as needed. For allergies. 16 g 11  . furosemide (LASIX) 40 MG tablet TAKE 1 TABLET BY MOUTH ONCE DAILY 30 tablet 1  . halobetasol (ULTRAVATE) 0.05 % cream APPLY CREAM TOPICALLY THREE TIMES DAILY AS NEEDED FOR RASH. 150 g 3  . losartan  (COZAAR) 100 MG tablet Take 1 tablet (100 mg total) by mouth daily. 30 tablet 11  . methimazole (TAPAZOLE) 10 MG tablet TAKE 1 TABLET BY MOUTH ONCE DAILY 90 tablet 0  . metoprolol succinate (TOPROL-XL) 25 MG 24 hr tablet TAKE ONE-HALF TABLET(12.5MG  TOTAL) BY MOUTH DAILY 45 tablet 0  . Verapamil HCl CR 300 MG CP24 Take 1 capsule (300 mg total) by mouth daily. 30 each 11  . warfarin (COUMADIN) 5 MG tablet Take by mouth as directed by [facility] 50 tablet 0   No current facility-administered medications on file prior to visit.     Allergies  Allergen Reactions  . Metformin Diarrhea and Nausea Only  . Viagra [Sildenafil Citrate] Other (See Comments)    headache    Family History  Problem Relation Age of Onset  . Diabetes Mother   . Hypertension Mother   . Hyperlipidemia Mother   . Diabetes Father   . Hypertension Father   . Hyperlipidemia Father   . Heart attack Father   . Heart disease Father     BP (!) 144/100   Pulse 78   Ht 6' (1.829 m)   Wt 262 lb 3.2 oz (118.9 kg)   SpO2 97%   BMI 35.56 kg/m    Review of Systems He denies earache and fever.  abd bloating is only slightly improved.     Objective:   Physical Exam VITAL SIGNS:  See vs page.  GENERAL: no distress head: no deformity  eyes: no periorbital swelling, no proptosis  external nose and ears are normal  mouth: no lesion seen Both eac's and tm's are normal.      Assessment & Plan:  HTN: worse Nasal congestion: persistent.   URI vs allergic rhinitis, persistent.    Patient Instructions  Please resume the "Bidil."  I have sent a prescription to your pharmacy, for an antibiotic pill.  Please see the allergy specialist, and do the CT, as scheduled.

## 2018-09-15 NOTE — Telephone Encounter (Signed)
error 

## 2018-09-18 ENCOUNTER — Emergency Department (HOSPITAL_COMMUNITY): Payer: BLUE CROSS/BLUE SHIELD

## 2018-09-18 ENCOUNTER — Emergency Department (HOSPITAL_COMMUNITY)
Admission: EM | Admit: 2018-09-18 | Discharge: 2018-09-18 | Disposition: A | Discharge status: Home / Self Care | Payer: BLUE CROSS/BLUE SHIELD | Attending: Emergency Medicine | Admitting: Emergency Medicine

## 2018-09-18 ENCOUNTER — Encounter (HOSPITAL_COMMUNITY): Payer: Self-pay | Admitting: *Deleted

## 2018-09-18 DIAGNOSIS — Z7901 Long term (current) use of anticoagulants: Secondary | ICD-10-CM | POA: Diagnosis not present

## 2018-09-18 DIAGNOSIS — K5730 Diverticulosis of large intestine without perforation or abscess without bleeding: Secondary | ICD-10-CM | POA: Diagnosis not present

## 2018-09-18 DIAGNOSIS — J329 Chronic sinusitis, unspecified: Secondary | ICD-10-CM | POA: Diagnosis not present

## 2018-09-18 DIAGNOSIS — R634 Abnormal weight loss: Secondary | ICD-10-CM | POA: Insufficient documentation

## 2018-09-18 DIAGNOSIS — E119 Type 2 diabetes mellitus without complications: Secondary | ICD-10-CM | POA: Diagnosis not present

## 2018-09-18 DIAGNOSIS — I10 Essential (primary) hypertension: Secondary | ICD-10-CM | POA: Insufficient documentation

## 2018-09-18 DIAGNOSIS — F17290 Nicotine dependence, other tobacco product, uncomplicated: Secondary | ICD-10-CM | POA: Insufficient documentation

## 2018-09-18 DIAGNOSIS — R05 Cough: Secondary | ICD-10-CM | POA: Diagnosis not present

## 2018-09-18 DIAGNOSIS — Z79899 Other long term (current) drug therapy: Secondary | ICD-10-CM | POA: Insufficient documentation

## 2018-09-18 DIAGNOSIS — N200 Calculus of kidney: Secondary | ICD-10-CM | POA: Diagnosis not present

## 2018-09-18 DIAGNOSIS — R109 Unspecified abdominal pain: Secondary | ICD-10-CM | POA: Diagnosis not present

## 2018-09-18 DIAGNOSIS — R0602 Shortness of breath: Secondary | ICD-10-CM | POA: Diagnosis not present

## 2018-09-18 LAB — CBC WITH DIFFERENTIAL/PLATELET
BASOS ABS: 0 10*3/uL (ref 0.0–0.1)
BASOS PCT: 1 %
EOS ABS: 0.3 10*3/uL (ref 0.0–0.7)
Eosinophils Relative: 6 %
HCT: 45.6 % (ref 39.0–52.0)
HEMOGLOBIN: 14.9 g/dL (ref 13.0–17.0)
Lymphocytes Relative: 26 %
Lymphs Abs: 1.2 10*3/uL (ref 0.7–4.0)
MCH: 27.7 pg (ref 26.0–34.0)
MCHC: 32.7 g/dL (ref 30.0–36.0)
MCV: 84.8 fL (ref 78.0–100.0)
MONOS PCT: 11 %
Monocytes Absolute: 0.5 10*3/uL (ref 0.1–1.0)
NEUTROS ABS: 2.5 10*3/uL (ref 1.7–7.7)
Neutrophils Relative %: 56 %
Platelets: 199 10*3/uL (ref 150–400)
RBC: 5.38 MIL/uL (ref 4.22–5.81)
RDW: 16.7 % — ABNORMAL HIGH (ref 11.5–15.5)
WBC: 4.4 10*3/uL (ref 4.0–10.5)

## 2018-09-18 LAB — COMPREHENSIVE METABOLIC PANEL
ALBUMIN: 4 g/dL (ref 3.5–5.0)
ALK PHOS: 69 U/L (ref 38–126)
ALT: 18 U/L (ref 0–44)
ANION GAP: 8 (ref 5–15)
AST: 18 U/L (ref 15–41)
BILIRUBIN TOTAL: 1.7 mg/dL — AB (ref 0.3–1.2)
BUN: 8 mg/dL (ref 6–20)
CALCIUM: 9.2 mg/dL (ref 8.9–10.3)
CO2: 28 mmol/L (ref 22–32)
Chloride: 110 mmol/L (ref 98–111)
Creatinine, Ser: 1.24 mg/dL (ref 0.61–1.24)
GFR calc Af Amer: >60 mL/min (ref >60)
GLUCOSE: 133 mg/dL — AB (ref 70–99)
POTASSIUM: 4.5 mmol/L (ref 3.5–5.1)
Sodium: 146 mmol/L — ABNORMAL HIGH (ref 135–145)
TOTAL PROTEIN: 7.5 g/dL (ref 6.5–8.1)

## 2018-09-18 LAB — LIPASE, BLOOD: LIPASE: 33 U/L (ref 11–51)

## 2018-09-18 MED ORDER — IOPAMIDOL (ISOVUE-300) INJECTION 61%
100.0000 mL | Freq: Once | INTRAVENOUS | Status: AC | PRN
  Administered 2018-09-18: 100 mL via INTRAVENOUS

## 2018-09-18 MED ORDER — IOPAMIDOL (ISOVUE-300) INJECTION 61%
INTRAVENOUS | Status: DC
Start: 2018-09-18 — End: 2018-09-18
  Filled 2018-09-18: qty 100

## 2018-09-18 MED ORDER — SODIUM CHLORIDE 0.9 % IV BOLUS
500.0000 mL | Freq: Once | INTRAVENOUS | Status: AC
  Administered 2018-09-18: 500 mL via INTRAVENOUS

## 2018-09-18 MED ORDER — DIPHENHYDRAMINE HCL 25 MG PO TABS: 25.0000 mg | ORAL_TABLET | Freq: Four times a day (QID) | ORAL | 0 refills | Status: DC | PRN

## 2018-09-18 NOTE — ED Triage Notes (Signed)
Pt complains of abdominal pain and difficulty taking deep breaths. Pt was recently seen by PCP, was put on antibiotics but states he does not feel better. Pt states he had similar symptoms when he was diagnosed with pulmonary embolism.

## 2018-09-18 NOTE — ED Notes (Signed)
Patient transported to CT 

## 2018-09-18 NOTE — ED Provider Notes (Signed)
[facility] DEPT Provider Note   CSN: [id number] Arrival date & time: 09/18/18  1039     History   Chief Complaint Chief Complaint  Patient presents with  . Abdominal Pain    HPI Burk Hoctor is a 52 y.o. male.  HPI Patient presents with nasal congestion abdominal pain and weight loss.  States that nasal congestion is been going there for months.  States started in July.  Started while he was at [employer].  States it began suddenly.  Has been on antibiotics and steroids without relief.  Also at times it at toe pain. Has cough at times.  No real production. Patient also states he is lost around 30 pounds over the last few months.  States that he has sort of dull abdominal pain that is worse in the upper abdomen.  Was scheduled for a CT scan but was unable to get it done and has been rescheduled.  No blood in stool.  No black stools. Past Medical History:  Diagnosis Date  . ALLERGIC RHINITIS 04/12/2009  . Atrial fibrillation ([place])   . BACK PAIN, LUMBAR 01/14/2011  . DEEP VENOUS THROMBOPHLEBITIS, LEG, LEFT 05/05/2009  . DM 08/08/2008  . DYSLIPIDEMIA 04/26/2009  . ECZEMA 05/23/2010  . 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 GERD (gastroesophageal reflux disease)   . GOUT 10/24/2010  . Hyperglycemia   . HYPERTENSION 07/21/2007  . 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 Leukopenia   . Long term (current) use of anticoagulants 04/03/2011  . PULMONARY EMBOLISM 05/03/2009  . UNSPECIFIED URINARY CALCULUS 07/04/2010    Patient Active Problem List   Diagnosis Date Noted  . Bloating 09/03/2018  . Constipation 08/17/2018  . OSA on CPAP 06/04/2017  . Obese 06/04/2017  . Wellness examination 09/05/2015  . Screening for cancer 05/15/2015  . Sleep disorder 06/27/2014  . Moderate mitral regurgitation 06/26/2014  . Screening for prostate cancer 10/10/2013  . Pulmonary edema 02/01/2013  . Hyperthyroidism 01/27/2013  . Moderate to severe pulmonary hypertension ([place])  01/26/2013  . Syncope 01/25/2013  . Microcytic anemia 01/25/2013  . Thrombocytopenia ([place]) 01/25/2013  . Hypotension due to drugs 01/23/2013  . History of septic arthritis 01/23/2013  . Atrial fibrillation ([place])   . Palpitations 11/03/2012  . Encounter for long-term (current) use of other medications 09/06/2012  . Pre-employment examination 10/11/2011  . Screening examination for infectious disease 06/16/2011  . Encounter for long-term (current) use of anticoagulants 04/03/2011  . WHEEZING 01/31/2011  . BACK PAIN, LUMBAR 01/14/2011  . Gout 10/24/2010  . UNSPECIFIED URINARY CALCULUS 07/04/2010  . ECZEMA 05/23/2010  . EDEMA 10/25/2009  . 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 DVT (deep venous thrombosis) ([place]) 05/05/2009  . FOOT PAIN, LEFT 05/05/2009  . 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 DYSLIPIDEMIA 04/26/2009  . Allergic rhinitis 04/12/2009  . Pain in joint, ankle and foot 04/12/2009  . Diabetes ([place]) 08/08/2008  . 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 SEBACEOUS CYST 01/31/2008  . Essential hypertension 07/21/2007    Past Surgical History:  Procedure Laterality Date  . ANTERIOR CRUCIATE LIGAMENT REPAIR  2000   Right  . CYSTECTOMY     back of head  . KNEE ARTHROSCOPY     left knee  . KNEE ARTHROSCOPY  01/03/2013   Procedure: ARTHROSCOPY KNEE;  Surgeon: Yvette Rack., MD;  Location: [facility];  Service: Orthopedics;  Laterality: Left;  Lavage Synovectomy, Removal of loose body  . KNEE ARTHROSCOPY W/ ACL RECONSTRUCTION     right knee  Home Medications    Prior to Admission medications   Medication Sig Start Date End Date Taking? Authorizing Provider  albuterol (PROVENTIL HFA;VENTOLIN HFA) 108 (90 Base) MCG/ACT inhaler Inhale 1-2 puffs into the lungs every 6 (six) hours as needed for wheezing or shortness of breath. 07/30/18  Yes Varney Biles, MD  allopurinol (ZYLOPRIM) 300 MG tablet Take 1 tablet (300 mg total) by mouth at bedtime. 05/26/17  Yes Renato Shin, MD  atorvastatin (LIPITOR) 20 MG tablet Take 1 tablet (20 mg total) by mouth daily. 03/26/18  Yes Renato Shin, MD  cefUROXime (CEFTIN) 500 MG tablet Take 1 tablet (500 mg total) by mouth 2 (two) times daily with a meal. 09/15/18  Yes Renato Shin, MD  furosemide (LASIX) 40 MG tablet TAKE 1 TABLET BY MOUTH ONCE DAILY 01/22/18  Yes Renato Shin, MD  halobetasol (ULTRAVATE) 0.05 % cream APPLY CREAM TOPICALLY THREE TIMES DAILY AS NEEDED FOR RASH. 10/07/17  Yes Renato Shin, MD  isosorbide-hydrALAZINE (BIDIL) 20-37.5 MG tablet Take 0.5 tablets by mouth 2 (two) times daily. 09/15/18  Yes Renato Shin, MD  losartan (COZAAR) 100 MG tablet Take 1 tablet (100 mg total) by mouth daily. 12/10/17  Yes Renato Shin, MD  methimazole (TAPAZOLE) 10 MG tablet TAKE 1 TABLET BY MOUTH ONCE DAILY 07/14/18  Yes Renato Shin, MD  metoprolol succinate (TOPROL-XL) 25 MG 24 hr tablet TAKE ONE-HALF TABLET(12.5MG  TOTAL) BY MOUTH DAILY Patient taking differently: Take 12.5 mg by mouth daily.  06/02/18  Yes Renato Shin, MD  Verapamil HCl CR 300 MG CP24 Take 1 capsule (300 mg total) by mouth daily. 10/07/17  Yes Renato Shin, MD  warfarin (COUMADIN) 5 MG tablet Take by mouth as directed by [facility] 08/07/18  Yes Dunn, Dayna N, PA-C  busPIRone (BUSPAR) 15 MG tablet Take 1 tablet (15 mg total) by mouth 3 (three) times daily. Patient not taking: Reported on 09/18/2018 03/25/18   Renato Shin, MD  cyclobenzaprine (FLEXERIL) 10 MG tablet Take 1 tablet (10 mg total) by mouth 2 (two) times daily as needed. 03/09/18   Doristine Devoid, PA-C  diphenhydrAMINE (BENADRYL) 25 MG tablet Take 1 tablet (25 mg total) by mouth every 6 (six) hours as needed for allergies. 09/18/18   Davonna Belling, MD  fluconazole (DIFLUCAN) 150 MG tablet Take 1 tablet (150 mg total) by mouth once a week. Patient not taking: Reported on 09/18/2018 07/14/18   Renato Shin, MD  fluticasone [facility]) 50 MCG/ACT nasal spray Place 2 sprays into both  nostrils daily as needed. For allergies. Patient not taking: Reported on 09/18/2018 05/20/17   Renato Shin, MD    Family History Family History  Problem Relation Age of Onset  . Diabetes Mother   . Hypertension Mother   . Hyperlipidemia Mother   . Diabetes Father   . Hypertension Father   . Hyperlipidemia Father   . Heart attack Father   . Heart disease Father     Social History Social History   Tobacco Use  . Smoking status: Current Some Day Smoker    Types: Cigars    Last attempt to quit: 01/07/2005    Years since quitting: 13.7  . Smokeless tobacco: Never Used  Substance Use Topics  . Alcohol use: Yes    Comment: Beer everyother weekend   . Drug use: No     Allergies   Metformin and Viagra [sildenafil citrate]   Review of Systems Review of Systems  Constitutional: Positive for appetite change.  HENT: Positive for congestion.  Negative for sinus pain and trouble swallowing.   Respiratory: Positive for shortness of breath.   Gastrointestinal: Positive for abdominal pain. Negative for nausea and vomiting.  Genitourinary: Negative for dysuria.  Musculoskeletal: Negative for back pain.  Neurological: Negative for weakness.  Psychiatric/Behavioral: Negative for confusion.     Physical Exam Updated Vital Signs BP (!) 154/96   Pulse (!) 57   Temp 97.8 F (36.6 C) (Oral)   Resp 16   SpO2 100%   Physical Exam  Constitutional: He appears well-developed.  HENT:  Head: Atraumatic.  Mouth/Throat: No oropharyngeal exudate.  No sinus tenderness  Eyes: EOM are normal.  Cardiovascular: Normal rate.  Pulmonary/Chest: Breath sounds normal.  Abdominal: Normal appearance.  No abdominal mass or clear tenderness.  Neurological: He is alert.  Skin: Skin is warm. Capillary refill takes less than 2 seconds.     ED Treatments / Results  Labs (all labs ordered are listed, but only abnormal results are displayed) Labs Reviewed  COMPREHENSIVE METABOLIC PANEL -  Abnormal; Notable for the following components:      Result Value   Sodium 146 (*)    Glucose, Bld 133 (*)    Total Bilirubin 1.7 (*)    All other components within normal limits  CBC WITH DIFFERENTIAL/PLATELET - Abnormal; Notable for the following components:   RDW 16.7 (*)    All other components within normal limits  LIPASE, BLOOD    EKG None  Radiology Dg Chest 2 View  Result Date: 09/18/2018 CLINICAL DATA:  Shortness of breath, cough, diabetes and hypertension EXAM: CHEST - 2 VIEW COMPARISON:  07/29/2018 FINDINGS: The heart size and mediastinal contours are within normal limits. Both lungs are clear. The visualized skeletal structures are unremarkable. Trachea is midline. Normal heart size and vascularity. Stable spondylosis of the spine. IMPRESSION: No active cardiopulmonary disease. Electronically Signed   By: Jerilynn Mages.  Shick M.D.   On: 09/18/2018 12:08   Ct Abdomen Pelvis W Contrast  Result Date: 09/18/2018 CLINICAL DATA:  Abdominal pain.  Unintended weight loss. EXAM: CT ABDOMEN AND PELVIS WITH CONTRAST TECHNIQUE: Multidetector CT imaging of the abdomen and pelvis was performed using the standard protocol following bolus administration of intravenous contrast. CONTRAST:  189mL ISOVUE-300 IOPAMIDOL (ISOVUE-300) INJECTION 61% COMPARISON:  CT scan dated 07/11/2010 FINDINGS: Lower chest: Normal. Hepatobiliary: No focal liver abnormality is seen. No gallstones, gallbladder wall thickening, or biliary dilatation. Pancreas: Unremarkable. No pancreatic ductal dilatation or surrounding inflammatory changes. Spleen: Normal in size without focal abnormality. Adrenals/Urinary Tract: Chronic 9 mm stone in left renal pelvis, unchanged. Kidneys, adrenal glands, and bladder are otherwise normal. Stomach/Bowel: Scattered diverticula in the colon. No diverticulitis. Cecum lies in the midline. Normal appendix and terminal ileum. Stomach and small bowel are normal. Vascular/Lymphatic: Aortic atherosclerosis. No  enlarged abdominal or pelvic lymph nodes. Reproductive: Prostate is unremarkable. Other: Umbilical hernia containing only fat. Defect is approximately 3.3 cm. No ascites. Musculoskeletal: No acute abnormality. Severe right facet arthritis at L5-S1. IMPRESSION: 1. No acute abnormalities of the abdomen pelvis. 2. Scattered diverticula in the colon. 3. Stable 9 mm stone in the left renal pelvis. 4. Slight aortic atherosclerosis. Electronically Signed   By: Lorriane Shire M.D.   On: 09/18/2018 12:57    Procedures Procedures (including critical care time)  Medications Ordered in ED Medications  iopamidol (ISOVUE-300) 61 % injection (has no administration in time range)  sodium chloride 0.9 % bolus 500 mL (0 mLs Intravenous Stopped 09/18/18 1249)  iopamidol (ISOVUE-300) 61 % injection 100  mL (100 mLs Intravenous Contrast Given 09/18/18 1225)     Initial Impression / Assessment and Plan / ED Course  I have reviewed the triage vital signs and the nursing notes.  Pertinent labs & imaging results that were available during my care of the patient were reviewed by me and considered in my medical decision making (see chart for details).     Patient with abdominal pain.  Also sinus congestion.  Had been on antibiotics with only 3 days in.  On anticoagulation already for previous PVCs although reviewing records has had several negative CTAs.  Doubt pulmonary embolism.  Has abdominal tenderness and weight loss.  CT scan had been ordered as an outpatient was done here and reassuring.  Doubt PE.  No clear pathology.  Discharge home.  Final Clinical Impressions(s) / ED Diagnoses   Final diagnoses:  Sinusitis, unspecified chronicity, unspecified location    ED Discharge Orders         Ordered    diphenhydrAMINE (BENADRYL) 25 MG tablet  Every 6 hours PRN     09/18/18 1421           Davonna Belling, MD 09/18/18 1614

## 2018-09-20 ENCOUNTER — Telehealth: Payer: Self-pay | Admitting: Physician Assistant

## 2018-09-20 NOTE — Telephone Encounter (Signed)
° °  Pt c/o Shortness Of Breath: STAT if SOB developed within the last 24 hours or pt is noticeably SOB on the phone  1. Are you currently SOB (can you hear that pt is SOB on the phone)? No  2. How long have you been experiencing SOB? Since July   3. Are you SOB when sitting or when up moving around? Sitting and moving  4. Are you currently experiencing any other symptoms? Nasal congestion.  Patient requesting order for echo.

## 2018-09-20 NOTE — Telephone Encounter (Signed)
Please schedule with general cardiology APP at next available.

## 2018-09-20 NOTE — Telephone Encounter (Signed)
Patient complaining of having SOB since July. Patient stated he has has SOB that comes and goes since the end of July with nasal congestion. Patient has been in the ED several times in the last few months. Patient has seen his PCP Dr. Loanne Drilling, who has referred him to ENT doctor. Patient has an appointment tomorrow with ENT doctor. Encouraged patient to keep his appointment with ENT. Patient wanted to see his cardiologist ASAP. Informed patient that he could be scheduled with next available PA/NP to be evaluated and see if he needs an echo. Patient stated he thinks he has heart failure. Made patient an appointment with Truitt Merle NP on 10/06/18. Will send message to Dr. Rayann Heman for further advisement.

## 2018-09-21 ENCOUNTER — Telehealth: Payer: Self-pay | Admitting: Physician Assistant

## 2018-09-21 ENCOUNTER — Other Ambulatory Visit: Payer: Self-pay | Admitting: Physician Assistant

## 2018-09-21 ENCOUNTER — Other Ambulatory Visit: Payer: Self-pay | Admitting: Endocrinology

## 2018-09-21 DIAGNOSIS — J342 Deviated nasal septum: Secondary | ICD-10-CM | POA: Diagnosis not present

## 2018-09-21 DIAGNOSIS — J310 Chronic rhinitis: Secondary | ICD-10-CM | POA: Diagnosis not present

## 2018-09-21 DIAGNOSIS — J343 Hypertrophy of nasal turbinates: Secondary | ICD-10-CM | POA: Diagnosis not present

## 2018-09-21 MED ORDER — WARFARIN SODIUM 5 MG PO TABS: ORAL_TABLET | ORAL | 0 refills | Status: DC

## 2018-09-21 NOTE — Telephone Encounter (Signed)
Patient calls because he had run out of Coumadin. He states he has known left. He has an appointment coming up 10/9 for shortness of breath.  He states he thinks he missed a Coumadin appointment, but is not sure when it was.  He states he does not have any other problems or issues at this time.  Renew the Coumadin for 50 tablets, no refills.  I will route this to [place], [facility] to get him a Coumadin clinic appointment.  Rosaria Ferries, PA-C 09/21/2018 9:39 PM [facility] [phone]

## 2018-09-21 NOTE — Telephone Encounter (Signed)
Appt already made with [facility].  No further action needed at this time.

## 2018-09-22 ENCOUNTER — Telehealth: Payer: Self-pay | Admitting: Endocrinology

## 2018-09-22 MED ORDER — WARFARIN SODIUM 5 MG PO TABS: ORAL_TABLET | ORAL | 0 refills | Status: DC

## 2018-09-22 NOTE — Telephone Encounter (Signed)
Pt has been noncompliant with his warfarin checks. We called the pharmacy and cancelled the prescription for warfarin as he has not yet picked up.   We called the patient and spoke with him. He states he knows he has not been 'doing right.' We explained need for appt to refill as he has been noncompliant. He states he knew he needed an appt, but just went back to work a few days ago and does not want to take additional time off. He is unsure why he did not make an appt while out of work other than that he was not feeling well. He agreed to make appt for today to have INR checked.   We discussed the change to Xarelto. He states that he wants to continue the medication he has been on, but that he had discussed with Dr. Rayann Heman before. He does admit that Dr. Rayann Heman did advise a change, but he has been reluctant. We discussed that this would dramatically reduce he his need for follow up visits as he would not need to be seen for INR checks. We also discussed that this medication is effective if taken correctly and once daily like warfarin. He still seems reluctant to change, but will discuss again at INR check this afternoon with plan to change to Xarelto.

## 2018-09-22 NOTE — Telephone Encounter (Signed)
Spencer English called [facility] stating "Needs refill of Furosemide 40 mg. Has not had med in 2 days - legs more swollen due to work." Ph# [phone]

## 2018-09-22 NOTE — Addendum Note (Signed)
Addended by: Erskine Emery on: 09/22/2018 03:42 PM   Modules accepted: Orders

## 2018-09-22 NOTE — Telephone Encounter (Signed)
Pt called back to cancel appt for INR check as he can not get out of work today. He is unable to reschedule appt for this week. He states he will not be able to come until Thursday next week. Since refills cancelled will send refill for just enough tablets to get through until visit. Pt is aware.  Again discussed need to change to Xarelto. He states understanding, but is still extremely reluctant to change.

## 2018-09-23 NOTE — Telephone Encounter (Signed)
Medication has been sent.  

## 2018-09-27 ENCOUNTER — Telehealth: Payer: Self-pay

## 2018-09-27 ENCOUNTER — Inpatient Hospital Stay: Admission: RE | Admit: 2018-09-27 | Payer: BLUE CROSS/BLUE SHIELD | Source: Ambulatory Visit

## 2018-09-27 ENCOUNTER — Other Ambulatory Visit: Payer: BLUE CROSS/BLUE SHIELD

## 2018-09-27 NOTE — Telephone Encounter (Signed)
GI called today and stated the patient did not show up for CT today just an [facility]

## 2018-09-29 NOTE — Telephone Encounter (Signed)
error 

## 2018-09-29 NOTE — Telephone Encounter (Signed)
Called pharmacy because on 09/23/18 we sent the prescription number 30 no refills. They state they did not get that RX. Gave verbal over the phone and advised them to notify pt when it is ready.

## 2018-09-29 NOTE — Telephone Encounter (Signed)
Pt stated he went to pick up his furosemide and he was only given 1 pill. Pt stated the pharmacy stated that was all that was on the prescription. Pt want a call back from nurse.

## 2018-10-01 ENCOUNTER — Ambulatory Visit: Payer: BLUE CROSS/BLUE SHIELD | Admitting: Pulmonary Disease

## 2018-10-04 ENCOUNTER — Ambulatory Visit: Payer: BLUE CROSS/BLUE SHIELD | Admitting: Endocrinology

## 2018-10-04 DIAGNOSIS — Z0289 Encounter for other administrative examinations: Secondary | ICD-10-CM

## 2018-10-06 ENCOUNTER — Ambulatory Visit (INDEPENDENT_AMBULATORY_CARE_PROVIDER_SITE_OTHER)
Admission: RE | Admit: 2018-10-06 | Discharge: 2018-10-06 | Disposition: A | Discharge status: Home / Self Care | Payer: BLUE CROSS/BLUE SHIELD | Source: Ambulatory Visit | Attending: Nurse Practitioner | Admitting: Nurse Practitioner

## 2018-10-06 ENCOUNTER — Ambulatory Visit (INDEPENDENT_AMBULATORY_CARE_PROVIDER_SITE_OTHER): Payer: BLUE CROSS/BLUE SHIELD | Admitting: Pharmacist

## 2018-10-06 ENCOUNTER — Ambulatory Visit (INDEPENDENT_AMBULATORY_CARE_PROVIDER_SITE_OTHER): Payer: BLUE CROSS/BLUE SHIELD | Admitting: Nurse Practitioner

## 2018-10-06 ENCOUNTER — Encounter: Payer: Self-pay | Admitting: Nurse Practitioner

## 2018-10-06 VITALS — BP 120/80 | HR 95 | Ht 72.0 in | Wt 273.8 lb

## 2018-10-06 DIAGNOSIS — Z7901 Long term (current) use of anticoagulants: Secondary | ICD-10-CM

## 2018-10-06 DIAGNOSIS — I2699 Other pulmonary embolism without acute cor pulmonale: Secondary | ICD-10-CM

## 2018-10-06 DIAGNOSIS — I480 Paroxysmal atrial fibrillation: Secondary | ICD-10-CM

## 2018-10-06 DIAGNOSIS — I10 Essential (primary) hypertension: Secondary | ICD-10-CM

## 2018-10-06 DIAGNOSIS — R0602 Shortness of breath: Secondary | ICD-10-CM | POA: Diagnosis not present

## 2018-10-06 DIAGNOSIS — E7849 Other hyperlipidemia: Secondary | ICD-10-CM | POA: Diagnosis not present

## 2018-10-06 DIAGNOSIS — R0600 Dyspnea, unspecified: Secondary | ICD-10-CM

## 2018-10-06 DIAGNOSIS — I82402 Acute embolism and thrombosis of unspecified deep veins of left lower extremity: Secondary | ICD-10-CM | POA: Diagnosis not present

## 2018-10-06 DIAGNOSIS — I2782 Chronic pulmonary embolism: Secondary | ICD-10-CM

## 2018-10-06 LAB — POCT INR: INR: 1.9 — AB (ref 2.0–3.0)

## 2018-10-06 MED ORDER — RIVAROXABAN 20 MG PO TABS: 20.0000 mg | ORAL_TABLET | Freq: Every day | ORAL | 0 refills | Status: DC

## 2018-10-06 MED ORDER — IOPAMIDOL (ISOVUE-370) INJECTION 76%
80.0000 mL | Freq: Once | INTRAVENOUS | Status: AC | PRN
  Administered 2018-10-06: 80 mL via INTRAVENOUS

## 2018-10-06 NOTE — Patient Instructions (Signed)
Description   Stop warfarin. Start Xarelto 20mg  once daily tonight. Take Xarelto with a meal.

## 2018-10-06 NOTE — Patient Instructions (Addendum)
We will be checking the following labs today - BMET and BNP   Medication Instructions:    Continue with your current medicines. BUT  We are going to change you to Xarelto from coumadin   If you need a refill on your cardiac medications before your next appointment, please call your pharmacy.     Testing/Procedures To Be Arranged:  CT of chest to rule out PE  Echocardiogram  Follow-Up:   See Joseph Art as planned next month.     At [facility], you and your health needs are our priority.  As part of our continuing mission to provide you with exceptional heart care, we have created designated Provider Care Teams.  These Care Teams include your primary Cardiologist (physician) and Advanced Practice Providers (APPs -  Physician Assistants and Nurse Practitioners) who all work together to provide you with the care you need, when you need it.  Special Instructions:  . None  Call the [place] office at [phone] if you have any questions, problems or concerns.

## 2018-10-06 NOTE — Progress Notes (Signed)
CARDIOLOGY OFFICE NOTE  Date:  10/06/2018    Spencer English Date of Birth: Jan 26, 1966 Medical Record #[id number]  PCP:  Renato Shin, MD  Cardiologist:  [facility]  Chief Complaint  Patient presents with  . Shortness of Breath    Work in visit - seen for Dr. Rayann Heman    History of Present Illness: Spencer English is a 52 y.o. male who presents today for a work in visit. Seen for Dr. Rayann Heman.   He has a history of PAF/flutter in the setting of thyroid disease which was felt to be resolved with treatment of his thyroid back in 2015. Other issues include HTN, HLD, DVT/PE in 2010, DM, OSA with CPAP and on chronic anticoagulation. 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   He has been non compliant with his care and follow up of his coumadin. Pharmacy has been trying to change to Xarelto.   He has not seen Dr. Rayann Heman in several years. Has seen Marinus Maw, [place] a few times - last in February of 2019. He has not kept good follow up for his coumadin. He has tended to call after hours for refills.   Comes in today. Here with his wife. He notes he is short of breath. He tells me he has not felt well since July. Says he has seen PCP and ENT. Tells me that nothing wrong has been found. He feels like he can't breath out of his nose - then panics and gets anxious. Wife says he does not realize that he can breath out of his mouth. He has missed doses of his coumadin. Unclear how much of his other medicines he is actually taking as prescribed. Has had negative CXR. Recent labs look stable.  No real chest pain endorsed. He has gained weight. BP up and down but good here today. Tells me that ENT gave him some type of anxiety medicine - he does not know the name and has not started. He has Buspar on his list - he says he does not take this. He is still driving a truck - notes it is hard for him to get off to come to appointments.   Past Medical History:    Diagnosis Date  . ALLERGIC RHINITIS 04/12/2009  . Atrial fibrillation ([place])   . BACK PAIN, LUMBAR 01/14/2011  . DEEP VENOUS THROMBOPHLEBITIS, LEG, LEFT 05/05/2009  . DM 08/08/2008  . DYSLIPIDEMIA 04/26/2009  . ECZEMA 05/23/2010  . 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 GERD (gastroesophageal reflux disease)   . GOUT 10/24/2010  . Hyperglycemia   . HYPERTENSION 07/21/2007  . 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 Leukopenia   . Long term (current) use of anticoagulants 04/03/2011  . PULMONARY EMBOLISM 05/03/2009  . UNSPECIFIED URINARY CALCULUS 07/04/2010    Past Surgical History:  Procedure Laterality Date  . ANTERIOR CRUCIATE LIGAMENT REPAIR  2000   Right  . CYSTECTOMY     back of head  . KNEE ARTHROSCOPY     left knee  . KNEE ARTHROSCOPY  01/03/2013   Procedure: ARTHROSCOPY KNEE;  Surgeon: Yvette Rack., MD;  Location: [facility];  Service: Orthopedics;  Laterality: Left;  Lavage Synovectomy, Removal of loose body  . KNEE ARTHROSCOPY W/ ACL RECONSTRUCTION     right knee     Medications: Current Meds  Medication Sig  . allopurinol (ZYLOPRIM) 300 MG tablet Take 300 mg by mouth as needed (gout flare up).  Marland Kitchen atorvastatin (LIPITOR) 20  MG tablet Take 1 tablet (20 mg total) by mouth daily.  . fluticasone (FLONASE) 50 MCG/ACT nasal spray Place 1 spray into both nostrils daily. allergies  . furosemide (LASIX) 40 MG tablet TAKE 1 TABLET BY MOUTH DAILY  . halobetasol (ULTRAVATE) 0.05 % cream APPLY CREAM TOPICALLY THREE TIMES DAILY AS NEEDED FOR RASH.  Marland Kitchen losartan (COZAAR) 100 MG tablet Take 1 tablet (100 mg total) by mouth daily.  . methimazole (TAPAZOLE) 10 MG tablet TAKE 1 TABLET BY MOUTH ONCE DAILY  . metoprolol succinate (TOPROL-XL) 25 MG 24 hr tablet Take 12.5 mg by mouth daily.  . Verapamil HCl CR 300 MG CP24 Take 1 capsule (300 mg total) by mouth daily.  Marland Kitchen warfarin (COUMADIN) 5 MG tablet Take by mouth as directed by [facility]  . [DISCONTINUED] allopurinol (ZYLOPRIM) 300 MG tablet Take 1  tablet (300 mg total) by mouth at bedtime. (Patient taking differently: Take 300 mg by mouth as needed (gout flare up). )     Allergies: Allergies  Allergen Reactions  . Metformin Diarrhea and Nausea Only  . Viagra [Sildenafil Citrate] Other (See Comments)    headache    Social History: The patient  reports that he has been smoking cigars. He has never used smokeless tobacco. He reports that he drinks alcohol. He reports that he does not use drugs.   Family History: The patient's family history includes Diabetes in his father and mother; Heart attack in his father; Heart disease in his father; Hyperlipidemia in his father and mother; Hypertension in his father and mother.   Review of Systems: Please see the history of present illness.   Otherwise, the review of systems is positive for none.   All other systems are reviewed and negative.   Physical Exam: VS:  BP 120/80 (BP Location: Left Arm, Patient Position: Sitting, Cuff Size: Large)   Pulse 95   Ht 6' (1.829 m)   Wt 273 lb 12.8 oz (124.2 kg)   BMI 37.13 kg/m  .  BMI Body mass index is 37.13 kg/m.  Wt Readings from Last 3 Encounters:  10/06/18 273 lb 12.8 oz (124.2 kg)  09/15/18 262 lb 3.2 oz (118.9 kg)  09/03/18 262 lb 3.2 oz (118.9 kg)    General: Alert and in no acute distress.  His weight is up 11 pounds.   HEENT: Normal.  Neck: Supple, no JVD, carotid bruits, or masses noted.  Cardiac: Regular rate and rhythm. HR is a little fast. No edema.  Respiratory:  Lungs are clear to auscultation bilaterally with normal work of breathing.  GI: Soft and nontender.  MS: No deformity or atrophy. Gait and ROM intact.  Skin: Warm and dry. Color is normal.  Neuro:  Strength and sensation are intact and no gross focal deficits noted.  Psych: Alert, appropriate and with normal affect.   LABORATORY DATA:  EKG:  EKG is ordered today. This demonstrates NSR with nonspecific changes. HR is 95.   Lab Results  Component Value Date     WBC 4.4 09/18/2018   HGB 14.9 09/18/2018   HCT 45.6 09/18/2018   PLT 199 09/18/2018   GLUCOSE 133 (H) 09/18/2018   CHOL 206 (H) 03/25/2018   TRIG 86.0 03/25/2018   HDL 46.30 03/25/2018   LDLDIRECT 147.2 10/10/2013   LDLCALC 143 (H) 03/25/2018   ALT 18 09/18/2018   AST 18 09/18/2018   NA 146 (H) 09/18/2018   K 4.5 09/18/2018   CL 110 09/18/2018   CREATININE 1.24 09/18/2018  BUN 8 09/18/2018   CO2 28 09/18/2018   TSH 1.52 08/17/2018   PSA 1.11 03/25/2018   INR 2.3 06/29/2018   HGBA1C 6.5 (A) 08/17/2018   MICROALBUR 1.9 03/25/2018   Lab Results  Component Value Date   INR 2.3 06/29/2018   INR 1.8 (A) 05/27/2018   INR 2.44 03/09/2018   PROTIME 20.9 05/31/2009     BNP (last 3 results) Recent Labs    03/09/18 0036  BNP 36.8    ProBNP (last 3 results) No results for input(s): PROBNP in the last 8760 hours.   Other Studies Reviewed Today:  CTA CHEST IMPRESSION 02/2018: 1. No CT evidence of pulmonary embolism. 2. Cardiomegaly with mild vascular congestion and findings suggestive of a degree of pulmonary hypertension. Clinical correlation is recommended.   Electronically Signed   By: Anner Crete M.D.   On: 03/09/2018 02:08  Myoview Study Highlights 2017   Nuclear stress EF: 51%.  There was no ST segment deviation noted during stress.  The study is normal.  This is a low risk study.  The left ventricular ejection fraction is mildly decreased (45-54%).   Normal exercise nuclear stress test with no evidence of prior infarct or ischemia.  LVEF calculated at 51% but visually appears better.     Echo Study Conclusions 2017  - Left ventricle: The cavity size was normal. Systolic function was   normal. The estimated ejection fraction was in the range of 55%   to 60%. Wall motion was normal; there were no regional wall   motion abnormalities. There was an increased relative   contribution of atrial contraction to ventricular filling.   Doppler  parameters are consistent with abnormal left ventricular   relaxation (grade 1 diastolic dysfunction). - Aortic valve: Trileaflet; normal thickness, mildly calcified   leaflets. - Aorta: Aortic root dimension: 42 mm (ED). - Aortic root: The aortic root was mildly dilated. - Mitral valve: There was trivial regurgitation. - Atrial septum: A patent foramen ovale cannot be excluded. - Tricuspid valve: There was mild regurgitation. - Recommendations: Agitated saline contrast study to rule out PFO.  Recommendations:  Agitated saline contrast study to rule out PFO.    Assessment/Plan:  1. Shortness of breath/anxiety - has missed doses of coumadin - has continued to work as Administrator - has prior history of PE - had pulmonary HTN noted on last scan - will recheck CTA today.   2. PAF - he remains in sinus - this was in the setting of thyroid disease. CHADSVASC is 2.   3. Chronic anticoagulation - has been recommended to change to Xarelto - compliance with this agent will be necessary but at least he will not have to come for INR checks. He is agreeable. Sending to coumadin clinic today.   4. Prior DVT/PE  5. OSA - followed by pulmonary - this is apparently monitored by DOT  6. HTN - BP ok today.  7. Valvular heart disease - with concern for pulmonary HTN on last scan - will get his echo updated.   Current medicines are reviewed with the patient today.  The patient does not have concerns regarding medicines other than what has been noted above.  The following changes have been made:  See above.  Labs/ tests ordered today include:    Orders Placed This Encounter  Procedures  . CT ANGIO CHEST PE W OR WO CONTRAST  . Basic metabolic panel  . Pro b natriuretic peptide (BNP)  . EKG 12-Lead  .  ECHOCARDIOGRAM COMPLETE     Disposition:   FU with EP as planned next month.   Patient is agreeable to this plan and will call if any problems develop in the interim.   SignedTruitt Merle, NP  10/06/2018 2:16 PM  [facility] [address] [place], [place]  [address] Phone: [phone] Fax: [phone]

## 2018-10-07 LAB — BASIC METABOLIC PANEL
BUN/Creatinine Ratio: 6 — ABNORMAL LOW (ref 9–20)
BUN: 7 mg/dL (ref 6–24)
CO2: 24 mmol/L (ref 20–29)
Calcium: 8.8 mg/dL (ref 8.7–10.2)
Chloride: 102 mmol/L (ref 96–106)
Creatinine, Ser: 1.11 mg/dL (ref 0.76–1.27)
GFR calc Af Amer: 87 mL/min/{1.73_m2} (ref >59)
GFR calc non Af Amer: 75 mL/min/{1.73_m2} (ref >59)
Glucose: 134 mg/dL — ABNORMAL HIGH (ref 65–99)
Potassium: 4 mmol/L (ref 3.5–5.2)
Sodium: 141 mmol/L (ref 134–144)

## 2018-10-07 LAB — PRO B NATRIURETIC PEPTIDE: NT-Pro BNP: 352 pg/mL — ABNORMAL HIGH (ref 0–121)

## 2018-10-14 ENCOUNTER — Ambulatory Visit (HOSPITAL_COMMUNITY): Payer: BLUE CROSS/BLUE SHIELD | Attending: Cardiovascular Disease

## 2018-10-14 ENCOUNTER — Other Ambulatory Visit: Payer: Self-pay

## 2018-10-14 DIAGNOSIS — I2699 Other pulmonary embolism without acute cor pulmonale: Secondary | ICD-10-CM

## 2018-10-14 DIAGNOSIS — R0600 Dyspnea, unspecified: Secondary | ICD-10-CM | POA: Insufficient documentation

## 2018-10-15 ENCOUNTER — Other Ambulatory Visit: Payer: Self-pay | Admitting: *Deleted

## 2018-10-15 DIAGNOSIS — R0602 Shortness of breath: Secondary | ICD-10-CM

## 2018-10-15 DIAGNOSIS — J811 Chronic pulmonary edema: Secondary | ICD-10-CM

## 2018-10-17 ENCOUNTER — Other Ambulatory Visit: Payer: Self-pay | Admitting: Endocrinology

## 2018-10-19 ENCOUNTER — Ambulatory Visit: Payer: BLUE CROSS/BLUE SHIELD | Admitting: Allergy & Immunology

## 2018-10-21 ENCOUNTER — Encounter: Payer: Self-pay | Admitting: Pulmonary Disease

## 2018-10-21 ENCOUNTER — Ambulatory Visit (INDEPENDENT_AMBULATORY_CARE_PROVIDER_SITE_OTHER): Payer: BLUE CROSS/BLUE SHIELD | Admitting: Pulmonary Disease

## 2018-10-21 VITALS — BP 146/84 | HR 87 | Ht 72.0 in | Wt 263.0 lb

## 2018-10-21 DIAGNOSIS — R0600 Dyspnea, unspecified: Secondary | ICD-10-CM

## 2018-10-21 DIAGNOSIS — Z9989 Dependence on other enabling machines and devices: Secondary | ICD-10-CM

## 2018-10-21 DIAGNOSIS — G4733 Obstructive sleep apnea (adult) (pediatric): Secondary | ICD-10-CM | POA: Diagnosis not present

## 2018-10-21 DIAGNOSIS — R0602 Shortness of breath: Secondary | ICD-10-CM

## 2018-10-21 NOTE — Assessment & Plan Note (Signed)
We could not obtain download from his DME.  He will bring in the cards a week and review download and check his pressure settings.  If he qualifies, we will send in prescription for new CPAP

## 2018-10-21 NOTE — Progress Notes (Signed)
Subjective:    Patient ID: Spencer English, male    DOB: Feb 22, 1966, 52 y.o.   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  Today he presents for evaluation of a new problem of dyspnea that has been ongoing for the last 3 months slowly progressive.  He reports dyspnea on routine activities.  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 Ebony Hail.  He was noted to be hypertensive and started on BiDil and he correlates his symptoms with using BiDil.  He has seen ENT for blockage of his nose ([place]) , no pathology was found in due to his anxiety he was advised to take an anxiolytic. He has hypothyroidism on Tapazole and I note that TSH was 1.58/2019. There is no wheezing, orthopnea paroxysmal nocturnal dyspnea.  He reports occasional pedal edema which he has had for a long time especially when he is driving for long distances  Echo in 2013 that showed mild pulmonary hypertension with RVSP of 43 but this seems to have resolved on follow-up echo.  He has had at least 2 CAT scans which were negative for pulmonary embolism recently.  He has seen cardiology and nuclear stress test was negative, LVEF was calculated at 51% but visually appeared better  For his OSA, he wonders if he can get a new machine.  I do not have a download on his machine he reports good compliance, denies any problems with mask or pressure.  He would also like a letter like the one of few of his colleagues have obtained with state that per DOT rules, he should rest for at least 10 hours before starting his shift.  He feels that he is asked to work or frequently without a 10-hour break in between      Significant tests/ events reviewed  PSG 07/03/14 >> AHI 33.6, SaO2  low 76%. CPAP 8 cm H2O   Spirometry 09/2018> no airway obstruction, ratio 80, FEV1 74%, FVC 73% consistent with mild restriction  Echo 09/2018 normal LV function normal RVSP  CT angiogram 09/2018- for pulmonary embolism, s/o pulmonary retention   Past Medical History:  Diagnosis Date  . ALLERGIC RHINITIS 04/12/2009  . Atrial fibrillation ([place])   . BACK PAIN, LUMBAR 01/14/2011  . DEEP VENOUS THROMBOPHLEBITIS, LEG, LEFT 05/05/2009  . DM 08/08/2008  . DYSLIPIDEMIA 04/26/2009  . ECZEMA 05/23/2010  . 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 GERD (gastroesophageal reflux disease)   . GOUT 10/24/2010  . Hyperglycemia   . HYPERTENSION 07/21/2007  . 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 Leukopenia   . Long term (current) use of anticoagulants 04/03/2011  . PULMONARY EMBOLISM 05/03/2009  . UNSPECIFIED URINARY CALCULUS 07/04/2010    Past Surgical History:  Procedure Laterality Date  . ANTERIOR CRUCIATE LIGAMENT REPAIR  2000   Right  . CYSTECTOMY     back of head  . KNEE ARTHROSCOPY     left knee  . KNEE ARTHROSCOPY  01/03/2013   Procedure: ARTHROSCOPY KNEE;  Surgeon: Yvette Rack., MD;  Location: [facility];  Service: Orthopedics;  Laterality: Left;  Lavage Synovectomy, Removal of loose body  . KNEE ARTHROSCOPY W/ ACL RECONSTRUCTION     right knee  Allergies  Allergen Reactions  . Metformin Diarrhea and Nausea Only  . Viagra [Sildenafil Citrate] Other (See Comments)    headache    Social History   Socioeconomic History  . Marital status: Legally Separated    Spouse name: Not on file  . Number of children: 3  . Years of education: Not on file  . Highest education level: Not on file  Occupational History  . Occupation: Truck Education administrator: [employer]    Employer: [employer]  Social Needs  . Financial resource strain: Not on file  . Food insecurity:    Worry: Not on file    Inability: Not on file  . Transportation needs:    Medical: Not on file     Non-medical: Not on file  Tobacco Use  . Smoking status: Former Smoker    Types: Cigars    Last attempt to quit: 07/29/2018    Years since quitting: 0.2  . Smokeless tobacco: Never Used  Substance and Sexual Activity  . Alcohol use: Yes    Comment: Beer everyother weekend   . Drug use: No  . Sexual activity: Not on file  Lifestyle  . Physical activity:    Days per week: Not on file    Minutes per session: Not on file  . Stress: Not on file  Relationships  . Social connections:    Talks on phone: Not on file    Gets together: Not on file    Attends religious service: Not on file    Active member of club or organization: Not on file    Attends meetings of clubs or organizations: Not on file    Relationship status: Not on file  . Intimate partner violence:    Fear of current or ex partner: Not on file    Emotionally abused: Not on file    Physically abused: Not on file    Forced sexual activity: Not on file  Other Topics Concern  . Not on file  Social History Narrative  . Not on file     Family History  Problem Relation Age of Onset  . Diabetes Mother   . Hypertension Mother   . Hyperlipidemia Mother   . Diabetes Father   . Hypertension Father   . Hyperlipidemia Father   . Heart attack Father   . Heart disease Father       Review of Systems Patient denies ,cough, hemoptysis,  chest pain, palpitations, pedal edema, orthopnea, paroxysmal nocturnal dyspnea, lightheadedness, nausea, vomiting, abdominal or  leg pains      Objective:   Physical Exam  Gen. Pleasant, obese, in no distress ENT - no lesions, no post nasal drip Neck: No JVD, no thyromegaly, no carotid bruits Lungs: no use of accessory muscles, no dullness to percussion, decreased without rales or rhonchi  Cardiovascular: Rhythm regular, heart sounds  normal, no murmurs or gallops, 1+ peripheral edema Musculoskeletal: No deformities, no cyanosis or clubbing , no tremors       Assessment & Plan:

## 2018-10-21 NOTE — Assessment & Plan Note (Signed)
No cardiopulmonary cause is apparent.  He does not seem to have significant airway obstruction.  Mild restriction may be related to obesity, there is no evidence of heart failure or fibrosis on CT scan. CT angiogram does not show acute pulmonary embolism and there is no evidence of chronic pulmonary embolism either.  Echo does not show significant residual pulmonary hypertension.  He seems to correlate his symptoms with BiDil and I have asked him to stop this for 2 to 4 weeks to see if dyspnea improves.  If not he should follow-up with his PCP and be assessed for anxiety.  He does admit to significantly increased stress both at work and in his personal life

## 2018-10-21 NOTE — Patient Instructions (Signed)
Lung function shows mild restriction  No other cause of shortness of breath found so far-stop taking BiDil for 2 weeks and then get back with Dr. Loanne Drilling to recheck your blood pressure and assess for anxiety if shortness of breath still persists  Bring in the card so that we can review the report on your machine and write prescription for new one

## 2018-10-30 DIAGNOSIS — F4323 Adjustment disorder with mixed anxiety and depressed mood: Secondary | ICD-10-CM | POA: Diagnosis not present

## 2018-11-02 ENCOUNTER — Ambulatory Visit: Payer: BLUE CROSS/BLUE SHIELD | Admitting: Physician Assistant

## 2018-11-15 ENCOUNTER — Ambulatory Visit: Payer: BLUE CROSS/BLUE SHIELD | Admitting: Physician Assistant

## 2018-11-28 ENCOUNTER — Other Ambulatory Visit: Payer: Self-pay | Admitting: Endocrinology

## 2018-11-28 NOTE — Telephone Encounter (Signed)
Please refill x 3 months Further refills would have to be considered by new PCP   

## 2018-11-29 NOTE — Telephone Encounter (Signed)
Please advise if refill is appropriate 

## 2018-11-30 ENCOUNTER — Other Ambulatory Visit: Payer: Self-pay

## 2018-11-30 ENCOUNTER — Telehealth: Payer: Self-pay | Admitting: Endocrinology

## 2018-11-30 DIAGNOSIS — I272 Pulmonary hypertension, unspecified: Secondary | ICD-10-CM

## 2018-11-30 MED ORDER — VERAPAMIL HCL ER 300 MG PO CP24: 1.0000 | ORAL_CAPSULE | Freq: Every day | ORAL | 2 refills | Status: DC

## 2018-11-30 NOTE — Telephone Encounter (Signed)
Please refill x 3 months Further refills would have to be considered by new PCP   

## 2018-11-30 NOTE — Telephone Encounter (Signed)
Approved Medications    furosemide (LASIX) 40 MG tablet       Sig: TAKE 1 TABLET BY MOUTH EVERY DAY   Disp:  30 tablet  Refills:  2 (Pharmacy requested: 0)   Start: 11/29/2018   Class: Normal   Authorized by: Aleatha Borer, LPN   Non-formulary   To pharmacy: Future refill request will need to come from PCP      To be filled at: [facility] [place] - [address]     Rx has been resent again today.

## 2018-11-30 NOTE — Telephone Encounter (Signed)
Patient called and advised that he is completely out of the Verapamil HCI CR 300 MG. States that his pharmacy requested a refill 2 days ago and it has not been filled as of today.    Please call and advise

## 2018-12-02 ENCOUNTER — Other Ambulatory Visit: Payer: Self-pay | Admitting: Endocrinology

## 2018-12-02 ENCOUNTER — Telehealth: Payer: Self-pay | Admitting: Endocrinology

## 2018-12-02 NOTE — Telephone Encounter (Signed)
Please refill x 1 Further refills would have to be considered by new PCP  

## 2018-12-02 NOTE — Telephone Encounter (Signed)
Patient called needing a refill on Halobetasol 0.05% cream. Patient would like to pick this refill up at [facility] on [address]. Please advise.

## 2018-12-02 NOTE — Telephone Encounter (Signed)
DG-Plz see message that pt left/he states that his BP is not well controlled and that he was on a "medication for black people but it caused me to have diarrhea so I stopped.  I'm thinking I need to go back on it?"/Thx dmf

## 2018-12-02 NOTE — Telephone Encounter (Signed)
As of 10/29/18, Dr. Loanne Drilling is no longer doing primary care. Pt is followed by cardiology and should discuss this with them. Appears he has seen [facility] and other providers within their practice. Pt was sent a letter to establish care with a PCP, list of PCP's accepting new pts was also included. Letter was mailed in July. Please call pt to make him aware.

## 2018-12-02 NOTE — Telephone Encounter (Signed)
Patient has called stating that his blood pressure is high and he needs medication adjusted.

## 2018-12-02 NOTE — Telephone Encounter (Signed)
Please advise if refill is appropriate 

## 2018-12-02 NOTE — Telephone Encounter (Signed)
Please refer to pt request for refill below. Do you want pt to schedule an appt for evaluation? Has not yet established with a PCP. Pt has also called stating his blood pressure is not well controlled, experienced diarrhea when taking antihypertensive and discontinued use. Please advise

## 2018-12-06 NOTE — Telephone Encounter (Signed)
Attempted to call the pt back and no answer and VM is full.

## 2018-12-06 NOTE — Telephone Encounter (Signed)
Please call - according to my last note, I did not start any new medicine for BP.   Unclear which medicine he is referring to.   Can we verify his medicines and then let me review?   Thanks Lysha Schrade

## 2018-12-07 ENCOUNTER — Encounter (HOSPITAL_COMMUNITY): Payer: Self-pay | Admitting: Emergency Medicine

## 2018-12-07 ENCOUNTER — Emergency Department (HOSPITAL_COMMUNITY)
Admission: EM | Admit: 2018-12-07 | Discharge: 2018-12-07 | Disposition: A | Discharge status: Home / Self Care | Payer: BLUE CROSS/BLUE SHIELD | Attending: Emergency Medicine | Admitting: Emergency Medicine

## 2018-12-07 ENCOUNTER — Other Ambulatory Visit: Payer: Self-pay

## 2018-12-07 ENCOUNTER — Telehealth: Payer: Self-pay | Admitting: Endocrinology

## 2018-12-07 DIAGNOSIS — Z7901 Long term (current) use of anticoagulants: Secondary | ICD-10-CM | POA: Diagnosis not present

## 2018-12-07 DIAGNOSIS — R42 Dizziness and giddiness: Secondary | ICD-10-CM | POA: Diagnosis not present

## 2018-12-07 DIAGNOSIS — Z87891 Personal history of nicotine dependence: Secondary | ICD-10-CM | POA: Diagnosis not present

## 2018-12-07 DIAGNOSIS — Z79899 Other long term (current) drug therapy: Secondary | ICD-10-CM | POA: Insufficient documentation

## 2018-12-07 DIAGNOSIS — I10 Essential (primary) hypertension: Secondary | ICD-10-CM | POA: Insufficient documentation

## 2018-12-07 DIAGNOSIS — D689 Coagulation defect, unspecified: Secondary | ICD-10-CM | POA: Diagnosis not present

## 2018-12-07 LAB — URINALYSIS, ROUTINE W REFLEX MICROSCOPIC
Bilirubin Urine: NEGATIVE
GLUCOSE, UA: NEGATIVE mg/dL
Hgb urine dipstick: NEGATIVE
Ketones, ur: NEGATIVE mg/dL
Leukocytes, UA: NEGATIVE
Nitrite: NEGATIVE
PH: 7 (ref 5.0–8.0)
Protein, ur: NEGATIVE mg/dL
SPECIFIC GRAVITY, URINE: 1.009 (ref 1.005–1.030)

## 2018-12-07 LAB — BASIC METABOLIC PANEL
Anion gap: 12 (ref 5–15)
BUN: 9 mg/dL (ref 6–20)
CHLORIDE: 103 mmol/L (ref 98–111)
CO2: 26 mmol/L (ref 22–32)
CREATININE: 1.14 mg/dL (ref 0.61–1.24)
Calcium: 8.7 mg/dL — ABNORMAL LOW (ref 8.9–10.3)
GFR calc non Af Amer: >60 mL/min (ref >60)
Glucose, Bld: 123 mg/dL — ABNORMAL HIGH (ref 70–99)
Potassium: 3.7 mmol/L (ref 3.5–5.1)
Sodium: 141 mmol/L (ref 135–145)

## 2018-12-07 LAB — CBC
HEMATOCRIT: 50.5 % (ref 39.0–52.0)
Hemoglobin: 15.7 g/dL (ref 13.0–17.0)
MCH: 27.9 pg (ref 26.0–34.0)
MCHC: 31.1 g/dL (ref 30.0–36.0)
MCV: 89.7 fL (ref 80.0–100.0)
Platelets: 199 10*3/uL (ref 150–400)
RBC: 5.63 MIL/uL (ref 4.22–5.81)
RDW: 16.2 % — AB (ref 11.5–15.5)
WBC: 4.3 10*3/uL (ref 4.0–10.5)
nRBC: 0 % (ref 0.0–0.2)

## 2018-12-07 MED ORDER — SITAGLIPTIN PHOSPHATE 100 MG PO TABS: 100.0000 mg | ORAL_TABLET | Freq: Every day | ORAL | 3 refills | Status: DC

## 2018-12-07 MED ORDER — ONDANSETRON 4 MG PO TBDP: 4.0000 mg | ORAL_TABLET | Freq: Four times a day (QID) | ORAL | 0 refills | Status: DC | PRN

## 2018-12-07 NOTE — Telephone Encounter (Signed)
Called pt to clarify request further. Records indicate he was not on this medication. Pt became very hostile about being asked if he was in fact taking Januvia. Pt proceeded to continue to yell in to the phone stating, "I have been taking this medication for years", "Dr. Loanne Drilling has been my doctor for many years", and "he should know what I am taking". Reassured pt that the reason for the call was NOT to upset him. However, for his safety, we want to ensure this was a valid request and that he was in fact taking the medication. Pt further added, "I take Januvia 100mg  daily". Advised I would inform Dr. Loanne Drilling.

## 2018-12-07 NOTE — Discharge Instructions (Signed)
Steps to find a Primary Care Provider (PCP): ° °Call [phone] or [phone] to access "[facility] Find a Doctor Service." ° °2.  You may also go on the [facility] website at [email or web address] ° °3.  [facility] also frequently accepts new patients. ° °[facility]  °[address] °[place] [address] °[phone] ° °4.  There are also multiple [facility] and [facility] practices throughout the [place] that are frequently accepting new patients. You may find a clinic that is close to your home and contact them. ° °[facility] °[email or web address] °[phone] ° °[facility] °[email or web address] ° °[facility] °[email or web address] °[phone] ° °[facility] °[email or web address] °[phone] ° °5.  Local Health Departments also can provide primary care services. ° °[facility]  °[address] °[place] [address] °[phone] ° °[facility] °[address] °[place] [address] °[phone] ° °[facility] °[address]  °[place] [address] °[phone] ° ° °

## 2018-12-07 NOTE — Telephone Encounter (Signed)
I am not seeing this medication on his medication list. Please advise if this refill request is appropriate

## 2018-12-07 NOTE — Telephone Encounter (Signed)
Ok, I sent rx

## 2018-12-07 NOTE — Telephone Encounter (Signed)
MEDICATION: Janovia  PHARMACY:  Contacted. [facility] [phone]  IS THIS A 90 DAY SUPPLY : No  IS PATIENT OUT OF MEDICATION: Yes   IF NOT; HOW MUCH IS LEFT:   LAST APPOINTMENT DATE: @12 /04/2018  NEXT APPOINTMENT DATE:@2 /20/2020  DO WE HAVE YOUR PERMISSION TO LEAVE A DETAILED MESSAGE: Yes   OTHER COMMENTS:  Please call patient.  **Let patient know to contact pharmacy at the end of the day to make sure medication is ready. **  ** Please notify patient to allow 48-72 hours to process**  **Encourage patient to contact the pharmacy for refills or they can request refills through [facility]**

## 2018-12-07 NOTE — Addendum Note (Signed)
Addended by: Renato Shin on: 12/07/2018 03:04 PM   Modules accepted: Orders

## 2018-12-07 NOTE — ED Provider Notes (Signed)
TIME SEEN: 11:16 PM  CHIEF COMPLAINT: Hypertension  HPI: Patient is a 52 year old male with history of hypertension, PE and DVT on Xarelto, atrial fibrillation, dyslipidemia who presents to the emergency department with concerns for hypertension.  Patient reports for the past 3 weeks he has had intermittent episodes of lightheadedness and nausea mostly with standing.  Lightheadedness resolves after several seconds.  No vertigo.  [facility] and checked his blood pressure and it was 145/112.  States his blood pressure is normally not as elevated.  He is on verapamil and losartan and reports compliance.  States that he went to urgent care today and his blood pressure was elevated there as well so they sent him to the emergency department.  He states he has had some shortness of breath for 3 to 4 months but none currently.  No chest pain.  No headache.  No vision changes.  No numbness, weakness, speech changes.  States he did have some left lower quadrant pain for 4 days last week that has now gone.  No fevers, vomiting or diarrhea.  States that his urine has been "real yellow" and "smelled sweet" and he was worried he had a urinary tract infection.  No dysuria or hematuria.  Does not currently have a primary care provider.  States he wanted to be checked for a urinary tract infection and to make sure his blood sugar was normal.  ROS: See HPI Constitutional: no fever  Eyes: no drainage  ENT: no runny nose   Cardiovascular:  no chest pain  Resp: no SOB  GI: no vomiting GU: no dysuria Integumentary: no rash  Allergy: no hives  Musculoskeletal: no leg swelling  Neurological: no slurred speech ROS otherwise negative  PAST MEDICAL HISTORY/PAST SURGICAL HISTORY:  Past Medical History:  Diagnosis Date  . ALLERGIC RHINITIS 04/12/2009  . Atrial fibrillation ([place])   . BACK PAIN, LUMBAR 01/14/2011  . DEEP VENOUS THROMBOPHLEBITIS, LEG, LEFT 05/05/2009  . DM 08/08/2008  .  DYSLIPIDEMIA 04/26/2009  . ECZEMA 05/23/2010  . 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 GERD (gastroesophageal reflux disease)   . GOUT 10/24/2010  . Hyperglycemia   . HYPERTENSION 07/21/2007  . 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 Leukopenia   . Long term (current) use of anticoagulants 04/03/2011  . PULMONARY EMBOLISM 05/03/2009  . UNSPECIFIED URINARY CALCULUS 07/04/2010    MEDICATIONS:  Prior to Admission medications   Medication Sig Start Date End Date Taking? Authorizing Provider  allopurinol (ZYLOPRIM) 300 MG tablet Take 300 mg by mouth as needed (gout flare up).    [provider]  atorvastatin (LIPITOR) 20 MG tablet Take 1 tablet (20 mg total) by mouth daily. 03/26/18   Renato Shin, MD  fluticasone (FLONASE) 50 MCG/ACT nasal spray Place 1 spray into both nostrils daily. allergies    [provider]  furosemide (LASIX) 40 MG tablet TAKE 1 TABLET BY MOUTH EVERY DAY 11/29/18   Renato Shin, MD  halobetasol (ULTRAVATE) 0.05 % cream APPLY CREAM TOPICALLY THREE TIMES DAILY AS NEEDED FOR RASH 12/02/18   Renato Shin, MD  losartan (COZAAR) 100 MG tablet Take 1 tablet (100 mg total) by mouth daily. 12/10/17   Renato Shin, MD  methimazole (TAPAZOLE) 10 MG tablet TAKE 1 TABLET BY MOUTH ONCE DAILY 07/14/18   Renato Shin, MD  metoprolol succinate (TOPROL-XL) 25 MG 24 hr tablet Take 12.5 mg by mouth daily.    [provider]  metoprolol succinate (TOPROL-XL) 25 MG 24 hr tablet  TAKE ONE-HALF TABLET(12.5MG  TOTAL) BY MOUTH DAILY 10/17/18   Renato Shin, MD  rivaroxaban (XARELTO) 20 MG TABS tablet Take 1 tablet (20 mg total) by mouth daily with supper. 10/06/18   Allred, Jeneen Rinks, MD  sitaGLIPtin (JANUVIA) 100 MG tablet Take 1 tablet (100 mg total) by mouth daily. 12/07/18   Renato Shin, MD  Verapamil HCl CR 300 MG CP24 TAKE 1 CAPSULE ONCE DAILY 12/01/18   Renato Shin, MD  Verapamil HCl CR 300 MG CP24 Take 1 capsule (300 mg total) by mouth daily. 11/30/18   Renato Shin, MD     ALLERGIES:  Allergies  Allergen Reactions  . Metformin Diarrhea and Nausea Only  . Viagra [Sildenafil Citrate] Other (See Comments)    headache    SOCIAL HISTORY:  Social History   Tobacco Use  . Smoking status: Former Smoker    Types: Cigars    Last attempt to quit: 07/29/2018    Years since quitting: 0.3  . Smokeless tobacco: Never Used  Substance Use Topics  . Alcohol use: Yes    Comment: Beer everyother weekend     FAMILY HISTORY: Family History  Problem Relation Age of Onset  . Diabetes Mother   . Hypertension Mother   . Hyperlipidemia Mother   . Diabetes Father   . Hypertension Father   . Hyperlipidemia Father   . Heart attack Father   . Heart disease Father     EXAM: BP (!) 165/98 (BP Location: Left Arm)   Pulse 62   Temp 98 F (36.7 C) (Oral)   Resp 16   Ht 6' (1.829 m)   Wt 120.2 kg   SpO2 99%   BMI 35.94 kg/m  CONSTITUTIONAL: Alert and oriented and responds appropriately to questions. Well-appearing; well-nourished HEAD: Normocephalic EYES: Conjunctivae clear, pupils appear equal, EOMI ENT: normal nose; moist mucous membranes NECK: Supple, no meningismus, no nuchal rigidity, no LAD  CARD: RRR; S1 and S2 appreciated; no murmurs, no clicks, no rubs, no gallops RESP: Normal chest excursion without splinting or tachypnea; breath sounds clear and equal bilaterally; no wheezes, no rhonchi, no rales, no hypoxia or respiratory distress, speaking full sentences ABD/GI: Normal bowel sounds; non-distended; soft, non-tender, no rebound, no guarding, no peritoneal signs, no hepatosplenomegaly BACK:  The back appears normal and is non-tender to palpation, there is no CVA tenderness EXT: Normal ROM in all joints; non-tender to palpation; no edema; normal capillary refill; no cyanosis, no calf tenderness or swelling    SKIN: Normal color for age and race; warm; no rash NEURO: Moves all extremities equally, sensation to light touch intact diffusely, cranial  nerves II through XII intact, normal speech PSYCH: The patient's mood and manner are appropriate. Grooming and personal hygiene are appropriate.  MEDICAL DECISION MAKING: Patient here with hypertension, at this time asymptomatic.  He complains of intermittent lightheadedness and nausea with standing for the past 3 weeks.  No vertiginous symptoms.  No lightheadedness currently.  Also has had months worth of shortness of breath but none currently and no chest pain.  Screening labs, urine obtained in triage are unremarkable.  No signs of endorgan damage from hypertension.  Doubt stroke, ACS. His blood glucose is 123.  No sign of UTI.  His EKG shows no significant change compared to previous.  His blood pressure has improved to 165/98 without intervention.  Given this is asymptomatic hypertension, I do not feel he needs further emergent work-up.  I have recommended that he establish care with a primary care physician  as he may need changes in his blood pressure medication regimen as an outpatient.  We will give him a list of primary care physicians to follow-up with.  I have also recommended that he keep a log of his blood pressure daily so he can take this to a primary care physician.  We discussed return precautions.  Patient verbalized understanding.  At this time, I do not feel there is any life-threatening condition present. I have reviewed and discussed all results (EKG, imaging, lab, urine as appropriate) and exam findings with patient/family. I have reviewed nursing notes and appropriate previous records.  I feel the patient is safe to be discharged home without further emergent workup and can continue workup as an outpatient as needed. Discussed usual and customary return precautions. Patient/family verbalize understanding and are comfortable with this plan.  Outpatient follow-up has been provided as needed. All questions have been answered.     EKG Interpretation  Date/Time:  Tuesday December 07 2018 20:00:15 EST Ventricular Rate:  65 PR Interval:    QRS Duration: 88 QT Interval:  418 QTC Calculation: 435 R Axis:   -7 Text Interpretation:  Sinus rhythm Abnormal R-wave progression, early transition Borderline T abnormalities, lateral leads No significant change since last tracing Confirmed by Pryor Curia [phone]) on 12/07/2018 11:09:14 PM         Chana Lindstrom, Delice Bison, DO 12/07/18 [id number]

## 2018-12-07 NOTE — Telephone Encounter (Signed)
Spoke with pt re: messages below. Pt didn't have any bp readings since 1 from 2 weeks ago, and it's hard to adjust medications without having blood pressure readings.  Pt was advised, per Higinio Roger, to keep his upcoming appt with Donnajean Lopes, PA, 12/17/18.  If he is able to check his bp between now and then, to bring a log with him.  Otherwise, he is to keep this appt and bring all his current meds that he is taking. Pt verbalized understanding.

## 2018-12-07 NOTE — ED Triage Notes (Addendum)
Pt reports having elevated blood pressure after being sent from Urgent Care. Pt does have hx of HTN and DM. Pt was concerned with UTI symptoms and increasing dizziness when standing.

## 2018-12-07 NOTE — ED Notes (Signed)
Bed: QA44 Expected date:  Expected time:  Means of arrival:  Comments: Jacqualin Combes

## 2018-12-07 NOTE — Telephone Encounter (Signed)
please call patient: Are you on this med?

## 2018-12-07 NOTE — ED Notes (Signed)
RN made aware of pts elevated BP 

## 2018-12-07 NOTE — Telephone Encounter (Signed)
Called pt and informed Rx has been sent and to follow up with pharmacy. Verbalized acceptance and understanding.

## 2018-12-09 ENCOUNTER — Telehealth: Payer: Self-pay | Admitting: Physician Assistant

## 2018-12-09 NOTE — Telephone Encounter (Signed)
New message   Pt c/o BP issue: STAT if pt c/o blurred vision, one-sided weakness or slurred speech  1. What are your last 5 BP readings? 171/145- Tuesday, went to ER. 163/90-after leaving ER.   2. Are you having any other symptoms (ex. Dizziness, headache, blurred vision, passed out)? Headache, dizziness, lightheaded, nauseous   3. What is your BP issue? Pt states his BP is running high and he recently went to ER. He said that his PCP isn't seeing patients any more and he was told that Joseph Art could give him medication for his BP.

## 2018-12-09 NOTE — Telephone Encounter (Signed)
Spoke to patient who has concerned about BP and symptoms.  He said that on Tuesday he was at work and developed a headache, light headedness, dizziness and nausea.  He checked his BP and according to him was "170/145".    He then went to urgent care and they sent him to the ED and his BP again was, according to patient was "171/145", no record in ED note.  When he left the ED it was 163/90.    There are discrepincies with the Bps between patient and documentation.  He is on Losartan 100 mg daily and Metoprolol Succinate 12.5 mg daily.  He is concerned about having a "stroke".  He is requesting more medication.  I told him to take an additional Metoprolol 12.5 mg when he gets home this evening and call us in the morning with BP reading.  Please advise, thank you.  Is HTN clinic a good move?

## 2018-12-14 ENCOUNTER — Telehealth: Payer: Self-pay | Admitting: *Deleted

## 2018-12-14 ENCOUNTER — Ambulatory Visit: Payer: BLUE CROSS/BLUE SHIELD | Admitting: Family Medicine

## 2018-12-14 DIAGNOSIS — Z0289 Encounter for other administrative examinations: Secondary | ICD-10-CM

## 2018-12-14 NOTE — Telephone Encounter (Signed)
LMOVM TO CALL CLINIC BACK TO SET UP APPT WITH [facility] PHARM-D

## 2018-12-14 NOTE — Telephone Encounter (Signed)
AWAITING PHONE Colp AN APPT WITH [facility]

## 2018-12-17 ENCOUNTER — Encounter: Payer: Self-pay | Admitting: Family Medicine

## 2018-12-17 ENCOUNTER — Ambulatory Visit: Payer: BLUE CROSS/BLUE SHIELD | Admitting: Family Medicine

## 2018-12-17 ENCOUNTER — Ambulatory Visit: Payer: BLUE CROSS/BLUE SHIELD | Admitting: Physician Assistant

## 2018-12-17 VITALS — BP 148/98 | HR 62 | Temp 98.0°F | Ht 72.0 in | Wt 270.0 lb

## 2018-12-17 DIAGNOSIS — I10 Essential (primary) hypertension: Secondary | ICD-10-CM

## 2018-12-17 DIAGNOSIS — E0590 Thyrotoxicosis, unspecified without thyrotoxic crisis or storm: Secondary | ICD-10-CM

## 2018-12-17 DIAGNOSIS — E119 Type 2 diabetes mellitus without complications: Secondary | ICD-10-CM | POA: Diagnosis not present

## 2018-12-17 DIAGNOSIS — Z86711 Personal history of pulmonary embolism: Secondary | ICD-10-CM

## 2018-12-17 DIAGNOSIS — I4891 Unspecified atrial fibrillation: Secondary | ICD-10-CM

## 2018-12-17 MED ORDER — METOPROLOL SUCCINATE ER 25 MG PO TB24: 37.5000 mg | ORAL_TABLET | Freq: Every day | ORAL | 3 refills | Status: DC

## 2018-12-17 NOTE — Progress Notes (Signed)
Subjective:    Patient ID: Spencer English, male    DOB: 06-30-1966, 52 y.o.   MRN: [id number]  No chief complaint on file.   HPI Patient was seen today for follow-up on chronic conditions and TOC, previously seen by Dr. Loanne Drilling.  HTN: -Endorses recent increase in BP -Currently taking metoprolol 25 mg, verapamil 300 mg, losartan 100 mg -pt increased the metoprolol.  He also states he restarted Bidil this wk. -Was on BiDil, but caused headaches, vomiting -Endorses BP at home 225/160 -12/07/2018 ED visit for BP -pt had appt with [facility] today, but cancelled it. -per phone notes [facility] left VM to schedule pt in HTN clinic.  DM type II: -Pt was taking Januvia 100 mg daily. -Med was d/c'd as pt lost weight -Not checking FSBS at home -Endorses recent visit with Dr. Loanne Drilling -Last hemoglobin A1c 6.5% 08/17/2018  Hyperthyroidism: -Diagnosed after syncopal episode -Taking methimazole 10 mg -requesting TSH check  H/o of PE: -endorses 3 PE s 8 yrs ago -on xarelto -was on coumadin but couldn't keep INR appts -denies SOB, CP, lightheadedness  H/o Afib: -on xarelto -followed by [facility] -on metorprolol, verapamil -denies CP, SOB, LE edema, dizziness  Past Medical History:  Diagnosis Date  . ALLERGIC RHINITIS 04/12/2009  . Atrial fibrillation ([place])   . BACK PAIN, LUMBAR 01/14/2011  . DEEP VENOUS THROMBOPHLEBITIS, LEG, LEFT 05/05/2009  . DM 08/08/2008  . DYSLIPIDEMIA 04/26/2009  . ECZEMA 05/23/2010  . 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 GERD (gastroesophageal reflux disease)   . GOUT 10/24/2010  . Hyperglycemia   . HYPERTENSION 07/21/2007  . 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 Leukopenia   . Long term (current) use of anticoagulants 04/03/2011  . PULMONARY EMBOLISM 05/03/2009  . UNSPECIFIED URINARY CALCULUS 07/04/2010    Allergies  Allergen Reactions  . Metformin Diarrhea and Nausea Only  . Viagra [Sildenafil Citrate] Other (See Comments)    headache    ROS General: Denies fever, chills,  night sweats, changes in weight, changes in appetite HEENT: Denies headaches, ear pain, changes in vision, rhinorrhea, sore throat CV: Denies CP, palpitations, SOB, orthopnea Pulm: Denies SOB, cough, wheezing GI: Denies abdominal pain, nausea, vomiting, diarrhea, constipation GU: Denies dysuria, hematuria, frequency, vaginal discharge Msk: Denies muscle cramps, joint pains Neuro: Denies weakness, numbness, tingling Skin: Denies rashes, bruising Psych: Denies depression, anxiety, hallucinations    Objective:    Blood pressure (!) 148/98, pulse 62, temperature 98 F (36.7 C), temperature source Oral, height 6' (1.829 m), weight 270 lb (122.5 kg), SpO2 98 %.  Gen. Pleasant, well-nourished, in no distress, normal affect, poor historian  HEENT: [place]/AT, face symmetric,no scleral icterus, PERRLA, nares patent without drainage, pharynx without erythema or exudate. Lungs: no accessory muscle use, CTAB, no wheezes or rales Cardiovascular: RRR, no m/r/g, no peripheral edema Neuro:  A&Ox3, CN II-XII intact, normal gait Skin:  Warm, no lesions/ rash   Wt Readings from Last 3 Encounters:  12/17/18 270 lb (122.5 kg)  12/07/18 265 lb (120.2 kg)  10/21/18 263 lb (119.3 kg)    Lab Results  Component Value Date   WBC 4.3 12/07/2018   HGB 15.7 12/07/2018   HCT 50.5 12/07/2018   PLT 199 12/07/2018   GLUCOSE 123 (H) 12/07/2018   CHOL 206 (H) 03/25/2018   TRIG 86.0 03/25/2018   HDL 46.30 03/25/2018   LDLDIRECT 147.2 10/10/2013   LDLCALC 143 (H) 03/25/2018   ALT 18 09/18/2018   AST 18 09/18/2018   NA 141 12/07/2018   K 3.7 12/07/2018  CL 103 12/07/2018   CREATININE 1.14 12/07/2018   BUN 9 12/07/2018   CO2 26 12/07/2018   TSH 1.52 08/17/2018   PSA 1.11 03/25/2018   INR 1.9 (A) 10/06/2018   HGBA1C 6.5 (A) 08/17/2018   MICROALBUR 1.9 03/25/2018    Assessment/Plan:  Essential hypertension  -elevated -continue losartan 100 mg, verapamil 300 mg -will increase toprol xl to 37.5 mg   -pt strongly advised to contact [facility]/HTN clinic as would benefit from medication review.  Concerns about medication and understanding of dz process.   -Pt advised to stop taking Bidil at this time given h/o HAs and vomiting. -pt to bring bp monitor, readings, and medications to office visits - Plan: metoprolol succinate (TOPROL-XL) 25 MG 24 hr tablet -Given RTC or ED precautions  Hyperthyroidism -Continue methimazole 10 mg - Plan: TSH -Continue follow-up with Dr. Loanne Drilling, endocrinology  Type 2 diabetes mellitus without complication, without long-term current use of insulin (HCC) -Continue Januvia 100 mg -Patient encouraged to check FSBS -Continue follow-up with Dr. Loanne Drilling, endocrinology  History of pulmonary embolus ([facility]) -Continue Xarelto  Atrial fibrillation, unspecified type ([place]) -Regular rhythm -Continue follow-up with cardiology - Plan: metoprolol succinate (TOPROL-XL) 25 MG 24 hr tablet   F/u prn in the next few months  Grier Mitts, MD

## 2018-12-17 NOTE — Patient Instructions (Signed)
DASH Eating Plan DASH stands for "Dietary Approaches to Stop Hypertension." The DASH eating plan is a healthy eating plan that has been shown to reduce high blood pressure (hypertension). It may also reduce your risk for type 2 diabetes, heart disease, and stroke. The DASH eating plan may also help with weight loss. What are tips for following this plan?  General guidelines  Avoid eating more than 2,300 mg (milligrams) of salt (sodium) a day. If you have hypertension, you may need to reduce your sodium intake to 1,500 mg a day.  Limit alcohol intake to no more than 1 drink a day for nonpregnant women and 2 drinks a day for men. One drink equals 12 oz of beer, 5 oz of wine, or 1 oz of hard liquor.  Work with your health care provider to maintain a healthy body weight or to lose weight. Ask what an ideal weight is for you.  Get at least 30 minutes of exercise that causes your heart to beat faster (aerobic exercise) most days of the week. Activities may include walking, swimming, or biking.  Work with your health care provider or diet and nutrition specialist (dietitian) to adjust your eating plan to your individual calorie needs. Reading food labels   Check food labels for the amount of sodium per serving. Choose foods with less than 5 percent of the Daily Value of sodium. Generally, foods with less than 300 mg of sodium per serving fit into this eating plan.  To find whole grains, look for the word "whole" as the first word in the ingredient list. Shopping  Buy products labeled as "low-sodium" or "no salt added."  Buy fresh foods. Avoid canned foods and premade or frozen meals. Cooking  Avoid adding salt when cooking. Use salt-free seasonings or herbs instead of table salt or sea salt. Check with your health care provider or pharmacist before using salt substitutes.  Do not fry foods. Cook foods using healthy methods such as baking, boiling, grilling, and broiling instead.  Cook with  heart-healthy oils, such as olive, canola, soybean, or sunflower oil. Meal planning  Eat a balanced diet that includes: ? 5 or more servings of fruits and vegetables each day. At each meal, try to fill half of your plate with fruits and vegetables. ? Up to 6-8 servings of whole grains each day. ? Less than 6 oz of lean meat, poultry, or fish each day. A 3-oz serving of meat is about the same size as a deck of cards. One egg equals 1 oz. ? 2 servings of low-fat dairy each day. ? A serving of nuts, seeds, or beans 5 times each week. ? Heart-healthy fats. Healthy fats called Omega-3 fatty acids are found in foods such as flaxseeds and coldwater fish, like sardines, salmon, and mackerel.  Limit how much you eat of the following: ? Canned or prepackaged foods. ? Food that is high in trans fat, such as fried foods. ? Food that is high in saturated fat, such as fatty meat. ? Sweets, desserts, sugary drinks, and other foods with added sugar. ? Full-fat dairy products.  Do not salt foods before eating.  Try to eat at least 2 vegetarian meals each week.  Eat more home-cooked food and less restaurant, buffet, and fast food.  When eating at a restaurant, ask that your food be prepared with less salt or no salt, if possible. What foods are recommended? The items listed may not be a complete list. Talk with your dietitian about   what dietary choices are best for you. Grains Whole-grain or whole-wheat bread. Whole-grain or whole-wheat pasta. Brown rice. Oatmeal. Quinoa. Bulgur. Whole-grain and low-sodium cereals. Pita bread. Low-fat, low-sodium crackers. Whole-wheat flour tortillas. Vegetables Fresh or frozen vegetables (raw, steamed, roasted, or grilled). Low-sodium or reduced-sodium tomato and vegetable juice. Low-sodium or reduced-sodium tomato sauce and tomato paste. Low-sodium or reduced-sodium canned vegetables. Fruits All fresh, dried, or frozen fruit. Canned fruit in natural juice (without  added sugar). Meat and other protein foods Skinless chicken or turkey. Ground chicken or turkey. Pork with fat trimmed off. Fish and seafood. Egg whites. Dried beans, peas, or lentils. Unsalted nuts, nut butters, and seeds. Unsalted canned beans. Lean cuts of beef with fat trimmed off. Low-sodium, lean deli meat. Dairy Low-fat (1%) or fat-free (skim) milk. Fat-free, low-fat, or reduced-fat cheeses. Nonfat, low-sodium ricotta or cottage cheese. Low-fat or nonfat yogurt. Low-fat, low-sodium cheese. Fats and oils Soft margarine without trans fats. Vegetable oil. Low-fat, reduced-fat, or light mayonnaise and salad dressings (reduced-sodium). Canola, safflower, olive, soybean, and sunflower oils. Avocado. Seasoning and other foods Herbs. Spices. Seasoning mixes without salt. Unsalted popcorn and pretzels. Fat-free sweets. What foods are not recommended? The items listed may not be a complete list. Talk with your dietitian about what dietary choices are best for you. Grains Baked goods made with fat, such as croissants, muffins, or some breads. Dry pasta or rice meal packs. Vegetables Creamed or fried vegetables. Vegetables in a cheese sauce. Regular canned vegetables (not low-sodium or reduced-sodium). Regular canned tomato sauce and paste (not low-sodium or reduced-sodium). Regular tomato and vegetable juice (not low-sodium or reduced-sodium). Pickles. Olives. Fruits Canned fruit in a light or heavy syrup. Fried fruit. Fruit in cream or butter sauce. Meat and other protein foods Fatty cuts of meat. Ribs. Fried meat. Bacon. Sausage. Bologna and other processed lunch meats. Salami. Fatback. Hotdogs. Bratwurst. Salted nuts and seeds. Canned beans with added salt. Canned or smoked fish. Whole eggs or egg yolks. Chicken or turkey with skin. Dairy Whole or 2% milk, cream, and half-and-half. Whole or full-fat cream cheese. Whole-fat or sweetened yogurt. Full-fat cheese. Nondairy creamers. Whipped toppings.  Processed cheese and cheese spreads. Fats and oils Butter. Stick margarine. Lard. Shortening. Ghee. Bacon fat. Tropical oils, such as coconut, palm kernel, or palm oil. Seasoning and other foods Salted popcorn and pretzels. Onion salt, garlic salt, seasoned salt, table salt, and sea salt. Worcestershire sauce. Tartar sauce. Barbecue sauce. Teriyaki sauce. Soy sauce, including reduced-sodium. Steak sauce. Canned and packaged gravies. Fish sauce. Oyster sauce. Cocktail sauce. Horseradish that you find on the shelf. Ketchup. Mustard. Meat flavorings and tenderizers. Bouillon cubes. Hot sauce and Tabasco sauce. Premade or packaged marinades. Premade or packaged taco seasonings. Relishes. Regular salad dressings. Where to find more information:  National Heart, Lung, and Blood Institute: www.nhlbi.nih.gov  American Heart Association: www.heart.org Summary  The DASH eating plan is a healthy eating plan that has been shown to reduce high blood pressure (hypertension). It may also reduce your risk for type 2 diabetes, heart disease, and stroke.  With the DASH eating plan, you should limit salt (sodium) intake to 2,300 mg a day. If you have hypertension, you may need to reduce your sodium intake to 1,500 mg a day.  When on the DASH eating plan, aim to eat more fresh fruits and vegetables, whole grains, lean proteins, low-fat dairy, and heart-healthy fats.  Work with your health care provider or diet and nutrition specialist (dietitian) to adjust your eating plan to your   individual calorie needs. This information is not intended to replace advice given to you by your health care provider. Make sure you discuss any questions you have with your health care provider. Document Released: 12/04/2011 Document Revised: 12/08/2016 Document Reviewed: 12/08/2016 Elsevier Interactive Patient Education  2019 [employer].  Managing Your Hypertension Hypertension is commonly called high blood pressure. This is when  the force of your blood pressing against the walls of your arteries is too strong. Arteries are blood vessels that carry blood from your heart throughout your body. Hypertension forces the heart to work harder to pump blood, and may cause the arteries to become narrow or stiff. Having untreated or uncontrolled hypertension can cause heart attack, stroke, kidney disease, and other problems. What are blood pressure readings? A blood pressure reading consists of a higher number over a lower number. Ideally, your blood pressure should be below 120/80. The first ("top") number is called the systolic pressure. It is a measure of the pressure in your arteries as your heart beats. The second ("bottom") number is called the diastolic pressure. It is a measure of the pressure in your arteries as the heart relaxes. What does my blood pressure reading mean? Blood pressure is classified into four stages. Based on your blood pressure reading, your health care provider may use the following stages to determine what type of treatment you need, if any. Systolic pressure and diastolic pressure are measured in a unit called mm Hg. Normal  Systolic pressure: below 376.  Diastolic pressure: below 80. Elevated  Systolic pressure: 283-151.  Diastolic pressure: below 80. Hypertension stage 1  Systolic pressure: 761-607.  Diastolic pressure: 37-10. Hypertension stage 2  Systolic pressure: 626 or above.  Diastolic pressure: 90 or above. What health risks are associated with hypertension? Managing your hypertension is an important responsibility. Uncontrolled hypertension can lead to:  A heart attack.  A stroke.  A weakened blood vessel (aneurysm).  Heart failure.  Kidney damage.  Eye damage.  Metabolic syndrome.  Memory and concentration problems. What changes can I make to manage my hypertension? Hypertension can be managed by making lifestyle changes and possibly by taking medicines. Your health  care provider will help you make a plan to bring your blood pressure within a normal range. Eating and drinking   Eat a diet that is high in fiber and potassium, and low in salt (sodium), added sugar, and fat. An example eating plan is called the DASH (Dietary Approaches to Stop Hypertension) diet. To eat this way: ? Eat plenty of fresh fruits and vegetables. Try to fill half of your plate at each meal with fruits and vegetables. ? Eat whole grains, such as whole wheat pasta, brown rice, or whole grain bread. Fill about one quarter of your plate with whole grains. ? Eat low-fat diary products. ? Avoid fatty cuts of meat, processed or cured meats, and poultry with skin. Fill about one quarter of your plate with lean proteins such as fish, chicken without skin, beans, eggs, and tofu. ? Avoid premade and processed foods. These tend to be higher in sodium, added sugar, and fat.  Reduce your daily sodium intake. Most people with hypertension should eat less than 1,500 mg of sodium a day.  Limit alcohol intake to no more than 1 drink a day for nonpregnant women and 2 drinks a day for men. One drink equals 12 oz of beer, 5 oz of wine, or 1 oz of hard liquor. Lifestyle  Work with your health care  provider to maintain a healthy body weight, or to lose weight. Ask what an ideal weight is for you.  Get at least 30 minutes of exercise that causes your heart to beat faster (aerobic exercise) most days of the week. Activities may include walking, swimming, or biking.  Include exercise to strengthen your muscles (resistance exercise), such as weight lifting, as part of your weekly exercise routine. Try to do these types of exercises for 30 minutes at least 3 days a week.  Do not use any products that contain nicotine or tobacco, such as cigarettes and e-cigarettes. If you need help quitting, ask your health care provider.  Control any long-term (chronic) conditions you have, such as high cholesterol or  diabetes. Monitoring  Monitor your blood pressure at home as told by your health care provider. Your personal target blood pressure may vary depending on your medical conditions, your age, and other factors.  Have your blood pressure checked regularly, as often as told by your health care provider. Working with your health care provider  Review all the medicines you take with your health care provider because there may be side effects or interactions.  Talk with your health care provider about your diet, exercise habits, and other lifestyle factors that may be contributing to hypertension.  Visit your health care provider regularly. Your health care provider can help you create and adjust your plan for managing hypertension. Will I need medicine to control my blood pressure? Your health care provider may prescribe medicine if lifestyle changes are not enough to get your blood pressure under control, and if:  Your systolic blood pressure is 130 or higher.  Your diastolic blood pressure is 80 or higher. Take medicines only as told by your health care provider. Follow the directions carefully. Blood pressure medicines must be taken as prescribed. The medicine does not work as well when you skip doses. Skipping doses also puts you at risk for problems. Contact a health care provider if:  You think you are having a reaction to medicines you have taken.  You have repeated (recurrent) headaches.  You feel dizzy.  You have swelling in your ankles.  You have trouble with your vision. Get help right away if:  You develop a severe headache or confusion.  You have unusual weakness or numbness, or you feel faint.  You have severe pain in your chest or abdomen.  You vomit repeatedly.  You have trouble breathing. Summary  Hypertension is when the force of blood pumping through your arteries is too strong. If this condition is not controlled, it may put you at risk for serious  complications.  Your personal target blood pressure may vary depending on your medical conditions, your age, and other factors. For most people, a normal blood pressure is less than 120/80.  Hypertension is managed by lifestyle changes, medicines, or both. Lifestyle changes include weight loss, eating a healthy, low-sodium diet, exercising more, and limiting alcohol. This information is not intended to replace advice given to you by your health care provider. Make sure you discuss any questions you have with your health care provider. Document Released: 09/08/2012 Document Revised: 11/12/2016 Document Reviewed: 11/12/2016 Elsevier Interactive Patient Education  2019 [employer].  Hyperthyroidism  Hyperthyroidism is when the thyroid gland is too active (overactive). The thyroid gland is a small gland located in the lower front part of the neck, just in front of the windpipe (trachea). This gland makes hormones that help control how the body uses food  for energy (metabolism) as well as how the heart and brain function. These hormones also play a role in keeping your bones strong. When the thyroid is overactive, it produces too much of a hormone called thyroxine. What are the causes? This condition may be caused by:  Graves' disease. This is a disorder in which the body's disease-fighting system (immune system) attacks the thyroid gland. This is the most common cause.  Inflammation of the thyroid gland.  A tumor in the thyroid gland.  Use of certain medicines, including: ? Prescription thyroid hormone replacement. ? Herbal supplements that mimic thyroid hormones. ? Amiodarone therapy.  Solid or fluid-filled lumps within your thyroid gland (thyroid nodules).  Taking in a large amount of iodine from foods or medicines. What increases the risk? You are more likely to develop this condition if:  You are male.  You have a family history of thyroid conditions.  You smoke  tobacco.  You use a medicine called lithium.  You take medicines that affect the immune system (immunosuppressants). What are the signs or symptoms? Symptoms of this condition include:  Nervousness.  Inability to tolerate heat.  Unexplained weight loss.  Diarrhea.  Change in the texture of hair or skin.  Heart skipping beats or making extra beats.  Rapid heart rate.  Loss of menstruation.  Shaky hands.  Fatigue.  Restlessness.  Sleep problems.  Enlarged thyroid gland or a lump in the thyroid (nodule). You may also have symptoms of Graves' disease, which may include:  Protruding eyes.  Dry eyes.  Red or swollen eyes.  Problems with vision. How is this diagnosed? This condition may be diagnosed based on:  Your symptoms and medical history.  A physical exam.  Blood tests.  Thyroid ultrasound. This test involves using sound waves to produce images of the thyroid gland.  A thyroid scan. A radioactive substance is injected into a vein, and images show how much iodine is present in the thyroid.  Radioactive iodine uptake test (RAIU). A small amount of radioactive iodine is given by mouth to see how much iodine the thyroid absorbs after a certain amount of time. How is this treated? Treatment depends on the cause and severity of the condition. Treatment may include:  Medicines to reduce the amount of thyroid hormone your body makes.  Radioactive iodine treatment (radioiodine therapy). This involves swallowing a small dose of radioactive iodine, in capsule or liquid form, to kill thyroid cells.  Surgery to remove part or all of your thyroid gland. You may need to take thyroid hormone replacement medicine for the rest of your life after thyroid surgery.  Medicines to help manage your symptoms. Follow these instructions at home:   Take over-the-counter and prescription medicines only as told by your health care provider.  Do not use any products that  contain nicotine or tobacco, such as cigarettes and e-cigarettes. If you need help quitting, ask your health care provider.  Follow any instructions from your health care provider about diet. You may be instructed to limit foods that contain iodine.  Keep all follow-up visits as told by your health care provider. This is important. ? You will need to have blood tests regularly so that your health care provider can monitor your condition. Contact a health care provider if:  Your symptoms do not get better with treatment.  You have a fever.  You are taking thyroid hormone replacement medicine and you: ? Have symptoms of depression. ? Feel like you are tired all the  time. ? Gain weight. Get help right away if:  You have chest pain.  You have decreased alertness or a change in your awareness.  You have abdominal pain.  You feel dizzy.  You have a rapid heartbeat.  You have an irregular heartbeat.  You have difficulty breathing. Summary  The thyroid gland is a small gland located in the lower front part of the neck, just in front of the windpipe (trachea).  Hyperthyroidism is when the thyroid gland is too active (overactive) and produces too much of a hormone called thyroxine.  The most common cause is Graves' disease, a disorder in which your immune system attacks the thyroid gland.  Hyperthyroidism can cause various symptoms, such as unexplained weight loss, nervousness, inability to tolerate heat, or changes in your heartbeat.  Treatment may include medicine to reduce the amount of thyroid hormone your body makes, radioiodine therapy, surgery, or medicines to manage symptoms. This information is not intended to replace advice given to you by your health care provider. Make sure you discuss any questions you have with your health care provider. Document Released: 12/15/2005 Document Revised: 11/25/2017 Document Reviewed: 11/25/2017 Elsevier Interactive Patient Education   2019 [employer].

## 2018-12-21 ENCOUNTER — Other Ambulatory Visit: Payer: Self-pay | Admitting: Endocrinology

## 2018-12-23 ENCOUNTER — Telehealth: Payer: Self-pay | Admitting: Physician Assistant

## 2018-12-23 MED ORDER — SPIRONOLACTONE 25 MG PO TABS: 25.0000 mg | ORAL_TABLET | Freq: Every day | ORAL | 11 refills | Status: DC

## 2018-12-23 NOTE — Telephone Encounter (Signed)
Reviewed advice with patient and advised him he will have lab work done at appointment on 1/9.  I advised him of  HOW TO TAKE YOUR BLOOD PRESSURE:  Rest 5 minutes before taking your blood pressure.   Don't smoke or drink caffeinated beverages for at least 30 minutes before.  Take your blood pressure before (not after) you eat.  Sit comfortably with your back supported and both feet on the floor (don't cross your legs).  Elevate your arm to heart level on a table or a desk.  Use the proper sized cuff. It should fit smoothly and snugly around your bare upper arm. There should be enough room to slip a fingertip under the cuff. The bottom edge of the cuff should be 1 inch above the crease of the elbow.  Patient states his BP cuff is new. I advised him to write down any readings he gets and bring to appointment on 1/9. He verbalized understanding and agreement with plan and will call back with questions or concerns.

## 2018-12-23 NOTE — Telephone Encounter (Signed)
Spoke with patient who states he continues to have high BP. States the readings he gets at home are high but he also got a reading of 183/155 mmHg yesterday and was told to go to ER. States at ER no medication changes were made. Saw new PCP, Dr. Volanda Napoleon, on 12/20 and she increased Toprol to 37.5 mg daily. He does not know his pulse readings. He denies high sodium diet. Had lab work at PCP office. There are no sooner appointments than 1/9 with PharmD available. I advised I will speak with PharmD for advice and call him back. He verbalized understanding and agreement and thanked me for the call.

## 2018-12-23 NOTE — Telephone Encounter (Signed)
New Message         Patient is calling in today to see if he can get something else called in to help lower his bp. Patient was asked to make an appt for the pharmacist, in which he did. Patient recently went to the Urgent Care and at that visit  His bp was 171/145, patient was told to go to the ER right away. Per patient his bp is still not normal. Pls call and advise.

## 2018-12-23 NOTE — Telephone Encounter (Signed)
Reviewed patient's concern with Fuller Canada, RPH. She advised patient start Spironolactone 25 mg daily and keep appointment for 1/9. She advised they will check a bmet at that visit. I attempted to call patient, no answer and unable to leave message due to voice mailbox is full.

## 2018-12-30 DIAGNOSIS — J40 Bronchitis, not specified as acute or chronic: Secondary | ICD-10-CM | POA: Diagnosis not present

## 2019-01-06 ENCOUNTER — Ambulatory Visit: Payer: BLUE CROSS/BLUE SHIELD | Admitting: Nurse Practitioner

## 2019-01-06 ENCOUNTER — Ambulatory Visit: Payer: BLUE CROSS/BLUE SHIELD

## 2019-01-06 NOTE — Progress Notes (Deleted)
Electrophysiology Office Note Date: 01/06/2019  ID:  Spencer English, DOB 05/16/1966, MRN [id number]  PCP: Billie Ruddy, MD Primary Cardiologist: *** Electrophysiologist: ***  CC: ***  Spencer English is a 53 y.o. male seen today for ***.  {He/she (caps):30048} presents today for routine electrophysiology followup.  Since last being seen in our clinic, the patient reports doing very well.  {He/she (caps):30048} denies chest pain, palpitations, dyspnea, PND, orthopnea, nausea, vomiting, dizziness, syncope, edema, weight gain, or early satiety.  Past Medical History:  Diagnosis Date  . ALLERGIC RHINITIS 04/12/2009  . Atrial fibrillation ([place])   . BACK PAIN, LUMBAR 01/14/2011  . DEEP VENOUS THROMBOPHLEBITIS, LEG, LEFT 05/05/2009  . DM 08/08/2008  . DYSLIPIDEMIA 04/26/2009  . ECZEMA 05/23/2010  . 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 GERD (gastroesophageal reflux disease)   . GOUT 10/24/2010  . Hyperglycemia   . HYPERTENSION 07/21/2007  . 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 Leukopenia   . Long term (current) use of anticoagulants 04/03/2011  . PULMONARY EMBOLISM 05/03/2009  . UNSPECIFIED URINARY CALCULUS 07/04/2010   Past Surgical History:  Procedure Laterality Date  . ANTERIOR CRUCIATE LIGAMENT REPAIR  2000   Right  . CYSTECTOMY     back of head  . KNEE ARTHROSCOPY     left knee  . KNEE ARTHROSCOPY  01/03/2013   Procedure: ARTHROSCOPY KNEE;  Surgeon: Yvette Rack., MD;  Location: [facility];  Service: Orthopedics;  Laterality: Left;  Lavage Synovectomy, Removal of loose body  . KNEE ARTHROSCOPY W/ ACL RECONSTRUCTION     right knee    Current Outpatient Medications  Medication Sig Dispense Refill  . allopurinol (ZYLOPRIM) 300 MG tablet Take 300 mg by mouth as needed (gout flare up).    Marland Kitchen atorvastatin (LIPITOR) 20 MG tablet Take 1 tablet (20 mg total) by mouth daily. 90 tablet 3  . fluticasone (FLONASE) 50 MCG/ACT nasal spray Place 1 spray into both nostrils daily. allergies    .  furosemide (LASIX) 40 MG tablet TAKE 1 TABLET BY MOUTH EVERY DAY 30 tablet 2  . halobetasol (ULTRAVATE) 0.05 % cream APPLY CREAM TOPICALLY THREE TIMES DAILY AS NEEDED FOR RASH 15 g 0  . losartan (COZAAR) 100 MG tablet Take 1 tablet (100 mg total) by mouth daily. 30 tablet 11  . methimazole (TAPAZOLE) 10 MG tablet TAKE 1 TABLET BY MOUTH ONCE DAILY 90 tablet 0  . metoprolol succinate (TOPROL-XL) 25 MG 24 hr tablet Take 1.5 tablets (37.5 mg total) by mouth daily. 45 tablet 3  . ondansetron (ZOFRAN ODT) 4 MG disintegrating tablet Take 1 tablet (4 mg total) by mouth every 6 (six) hours as needed for nausea or vomiting. 20 tablet 0  . rivaroxaban (XARELTO) 20 MG TABS tablet Take 1 tablet (20 mg total) by mouth daily with supper. 90 tablet 0  . sitaGLIPtin (JANUVIA) 100 MG tablet Take 1 tablet (100 mg total) by mouth daily. 90 tablet 3  . spironolactone (ALDACTONE) 25 MG tablet Take 1 tablet (25 mg total) by mouth daily. 30 tablet 11  . Verapamil HCl CR 300 MG CP24 Take 1 capsule (300 mg total) by mouth daily. 30 capsule 2   No current facility-administered medications for this visit.     Allergies:   Metformin and Viagra [sildenafil citrate]   Social History: Social History   Socioeconomic History  . Marital status: Married    Spouse name: Not on file  . Number of children: 3  . Years of  education: Not on file  . Highest education level: Not on file  Occupational History  . 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 Financial resource strain: Not on file  . Food insecurity:    Worry: Not on file    Inability: Not on file  . Transportation needs:    Medical: Not on file    Non-medical: Not on file  Tobacco Use  . Smoking status: Former Smoker    Types: Cigars    Last attempt to quit: 07/29/2018    Years since quitting: 0.4  . Smokeless tobacco: Never Used  Substance and Sexual Activity  . Alcohol use: Yes     Comment: Beer everyother weekend   . Drug use: No  . Sexual activity: Not on file  Lifestyle  . Physical activity:    Days per week: Not on file    Minutes per session: Not on file  . Stress: Not on file  Relationships  . Social connections:    Talks on phone: Not on file    Gets together: Not on file    Attends religious service: Not on file    Active member of club or organization: Not on file    Attends meetings of clubs or organizations: Not on file    Relationship status: Not on file  . Intimate partner violence:    Fear of current or ex partner: Not on file    Emotionally abused: Not on file    Physically abused: Not on file    Forced sexual activity: Not on file  Other Topics Concern  . Not on file  Social History Narrative  . Not on file    Family History: Family History  Problem Relation Age of Onset  . Diabetes Mother   . Hypertension Mother   . Hyperlipidemia Mother   . Diabetes Father   . Hypertension Father   . Hyperlipidemia Father   . Heart attack Father   . Heart disease Father     Review of Systems: All other systems reviewed and are otherwise negative except as noted above.   Physical Exam: VS:  There were no vitals taken for this visit. , BMI There is no height or weight on file to calculate BMI. Wt Readings from Last 3 Encounters:  12/17/18 270 lb (122.5 kg)  12/07/18 265 lb (120.2 kg)  10/21/18 263 lb (119.3 kg)    GEN- The patient is well appearing, alert and oriented x 3 today.   HEENT: normocephalic, atraumatic; sclera clear, conjunctiva pink; hearing intact; oropharynx clear; neck supple, no JVP Lymph- no cervical lymphadenopathy Lungs- Clear to ausculation bilaterally, normal work of breathing.  No wheezes, rales, rhonchi Heart- Regular rate and rhythm, no murmurs, rubs or gallops, PMI not laterally displaced GI- soft, non-tender, non-distended, bowel sounds present, no hepatosplenomegaly Extremities- no clubbing, cyanosis, or edema;  DP/PT/radial pulses 2+ bilaterally MS- no significant deformity or atrophy Skin- warm and dry, no rash or lesion  Psych- euthymic mood, full affect Neuro- strength and sensation are intact   EKG:  EKG {ACTION; IS/IS UEK:80034917} ordered today. The ekg ordered today shows ***  Recent Labs: 03/09/2018: B Natriuretic Peptide 36.8 08/17/2018: TSH 1.52 09/18/2018: ALT 18 10/06/2018: NT-Pro BNP 352 12/07/2018: BUN 9; Creatinine, Ser 1.14; Hemoglobin 15.7; Platelets 199; Potassium 3.7; Sodium 141    Other studies Reviewed: Additional studies/ records that were reviewed today include: ***  Review  of the above records today demonstrates: ***  Assessment and Plan:  ***   Current medicines are reviewed at length with the patient today.   The patient {ACTIONS; HAS/DOES NOT HAVE:19233} concerns regarding his medicines.  The following changes were made today:  {NONE DEFAULTED:18576::"none"}  Labs/ tests ordered today include: *** No orders of the defined types were placed in this encounter.    Disposition:   Follow up with *** {gen number 5-05:183358} {TIME; UNITS DAY/WEEK/MONTH:19136}   Signed, Chanetta Marshall, NP 01/06/2019 5:46 AM   [place] [address] [phone] (office) [phone] (fax)

## 2019-01-06 NOTE — Progress Notes (Deleted)
Patient ID: Spencer English                 DOB: [birth year]                      MRN: [id number]     HPI: Spencer English is a 53 y.o. male referred by Dr. Charlcie Cradle to HTN clinic. PMH is significant for PAF/flutter, HTN, HLD, DVT/PE in 2010, DM, OSA with CPAP, and on chronic anticoagulation. On 12/09/18, patient called clinic with complains of high BP (170/145) with headache, lightheadedness, dizziness, and nausea. Patient was referred to BP clinic. Patient called again on 12/22/18 with BP reading of 185/155, where he was instructed to go to ER and initiate spironolactone 25 mg daily.  Home BP? S/sx of high BP 12/07/18 K 3.7, Scr 1.14 Increase metop 100 daily or spiro 50 or initiate amlodipine 2.5 or 5 Diet  Exercise  Current HTN meds: losartan 100 mg daily, metoprolol toprol 37.5 mg daily, spironolactone 25 mg daily  Previously tried: hydralazine 10 mg daily, irbesartan 300 mg daily, olmesartan 40 mg daily  BP goal: <130/80  Family History: Father with diabetes, heart attack, HLD, and HTN, and heart disease. Mother with daibetes, HLD, and HTN.  Social History: The patient  reports that he has been smoking cigars. He has never used smokeless tobacco. He reports that he drinks alcohol. He reports that he does not use drugs.  Diet:   Exercise:   Home BP readings:   Wt Readings from Last 3 Encounters:  12/17/18 270 lb (122.5 kg)  12/07/18 265 lb (120.2 kg)  10/21/18 263 lb (119.3 kg)   BP Readings from Last 3 Encounters:  12/17/18 (!) 148/98  12/07/18 (!) 165/98  10/21/18 (!) 146/84   Pulse Readings from Last 3 Encounters:  12/17/18 62  12/07/18 62  10/21/18 87    Renal function: CrCl cannot be calculated (Patient's most recent lab result is older than the maximum 21 days allowed.).  Past Medical History:  Diagnosis Date  . ALLERGIC RHINITIS 04/12/2009  . Atrial fibrillation ([place])   . BACK PAIN, LUMBAR 01/14/2011  . DEEP VENOUS THROMBOPHLEBITIS, LEG, LEFT 05/05/2009  . DM  08/08/2008  . DYSLIPIDEMIA 04/26/2009  . ECZEMA 05/23/2010  . 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 GERD (gastroesophageal reflux disease)   . GOUT 10/24/2010  . Hyperglycemia   . HYPERTENSION 07/21/2007  . 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 Leukopenia   . Long term (current) use of anticoagulants 04/03/2011  . PULMONARY EMBOLISM 05/03/2009  . UNSPECIFIED URINARY CALCULUS 07/04/2010    Current Outpatient Medications on File Prior to Visit  Medication Sig Dispense Refill  . allopurinol (ZYLOPRIM) 300 MG tablet Take 300 mg by mouth as needed (gout flare up).    Marland Kitchen atorvastatin (LIPITOR) 20 MG tablet Take 1 tablet (20 mg total) by mouth daily. 90 tablet 3  . fluticasone (FLONASE) 50 MCG/ACT nasal spray Place 1 spray into both nostrils daily. allergies    . furosemide (LASIX) 40 MG tablet TAKE 1 TABLET BY MOUTH EVERY DAY 30 tablet 2  . halobetasol (ULTRAVATE) 0.05 % cream APPLY CREAM TOPICALLY THREE TIMES DAILY AS NEEDED FOR RASH 15 g 0  . losartan (COZAAR) 100 MG tablet Take 1 tablet (100 mg total) by mouth daily. 30 tablet 11  . methimazole (TAPAZOLE) 10 MG tablet TAKE 1 TABLET BY MOUTH ONCE DAILY 90 tablet 0  . metoprolol succinate (TOPROL-XL) 25 MG 24 hr tablet Take 1.5 tablets (37.5  mg total) by mouth daily. 45 tablet 3  . ondansetron (ZOFRAN ODT) 4 MG disintegrating tablet Take 1 tablet (4 mg total) by mouth every 6 (six) hours as needed for nausea or vomiting. 20 tablet 0  . rivaroxaban (XARELTO) 20 MG TABS tablet Take 1 tablet (20 mg total) by mouth daily with supper. 90 tablet 0  . sitaGLIPtin (JANUVIA) 100 MG tablet Take 1 tablet (100 mg total) by mouth daily. 90 tablet 3  . spironolactone (ALDACTONE) 25 MG tablet Take 1 tablet (25 mg total) by mouth daily. 30 tablet 11  . Verapamil HCl CR 300 MG CP24 Take 1 capsule (300 mg total) by mouth daily. 30 capsule 2   No current facility-administered medications on file prior to visit.     Allergies  Allergen Reactions  . Metformin Diarrhea and  Nausea Only  . Viagra [Sildenafil Citrate] Other (See Comments)    headache     Assessment/Plan:  1. Hypertension -

## 2019-01-25 NOTE — Progress Notes (Deleted)
Electrophysiology Office Note Date: 01/25/2019  ID:  Spencer English, DOB 1966/09/26, MRN [id number]  PCP: Billie Ruddy, MD Electrophysiologist: Rayann Heman  CC: follow up  Spencer English is a 53 y.o. male seen today for Dr Rayann Heman.  He presents today for routine electrophysiology followup.  Since last being seen in our clinic, the patient reports doing very well.  He denies chest pain, palpitations, dyspnea, PND, orthopnea, nausea, vomiting, dizziness, syncope, edema, weight gain, or early satiety.  Past Medical History:  Diagnosis Date  . ALLERGIC RHINITIS 04/12/2009  . Atrial fibrillation ([place])   . BACK PAIN, LUMBAR 01/14/2011  . DEEP VENOUS THROMBOPHLEBITIS, LEG, LEFT 05/05/2009  . DM 08/08/2008  . DYSLIPIDEMIA 04/26/2009  . ECZEMA 05/23/2010  . 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 GERD (gastroesophageal reflux disease)   . GOUT 10/24/2010  . Hyperglycemia   . HYPERTENSION 07/21/2007  . 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 Leukopenia   . Long term (current) use of anticoagulants 04/03/2011  . PULMONARY EMBOLISM 05/03/2009  . UNSPECIFIED URINARY CALCULUS 07/04/2010   Past Surgical History:  Procedure Laterality Date  . ANTERIOR CRUCIATE LIGAMENT REPAIR  2000   Right  . CYSTECTOMY     back of head  . KNEE ARTHROSCOPY     left knee  . KNEE ARTHROSCOPY  01/03/2013   Procedure: ARTHROSCOPY KNEE;  Surgeon: Yvette Rack., MD;  Location: [facility];  Service: Orthopedics;  Laterality: Left;  Lavage Synovectomy, Removal of loose body  . KNEE ARTHROSCOPY W/ ACL RECONSTRUCTION     right knee    Current Outpatient Medications  Medication Sig Dispense Refill  . allopurinol (ZYLOPRIM) 300 MG tablet Take 300 mg by mouth as needed (gout flare up).    Marland Kitchen atorvastatin (LIPITOR) 20 MG tablet Take 1 tablet (20 mg total) by mouth daily. 90 tablet 3  . fluticasone (FLONASE) 50 MCG/ACT nasal spray Place 1 spray into both nostrils daily. allergies    . furosemide (LASIX) 40 MG tablet TAKE 1 TABLET BY MOUTH  EVERY DAY 30 tablet 2  . halobetasol (ULTRAVATE) 0.05 % cream APPLY CREAM TOPICALLY THREE TIMES DAILY AS NEEDED FOR RASH 15 g 0  . losartan (COZAAR) 100 MG tablet Take 1 tablet (100 mg total) by mouth daily. 30 tablet 11  . methimazole (TAPAZOLE) 10 MG tablet TAKE 1 TABLET BY MOUTH ONCE DAILY 90 tablet 0  . metoprolol succinate (TOPROL-XL) 25 MG 24 hr tablet Take 1.5 tablets (37.5 mg total) by mouth daily. 45 tablet 3  . ondansetron (ZOFRAN ODT) 4 MG disintegrating tablet Take 1 tablet (4 mg total) by mouth every 6 (six) hours as needed for nausea or vomiting. 20 tablet 0  . rivaroxaban (XARELTO) 20 MG TABS tablet Take 1 tablet (20 mg total) by mouth daily with supper. 90 tablet 0  . sitaGLIPtin (JANUVIA) 100 MG tablet Take 1 tablet (100 mg total) by mouth daily. 90 tablet 3  . spironolactone (ALDACTONE) 25 MG tablet Take 1 tablet (25 mg total) by mouth daily. 30 tablet 11  . Verapamil HCl CR 300 MG CP24 Take 1 capsule (300 mg total) by mouth daily. 30 capsule 2   No current facility-administered medications for this visit.     Allergies:   Metformin and Viagra [sildenafil citrate]   Social History: Social History   Socioeconomic History  . Marital status: Married    Spouse name: Not on file  . Number of children: 3  . Years of education: Not on  file  . Highest education level: Not on file  Occupational History  . 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 Financial resource strain: Not on file  . Food insecurity:    Worry: Not on file    Inability: Not on file  . Transportation needs:    Medical: Not on file    Non-medical: Not on file  Tobacco Use  . Smoking status: Former Smoker    Types: Cigars    Last attempt to quit: 07/29/2018    Years since quitting: 0.4  . Smokeless tobacco: Never Used  Substance and Sexual Activity  . Alcohol use: Yes    Comment: Beer everyother weekend   . Drug use: No  .  Sexual activity: Not on file  Lifestyle  . Physical activity:    Days per week: Not on file    Minutes per session: Not on file  . Stress: Not on file  Relationships  . Social connections:    Talks on phone: Not on file    Gets together: Not on file    Attends religious service: Not on file    Active member of club or organization: Not on file    Attends meetings of clubs or organizations: Not on file    Relationship status: Not on file  . Intimate partner violence:    Fear of current or ex partner: Not on file    Emotionally abused: Not on file    Physically abused: Not on file    Forced sexual activity: Not on file  Other Topics Concern  . Not on file  Social History Narrative  . Not on file    Family History: Family History  Problem Relation Age of Onset  . Diabetes Mother   . Hypertension Mother   . Hyperlipidemia Mother   . Diabetes Father   . Hypertension Father   . Hyperlipidemia Father   . Heart attack Father   . Heart disease Father     Review of Systems: All other systems reviewed and are otherwise negative except as noted above.   Physical Exam: VS:  There were no vitals taken for this visit. , BMI There is no height or weight on file to calculate BMI. Wt Readings from Last 3 Encounters:  12/17/18 270 lb (122.5 kg)  12/07/18 265 lb (120.2 kg)  10/21/18 263 lb (119.3 kg)    GEN- The patient is well appearing, alert and oriented x 3 today.   HEENT: normocephalic, atraumatic; sclera clear, conjunctiva pink; hearing intact; oropharynx clear; neck supple, no JVP Lymph- no cervical lymphadenopathy Lungs- Clear to ausculation bilaterally, normal work of breathing.  No wheezes, rales, rhonchi Heart- Regular rate and rhythm, no murmurs, rubs or gallops, PMI not laterally displaced GI- soft, non-tender, non-distended, bowel sounds present, no hepatosplenomegaly Extremities- no clubbing, cyanosis, or edema; DP/PT/radial pulses 2+ bilaterally MS- no significant  deformity or atrophy Skin- warm and dry, no rash or lesion  Psych- euthymic mood, full affect Neuro- strength and sensation are intact   EKG:  EKG is not ordered today.  Recent Labs: 03/09/2018: B Natriuretic Peptide 36.8 08/17/2018: TSH 1.52 09/18/2018: ALT 18 10/06/2018: NT-Pro BNP 352 12/07/2018: BUN 9; Creatinine, Ser 1.14; Hemoglobin 15.7; Platelets 199; Potassium 3.7; Sodium 141    Other studies Reviewed: Additional studies/ records that were reviewed today include: prior office notes   Assessment and Plan:  1.  Paroxysmal atrial  fibrillation Maintaining SR by symptoms Continue Xarelto for CHADS2VASC of 2 CBC, BMET today  2.  OSA Followed by pulmonary  3.  HTN He has been referred to HTN clinic ***   Current medicines are reviewed at length with the patient today.   The patient does not have concerns regarding his medicines.  The following changes were made today:  none  Labs/ tests ordered today include: CBC, BMET No orders of the defined types were placed in this encounter.    Disposition:   Follow up with me or Renee in 6 months     Signed, Chanetta Marshall, NP 01/25/2019 8:00 PM   [place] [address] [phone] (office) [phone] (fax)

## 2019-01-26 ENCOUNTER — Ambulatory Visit: Payer: BLUE CROSS/BLUE SHIELD | Admitting: Nurse Practitioner

## 2019-01-31 ENCOUNTER — Ambulatory Visit: Payer: BLUE CROSS/BLUE SHIELD

## 2019-02-17 ENCOUNTER — Telehealth: Payer: Self-pay | Admitting: Endocrinology

## 2019-02-17 ENCOUNTER — Ambulatory Visit: Payer: BLUE CROSS/BLUE SHIELD | Admitting: Endocrinology

## 2019-02-17 NOTE — Telephone Encounter (Signed)
error 

## 2019-02-17 NOTE — Telephone Encounter (Signed)
Please schedule f/u appt for next available appointment  

## 2019-02-17 NOTE — Telephone Encounter (Signed)
Rescheduled missed appointment for 03/03/19 at 10:45 a.m.

## 2019-02-17 NOTE — Telephone Encounter (Signed)
Patient no showed today's appt. Please advise on how to follow up. °A. No follow up necessary. °B. Follow up urgent. Contact patient immediately. °C. Follow up necessary. Contact patient and schedule visit in ___ days. °D. Follow up advised. Contact patient and schedule visit in ____weeks. ° °Would you like the NS fee to be applied to this visit? ° °

## 2019-02-17 NOTE — Telephone Encounter (Signed)
Please refer to Dr. Ellison's response 

## 2019-03-03 ENCOUNTER — Telehealth: Payer: Self-pay | Admitting: Endocrinology

## 2019-03-03 ENCOUNTER — Ambulatory Visit: Payer: BLUE CROSS/BLUE SHIELD | Admitting: Endocrinology

## 2019-03-03 NOTE — Telephone Encounter (Signed)
Patient no showed today's appt. Please advise on how to follow up. °A. No follow up necessary. °B. Follow up urgent. Contact patient immediately. °C. Follow up necessary. Contact patient and schedule visit in ___ days. °D. Follow up advised. Contact patient and schedule visit in ____weeks. ° °Would you like the NS fee to be applied to this visit? ° °

## 2019-03-03 NOTE — Telephone Encounter (Signed)
Rescheduled missed appointment for 03/10/19 at 4:00 p.m.

## 2019-03-03 NOTE — Telephone Encounter (Signed)
Please refer to Dr. Ellison's response 

## 2019-03-03 NOTE — Telephone Encounter (Signed)
Please schedule f/u appt for next available appointment  

## 2019-03-09 ENCOUNTER — Other Ambulatory Visit: Payer: Self-pay | Admitting: Internal Medicine

## 2019-03-10 ENCOUNTER — Ambulatory Visit: Payer: BLUE CROSS/BLUE SHIELD | Admitting: Endocrinology

## 2019-03-16 ENCOUNTER — Ambulatory Visit: Payer: BLUE CROSS/BLUE SHIELD | Admitting: Endocrinology

## 2019-03-21 ENCOUNTER — Other Ambulatory Visit: Payer: Self-pay | Admitting: Endocrinology

## 2019-03-22 ENCOUNTER — Other Ambulatory Visit: Payer: Self-pay

## 2019-03-23 ENCOUNTER — Ambulatory Visit (INDEPENDENT_AMBULATORY_CARE_PROVIDER_SITE_OTHER): Payer: Self-pay | Admitting: Endocrinology

## 2019-03-23 ENCOUNTER — Other Ambulatory Visit: Payer: Self-pay

## 2019-03-23 ENCOUNTER — Encounter: Payer: Self-pay | Admitting: Endocrinology

## 2019-03-23 VITALS — BP 160/92 | HR 64 | Temp 98.4°F | Resp 14 | Ht 72.0 in | Wt 283.0 lb

## 2019-03-23 DIAGNOSIS — Z0000 Encounter for general adult medical examination without abnormal findings: Secondary | ICD-10-CM

## 2019-03-23 DIAGNOSIS — E0590 Thyrotoxicosis, unspecified without thyrotoxic crisis or storm: Secondary | ICD-10-CM

## 2019-03-23 DIAGNOSIS — E119 Type 2 diabetes mellitus without complications: Secondary | ICD-10-CM

## 2019-03-23 MED ORDER — DAPAGLIFLOZIN PROPANEDIOL 5 MG PO TABS: 5.0000 mg | ORAL_TABLET | Freq: Every day | ORAL | 11 refills | Status: DC

## 2019-03-23 MED ORDER — METHIMAZOLE 10 MG PO TABS: 10.0000 mg | ORAL_TABLET | Freq: Every day | ORAL | 3 refills | Status: DC

## 2019-03-23 NOTE — Patient Instructions (Addendum)
I have sent a prescription to your pharmacy, to add "Farxiga." Please do blood tests next week.  Your blood pressure is high today.  Please see a new primary care provider soon, to have it rechecked.  check your blood sugar once a day.  vary the time of day when you check, between before the 3 meals, and at bedtime.  also check if you have symptoms of your blood sugar being too high or too low.  please keep a record of the readings and bring it to your next appointment here (or you can bring the meter itself).  You can write it on any piece of paper.  please call us sooner if your blood sugar goes below 70, or if you have a lot of readings over 200.  Please come back for a follow-up appointment in 3 months.

## 2019-03-23 NOTE — Progress Notes (Signed)
Subjective:    Patient ID: Spencer English, male    DOB: 1966/12/18, 53 y.o.   MRN: [id number]  HPI  Pt returns for f/u of diabetes mellitus: DM type: 2 Dx'ed: 7628 Complications: PAD (seen on CT) Therapy: januvia DKA: never Severe hypoglycemia: never Pancreatitis: never Pancreatic imaging: normal on 2019 CT Other: he did not tolerate metformin-XR (nausea).   Interval history: he tolerates [place] well Pt also returns for f/u of hyperthyroidism Past Medical History:  Diagnosis Date  . ALLERGIC RHINITIS 04/12/2009  . Atrial fibrillation ([place])   . BACK PAIN, LUMBAR 01/14/2011  . DEEP VENOUS THROMBOPHLEBITIS, LEG, LEFT 05/05/2009  . DM 08/08/2008  . DYSLIPIDEMIA 04/26/2009  . ECZEMA 05/23/2010  . 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 GERD (gastroesophageal reflux disease)   . GOUT 10/24/2010  . Hyperglycemia   . HYPERTENSION 07/21/2007  . 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 Leukopenia   . Long term (current) use of anticoagulants 04/03/2011  . PULMONARY EMBOLISM 05/03/2009  . UNSPECIFIED URINARY CALCULUS 07/04/2010    Past Surgical History:  Procedure Laterality Date  . ANTERIOR CRUCIATE LIGAMENT REPAIR  2000   Right  . CYSTECTOMY     back of head  . KNEE ARTHROSCOPY     left knee  . KNEE ARTHROSCOPY  01/03/2013   Procedure: ARTHROSCOPY KNEE;  Surgeon: Yvette Rack., MD;  Location: [facility];  Service: Orthopedics;  Laterality: Left;  Lavage Synovectomy, Removal of loose body  . KNEE ARTHROSCOPY W/ ACL RECONSTRUCTION     right knee    Social History   Socioeconomic History  . Marital status: Married    Spouse name: Not on file  . Number of children: 3  . Years of education: Not on file  . Highest education level: Not on file  Occupational History  . Occupation: Truck Education administrator: [employer]    Employer: [employer]  Social Needs  . Financial resource strain: Not on file  . Food insecurity:    Worry: Not on file    Inability: Not on file  .  Transportation needs:    Medical: Not on file    Non-medical: Not on file  Tobacco Use  . Smoking status: Former Smoker    Types: Cigars    Last attempt to quit: 07/29/2018    Years since quitting: 0.6  . Smokeless tobacco: Never Used  Substance and Sexual Activity  . Alcohol use: Yes    Comment: Beer everyother weekend   . Drug use: No  . Sexual activity: Not on file  Lifestyle  . Physical activity:    Days per week: Not on file    Minutes per session: Not on file  . Stress: Not on file  Relationships  . Social connections:    Talks on phone: Not on file    Gets together: Not on file    Attends religious service: Not on file    Active member of club or organization: Not on file    Attends meetings of clubs or organizations: Not on file    Relationship status: Not on file  . Intimate partner violence:    Fear of current or ex partner: Not on file    Emotionally abused: Not on file    Physically abused: Not on file    Forced sexual activity: Not on file  Other Topics Concern  . Not on file  Social History Narrative  . Not on file  Current Outpatient Medications on File Prior to Visit  Medication Sig Dispense Refill  . allopurinol (ZYLOPRIM) 300 MG tablet Take 300 mg by mouth as needed (gout flare up).    Marland Kitchen atorvastatin (LIPITOR) 20 MG tablet Take 1 tablet (20 mg total) by mouth daily. 90 tablet 3  . fluticasone (FLONASE) 50 MCG/ACT nasal spray Place 1 spray into both nostrils daily. allergies    . furosemide (LASIX) 40 MG tablet TAKE 1 TABLET BY MOUTH EVERY DAY 30 tablet 2  . halobetasol (ULTRAVATE) 0.05 % cream APPLY CREAM TOPICALLY THREE TIMES DAILY AS NEEDED FOR RASH 15 g 0  . losartan (COZAAR) 100 MG tablet TAKE 1 TABLET BY MOUTH ONCE DAILY 30 tablet 11  . metoprolol succinate (TOPROL-XL) 25 MG 24 hr tablet Take 1.5 tablets (37.5 mg total) by mouth daily. 45 tablet 3  . ondansetron (ZOFRAN ODT) 4 MG disintegrating tablet Take 1 tablet (4 mg total) by mouth every 6  (six) hours as needed for nausea or vomiting. 20 tablet 0  . sitaGLIPtin (JANUVIA) 100 MG tablet Take 1 tablet (100 mg total) by mouth daily. 90 tablet 3  . spironolactone (ALDACTONE) 25 MG tablet Take 1 tablet (25 mg total) by mouth daily. 30 tablet 11  . Verapamil HCl CR 300 MG CP24 Take 1 capsule (300 mg total) by mouth daily. 30 capsule 2  . XARELTO 20 MG TABS tablet TAKE 1 TABLET(20 MG) BY MOUTH DAILY WITH SUPPER 90 tablet 0   No current facility-administered medications on file prior to visit.     Allergies  Allergen Reactions  . Metformin Diarrhea and Nausea Only  . Viagra [Sildenafil Citrate] Other (See Comments)    headache    Family History  Problem Relation Age of Onset  . Diabetes Mother   . Hypertension Mother   . Hyperlipidemia Mother   . Diabetes Father   . Hypertension Father   . Hyperlipidemia Father   . Heart attack Father   . Heart disease Father     BP (!) 160/92 (BP Location: Left Arm, Patient Position: Sitting, Cuff Size: Small)   Pulse 64   Temp 98.4 F (36.9 C)   Resp 14   Ht 6' (1.829 m)   Wt 283 lb (128.4 kg)   SpO2 98%   BMI 38.38 kg/m   Review of Systems He has regained weight.      Objective:   Physical Exam VITAL SIGNS:  See vs page GENERAL: no distress Pulses: dorsalis pedis intact bilat.   MSK: no deformity of the feet CV: 1+ bilat leg edema, and bilat vv's.   Skin:  no ulcer on the feet.  normal color and temp on the feet.  Neuro: sensation is intact to touch on the feet.     Lab Results  Component Value Date   CREATININE 1.14 12/07/2018   BUN 9 12/07/2018   NA 141 12/07/2018   K 3.7 12/07/2018   CL 103 12/07/2018   CO2 26 12/07/2018   A1c=7.4%    Assessment & Plan:  Type 2 DM, with PAD: worse HTN: is noted today Edema: This limits rx options.  Patient Instructions  I have sent a prescription to your pharmacy, to add "Farxiga." Please do blood tests next week.  Your blood pressure is high today.  Please see a new  primary care provider soon, to have it rechecked.  check your blood sugar once a day.  vary the time of day when you check, between before the  3 meals, and at bedtime.  also check if you have symptoms of your blood sugar being too high or too low.  please keep a record of the readings and bring it to your next appointment here (or you can bring the meter itself).  You can write it on any piece of paper.  please call us sooner if your blood sugar goes below 70, or if you have a lot of readings over 200.  Please come back for a follow-up appointment in 3 months.

## 2019-04-05 DIAGNOSIS — M25562 Pain in left knee: Secondary | ICD-10-CM | POA: Diagnosis not present

## 2019-04-06 ENCOUNTER — Telehealth: Payer: Self-pay | Admitting: Endocrinology

## 2019-04-06 NOTE — Telephone Encounter (Signed)
Called pt and informed Dr. Loanne Drilling has denied this request with his pharmacy early today. Pt advised this medication was previously filled AND that he had been advised that no future refills could be authorized d/t him needing to establish care with a PCP. Advised ample time had been provided for him to establish care. Reminded he ensure he establishes care with a PCP to ensure there are no further gaps in his care. Pt expressed hostility then immediately disconnected the call.

## 2019-04-06 NOTE — Telephone Encounter (Signed)
MEDICATION: Verapamil HCl CR 300 MG CP24  PHARMACY:  [facility] [phone]  IS THIS A 90 DAY SUPPLY :   IS PATIENT OUT OF MEDICATION: Yes  IF NOT; HOW MUCH IS LEFT:   LAST APPOINTMENT DATE: @3 /25/2020  NEXT APPOINTMENT DATE:@Visit  date not found  DO WE HAVE YOUR PERMISSION TO LEAVE A DETAILED MESSAGE:  OTHER COMMENTS:  Patient states that his insurance does not cover 347mcg and needs a new dosage.  **Let patient know to contact pharmacy at the end of the day to make sure medication is ready. **  ** Please notify patient to allow 48-72 hours to process**  **Encourage patient to contact the pharmacy for refills or they can request refills through [facility]**

## 2019-05-04 DIAGNOSIS — I480 Paroxysmal atrial fibrillation: Secondary | ICD-10-CM | POA: Diagnosis not present

## 2019-05-04 DIAGNOSIS — Z86711 Personal history of pulmonary embolism: Secondary | ICD-10-CM | POA: Diagnosis not present

## 2019-05-04 DIAGNOSIS — E119 Type 2 diabetes mellitus without complications: Secondary | ICD-10-CM | POA: Diagnosis not present

## 2019-05-04 DIAGNOSIS — Z7901 Long term (current) use of anticoagulants: Secondary | ICD-10-CM | POA: Diagnosis not present

## 2019-05-04 DIAGNOSIS — Z1331 Encounter for screening for depression: Secondary | ICD-10-CM | POA: Diagnosis not present

## 2019-05-12 DIAGNOSIS — E7800 Pure hypercholesterolemia, unspecified: Secondary | ICD-10-CM | POA: Diagnosis not present

## 2019-05-12 DIAGNOSIS — E790 Hyperuricemia without signs of inflammatory arthritis and tophaceous disease: Secondary | ICD-10-CM | POA: Diagnosis not present

## 2019-05-12 DIAGNOSIS — I10 Essential (primary) hypertension: Secondary | ICD-10-CM | POA: Diagnosis not present

## 2019-05-12 DIAGNOSIS — E119 Type 2 diabetes mellitus without complications: Secondary | ICD-10-CM | POA: Diagnosis not present

## 2019-05-12 DIAGNOSIS — R82998 Other abnormal findings in urine: Secondary | ICD-10-CM | POA: Diagnosis not present

## 2019-05-12 DIAGNOSIS — E0590 Thyrotoxicosis, unspecified without thyrotoxic crisis or storm: Secondary | ICD-10-CM | POA: Diagnosis not present

## 2019-05-25 ENCOUNTER — Other Ambulatory Visit: Payer: Self-pay | Admitting: Endocrinology

## 2019-06-02 ENCOUNTER — Ambulatory Visit: Payer: BLUE CROSS/BLUE SHIELD | Admitting: Pulmonary Disease

## 2019-06-02 NOTE — Progress Notes (Deleted)
@Patient  ID: Spencer English, male    DOB: [birth year], 53 y.o.   MRN: [id number]  No chief complaint on file.   Referring provider: Billie Ruddy, MD  HPI:   PMH:  Smoker/ Smoking History:  Maintenance:   Pt of:   06/02/2019  - Visit   HPI  Tests:   PSG 07/03/14 >> AHI 33.6, SaO2 low 76%. CPAP 8 cm H2O   Spirometry 09/2018> no airway obstruction, ratio 80, FEV1 74%, FVC 73% consistent with mild restriction  Echo 09/2018 normal LV function normal RVSP  CT angiogram 09/2018- for pulmonary embolism, s/o pulmonary retention  FENO:  No results found for: NITRICOXIDE  PFT: No flowsheet data found.  Imaging: No results found.    Specialty Problems      Pulmonary Problems   Allergic rhinitis    Qualifier: Diagnosis of  By: Loanne Drilling MD, Hilliard Clark A       Dyspnea    Qualifier: Diagnosis of  By: Jenny Reichmann MD, Hunt Oris       Pulmonary edema   OSA on CPAP    Severe, AHI 33 >> CPAP 8 cm         Allergies  Allergen Reactions  . Metformin Diarrhea and Nausea Only  . Viagra [Sildenafil Citrate] Other (See Comments)    headache    Immunization History  Administered Date(s) Administered  . Hepatitis B, adult 06/02/2017, 07/07/2017, 12/30/2017  . Influenza Split 09/06/2012  . Influenza Whole 11/10/2008, 09/24/2009, 10/24/2010  . Influenza,inj,Quad PF,6+ Mos 10/10/2013, 10/20/2014, 09/05/2015, 03/09/2017, 09/15/2018  . Td 12/30/2007    Past Medical History:  Diagnosis Date  . ALLERGIC RHINITIS 04/12/2009  . Atrial fibrillation ([place])   . BACK PAIN, LUMBAR 01/14/2011  . DEEP VENOUS THROMBOPHLEBITIS, LEG, LEFT 05/05/2009  . DM 08/08/2008  . DYSLIPIDEMIA 04/26/2009  . ECZEMA 05/23/2010  . 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 GERD (gastroesophageal reflux disease)   . GOUT 10/24/2010  . Hyperglycemia   . HYPERTENSION 07/21/2007  . 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 Leukopenia   . Long term (current) use of anticoagulants 04/03/2011  . PULMONARY EMBOLISM 05/03/2009  .  UNSPECIFIED URINARY CALCULUS 07/04/2010    Tobacco History: Social History   Tobacco Use  Smoking Status Former Smoker  . Types: Cigars  . Last attempt to quit: 07/29/2018  . Years since quitting: 0.8  Smokeless Tobacco Never Used   Counseling given: Not Answered   Continue to not smoke  Outpatient Encounter Medications as of 06/02/2019  Medication Sig  . allopurinol (ZYLOPRIM) 300 MG tablet Take 300 mg by mouth as needed (gout flare up).  Marland Kitchen atorvastatin (LIPITOR) 20 MG tablet Take 1 tablet (20 mg total) by mouth daily.  . dapagliflozin propanediol (FARXIGA) 5 MG TABS tablet Take 5 mg by mouth daily.  . fluticasone (FLONASE) 50 MCG/ACT nasal spray Place 1 spray into both nostrils daily. allergies  . furosemide (LASIX) 40 MG tablet TAKE 1 TABLET BY MOUTH EVERY DAY  . halobetasol (ULTRAVATE) 0.05 % cream APPLY CREAM TOPICALLY THREE TIMES DAILY AS NEEDED FOR RASH  . losartan (COZAAR) 100 MG tablet TAKE 1 TABLET BY MOUTH ONCE DAILY  . methimazole (TAPAZOLE) 10 MG tablet Take 1 tablet (10 mg total) by mouth daily.  . metoprolol succinate (TOPROL-XL) 25 MG 24 hr tablet Take 1.5 tablets (37.5 mg total) by mouth daily.  . ondansetron (ZOFRAN ODT) 4 MG disintegrating tablet Take 1 tablet (4 mg total) by mouth every 6 (six) hours as needed for nausea or  vomiting.  . sitaGLIPtin (JANUVIA) 100 MG tablet Take 1 tablet (100 mg total) by mouth daily.  Marland Kitchen spironolactone (ALDACTONE) 25 MG tablet Take 1 tablet (25 mg total) by mouth daily.  . Verapamil HCl CR 300 MG CP24 Take 1 capsule (300 mg total) by mouth daily.  Alveda Reasons 20 MG TABS tablet TAKE 1 TABLET(20 MG) BY MOUTH DAILY WITH SUPPER   No facility-administered encounter medications on file as of 06/02/2019.      Review of Systems  Review of Systems   Physical Exam  There were no vitals taken for this visit.  Wt Readings from Last 5 Encounters:  03/23/19 283 lb (128.4 kg)  12/17/18 270 lb (122.5 kg)  12/07/18 265 lb (120.2 kg)   10/21/18 263 lb (119.3 kg)  10/06/18 273 lb 12.8 oz (124.2 kg)     Physical Exam    Lab Results:  CBC    Component Value Date/Time   WBC 4.3 12/07/2018 2016   RBC 5.63 12/07/2018 2016   HGB 15.7 12/07/2018 2016   HCT 50.5 12/07/2018 2016   PLT 199 12/07/2018 2016   MCV 89.7 12/07/2018 2016   MCH 27.9 12/07/2018 2016   MCHC 31.1 12/07/2018 2016   RDW 16.2 (H) 12/07/2018 2016   LYMPHSABS 1.2 09/18/2018 1112   MONOABS 0.5 09/18/2018 1112   EOSABS 0.3 09/18/2018 1112   BASOSABS 0.0 09/18/2018 1112    BMET    Component Value Date/Time   NA 141 12/07/2018 2016   NA 141 10/06/2018 1449   K 3.7 12/07/2018 2016   CL 103 12/07/2018 2016   CO2 26 12/07/2018 2016   GLUCOSE 123 (H) 12/07/2018 2016   GLUCOSE 112 (H) 10/30/2006 1637   BUN 9 12/07/2018 2016   BUN 7 10/06/2018 1449   CREATININE 1.14 12/07/2018 2016   CREATININE 1.12 02/18/2013 1859   CALCIUM 8.7 (L) 12/07/2018 2016   GFRNONAA >60 12/07/2018 2016   GFRAA >60 12/07/2018 2016    BNP    Component Value Date/Time   BNP 36.8 03/09/2018 0036    ProBNP    Component Value Date/Time   PROBNP 352 (H) 10/06/2018 1449   PROBNP 11,423.0 (H) 01/31/2013 1736      Assessment & Plan:   No problem-specific Assessment & Plan notes found for this encounter.    No follow-ups on file.   Lauraine Rinne, NP 06/02/2019   This appointment was *** minutes long with over 50% of the time in direct face-to-face patient care, assessment, plan of care, and follow-up.

## 2019-06-22 ENCOUNTER — Other Ambulatory Visit: Payer: Self-pay | Admitting: Family Medicine

## 2019-06-22 DIAGNOSIS — I4891 Unspecified atrial fibrillation: Secondary | ICD-10-CM

## 2019-06-22 DIAGNOSIS — I10 Essential (primary) hypertension: Secondary | ICD-10-CM

## 2019-06-27 DIAGNOSIS — I480 Paroxysmal atrial fibrillation: Secondary | ICD-10-CM | POA: Diagnosis not present

## 2019-06-27 DIAGNOSIS — I10 Essential (primary) hypertension: Secondary | ICD-10-CM | POA: Diagnosis not present

## 2019-06-27 DIAGNOSIS — N63 Unspecified lump in unspecified breast: Secondary | ICD-10-CM | POA: Diagnosis not present

## 2019-06-27 DIAGNOSIS — N6002 Solitary cyst of left breast: Secondary | ICD-10-CM | POA: Diagnosis not present

## 2019-06-28 ENCOUNTER — Other Ambulatory Visit: Payer: Self-pay | Admitting: Internal Medicine

## 2019-06-28 DIAGNOSIS — N63 Unspecified lump in unspecified breast: Secondary | ICD-10-CM

## 2019-06-28 DIAGNOSIS — N6002 Solitary cyst of left breast: Secondary | ICD-10-CM

## 2019-07-07 ENCOUNTER — Ambulatory Visit
Admission: RE | Admit: 2019-07-07 | Discharge: 2019-07-07 | Disposition: A | Discharge status: Home / Self Care | Payer: BC Managed Care – PPO | Source: Ambulatory Visit | Attending: Internal Medicine | Admitting: Internal Medicine

## 2019-07-07 ENCOUNTER — Ambulatory Visit
Admission: RE | Admit: 2019-07-07 | Discharge: 2019-07-07 | Disposition: A | Discharge status: Home / Self Care | Payer: BLUE CROSS/BLUE SHIELD | Source: Ambulatory Visit | Attending: Internal Medicine | Admitting: Internal Medicine

## 2019-07-07 ENCOUNTER — Other Ambulatory Visit: Payer: Self-pay

## 2019-07-07 DIAGNOSIS — N63 Unspecified lump in unspecified breast: Secondary | ICD-10-CM

## 2019-07-07 DIAGNOSIS — R928 Other abnormal and inconclusive findings on diagnostic imaging of breast: Secondary | ICD-10-CM | POA: Diagnosis not present

## 2019-07-07 DIAGNOSIS — N62 Hypertrophy of breast: Secondary | ICD-10-CM | POA: Diagnosis not present

## 2019-07-07 DIAGNOSIS — N6002 Solitary cyst of left breast: Secondary | ICD-10-CM

## 2019-08-12 DIAGNOSIS — N62 Hypertrophy of breast: Secondary | ICD-10-CM | POA: Diagnosis not present

## 2019-08-12 DIAGNOSIS — I10 Essential (primary) hypertension: Secondary | ICD-10-CM | POA: Diagnosis not present

## 2019-08-12 DIAGNOSIS — Z1331 Encounter for screening for depression: Secondary | ICD-10-CM | POA: Diagnosis not present

## 2019-08-12 DIAGNOSIS — E7800 Pure hypercholesterolemia, unspecified: Secondary | ICD-10-CM | POA: Diagnosis not present

## 2019-08-12 DIAGNOSIS — E119 Type 2 diabetes mellitus without complications: Secondary | ICD-10-CM | POA: Diagnosis not present

## 2019-08-12 DIAGNOSIS — Z7901 Long term (current) use of anticoagulants: Secondary | ICD-10-CM | POA: Diagnosis not present

## 2019-08-15 IMAGING — MG DIGITAL DIAGNOSTIC BILATERAL MAMMOGRAM WITH TOMO AND CAD
6 of 10 series · 6 of 30 positions shown · non-contrast
Comparison: None.

ACR Breast Density Category a: The breast tissue is almost entirely
fatty.

CLINICAL DATA: 53-year-old male presenting for evaluation of a
palpable lump in the lateral left breast for about 1 month. The
patient has history of surgery on this breast at about the age of 17
for benign tissue.

EXAM:
DIGITAL DIAGNOSTIC BILATERAL MAMMOGRAM WITH CAD AND TOMO
ULTRASOUND LEFT BREAST

[L CC synth-2D]
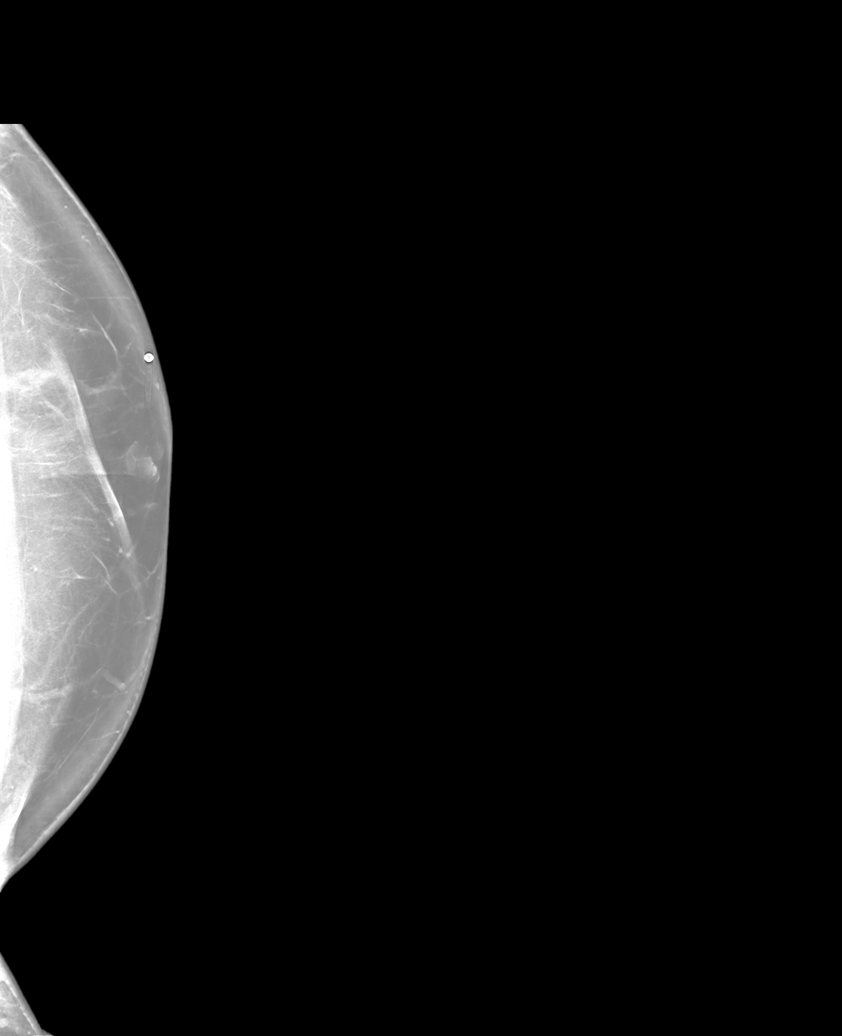

[R MLO synth-2D]
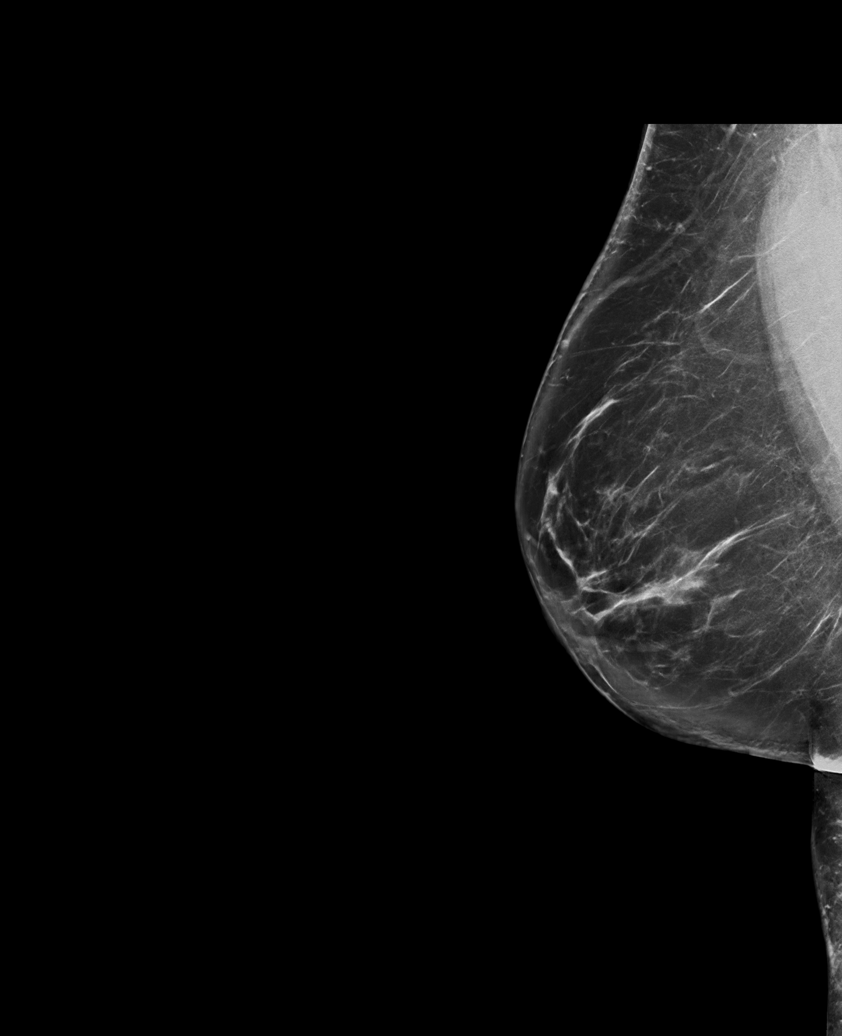

[L TAN synth-2D]
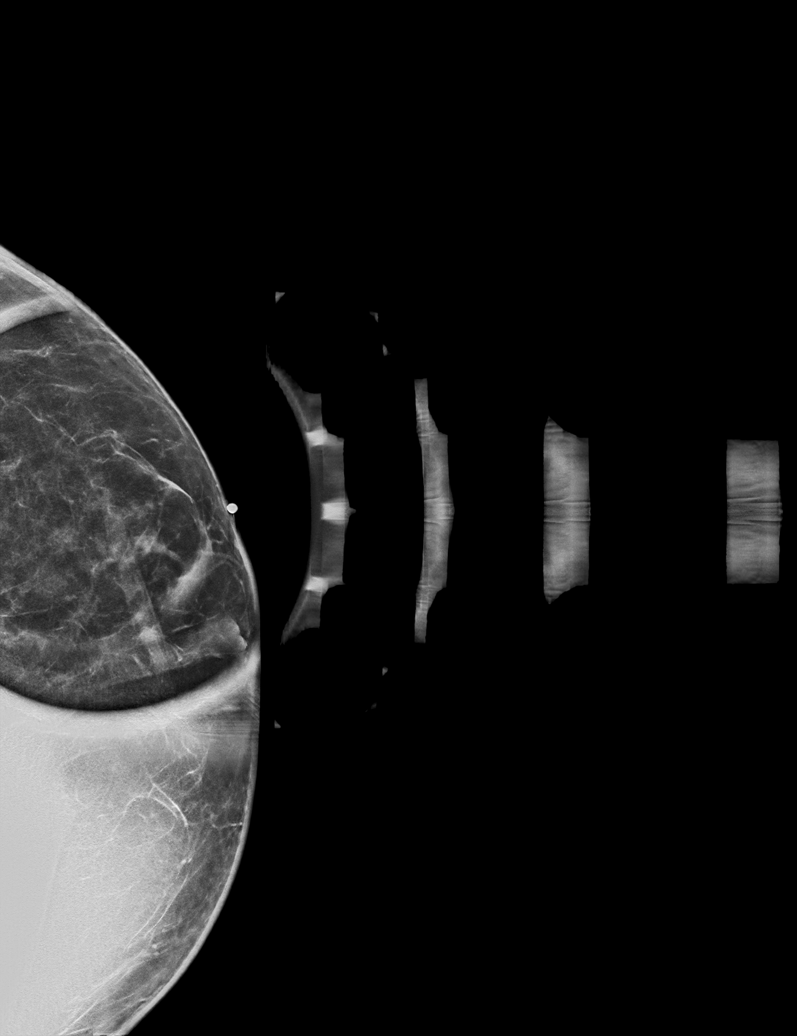

[L MLO synth-2D]
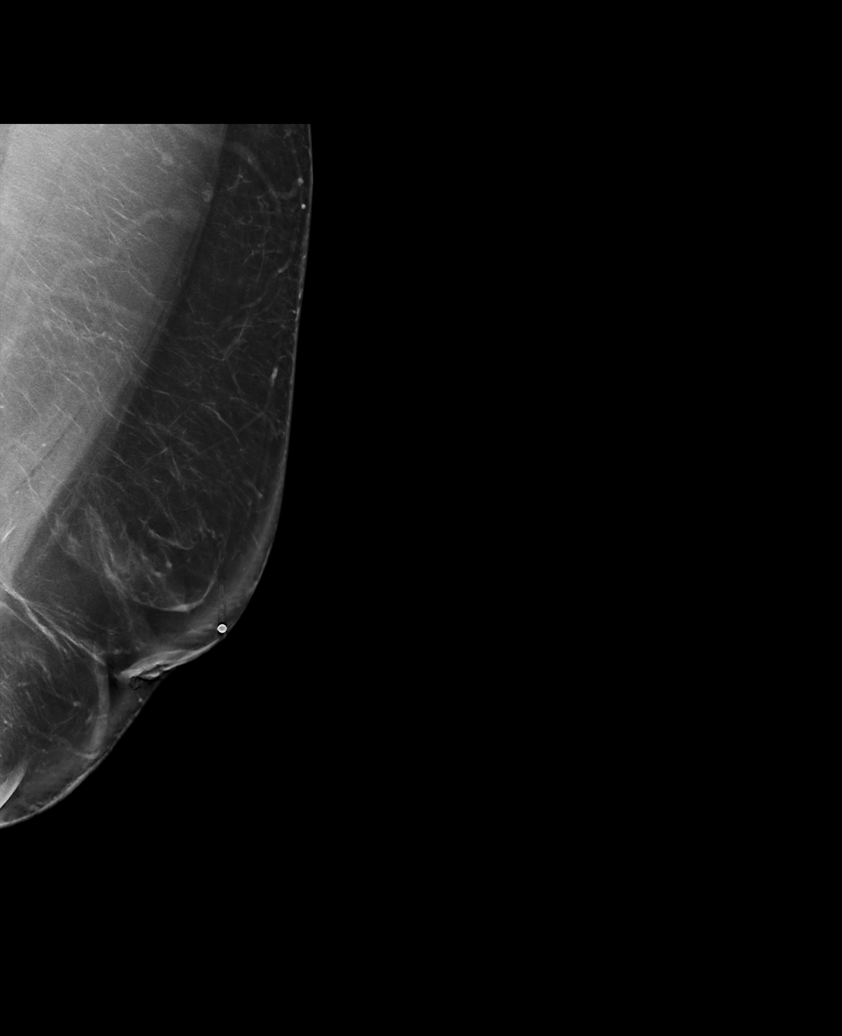

[R CC synth-2D]
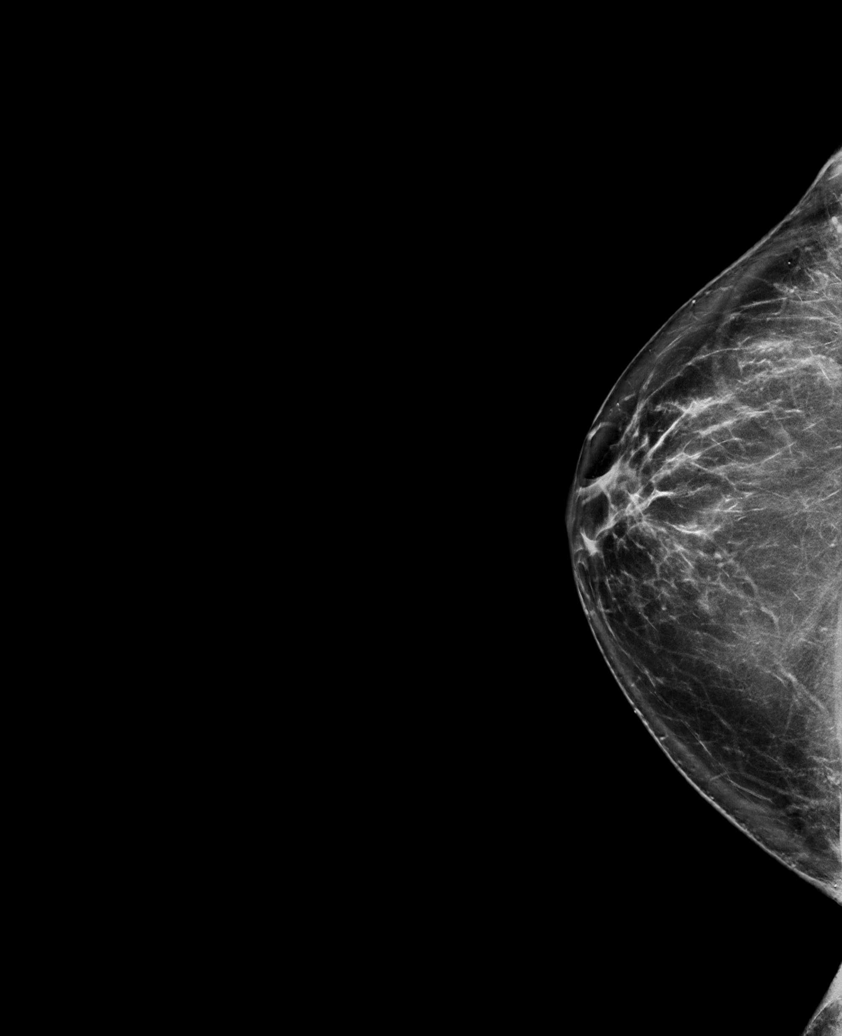

[R MLO tomo · tomo slice 51/100.0]
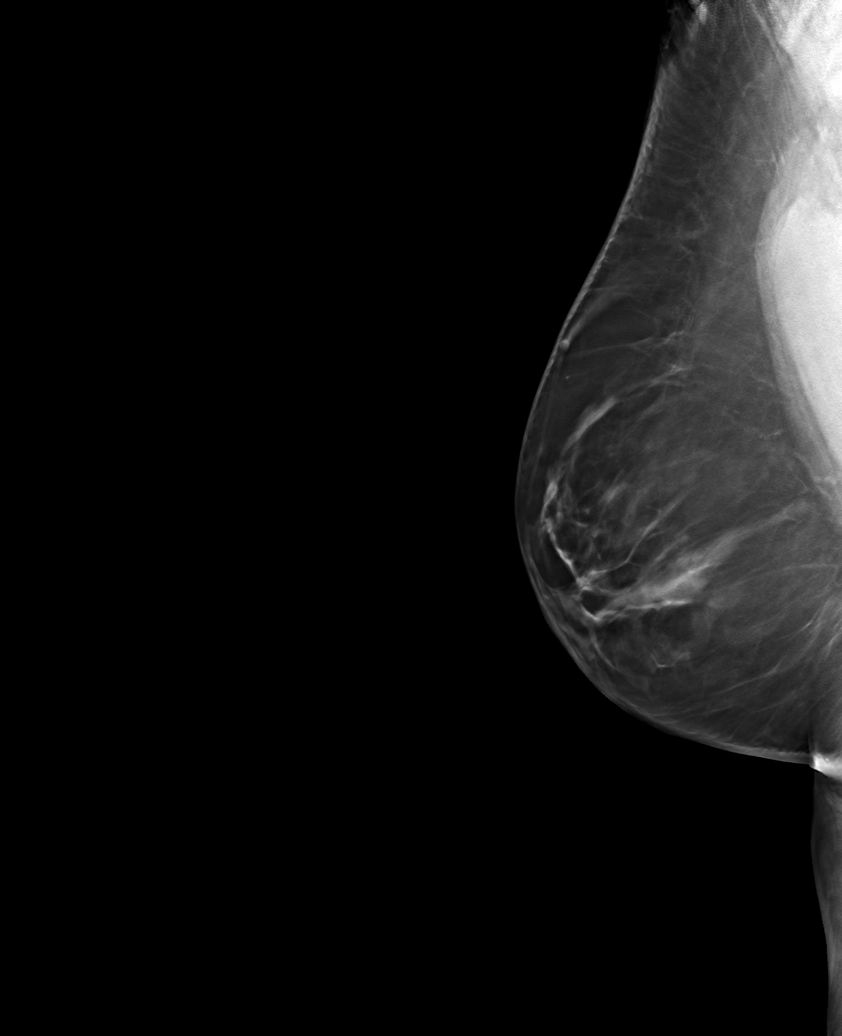

[6 of 30 positions shown; findings below may reference images not displayed]

FINDINGS: A BB has been placed on the lateral left breast indicating the
palpable site of concern. There is a small amount of what appears to
be benign fibroglandular tissue deep to the marker. No suspicious
masses are identified. There is surgical scarring just inferior to
the left nipple consistent with history of remote surgery. The right
breast demonstrates gynecomastia.

Mammographic images were processed with CAD.

On physical exam, there is a firm ridge of tissue in the upper-outer
quadrant of the left breast without discrete mass.

Targeted ultrasound is performed, showing a small amount of
fibroglandular tissue in the upper-outer quadrant of the left
breast. No suspicious masses or areas of shadowing are identified.
IMPRESSION: 1. The palpable area in the upper-outer quadrant of the left breast
corresponds with benign gynecomastia.

2.  No mammographic evidence of malignancy in the bilateral breasts.

RECOMMENDATION:
Clinical follow-up recommended for benign gynecomastia.

I have discussed the findings and recommendations with the patient.
Results were also provided in writing at the conclusion of the
visit. If applicable, a reminder letter will be sent to the patient
regarding the next appointment.

BI-RADS CATEGORY  2: Benign.

## 2019-08-29 ENCOUNTER — Other Ambulatory Visit: Payer: Self-pay | Admitting: Internal Medicine

## 2019-08-29 NOTE — Telephone Encounter (Signed)
Prescription refill request for Xarelto received.   Last office visit: Gerhardt (10-06-2018) Weight: 128.4 kg  Age: 53 y.o. Scr: 1.14 CrCl: 135 ml/min  Prescription refill sent.

## 2019-08-31 ENCOUNTER — Other Ambulatory Visit: Payer: Self-pay

## 2019-09-02 ENCOUNTER — Encounter: Payer: Self-pay | Admitting: Endocrinology

## 2019-09-02 ENCOUNTER — Ambulatory Visit (INDEPENDENT_AMBULATORY_CARE_PROVIDER_SITE_OTHER): Payer: BC Managed Care – PPO | Admitting: Endocrinology

## 2019-09-02 ENCOUNTER — Other Ambulatory Visit: Payer: Self-pay

## 2019-09-02 ENCOUNTER — Ambulatory Visit: Payer: BC Managed Care – PPO | Admitting: Endocrinology

## 2019-09-02 VITALS — BP 142/80 | HR 89 | Ht 72.0 in | Wt 277.2 lb

## 2019-09-02 DIAGNOSIS — I8002 Phlebitis and thrombophlebitis of superficial vessels of left lower extremity: Secondary | ICD-10-CM | POA: Diagnosis not present

## 2019-09-02 DIAGNOSIS — R600 Localized edema: Secondary | ICD-10-CM | POA: Diagnosis not present

## 2019-09-02 DIAGNOSIS — Z23 Encounter for immunization: Secondary | ICD-10-CM

## 2019-09-02 DIAGNOSIS — I8312 Varicose veins of left lower extremity with inflammation: Secondary | ICD-10-CM | POA: Diagnosis not present

## 2019-09-02 DIAGNOSIS — E0590 Thyrotoxicosis, unspecified without thyrotoxic crisis or storm: Secondary | ICD-10-CM | POA: Diagnosis not present

## 2019-09-02 DIAGNOSIS — E119 Type 2 diabetes mellitus without complications: Secondary | ICD-10-CM | POA: Diagnosis not present

## 2019-09-02 LAB — POCT GLYCOSYLATED HEMOGLOBIN (HGB A1C): Hemoglobin A1C: 7.2 % — AB (ref 4.0–5.6)

## 2019-09-02 MED ORDER — FARXIGA 5 MG PO TABS: 5.0000 mg | ORAL_TABLET | Freq: Every day | ORAL | 11 refills | Status: DC

## 2019-09-02 MED ORDER — CONTOUR NEXT TEST VI STRP: 1.0000 | ORAL_STRIP | Freq: Every day | 3 refills | Status: AC

## 2019-09-02 NOTE — Patient Instructions (Addendum)
Your blood pressure is high today.  Please see your primary care provider soon, to have it rechecked I have sent a prescription to your pharmacy, to add "Farxiga."  Blood tests are requested for you today.  We'll let you know about the results.  check your blood sugar once a day.  vary the time of day when you check, between before the 3 meals, and at bedtime.  also check if you have symptoms of your blood sugar being too high or too low.  please keep a record of the readings and bring it to your next appointment here (or you can bring the meter itself).  You can write it on any piece of paper.  please call us sooner if your blood sugar goes below 70, or if you have a lot of readings over 200.  Here is a new meter.  I have sent a prescription to your pharmacy, for strips.   Please come back for a follow-up appointment in 3 months

## 2019-09-02 NOTE — Progress Notes (Signed)
Subjective:    Patient ID: Spencer English, male    DOB: Sep 14, 1966, 53 y.o.   MRN: [id number]  HPI Pt returns for f/u of diabetes mellitus: DM type: 2 Dx'ed: [id number] Complications: PAD (seen on CT) Therapy: januvia DKA: never Severe hypoglycemia: never Pancreatitis: never Pancreatic imaging: normal on 2019 CT Other: he did not tolerate metformin-XR (nausea).   Interval history: he takes [place] as rx'ed, and he tolerates well Pt also returns for f/u of hyperthyroidism (dx'ed 2014; he chose tapazole rx; he has never had dedicated thyroid imaging, but CT of C-spine made no mention of the thyroid). Past Medical History:  Diagnosis Date  . ALLERGIC RHINITIS 04/12/2009  . Atrial fibrillation ([place])   . BACK PAIN, LUMBAR 01/14/2011  . DEEP VENOUS THROMBOPHLEBITIS, LEG, LEFT 05/05/2009  . DM 08/08/2008  . DYSLIPIDEMIA 04/26/2009  . ECZEMA 05/23/2010  . 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 GERD (gastroesophageal reflux disease)   . GOUT 10/24/2010  . Hyperglycemia   . HYPERTENSION 07/21/2007  . 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 Leukopenia   . Long term (current) use of anticoagulants 04/03/2011  . PULMONARY EMBOLISM 05/03/2009  . UNSPECIFIED URINARY CALCULUS 07/04/2010    Past Surgical History:  Procedure Laterality Date  . ANTERIOR CRUCIATE LIGAMENT REPAIR  2000   Right  . CYSTECTOMY     back of head  . KNEE ARTHROSCOPY     left knee  . KNEE ARTHROSCOPY  01/03/2013   Procedure: ARTHROSCOPY KNEE;  Surgeon: Yvette Rack., MD;  Location: [place];  Service: Orthopedics;  Laterality: Left;  Lavage Synovectomy, Removal of loose body  . KNEE ARTHROSCOPY W/ ACL RECONSTRUCTION     right knee    Social History   Socioeconomic History  . Marital status: Married    Spouse name: Not on file  . Number of children: 3  . Years of education: Not on file  . Highest education level: Not on file  Occupational History  . 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 Financial resource strain: Not on file  . Food insecurity    Worry: Not on file    Inability: Not on file  . Transportation needs    Medical: Not on file    Non-medical: Not on file  Tobacco Use  . Smoking status: Former Smoker    Types: Cigars    Quit date: 07/29/2018    Years since quitting: 1.0  . Smokeless tobacco: Never Used  Substance and Sexual Activity  . Alcohol use: Yes    Comment: Beer everyother weekend   . Drug use: No  . Sexual activity: Not on file  Lifestyle  . Physical activity    Days per week: Not on file    Minutes per session: Not on file  . Stress: Not on file  Relationships  . Social Herbalist on phone: Not on file    Gets together: Not on file    Attends religious service: Not on file    Active member of club or organization: Not on file    Attends meetings of clubs or organizations: Not on file    Relationship status: Not on file  . Intimate partner violence    Fear of current or ex partner: Not on file    Emotionally abused: Not on file    Physically abused: Not on file    Forced sexual  activity: Not on file  Other Topics Concern  . Not on file  Social History Narrative  . Not on file    Current Outpatient Medications on File Prior to Visit  Medication Sig Dispense Refill  . allopurinol (ZYLOPRIM) 300 MG tablet Take 300 mg by mouth as needed (gout flare up).    Marland Kitchen atorvastatin (LIPITOR) 20 MG tablet Take 1 tablet (20 mg total) by mouth daily. 90 tablet 3  . fluticasone (FLONASE) 50 MCG/ACT nasal spray Place 1 spray into both nostrils daily. allergies    . furosemide (LASIX) 40 MG tablet TAKE 1 TABLET BY MOUTH EVERY DAY 30 tablet 2  . halobetasol (ULTRAVATE) 0.05 % cream APPLY CREAM TOPICALLY THREE TIMES DAILY AS NEEDED FOR RASH 15 g 0  . losartan (COZAAR) 100 MG tablet TAKE 1 TABLET BY MOUTH ONCE DAILY 30 tablet 11  . methimazole (TAPAZOLE) 10 MG tablet Take 1 tablet (10 mg total) by mouth daily. 90  tablet 3  . metoprolol succinate (TOPROL-XL) 25 MG 24 hr tablet TAKE 1 AND 1/2 TABLETS(37.5 MG) BY MOUTH DAILY 45 tablet 3  . ondansetron (ZOFRAN ODT) 4 MG disintegrating tablet Take 1 tablet (4 mg total) by mouth every 6 (six) hours as needed for nausea or vomiting. 20 tablet 0  . sitaGLIPtin (JANUVIA) 100 MG tablet Take 1 tablet (100 mg total) by mouth daily. 90 tablet 3  . spironolactone (ALDACTONE) 25 MG tablet Take 1 tablet (25 mg total) by mouth daily. 30 tablet 11  . Verapamil HCl CR 300 MG CP24 Take 1 capsule (300 mg total) by mouth daily. 30 capsule 2  . XARELTO 20 MG TABS tablet TAKE 1 TABLET(20 MG) BY MOUTH DAILY WITH SUPPER 90 tablet 1   No current facility-administered medications on file prior to visit.     Allergies  Allergen Reactions  . Metformin Diarrhea and Nausea Only  . Viagra [Sildenafil Citrate] Other (See Comments)    headache    Family History  Problem Relation Age of Onset  . Diabetes Mother   . Hypertension Mother   . Hyperlipidemia Mother   . Diabetes Father   . Hypertension Father   . Hyperlipidemia Father   . Heart attack Father   . Heart disease Father     BP (!) 142/80 (BP Location: Left Arm, Patient Position: Sitting, Cuff Size: Large)   Pulse 89   Ht 6' (1.829 m)   Wt 277 lb 3.2 oz (125.7 kg)   SpO2 98%   BMI 37.60 kg/m   Review of Systems Denies fever    Objective:   Physical Exam VITAL SIGNS:  See vs page GENERAL: no distress Pulses: dorsalis pedis intact bilat.   MSK: no deformity of the feet CV: 1+ bilat leg edema, and bilat vv's.   Skin:  no ulcer on the feet.  normal color and temp on the feet.  Neuro: sensation is intact to touch on the feet Ext: there is bilateral onychomycosis of the toenails.   Lab Results  Component Value Date   HGBA1C 7.2 (A) 09/02/2019   Lab Results  Component Value Date   TSH 1.52 08/17/2018      Assessment & Plan:  HTN: is noted today Type 2 DM, with PAD: he needs increased rx Edema: This  limits rx options Hyperthyroidism: well-controlled.  Please continue the same medication.    Patient Instructions  Your blood pressure is high today.  Please see your primary care provider soon, to have it rechecked I  have sent a prescription to your pharmacy, to add "Farxiga."  Blood tests are requested for you today.  We'll let you know about the results.  check your blood sugar once a day.  vary the time of day when you check, between before the 3 meals, and at bedtime.  also check if you have symptoms of your blood sugar being too high or too low.  please keep a record of the readings and bring it to your next appointment here (or you can bring the meter itself).  You can write it on any piece of paper.  please call us sooner if your blood sugar goes below 70, or if you have a lot of readings over 200.  Here is a new meter.  I have sent a prescription to your pharmacy, for strips.   Please come back for a follow-up appointment in 3 months

## 2019-09-14 DIAGNOSIS — I8312 Varicose veins of left lower extremity with inflammation: Secondary | ICD-10-CM | POA: Diagnosis not present

## 2019-09-14 DIAGNOSIS — I8002 Phlebitis and thrombophlebitis of superficial vessels of left lower extremity: Secondary | ICD-10-CM | POA: Diagnosis not present

## 2019-09-19 DIAGNOSIS — E119 Type 2 diabetes mellitus without complications: Secondary | ICD-10-CM | POA: Diagnosis not present

## 2019-10-26 DIAGNOSIS — I8002 Phlebitis and thrombophlebitis of superficial vessels of left lower extremity: Secondary | ICD-10-CM | POA: Diagnosis not present

## 2019-10-26 DIAGNOSIS — I8312 Varicose veins of left lower extremity with inflammation: Secondary | ICD-10-CM | POA: Diagnosis not present

## 2019-11-25 DIAGNOSIS — E119 Type 2 diabetes mellitus without complications: Secondary | ICD-10-CM | POA: Diagnosis not present

## 2019-11-25 DIAGNOSIS — E7800 Pure hypercholesterolemia, unspecified: Secondary | ICD-10-CM | POA: Diagnosis not present

## 2019-11-25 DIAGNOSIS — E0590 Thyrotoxicosis, unspecified without thyrotoxic crisis or storm: Secondary | ICD-10-CM | POA: Diagnosis not present

## 2019-11-25 DIAGNOSIS — I10 Essential (primary) hypertension: Secondary | ICD-10-CM | POA: Diagnosis not present

## 2019-12-01 DIAGNOSIS — Z0000 Encounter for general adult medical examination without abnormal findings: Secondary | ICD-10-CM | POA: Diagnosis not present

## 2019-12-01 DIAGNOSIS — I10 Essential (primary) hypertension: Secondary | ICD-10-CM | POA: Diagnosis not present

## 2019-12-01 DIAGNOSIS — N62 Hypertrophy of breast: Secondary | ICD-10-CM | POA: Diagnosis not present

## 2019-12-01 DIAGNOSIS — I8390 Asymptomatic varicose veins of unspecified lower extremity: Secondary | ICD-10-CM | POA: Diagnosis not present

## 2019-12-01 DIAGNOSIS — Z7901 Long term (current) use of anticoagulants: Secondary | ICD-10-CM | POA: Diagnosis not present

## 2019-12-02 ENCOUNTER — Ambulatory Visit: Payer: BC Managed Care – PPO | Admitting: Endocrinology

## 2019-12-14 DIAGNOSIS — Z1212 Encounter for screening for malignant neoplasm of rectum: Secondary | ICD-10-CM | POA: Diagnosis not present

## 2020-01-12 ENCOUNTER — Ambulatory Visit: Payer: BC Managed Care – PPO | Admitting: Endocrinology

## 2020-02-19 ENCOUNTER — Other Ambulatory Visit: Payer: Self-pay | Admitting: Family Medicine

## 2020-02-19 DIAGNOSIS — I4891 Unspecified atrial fibrillation: Secondary | ICD-10-CM

## 2020-02-19 DIAGNOSIS — I10 Essential (primary) hypertension: Secondary | ICD-10-CM

## 2020-02-23 ENCOUNTER — Ambulatory Visit: Payer: BC Managed Care – PPO | Admitting: Endocrinology

## 2020-02-26 ENCOUNTER — Other Ambulatory Visit: Payer: Self-pay

## 2020-02-26 ENCOUNTER — Ambulatory Visit: Payer: BC Managed Care – PPO | Attending: Internal Medicine

## 2020-02-26 DIAGNOSIS — Z23 Encounter for immunization: Secondary | ICD-10-CM

## 2020-02-26 NOTE — Progress Notes (Signed)
   Covid-[facility] Vaccination [facility]  Name:  Spencer English    MRN: [id number] DOB: 03/28/1966  02/26/2020  Mr. Spencer English was observed post Covid-19 immunization for 15 minutes without incidence. He was provided with Vaccine Information Sheet and instruction to access the V-Safe system.   Mr. Spencer English was instructed to call 911 with any severe reactions post vaccine: Marland Kitchen Difficulty breathing  . Swelling of your face and throat  . A fast heartbeat  . A bad rash all over your body  . Dizziness and weakness    Immunizations Administered    Name Date Dose VIS Date Route   Moderna COVID-19 Vaccine 02/26/2020  9:03 AM 0.5 mL 11/29/2019 Intramuscular   Manufacturer: Moderna   Lot: [id number]   [id number]

## 2020-03-14 ENCOUNTER — Other Ambulatory Visit: Payer: Self-pay | Admitting: Family Medicine

## 2020-03-14 DIAGNOSIS — I4891 Unspecified atrial fibrillation: Secondary | ICD-10-CM

## 2020-03-14 DIAGNOSIS — I10 Essential (primary) hypertension: Secondary | ICD-10-CM

## 2020-03-31 ENCOUNTER — Ambulatory Visit: Payer: BC Managed Care – PPO | Attending: Internal Medicine

## 2020-03-31 DIAGNOSIS — Z23 Encounter for immunization: Secondary | ICD-10-CM

## 2020-03-31 NOTE — Progress Notes (Signed)
   Covid-19 Vaccination Clinic  Name:  AZEKIEL PADLEY    MRN: [id number] DOB: 18-Sep-1966  03/31/2020  Mr. Okerson was observed post Covid-19 immunization for 15 minutes without incident. He was provided with Vaccine Information Sheet and instruction to access the V-Safe system.   Mr. Oros was instructed to call 911 with any severe reactions post vaccine: Marland Kitchen Difficulty breathing  . Swelling of face and throat  . A fast heartbeat  . A bad rash all over body  . Dizziness and weakness   Immunizations Administered    Name Date Dose VIS Date Route   Moderna COVID-19 Vaccine 03/31/2020  9:33 AM 0.5 mL 11/29/2019 Intramuscular   Manufacturer: Moderna   Lot: [id number]   OmroBE:[id number]

## 2020-04-02 ENCOUNTER — Other Ambulatory Visit: Payer: Self-pay | Admitting: Family Medicine

## 2020-04-02 DIAGNOSIS — I10 Essential (primary) hypertension: Secondary | ICD-10-CM

## 2020-04-02 DIAGNOSIS — I4891 Unspecified atrial fibrillation: Secondary | ICD-10-CM

## 2020-04-12 DIAGNOSIS — I480 Paroxysmal atrial fibrillation: Secondary | ICD-10-CM | POA: Diagnosis not present

## 2020-04-12 DIAGNOSIS — Z1389 Encounter for screening for other disorder: Secondary | ICD-10-CM | POA: Diagnosis not present

## 2020-04-12 DIAGNOSIS — E7800 Pure hypercholesterolemia, unspecified: Secondary | ICD-10-CM | POA: Diagnosis not present

## 2020-04-12 DIAGNOSIS — I10 Essential (primary) hypertension: Secondary | ICD-10-CM | POA: Diagnosis not present

## 2020-04-12 DIAGNOSIS — E119 Type 2 diabetes mellitus without complications: Secondary | ICD-10-CM | POA: Diagnosis not present

## 2020-04-12 DIAGNOSIS — I8390 Asymptomatic varicose veins of unspecified lower extremity: Secondary | ICD-10-CM | POA: Diagnosis not present

## 2020-04-12 DIAGNOSIS — E0590 Thyrotoxicosis, unspecified without thyrotoxic crisis or storm: Secondary | ICD-10-CM | POA: Diagnosis not present

## 2020-04-12 DIAGNOSIS — N529 Male erectile dysfunction, unspecified: Secondary | ICD-10-CM | POA: Diagnosis not present

## 2020-06-04 ENCOUNTER — Encounter: Payer: Self-pay | Admitting: Physician Assistant

## 2020-06-27 ENCOUNTER — Telehealth: Payer: Self-pay | Admitting: Endocrinology

## 2020-06-27 ENCOUNTER — Encounter: Payer: Self-pay | Admitting: Physician Assistant

## 2020-06-27 ENCOUNTER — Ambulatory Visit (INDEPENDENT_AMBULATORY_CARE_PROVIDER_SITE_OTHER): Payer: BC Managed Care – PPO | Admitting: Physician Assistant

## 2020-06-27 VITALS — BP 182/100 | HR 88 | Ht 72.0 in | Wt 280.0 lb

## 2020-06-27 DIAGNOSIS — Z01818 Encounter for other preprocedural examination: Secondary | ICD-10-CM

## 2020-06-27 DIAGNOSIS — Z7901 Long term (current) use of anticoagulants: Secondary | ICD-10-CM

## 2020-06-27 DIAGNOSIS — Z86718 Personal history of other venous thrombosis and embolism: Secondary | ICD-10-CM

## 2020-06-27 DIAGNOSIS — Z1212 Encounter for screening for malignant neoplasm of rectum: Secondary | ICD-10-CM

## 2020-06-27 DIAGNOSIS — Z1211 Encounter for screening for malignant neoplasm of colon: Secondary | ICD-10-CM | POA: Diagnosis not present

## 2020-06-27 MED ORDER — SUTAB 1479-225-188 MG PO TABS: 1.0000 | ORAL_TABLET | Freq: Once | ORAL | 0 refills | Status: AC

## 2020-06-27 NOTE — Telephone Encounter (Signed)
Please review and advise.

## 2020-06-27 NOTE — Patient Instructions (Addendum)
If you are age 55 or older, your body mass index should be between 23-30. Your Body mass index is 37.97 kg/m. If this is out of the aforementioned range listed, please consider follow up with your Primary Care Provider.  If you are age 67 or younger, your body mass index should be between 19-25. Your Body mass index is 37.97 kg/m. If this is out of the aformentioned range listed, please consider follow up with your Primary Care Provider.   You will be contacted by our office prior to your procedure for directions on holding your Xarelto.  If you do not hear from our office 1 week prior to your scheduled procedure, please call [phone] to discuss.   You have been scheduled for a colonoscopy. Please follow written instructions given to you at your visit today.  Please pick up your prep supplies at the pharmacy within the next 1-3 days. If you use inhalers (even only as needed), please bring them with you on the day of your procedure.  Due to recent changes in healthcare laws, you may see the results of your imaging and laboratory studies on MyChart before your provider has had a chance to review them.  We understand that in some cases there may be results that are confusing or concerning to you. Not all laboratory results come back in the same time frame and the provider may be waiting for multiple results in order to interpret others.  Please give [place] 48 hours in order for your provider to thoroughly review all the results before contacting the office for clarification of your results.

## 2020-06-27 NOTE — Telephone Encounter (Signed)
Please do PA, thanks

## 2020-06-27 NOTE — Telephone Encounter (Signed)
Erroneous response. Refer to previously created encounter for this same DOS.

## 2020-06-27 NOTE — Telephone Encounter (Signed)
Patient has called to get paperwork completed for [employer] to cover his Januvia and to get an RX for Januvia.  When advised that he had not seen Dr Loanne Drilling since 09/02/2019 and that an appointment would probably be needed for refillhe scheduled for 07/19/2020 (1st available).   Patient stated is out of medication and would like to know what to take until his visit with Dr Loanne Drilling

## 2020-06-27 NOTE — Telephone Encounter (Signed)
Erroneous response received from Dr. Loanne Drilling as follows:  Please do PA, thanks  Spoke to Dr. Loanne Drilling in person and advised PA is not required but rather that pt is requesting an alternative to [place] until PAP with [employer] can be completed or to complete PAP with [employer] BEFORE his next appt. Per Dr. Loanne Drilling, we are not withholding his Rx but that pt will need to purchase out of pocket until he can be seen and this can be addressed.  Called pt and informed him of above response. Pt states out of pocket expense is $800.00. Understands he needs to come in to be seen. Further states, in the past Dr. Loanne Drilling ordered Wilder Glade. He states he still has this Rx and will plan to take [place] and control his diet until seen 07/19/20. Advised I will forward this message to Dr. Loanne Drilling to make him aware. Verbalized acceptance and understanding.

## 2020-06-27 NOTE — Progress Notes (Signed)
Chief Complaint: Preprocedure visit for screening colonoscopy for patient with chronic anticoagulation  HPI:    Mr. Spencer English is a 54 year old African-American male with past medical history of DVT and PE on Xarelto (10/14/2018 echo LVEF 55-60%), who was referred to me by Tisovec, Fransico Him, MD for a preprocedural visit for screening colonoscopy.      Today, the patient presents to clinic and explains that he is doing well.  He denies any GI complaints or concerns but tells me that he is overdue for colonoscopy.    Denies fever, chills, weight loss, change in bowel habits or abdominal pain.     Past Medical History:  Diagnosis Date  . ALLERGIC RHINITIS 04/12/2009  . Atrial fibrillation ([place])   . BACK PAIN, LUMBAR 01/14/2011  . DEEP VENOUS THROMBOPHLEBITIS, LEG, LEFT 05/05/2009  . DM 08/08/2008  . DYSLIPIDEMIA 04/26/2009  . ECZEMA 05/23/2010  . 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 GERD (gastroesophageal reflux disease)   . GOUT 10/24/2010  . Hyperglycemia   . HYPERTENSION 07/21/2007  . 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 Leukopenia   . Long term (current) use of anticoagulants 04/03/2011  . PULMONARY EMBOLISM 05/03/2009  . UNSPECIFIED URINARY CALCULUS 07/04/2010    Past Surgical History:  Procedure Laterality Date  . ANTERIOR CRUCIATE LIGAMENT REPAIR  2000   Right  . CYSTECTOMY     back of head  . KNEE ARTHROSCOPY     left knee  . KNEE ARTHROSCOPY  01/03/2013   Procedure: ARTHROSCOPY KNEE;  Surgeon: Yvette Rack., MD;  Location: [place];  Service: Orthopedics;  Laterality: Left;  Lavage Synovectomy, Removal of loose body  . KNEE ARTHROSCOPY W/ ACL RECONSTRUCTION     right knee    Current Outpatient Medications  Medication Sig Dispense Refill  . allopurinol (ZYLOPRIM) 300 MG tablet Take 300 mg by mouth as needed (gout flare up).    Marland Kitchen atorvastatin (LIPITOR) 20 MG tablet Take 1 tablet (20 mg total) by mouth daily. 90 tablet 3  . dapagliflozin propanediol (FARXIGA) 5 MG TABS tablet Take 5 mg by  mouth daily before breakfast. 30 tablet 11  . fluticasone (FLONASE) 50 MCG/ACT nasal spray Place 1 spray into both nostrils daily. allergies    . furosemide (LASIX) 40 MG tablet TAKE 1 TABLET BY MOUTH EVERY DAY 30 tablet 2  . glucose blood (CONTOUR NEXT TEST) test strip 1 each by Other route daily. And lancets 1/day 100 each 3  . halobetasol (ULTRAVATE) 0.05 % cream APPLY CREAM TOPICALLY THREE TIMES DAILY AS NEEDED FOR RASH 15 g 0  . losartan (COZAAR) 100 MG tablet TAKE 1 TABLET BY MOUTH ONCE DAILY 30 tablet 11  . methimazole (TAPAZOLE) 10 MG tablet Take 1 tablet (10 mg total) by mouth daily. 90 tablet 3  . metoprolol succinate (TOPROL-XL) 25 MG 24 hr tablet TAKE 1 AND 1/2 TABLETS(37.5 MG) BY MOUTH DAILY 45 tablet 3  . ondansetron (ZOFRAN ODT) 4 MG disintegrating tablet Take 1 tablet (4 mg total) by mouth every 6 (six) hours as needed for nausea or vomiting. 20 tablet 0  . sitaGLIPtin (JANUVIA) 100 MG tablet Take 1 tablet (100 mg total) by mouth daily. 90 tablet 3  . spironolactone (ALDACTONE) 25 MG tablet Take 1 tablet (25 mg total) by mouth daily. 30 tablet 11  . Verapamil HCl CR 300 MG CP24 Take 1 capsule (300 mg total) by mouth daily. 30 capsule 2  . XARELTO 20 MG TABS tablet TAKE 1 TABLET(20 MG) BY MOUTH  DAILY WITH SUPPER 90 tablet 1   No current facility-administered medications for this visit.    Allergies as of 06/27/2020 - Review Complete 06/27/2020  Allergen Reaction Noted  . Metformin Diarrhea and Nausea Only 11/10/2008  . Viagra [sildenafil citrate] Other (See Comments) 10/27/2013    Family History  Problem Relation Age of Onset  . Diabetes Mother   . Hypertension Mother   . Hyperlipidemia Mother   . Diabetes Father   . Hypertension Father   . Hyperlipidemia Father   . Heart attack Father   . Heart disease Father   . Prostate cancer Father   . Stomach cancer Neg Hx   . Colon cancer Neg Hx   . Pancreatic cancer Neg Hx   . Esophageal cancer Neg Hx     Social History     Socioeconomic History  . Marital status: Married    Spouse name: Not on file  . Number of children: 3  . Years of education: Not on file  . Highest education level: Not on file  Occupational History  . Occupation: Truck Education administrator: [employer]    Employer: [employer]  Tobacco Use  . Smoking status: Light Tobacco Smoker    Types: Cigars    Last attempt to quit: 07/29/2018    Years since quitting: 1.9  . Smokeless tobacco: Never Used  Vaping Use  . Vaping Use: Never used  Substance and Sexual Activity  . Alcohol use: Yes    Comment: Beer everyother weekend   . Drug use: No  . Sexual activity: Not on file  Other Topics Concern  . Not on file  Social History Narrative  . Not on file   Social Determinants of Health   Financial Resource Strain:   . Difficulty of Paying Living Expenses:   Food Insecurity:   . Worried About Charity fundraiser in the Last Year:   . Arboriculturist in the Last Year:   Transportation Needs:   . Film/video editor (Medical):   Marland Kitchen Lack of Transportation (Non-Medical):   Physical Activity:   . Days of Exercise per Week:   . Minutes of Exercise per Session:   Stress:   . Feeling of Stress :   Social Connections:   . Frequency of Communication with Friends and Family:   . Frequency of Social Gatherings with Friends and Family:   . Attends Religious Services:   . Active Member of Clubs or Organizations:   . Attends Archivist Meetings:   Marland Kitchen Marital Status:   Intimate Partner Violence:   . Fear of Current or Ex-Partner:   . Emotionally Abused:   Marland Kitchen Physically Abused:   . Sexually Abused:     Review of Systems:    Constitutional: No weight loss, fever or chills Skin: No rash Cardiovascular: No chest pain Respiratory: No SOB  Gastrointestinal: See HPI and otherwise negative Genitourinary: No dysuria Neurological: No headache Musculoskeletal: No new muscle or joint pain Hematologic: No bleeding   Psychiatric: No history of depression or anxiety   Physical Exam:  Vital signs: BP (!) 182/100   Pulse 88   Ht 6' (1.829 m)   Wt 280 lb (127 kg)   BMI 37.97 kg/m   Constitutional:   Pleasant AA male appears to be in NAD, Well developed, Well nourished, alert and cooperative Head:  Normocephalic and atraumatic. Eyes:   PEERL, EOMI. No icterus. Conjunctiva pink. Ears:  Normal auditory  acuity. Neck:  Supple Throat: Oral cavity and pharynx without inflammation, swelling or lesion.  Respiratory: Respirations even and unlabored. Lungs clear to auscultation bilaterally.   No wheezes, crackles, or rhonchi.  Cardiovascular: Normal S1, S2. No MRG. Regular rate and rhythm. No peripheral edema, cyanosis or pallor.  Gastrointestinal:  Soft, nondistended, nontender. No rebound or guarding. Normal bowel sounds. No appreciable masses or hepatomegaly. Rectal:  Not performed.  Msk:  Symmetrical without gross deformities. Without edema, no deformity or joint abnormality.  Neurologic:  Alert and  oriented x4;  grossly normal neurologically.  Skin:   Dry and intact without significant lesions or rashes. Psychiatric: Oriented to person, place and time. Demonstrates good judgement and reason without abnormal affect or behaviors.  No recent labs or imaging.  Assessment: 1.  Screening for colorectal cancer: Patient is 54 years old and never had a screening colonoscopy 2.  Chronic anticoagulation for history of PE and DVT: With Xarelto  Plan: 1.  Scheduled patient for screening colonoscopy in the [place] with Dr. Silverio Decamp, did discuss risks, benefits, limitations and alternatives and patient agrees to proceed. 2.  Patient was advised to hold his Xarelto for 2 days prior to time of procedure.  We will communicate with his prescribing physician to ensure that holding his Xarelto is acceptable for him. 3.  Patient to follow in clinic per recommendations from Dr. Silverio Decamp after time of procedure.  Ellouise Newer, PA-C [facility] 06/27/2020, 3:21 PM  Cc: Haywood Pao, MD

## 2020-06-28 ENCOUNTER — Telehealth: Payer: Self-pay | Admitting: *Deleted

## 2020-06-28 NOTE — Telephone Encounter (Signed)
[facility] Pre-operative Risk Assessment     Request for surgical clearance:     Endoscopy Procedure  What type of surgery is being performed?     Colonoscopy  When is this surgery scheduled?     Tuesday 08/21/20  What type of clearance is required ?   Pharmacy  Are there any medications that need to be held prior to surgery and how long? Xarelto 2 days  Practice name and name of physician performing surgery?    K. Silverio Decamp, MD - [facility]  What is your office phone and fax number?      Phone- [phone]  [phone]  Anesthesia type (None, local, MAC, general) ?       MAC

## 2020-06-28 NOTE — Telephone Encounter (Signed)
Tried to call pt-mailbox is full unable to leave message. Will have to try again later

## 2020-06-28 NOTE — Progress Notes (Signed)
Reviewed and agree with documentation and assessment and plan. K. Veena Mylon Mabey , MD   

## 2020-06-28 NOTE — Telephone Encounter (Signed)
Primary Cardiologist:James Allred, MD  Chart reviewed as part of pre-operative protocol coverage. Because of Angelus Hoopes Line's past medical history and time since last visit, he/she will require a follow-up visit in order to better assess preoperative cardiovascular risk.  Pre-op covering staff: - Please schedule appointment and call patient to inform them. - Please contact requesting surgeon's office via preferred method (i.e, phone, fax) to inform them of need for appointment prior to surgery.  If applicable, this message will also be routed to pharmacy pool and/or primary cardiologist for input on holding anticoagulant/antiplatelet agent as requested below so that this information is available at time of patient's appointment.   Deberah Pelton, NP  06/28/2020, 10:24 AM

## 2020-06-28 NOTE — Progress Notes (Signed)
Reviewed and agree with documentation and assessment and plan. K. Veena Vandora Jaskulski , MD   

## 2020-06-28 NOTE — Telephone Encounter (Signed)
Patient with diagnosis of A Fib on Xarelto for anticoagulation.    Procedure: colonscopy Date of procedure: 08/21/20  CHADS2-VASc score of  4  (HTN, DM2, stroke/tia x 2)  CrCl 117 mL/min  Per office protocol, patient can hold Xarelto for 1-2 days prior to procedure.

## 2020-06-30 ENCOUNTER — Ambulatory Visit
Admission: EM | Admit: 2020-06-30 | Discharge: 2020-06-30 | Disposition: A | Discharge status: Home / Self Care | Payer: BC Managed Care – PPO

## 2020-06-30 ENCOUNTER — Other Ambulatory Visit: Payer: Self-pay

## 2020-06-30 NOTE — ED Notes (Signed)
This RN went in to assess patient and do triage.  Patient wants a guarantee that he will be written a prescription before doing any triage.  RN to speak to provider prior to triage.

## 2020-06-30 NOTE — ED Notes (Signed)
APP in room discussing with patient at this time.

## 2020-06-30 NOTE — ED Notes (Signed)
Patient does not want to be assessed and has decided to leave/receive a refund.  Will d/c.

## 2020-07-01 ENCOUNTER — Ambulatory Visit (HOSPITAL_COMMUNITY)
Admission: EM | Admit: 2020-07-01 | Discharge: 2020-07-01 | Disposition: A | Discharge status: Home / Self Care | Payer: BC Managed Care – PPO | Attending: Family Medicine | Admitting: Family Medicine

## 2020-07-01 ENCOUNTER — Encounter (HOSPITAL_COMMUNITY): Payer: Self-pay

## 2020-07-01 ENCOUNTER — Other Ambulatory Visit: Payer: Self-pay

## 2020-07-01 DIAGNOSIS — R030 Elevated blood-pressure reading, without diagnosis of hypertension: Secondary | ICD-10-CM

## 2020-07-01 DIAGNOSIS — I4891 Unspecified atrial fibrillation: Secondary | ICD-10-CM

## 2020-07-01 DIAGNOSIS — E119 Type 2 diabetes mellitus without complications: Secondary | ICD-10-CM

## 2020-07-01 DIAGNOSIS — I10 Essential (primary) hypertension: Secondary | ICD-10-CM

## 2020-07-01 DIAGNOSIS — Z760 Encounter for issue of repeat prescription: Secondary | ICD-10-CM

## 2020-07-01 DIAGNOSIS — I272 Pulmonary hypertension, unspecified: Secondary | ICD-10-CM

## 2020-07-01 DIAGNOSIS — N489 Disorder of penis, unspecified: Secondary | ICD-10-CM

## 2020-07-01 MED ORDER — FLUCONAZOLE 150 MG PO TABS: 150.0000 mg | ORAL_TABLET | ORAL | 0 refills | Status: DC

## 2020-07-01 MED ORDER — VERAPAMIL HCL ER 300 MG PO CP24: 1.0000 | ORAL_CAPSULE | Freq: Every day | ORAL | 0 refills | Status: DC

## 2020-07-01 NOTE — ED Triage Notes (Signed)
Patient presents today for medication refill on Verapamil ER 300 mg and Losartan 100 mg; complaints of penis itching for the past three weeks. Patient states he has not followed up with his PCP for the medication refill - appt has been scheduled for follow up. Patient states spouse was DX Yeast Infection and prescribed Diflucan 150 mg  - he feels this is what he has again.  Patient denies: fever. Penis discharge, chest pain, shortness of breath, numbness, tingling

## 2020-07-01 NOTE — Discharge Instructions (Addendum)

## 2020-07-01 NOTE — ED Provider Notes (Signed)
[facility]   MRN: [id number] DOB: [birth year]  Subjective:   Spencer English is a 54 y.o. male presenting for medication refill his verapamil, ran out this morning.  Patient also reports slight internal penile itching.  States that he has had yeast infections before that are helped by fluconazole.  Regarding his blood pressure, patient admits dietary noncompliance.  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  He also drinks sodas every day.  He has not followed up with his PCP but did set up an appointment for next month.  No current facility-administered medications for this encounter.  Current Outpatient Medications:    allopurinol (ZYLOPRIM) 300 MG tablet, Take 300 mg by mouth as needed (gout flare up)., Disp: , Rfl:    atorvastatin (LIPITOR) 20 MG tablet, Take 1 tablet (20 mg total) by mouth daily., Disp: 90 tablet, Rfl: 3   dapagliflozin propanediol (FARXIGA) 5 MG TABS tablet, Take 5 mg by mouth daily before breakfast., Disp: 30 tablet, Rfl: 11   fluticasone (FLONASE) 50 MCG/ACT nasal spray, Place 1 spray into both nostrils daily. allergies, Disp: , Rfl:    furosemide (LASIX) 40 MG tablet, TAKE 1 TABLET BY MOUTH EVERY DAY, Disp: 30 tablet, Rfl: 2   glucose blood (CONTOUR NEXT TEST) test strip, 1 each by Other route daily. And lancets 1/day, Disp: 100 each, Rfl: 3   halobetasol (ULTRAVATE) 0.05 % cream, APPLY CREAM TOPICALLY THREE TIMES DAILY AS NEEDED FOR RASH, Disp: 15 g, Rfl: 0   losartan (COZAAR) 100 MG tablet, TAKE 1 TABLET BY MOUTH ONCE DAILY, Disp: 30 tablet, Rfl: 11   methimazole (TAPAZOLE) 10 MG tablet, Take 1 tablet (10 mg total) by mouth daily., Disp: 90 tablet, Rfl: 3   metoprolol succinate (TOPROL-XL) 25 MG 24 hr tablet, TAKE 1 AND 1/2 TABLETS(37.5 MG) BY MOUTH DAILY, Disp: 45 tablet, Rfl: 3   ondansetron (ZOFRAN ODT) 4 MG disintegrating tablet, Take 1 tablet (4 mg total) by mouth every 6 (six) hours as needed for  nausea or vomiting., Disp: 20 tablet, Rfl: 0   sitaGLIPtin (JANUVIA) 100 MG tablet, Take 1 tablet (100 mg total) by mouth daily., Disp: 90 tablet, Rfl: 3   spironolactone (ALDACTONE) 25 MG tablet, Take 1 tablet (25 mg total) by mouth daily., Disp: 30 tablet, Rfl: 11   Verapamil HCl CR 300 MG CP24, Take 1 capsule (300 mg total) by mouth daily., Disp: 30 capsule, Rfl: 2   XARELTO 20 MG TABS tablet, TAKE 1 TABLET(20 MG) BY MOUTH DAILY WITH SUPPER, Disp: 90 tablet, Rfl: 1   Allergies  Allergen Reactions   Metformin Diarrhea and Nausea Only   Viagra [Sildenafil Citrate] Other (See Comments)    headache    Past Medical History:  Diagnosis Date   ALLERGIC RHINITIS 04/12/2009   Atrial fibrillation ([place])    BACK PAIN, LUMBAR 01/14/2011   DEEP VENOUS THROMBOPHLEBITIS, LEG, LEFT 05/05/2009   DM 08/08/2008   DYSLIPIDEMIA 04/26/2009   ECZEMA 05/23/2010   ERECTILE DYSFUNCTION, 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   GERD (gastroesophageal reflux disease)    GOUT 10/24/2010   Hyperglycemia    HYPERTENSION 07/21/2007   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   Leukopenia    Long term (current) use of anticoagulants 04/03/2011   PULMONARY EMBOLISM 05/03/2009   UNSPECIFIED URINARY CALCULUS 07/04/2010     Past Surgical History:  Procedure Laterality Date   ANTERIOR CRUCIATE LIGAMENT REPAIR  2000   Right   CYSTECTOMY  back of head   KNEE ARTHROSCOPY     left knee   KNEE ARTHROSCOPY  01/03/2013   Procedure: ARTHROSCOPY KNEE;  Surgeon: Yvette Rack., MD;  Location: [place];  Service: Orthopedics;  Laterality: Left;  Lavage Synovectomy, Removal of loose body   KNEE ARTHROSCOPY W/ ACL RECONSTRUCTION     right knee    Family History  Problem Relation Age of Onset   Diabetes Mother    Hypertension Mother    Hyperlipidemia Mother    Diabetes Father    Hypertension Father    Hyperlipidemia Father    Heart attack Father    Heart disease Father    Prostate cancer Father    Stomach cancer Neg Hx      Colon cancer Neg Hx    Pancreatic cancer Neg Hx    Esophageal cancer Neg Hx     Social History   Tobacco Use   Smoking status: Light Tobacco Smoker    Types: Cigars    Last attempt to quit: 07/29/2018    Years since quitting: 1.9   Smokeless tobacco: Never Used  Vaping Use   Vaping Use: Never used  Substance Use Topics   Alcohol use: Yes    Comment: Beer everyother weekend    Drug use: No    Review of Systems  Constitutional: Negative for fever and malaise/fatigue.  HENT: Negative for congestion, ear pain, sinus pain and sore throat.   Eyes: Negative for blurred vision, double vision, discharge and redness.  Respiratory: Negative for cough, hemoptysis, shortness of breath and wheezing.   Cardiovascular: Negative for chest pain.  Gastrointestinal: Negative for abdominal pain, diarrhea, nausea and vomiting.  Genitourinary: Negative for dysuria, flank pain, frequency, hematuria and urgency.  Musculoskeletal: Negative for myalgias.  Skin: Negative for rash.  Neurological: Negative for dizziness, tingling, speech change, focal weakness, weakness and headaches.  Psychiatric/Behavioral: Negative for depression and substance abuse.     Objective:   Vitals: BP (!) 177/114 (BP Location: Left Arm)    Pulse 87    Temp 98.7 F (37.1 C) (Oral)    Resp 16    SpO2 98%   Physical Exam Constitutional:      General: He is not in acute distress.    Appearance: Normal appearance. He is well-developed. He is not ill-appearing, toxic-appearing or diaphoretic.  HENT:     Head: Normocephalic and atraumatic.     Right Ear: External ear normal.     Left Ear: External ear normal.     Nose: Nose normal.     Mouth/Throat:     Mouth: Mucous membranes are moist.     Pharynx: Oropharynx is clear.  Eyes:     General: No scleral icterus.       Right eye: No discharge.        Left eye: No discharge.     Extraocular Movements: Extraocular movements intact.     Conjunctiva/sclera:  Conjunctivae normal.     Pupils: Pupils are equal, round, and reactive to light.  Neck:     Vascular: No carotid bruit.  Cardiovascular:     Rate and Rhythm: Normal rate and regular rhythm.     Heart sounds: Normal heart sounds. No murmur heard.  No friction rub. No gallop.   Pulmonary:     Effort: Pulmonary effort is normal. No respiratory distress.     Breath sounds: Normal breath sounds. No stridor. No wheezing, rhonchi or rales.  Musculoskeletal:     Right  lower leg: Edema (1+ bilaterally) present.     Left lower leg: Edema present.  Skin:    General: Skin is warm and dry.     Findings: No rash.  Neurological:     Mental Status: He is alert and oriented to person, place, and time.     Cranial Nerves: No cranial nerve deficit.     Motor: No weakness.     Coordination: Coordination normal.     Gait: Gait normal.     Deep Tendon Reflexes: Reflexes normal.     Comments: Negative Romberg and pronator drift.  Psychiatric:        Mood and Affect: Mood normal.        Behavior: Behavior normal.        Thought Content: Thought content normal.        Judgment: Judgment normal.      Assessment and Plan :   PDMP not reviewed this encounter.  1. Medication refill   2. Essential hypertension   3. Atrial fibrillation, unspecified type ([place])   4. Elevated blood pressure reading   5. Well controlled diabetes mellitus ([place])   6. Moderate to severe pulmonary hypertension ([place])   7. Penile abnormality     Patient has significant risk factors and we discussed this at length.  He has a history of chronic pulmonary embolism, DVT, atrial fibrillation, uncontrolled hypertension and emphasized need for medical and dietary compliance.  He is not interested in report to the emergency room today.  He does not have physical exam findings suggesting that he warrants an ER visit now.  I refilled his verapamil.  Discussed strict ER precautions.  Also provided him with a prescription for Diflucan to  address yeast infection as he is high risk given his diabetes. Counseled patient on potential for adverse effects with medications prescribed/recommended today, ER and return-to-clinic precautions discussed, patient verbalized understanding.    Jaynee Eagles, PA-C 07/01/20 1120

## 2020-07-04 NOTE — Telephone Encounter (Signed)
Tried calling patient to get him scheduled for a pre op clearance appointment and his voicemail box is full. Will send this the scheduling to try to get patient schedule with an appointment with Dr. Rayann Heman or an APP.

## 2020-07-16 DIAGNOSIS — R350 Frequency of micturition: Secondary | ICD-10-CM | POA: Diagnosis not present

## 2020-07-16 DIAGNOSIS — E119 Type 2 diabetes mellitus without complications: Secondary | ICD-10-CM | POA: Diagnosis not present

## 2020-07-16 DIAGNOSIS — R42 Dizziness and giddiness: Secondary | ICD-10-CM | POA: Diagnosis not present

## 2020-07-16 DIAGNOSIS — I480 Paroxysmal atrial fibrillation: Secondary | ICD-10-CM | POA: Diagnosis not present

## 2020-07-16 DIAGNOSIS — Z7901 Long term (current) use of anticoagulants: Secondary | ICD-10-CM | POA: Diagnosis not present

## 2020-07-16 DIAGNOSIS — I10 Essential (primary) hypertension: Secondary | ICD-10-CM | POA: Diagnosis not present

## 2020-07-16 LAB — TSH: TSH: 1.36 (ref 0.41–5.90)

## 2020-07-16 LAB — BASIC METABOLIC PANEL: Creatinine: 1.2 (ref 0.6–1.3)

## 2020-07-19 ENCOUNTER — Other Ambulatory Visit: Payer: Self-pay

## 2020-07-19 ENCOUNTER — Encounter: Payer: Self-pay | Admitting: Endocrinology

## 2020-07-19 ENCOUNTER — Ambulatory Visit (INDEPENDENT_AMBULATORY_CARE_PROVIDER_SITE_OTHER): Payer: BC Managed Care – PPO | Admitting: Endocrinology

## 2020-07-19 VITALS — BP 148/100 | HR 87 | Ht 72.0 in | Wt 268.0 lb

## 2020-07-19 DIAGNOSIS — E0590 Thyrotoxicosis, unspecified without thyrotoxic crisis or storm: Secondary | ICD-10-CM | POA: Diagnosis not present

## 2020-07-19 DIAGNOSIS — E119 Type 2 diabetes mellitus without complications: Secondary | ICD-10-CM

## 2020-07-19 LAB — BASIC METABOLIC PANEL
BUN: 12 mg/dL (ref 6–23)
CO2: 29 mEq/L (ref 19–32)
Calcium: 9.7 mg/dL (ref 8.4–10.5)
Chloride: 100 mEq/L (ref 96–112)
Creatinine, Ser: 1.38 mg/dL (ref 0.40–1.50)
GFR: 64.72 mL/min (ref >60.00)
Glucose, Bld: 142 mg/dL — ABNORMAL HIGH (ref 70–99)
Potassium: 4.1 mEq/L (ref 3.5–5.1)
Sodium: 138 mEq/L (ref 135–145)

## 2020-07-19 LAB — T4, FREE: Free T4: 0.87 ng/dL (ref 0.60–1.60)

## 2020-07-19 LAB — POCT GLYCOSYLATED HEMOGLOBIN (HGB A1C): Hemoglobin A1C: 7.3 % — AB (ref 4.0–5.6)

## 2020-07-19 LAB — TSH: TSH: 1.18 u[IU]/mL (ref 0.35–4.50)

## 2020-07-19 NOTE — Patient Instructions (Addendum)
Your blood pressure is high today.  Please see your primary care provider soon, to have it rechecked Please continue the same medications If ever you have fever while taking methimazole, stop it and call us, even if the reason is obvious, because of the risk of a rare side-effect.  Please sign a release of information for the blood tests from Dr Osborne Casco.   check your blood sugar once a day.  vary the time of day when you check, between before the 3 meals, and at bedtime.  also check if you have symptoms of your blood sugar being too high or too low.  please keep a record of the readings and bring it to your next appointment here (or you can bring the meter itself).  You can write it on any piece of paper.  please call us sooner if your blood sugar goes below 70, or if you have a lot of readings over 200.  Please come back for a follow-up appointment in 3 months.

## 2020-07-19 NOTE — Progress Notes (Signed)
Subjective:    Patient ID: Spencer English, male    DOB: [birth year], 54 y.o.   MRN: [id number]  HPI Pt returns for f/u of diabetes mellitus: DM type: 2 Dx'ed: 3846 Complications: PAD (seen on CT).  Therapy: 2 oral meds.   DKA: never Severe hypoglycemia: never Pancreatitis: never Pancreatic imaging: normal on 2019 CT Other: he did not tolerate metformin-XR (nausea).   Interval history: pt states he feels well in general.   Pt also returns for f/u of hyperthyroidism (dx'ed 2014; he chose tapazole rx; he has never had dedicated thyroid imaging, but CT of [facility] made no mention of the thyroid).  Past Medical History:  Diagnosis Date  . ALLERGIC RHINITIS 04/12/2009  . Atrial fibrillation ([place])   . BACK PAIN, LUMBAR 01/14/2011  . DEEP VENOUS THROMBOPHLEBITIS, LEG, LEFT 05/05/2009  . DM 08/08/2008  . DYSLIPIDEMIA 04/26/2009  . ECZEMA 05/23/2010  . 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 GERD (gastroesophageal reflux disease)   . GOUT 10/24/2010  . Hyperglycemia   . HYPERTENSION 07/21/2007  . 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 Leukopenia   . Long term (current) use of anticoagulants 04/03/2011  . PULMONARY EMBOLISM 05/03/2009  . UNSPECIFIED URINARY CALCULUS 07/04/2010    Past Surgical History:  Procedure Laterality Date  . ANTERIOR CRUCIATE LIGAMENT REPAIR  2000   Right  . CYSTECTOMY     back of head  . KNEE ARTHROSCOPY     left knee  . KNEE ARTHROSCOPY  01/03/2013   Procedure: ARTHROSCOPY KNEE;  Surgeon: Yvette Rack., MD;  Location: [facility];  Service: Orthopedics;  Laterality: Left;  Lavage Synovectomy, Removal of loose body  . KNEE ARTHROSCOPY W/ ACL RECONSTRUCTION     right knee    Social History   Socioeconomic History  . Marital status: Married    Spouse name: Not on file  . Number of children: 3  . Years of education: Not on file  . Highest education level: Not on file  Occupational History  . 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 Smoking status: Light Tobacco Smoker    Types: Cigars    Last attempt to quit: 07/29/2018    Years since quitting: 1.9  . Smokeless tobacco: Never Used  Vaping Use  . Vaping Use: Never used  Substance and Sexual Activity  . Alcohol use: Yes    Comment: Beer everyother weekend   . Drug use: No  . Sexual activity: Yes    Birth control/protection: None  Other Topics Concern  . Not on file  Social History Narrative  . Not on file   Social Determinants of Health   Financial Resource Strain:   . Difficulty of Paying Living Expenses:   Food Insecurity:   . Worried About Charity fundraiser in the Last Year:   . Arboriculturist in the Last Year:   Transportation Needs:   . Film/video editor (Medical):   Marland Kitchen Lack of Transportation (Non-Medical):   Physical Activity:   . Days of Exercise per Week:   . Minutes of Exercise per Session:   Stress:   . Feeling of Stress :   Social Connections:   . Frequency of Communication with Friends and Family:   . Frequency of Social Gatherings with Friends and Family:   . Attends Religious Services:   . Active Member of Clubs or Organizations:   . Attends  Club or Organization Meetings:   Marland Kitchen Marital Status:   Intimate Partner Violence:   . Fear of Current or Ex-Partner:   . Emotionally Abused:   Marland Kitchen Physically Abused:   . Sexually Abused:     Current Outpatient Medications on File Prior to Visit  Medication Sig Dispense Refill  . allopurinol (ZYLOPRIM) 300 MG tablet Take 300 mg by mouth as needed (gout flare up).    Marland Kitchen atorvastatin (LIPITOR) 20 MG tablet Take 1 tablet (20 mg total) by mouth daily. 90 tablet 3  . dapagliflozin propanediol (FARXIGA) 5 MG TABS tablet Take 5 mg by mouth daily before breakfast. 30 tablet 11  . fluconazole (DIFLUCAN) 150 MG tablet Take 1 tablet (150 mg total) by mouth once a week. 2 tablet 0  . fluticasone (FLONASE) 50 MCG/ACT nasal spray Place 1 spray into both nostrils daily.  allergies    . furosemide (LASIX) 40 MG tablet TAKE 1 TABLET BY MOUTH EVERY DAY 30 tablet 2  . glucose blood (CONTOUR NEXT TEST) test strip 1 each by Other route daily. And lancets 1/day 100 each 3  . halobetasol (ULTRAVATE) 0.05 % cream APPLY CREAM TOPICALLY THREE TIMES DAILY AS NEEDED FOR RASH 15 g 0  . losartan (COZAAR) 100 MG tablet TAKE 1 TABLET BY MOUTH ONCE DAILY 30 tablet 11  . methimazole (TAPAZOLE) 10 MG tablet Take 1 tablet (10 mg total) by mouth daily. 90 tablet 3  . metoprolol succinate (TOPROL-XL) 25 MG 24 hr tablet TAKE 1 AND 1/2 TABLETS(37.5 MG) BY MOUTH DAILY 45 tablet 3  . ondansetron (ZOFRAN ODT) 4 MG disintegrating tablet Take 1 tablet (4 mg total) by mouth every 6 (six) hours as needed for nausea or vomiting. 20 tablet 0  . sitaGLIPtin (JANUVIA) 100 MG tablet Take 1 tablet (100 mg total) by mouth daily. 90 tablet 3  . spironolactone (ALDACTONE) 25 MG tablet Take 1 tablet (25 mg total) by mouth daily. 30 tablet 11  . Verapamil HCl CR 300 MG CP24 Take 1 capsule (300 mg total) by mouth daily. 60 capsule 0  . XARELTO 20 MG TABS tablet TAKE 1 TABLET(20 MG) BY MOUTH DAILY WITH SUPPER 90 tablet 1   No current facility-administered medications on file prior to visit.    Allergies  Allergen Reactions  . Metformin Diarrhea and Nausea Only  . Viagra [Sildenafil Citrate] Other (See Comments)    headache    Family History  Problem Relation Age of Onset  . Diabetes Mother   . Hypertension Mother   . Hyperlipidemia Mother   . Diabetes Father   . Hypertension Father   . Hyperlipidemia Father   . Heart attack Father   . Heart disease Father   . Prostate cancer Father   . Stomach cancer Neg Hx   . Colon cancer Neg Hx   . Pancreatic cancer Neg Hx   . Esophageal cancer Neg Hx     BP (!) 148/100   Pulse 87   Ht 6' (1.829 m)   Wt 268 lb (121.6 kg)   SpO2 98%   BMI 36.35 kg/m    Review of Systems Denies fever.      Objective:   Physical Exam VITAL SIGNS:  See vs  page GENERAL: no distress Pulses: dorsalis pedis intact bilat.   MSK: no deformity of the feet CV: no leg edema Skin:  no ulcer on the feet.  normal color and temp on the feet.  Neuro: sensation is intact to touch on the  feet.    A1c=7.3%     Assessment & Plan:  Hyperthyroidism: recheck today Type 2 DM, with PAD: he would benefit from increased rx, if it can be done with a regimen that avoids or minimizes hypoglycemia, but he declines to increase rx. Hyperthyroidism: I advised pt to recheck labs, but he says he recently had this done by Dr Osborne Casco  Patient Instructions  Your blood pressure is high today.  Please see your primary care provider soon, to have it rechecked Please continue the same medications If ever you have fever while taking methimazole, stop it and call us, even if the reason is obvious, because of the risk of a rare side-effect.  Please sign a release of information for the blood tests from Dr Osborne Casco.   check your blood sugar once a day.  vary the time of day when you check, between before the 3 meals, and at bedtime.  also check if you have symptoms of your blood sugar being too high or too low.  please keep a record of the readings and bring it to your next appointment here (or you can bring the meter itself).  You can write it on any piece of paper.  please call us sooner if your blood sugar goes below 70, or if you have a lot of readings over 200.  Please come back for a follow-up appointment in 3 months.

## 2020-07-23 ENCOUNTER — Telehealth: Payer: Self-pay

## 2020-07-23 ENCOUNTER — Other Ambulatory Visit: Payer: Self-pay

## 2020-07-23 ENCOUNTER — Encounter (HOSPITAL_COMMUNITY): Payer: Self-pay

## 2020-07-23 ENCOUNTER — Ambulatory Visit (HOSPITAL_COMMUNITY)
Admission: EM | Admit: 2020-07-23 | Discharge: 2020-07-23 | Disposition: A | Discharge status: Home / Self Care | Payer: BC Managed Care – PPO

## 2020-07-23 DIAGNOSIS — B372 Candidiasis of skin and nail: Secondary | ICD-10-CM

## 2020-07-23 DIAGNOSIS — B9689 Other specified bacterial agents as the cause of diseases classified elsewhere: Secondary | ICD-10-CM

## 2020-07-23 DIAGNOSIS — N760 Acute vaginitis: Secondary | ICD-10-CM

## 2020-07-23 DIAGNOSIS — L298 Other pruritus: Secondary | ICD-10-CM | POA: Diagnosis not present

## 2020-07-23 LAB — POCT URINALYSIS DIP (DEVICE)
Bilirubin Urine: NEGATIVE
Glucose, UA: NEGATIVE mg/dL
Hgb urine dipstick: NEGATIVE
Ketones, ur: NEGATIVE mg/dL
Leukocytes,Ua: NEGATIVE
Nitrite: NEGATIVE
Protein, ur: NEGATIVE mg/dL
Specific Gravity, Urine: 1.015 (ref 1.005–1.030)
Urobilinogen, UA: 0.2 mg/dL (ref 0.0–1.0)
pH: 5.5 (ref 5.0–8.0)

## 2020-07-23 NOTE — Telephone Encounter (Signed)
RESULTS  Results were reviewed by Dr. Ellison. A letter has been mailed to pt home address. For future reference, letter can be found in Epic. 

## 2020-07-23 NOTE — Discharge Instructions (Addendum)
Reviewed previous charts  Take full dose of medications it can take up to 4 weeks to completely go away  Stay hydrated  Avoid alcohol  Your urine was negative today

## 2020-07-23 NOTE — Telephone Encounter (Signed)
-----   Message from Renato Shin, MD sent at 07/19/2020  3:00 PM EDT ----- please contact patient: Normal--good

## 2020-07-23 NOTE — ED Triage Notes (Signed)
Pt is here with a groin itch that has not went away after his last visit on 07/01/2020, pt states he wants a different med.

## 2020-07-23 NOTE — ED Provider Notes (Signed)
[facility]    CSN: [id number] Arrival date & time: 07/23/20  6063      History   Chief Complaint Chief Complaint  Patient presents with  . Groin itching    HPI Spencer English is a 54 y.o. male.   Pt states that he is having itching to penile area and some into shaft of penis. Was seen and treated a few weeks ago for the same with no change. Pt denies any penile discharge no abd pain. No problems voiding. Pt did state him and his wife have had anal intercourse. Has not taken anything pta      Past Medical History:  Diagnosis Date  . ALLERGIC RHINITIS 04/12/2009  . Atrial fibrillation ([place])   . BACK PAIN, LUMBAR 01/14/2011  . DEEP VENOUS THROMBOPHLEBITIS, LEG, LEFT 05/05/2009  . DM 08/08/2008  . DYSLIPIDEMIA 04/26/2009  . ECZEMA 05/23/2010  . 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 GERD (gastroesophageal reflux disease)   . GOUT 10/24/2010  . Hyperglycemia   . HYPERTENSION 07/21/2007  . 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 Leukopenia   . Long term (current) use of anticoagulants 04/03/2011  . PULMONARY EMBOLISM 05/03/2009  . UNSPECIFIED URINARY CALCULUS 07/04/2010    Patient Active Problem List   Diagnosis Date Noted  . History of pulmonary embolus (PE) 12/17/2018  . Bloating 09/03/2018  . Constipation 08/17/2018  . OSA on CPAP 06/04/2017  . Obese 06/04/2017  . Wellness examination 09/05/2015  . Screening for cancer 05/15/2015  . Moderate mitral regurgitation 06/26/2014  . Screening for prostate cancer 10/10/2013  . Pulmonary edema 02/01/2013  . Hyperthyroidism 01/27/2013  . Moderate to severe pulmonary hypertension ([place]) 01/26/2013  . Syncope 01/25/2013  . Microcytic anemia 01/25/2013  . Thrombocytopenia ([place]) 01/25/2013  . Hypotension due to drugs 01/23/2013  . History of septic arthritis 01/23/2013  . Atrial fibrillation ([place])   . Palpitations 11/03/2012  . Encounter for long-term (current) use of other medications 09/06/2012  . Pre-employment examination  10/11/2011  . Screening examination for infectious disease 06/16/2011  . Encounter for long-term (current) use of anticoagulants 04/03/2011  . Dyspnea 01/31/2011  . BACK PAIN, LUMBAR 01/14/2011  . Gout 10/24/2010  . UNSPECIFIED URINARY CALCULUS 07/04/2010  . ECZEMA 05/23/2010  . EDEMA 10/25/2009  . 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 DVT (deep venous thrombosis) ([place]) 05/05/2009  . FOOT PAIN, LEFT 05/05/2009  . Chronic pulmonary embolism ([place]) 05/03/2009  . DYSLIPIDEMIA 04/26/2009  . Allergic rhinitis 04/12/2009  . Pain in joint, ankle and foot 04/12/2009  . Diabetes ([place]) 08/08/2008  . 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 SEBACEOUS CYST 01/31/2008  . Essential hypertension 07/21/2007    Past Surgical History:  Procedure Laterality Date  . ANTERIOR CRUCIATE LIGAMENT REPAIR  2000   Right  . CYSTECTOMY     back of head  . KNEE ARTHROSCOPY     left knee  . KNEE ARTHROSCOPY  01/03/2013   Procedure: ARTHROSCOPY KNEE;  Surgeon: Yvette Rack., MD;  Location: [facility];  Service: Orthopedics;  Laterality: Left;  Lavage Synovectomy, Removal of loose body  . KNEE ARTHROSCOPY W/ ACL RECONSTRUCTION     right knee       Home Medications    Prior to Admission medications   Medication Sig Start Date End Date Taking? Authorizing Provider  allopurinol (ZYLOPRIM) 300 MG tablet Take 300 mg by mouth as needed (gout flare up).    [provider]  atorvastatin (LIPITOR) 20 MG tablet Take 1 tablet (20 mg  total) by mouth daily. 03/26/18   Renato Shin, MD  azithromycin (ZITHROMAX) 250 MG tablet Take 250 mg by mouth as directed. 05/15/20   [provider]  dapagliflozin propanediol (FARXIGA) 5 MG TABS tablet Take 5 mg by mouth daily before breakfast. 09/02/19   Renato Shin, MD  fluconazole (DIFLUCAN) 150 MG tablet Take 1 tablet (150 mg total) by mouth once a week. 07/01/20   Jaynee Eagles, PA-C  fluticasone (FLONASE) 50 MCG/ACT nasal spray Place 1 spray into both nostrils daily.  allergies    [provider]  furosemide (LASIX) 40 MG tablet TAKE 1 TABLET BY MOUTH EVERY DAY 11/29/18   Renato Shin, MD  glucose blood (CONTOUR NEXT TEST) test strip 1 each by Other route daily. And lancets 1/day 09/02/19   Renato Shin, MD  halobetasol (ULTRAVATE) 0.05 % cream APPLY CREAM TOPICALLY THREE TIMES DAILY AS NEEDED FOR RASH 12/02/18   Renato Shin, MD  losartan (COZAAR) 100 MG tablet TAKE 1 TABLET BY MOUTH ONCE DAILY 03/21/19   Renato Shin, MD  methimazole (TAPAZOLE) 10 MG tablet Take 1 tablet (10 mg total) by mouth daily. 03/23/19   Renato Shin, MD  metoprolol succinate (TOPROL-XL) 25 MG 24 hr tablet TAKE 1 AND 1/2 TABLETS(37.5 MG) BY MOUTH DAILY 06/22/19   Billie Ruddy, MD  ondansetron (ZOFRAN ODT) 4 MG disintegrating tablet Take 1 tablet (4 mg total) by mouth every 6 (six) hours as needed for nausea or vomiting. 12/07/18   Ward, Delice Bison, DO  sitaGLIPtin (JANUVIA) 100 MG tablet Take 1 tablet (100 mg total) by mouth daily. 12/07/18   Renato Shin, MD  spironolactone (ALDACTONE) 25 MG tablet Take 1 tablet (25 mg total) by mouth daily. 12/23/18   Supple, Megan E, RPH-CPP  Verapamil HCl CR 300 MG CP24 Take 1 capsule (300 mg total) by mouth daily. 07/01/20   Jaynee Eagles, PA-C  XARELTO 20 MG TABS tablet TAKE 1 TABLET(20 MG) BY MOUTH DAILY WITH SUPPER 08/29/19   Allred, Jeneen Rinks, MD    Family History Family History  Problem Relation Age of Onset  . Diabetes Mother   . Hypertension Mother   . Hyperlipidemia Mother   . Dementia Mother   . Diabetes Father   . Hypertension Father   . Hyperlipidemia Father   . Heart attack Father   . Heart disease Father   . Prostate cancer Father   . Stomach cancer Neg Hx   . Colon cancer Neg Hx   . Pancreatic cancer Neg Hx   . Esophageal cancer Neg Hx     Social History Social History   Tobacco Use  . Smoking status: Light Tobacco Smoker    Types: Cigars    Last attempt to quit: 07/29/2018    Years since quitting: 1.9  .  Smokeless tobacco: Never Used  Vaping Use  . Vaping Use: Never used  Substance Use Topics  . Alcohol use: Yes    Comment: Beer everyother weekend   . Drug use: No     Allergies   Metformin and Viagra [sildenafil citrate]   Review of Systems Review of Systems  Constitutional: Negative.   Respiratory: Negative.   Cardiovascular: Negative.   Gastrointestinal: Negative.   Genitourinary:       Penile itching external and while voiding   Skin: Negative.      Physical Exam Triage Vital Signs ED Triage Vitals  Enc Vitals Group     BP 07/23/20 1105 (!) 146/89     Pulse Rate  07/23/20 1105 72     Resp 07/23/20 1105 18     Temp 07/23/20 1105 98 F (36.7 C)     Temp Source 07/23/20 1105 Oral     SpO2 07/23/20 1105 100 %     Weight 07/23/20 1103 (!) 275 lb (124.7 kg)     Height --      Head Circumference --      Peak Flow --      Pain Score 07/23/20 1103 0     Pain Loc --      Pain Edu? --      Excl. in [place]? --    No data found.  Updated Vital Signs BP (!) 146/89 (BP Location: Left Arm)   Pulse 72   Temp 98 F (36.7 C) (Oral)   Resp 18   Wt (!) 275 lb (124.7 kg)   SpO2 100%   BMI 37.30 kg/m   Visual Acuity     Physical Exam Cardiovascular:     Rate and Rhythm: Normal rate.  Pulmonary:     Effort: Pulmonary effort is normal.  Abdominal:     General: Abdomen is flat.  Genitourinary:    Penis: Normal.   Skin:    General: Skin is warm.     Capillary Refill: Capillary refill takes less than 2 seconds.     Findings: Rash present.  Neurological:     General: No focal deficit present.     Mental Status: He is alert.      UC Treatments / Results  Labs (all labs ordered are listed, but only abnormal results are displayed) Labs Reviewed  POCT URINALYSIS DIP (DEVICE)    EKG   Radiology No results found.  Procedures Procedures (including critical care time)  Medications Ordered in UC Medications - No data to display  Initial Impression /  Assessment and Plan / UC Course  I have reviewed the triage vital signs and the nursing notes.  Pertinent labs & imaging results that were available during my care of the patient were reviewed by me and considered in my medical decision making (see chart for details).     Reviewed previous charts  Take full dose of medications it can take up to 4 weeks to completely go away  Stay hydrated  Avoid alcohol  Discussed If still has sx may need to be tested for sti pt did not feel that this was something he needed to check  Final Clinical Impressions(s) / UC Diagnoses   Final diagnoses:  BV (bacterial vaginosis)  Skin yeast infection     Discharge Instructions     Reviewed previous charts  Take full dose of medications it can take up to 4 weeks to completely go away  Stay hydrated  Avoid alcohol  Your urine was negative today     ED Prescriptions    None     PDMP not reviewed this encounter.   Marney Setting, NP 07/23/20 1216

## 2020-08-09 ENCOUNTER — Ambulatory Visit (INDEPENDENT_AMBULATORY_CARE_PROVIDER_SITE_OTHER): Payer: BC Managed Care – PPO | Admitting: Physician Assistant

## 2020-08-09 ENCOUNTER — Other Ambulatory Visit: Payer: Self-pay

## 2020-08-09 VITALS — BP 162/100 | HR 84 | Ht 72.0 in | Wt 273.0 lb

## 2020-08-09 DIAGNOSIS — I480 Paroxysmal atrial fibrillation: Secondary | ICD-10-CM | POA: Diagnosis not present

## 2020-08-09 DIAGNOSIS — Z01818 Encounter for other preprocedural examination: Secondary | ICD-10-CM | POA: Diagnosis not present

## 2020-08-09 DIAGNOSIS — I10 Essential (primary) hypertension: Secondary | ICD-10-CM | POA: Diagnosis not present

## 2020-08-09 NOTE — Progress Notes (Signed)
Cardiology Office Note Date:  08/09/2020  Patient ID:  Spencer English, Spencer English Mar 24, 1966, MRN [id number] PCP:  Haywood Pao, MD  Electrophysiologist: Dr. Rayann Heman   Chief Complaint:  pre-op, colonoscopy  History of Present Illness: Spencer English is a 54 y.o. male with history of PAFib/flutter in setting of hypothyroidism, felt to be resolved with treatment of his thyroid at his last visit with Dr. Rayann Heman in June 2015, HTN, HLD, hx of DVT/PE in 2010, DM, OSA w/ CPAP  He saw Dr. Rayann Heman Jun 2015, at that time with resolution of his AF symptoms with treatment of his thyroid, and c/o ED thought secondary to the BB, this was started to be down-titrated and to f/u   I saw Jun 2017, a year later, he was feeling well, but mentioned in the last couple months that he had been getting an aching type chest discomfort, mostly at night somewhat random, non-radiating, lasting about a minute with no associated symptoms.  Thought he may have had it a couple times during the day, but more noted evenings, in bed.  No SOB, no symptoms of PND or orthopnea.  He denied any palpitations, no dizziness, near syncope or syncope.  He denied any bleeding or signs of bleeding.  He inquired about holding his coumadin for a tattoo and is recommended not to pursue it.  His BP was quite elevated at that visit, the patient reported very unusually so and had reported significant deitary sodium load that morning.recently.  He was counseled, planned for a stress test and an echo to f/u on his MR. His echo noted LVEF 55-60%, trivial MR, no WMA, stress test was negative.  His last visit with [place] was with L. Servando Snare Oct 2019.  She noted poor compliance with coumadin clnic, and often calling after hours for med refills.  Mentioned hard to get time off work for appts. He was there with c/o not feeling well, having trouble breathing through his nose, episodes of SOB, anxiety provoking. Planned for chest CTA, updating his echo and changing  to Xarelto. (CT was neg for PE, LVEF was preserved, grade I DD, no significant VHD)  There was some phone notes regarding HTN ooc, after this, no further contact with our office since 2019.  07/01/2020: ER visit needing refill on his verapamil and c/o penile itching. 177/114, reportedly eating out a lot, dietary noncompliance  TODAY He feels well.  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 He denies any symptoms, no CP, palpitations or cardiac awareness.  No SOB , DOE or difficulties with his ADLs, denies exertional intolerances.  Has some chronic knee pain that eventually is his limiter. No dizzy spells, near syncope or syncope. He denies bleeding or signs of bleeding.  He sees his PMD he says regularaly an has labs there.  His Home BP runs 140/80 or so, sometimes higher, reports medicine compliance   RCRI score: zero, 0.4% DUKE: 9.89METS   Past Medical History:  Diagnosis Date  . ALLERGIC RHINITIS 04/12/2009  . Atrial fibrillation ([place])   . BACK PAIN, LUMBAR 01/14/2011  . DEEP VENOUS THROMBOPHLEBITIS, LEG, LEFT 05/05/2009  . DM 08/08/2008  . DYSLIPIDEMIA 04/26/2009  . ECZEMA 05/23/2010  . 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 GERD (gastroesophageal reflux disease)   . GOUT 10/24/2010  . Hyperglycemia   . HYPERTENSION 07/21/2007  . 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 Leukopenia   . Long term (current) use of anticoagulants  04/03/2011  . PULMONARY EMBOLISM 05/03/2009  . UNSPECIFIED URINARY CALCULUS 07/04/2010    Past Surgical History:  Procedure Laterality Date  . ANTERIOR CRUCIATE LIGAMENT REPAIR  2000   Right  . CYSTECTOMY     back of head  . KNEE ARTHROSCOPY     left knee  . KNEE ARTHROSCOPY  01/03/2013   Procedure: ARTHROSCOPY KNEE;  Surgeon: Yvette Rack., MD;  Location: [facility];  Service: Orthopedics;  Laterality: Left;  Lavage Synovectomy, Removal of loose body  . KNEE ARTHROSCOPY W/ ACL RECONSTRUCTION     right knee     Current Outpatient Medications  Medication Sig Dispense Refill  . allopurinol (ZYLOPRIM) 300 MG tablet Take 300 mg by mouth as needed (gout flare up).    Marland Kitchen atorvastatin (LIPITOR) 20 MG tablet Take 1 tablet (20 mg total) by mouth daily. 90 tablet 3  . azithromycin (ZITHROMAX) 250 MG tablet Take 250 mg by mouth as directed.    . dapagliflozin propanediol (FARXIGA) 5 MG TABS tablet Take 5 mg by mouth daily before breakfast. 30 tablet 11  . fluconazole (DIFLUCAN) 150 MG tablet Take 1 tablet (150 mg total) by mouth once a week. 2 tablet 0  . fluticasone (FLONASE) 50 MCG/ACT nasal spray Place 1 spray into both nostrils daily. allergies    . furosemide (LASIX) 40 MG tablet TAKE 1 TABLET BY MOUTH EVERY DAY 30 tablet 2  . glucose blood (CONTOUR NEXT TEST) test strip 1 each by Other route daily. And lancets 1/day 100 each 3  . halobetasol (ULTRAVATE) 0.05 % cream APPLY CREAM TOPICALLY THREE TIMES DAILY AS NEEDED FOR RASH 15 g 0  . losartan (COZAAR) 100 MG tablet TAKE 1 TABLET BY MOUTH ONCE DAILY 30 tablet 11  . methimazole (TAPAZOLE) 10 MG tablet Take 1 tablet (10 mg total) by mouth daily. 90 tablet 3  . metoprolol succinate (TOPROL-XL) 25 MG 24 hr tablet TAKE 1 AND 1/2 TABLETS(37.5 MG) BY MOUTH DAILY 45 tablet 3  . ondansetron (ZOFRAN ODT) 4 MG disintegrating tablet Take 1 tablet (4 mg total) by mouth every 6 (six) hours as needed for nausea or vomiting. 20 tablet 0  . sitaGLIPtin (JANUVIA) 100 MG tablet Take 1 tablet (100 mg total) by mouth daily. 90 tablet 3  . spironolactone (ALDACTONE) 25 MG tablet Take 1 tablet (25 mg total) by mouth daily. 30 tablet 11  . Verapamil HCl CR 300 MG CP24 Take 1 capsule (300 mg total) by mouth daily. 60 capsule 0  . XARELTO 20 MG TABS tablet TAKE 1 TABLET(20 MG) BY MOUTH DAILY WITH SUPPER 90 tablet 1   No current facility-administered medications for this visit.    Allergies:   Metformin and Viagra [sildenafil citrate]   Social History:  The patient  reports  that he has been smoking cigars. He has never used smokeless tobacco. He reports current alcohol use. He reports that he does not use drugs.   Family History:  The patient's family history includes Dementia in his mother; Diabetes in his father and mother; Heart attack in his father; Heart disease in his father; Hyperlipidemia in his father and mother; Hypertension in his father and mother; Prostate cancer in his father.  ROS:  Please see the history of present illness.   All other systems are reviewed and otherwise negative.   PHYSICAL EXAM:  VS:  There were no vitals taken for this visit. BMI: There is no height or weight on file to calculate BMI. Well nourished,  well developed, obese, in no acute distress  HEENT: normocephalic, atraumatic  Neck: no JVD, carotid bruits or masses Cardiac:  RRR; no significant murmurs, no rubs, or gallops Lungs:  CTA b/l, no wheezing, rhonchi or rales  Abd: soft, nontender, obese MS: no deformity or atrophy Ext:  Trace -1+ edema b/l, no skin changes  Skin: warm and dry, no rash Neuro:  No gross deficits appreciated Psych: euthymic mood, full affect   EKG:  Done today and reviewed by myself  SR 84bpm, nonspecific T changes, no significant changes from prior EKGs   10/14/2018: TTE Study Conclusions  - Left ventricle: The cavity size was normal. Systolic function was  normal. The estimated ejection fraction was in the range of 55%  to 60%. Wall motion was normal; there were no regional wall  motion abnormalities. Doppler parameters are consistent with  abnormal left ventricular relaxation (grade 1 diastolic  dysfunction). Doppler parameters are consistent with elevated  mean left atrial filling pressure.  - Aortic valve: There was trivial regurgitation.  - Left atrium: The atrium was mildly dilated.    07/08/16: TTE Study Conclusions - Left ventricle: The cavity size was normal. Systolic function was   normal. The estimated ejection  fraction was in the range of 55%   to 60%. Wall motion was normal; there were no regional wall   motion abnormalities. There was an increased relative   contribution of atrial contraction to ventricular filling.   Doppler parameters are consistent with abnormal left ventricular   relaxation (grade 1 diastolic dysfunction). - Aortic valve: Trileaflet; normal thickness, mildly calcified   leaflets. - Aorta: Aortic root dimension: 42 mm (ED). - Aortic root: The aortic root was mildly dilated. - Mitral valve: There was trivial regurgitation. - Atrial septum: A patent foramen ovale cannot be excluded. - Tricuspid valve: There was mild regurgitation. - Recommendations: Agitated saline contrast study to rule out PFO. Recommendations:  Agitated saline contrast study to rule out PFO.  07/09/16: stress myoview  Nuclear stress EF: 51%.  There was no ST segment deviation noted during stress.  The study is normal.  This is a low risk study.  The left ventricular ejection fraction is mildly decreased (45-54%). Normal exercise nuclear stress test with no evidence of prior infarct or ischemia.  LVEF calculated at 51% but visually appears better.   01/24/13: Echocardiogram Study Conclusions - Left ventricle: The cavity size was moderately dilated. Wall thickness was normal. Systolic function was normal. The estimated ejection fraction was in the range of 50% to 55%. - Mitral valve: Moderate regurgitation. - Left atrium: The atrium was moderately dilated. - Right ventricle: The cavity size was moderately dilated. Systolic function was mildly reduced. - Right atrium: The atrium was moderately dilated. - Tricuspid valve: Moderate regurgitation. - Pulmonary arteries: Systolic pressure was mildly to moderately increased. PA peak pressure: 63mm Hg (S).   Recent Labs: 07/19/2020: BUN 12; Creatinine, Ser 1.38; Potassium 4.1; Sodium 138; TSH 1.18  No results found for requested labs  within last 8760 hours.   CrCl cannot be calculated (Unknown ideal weight.).   Wt Readings from Last 3 Encounters:  07/23/20 (!) 275 lb (124.7 kg)  07/19/20 268 lb (121.6 kg)  06/27/20 280 lb (127 kg)     Other studies reviewed: Additional studies/records reviewed today include: summarized above  ASSESSMENT AND PLAN:  1. PAFib, in the setting of hyperthyroism     No palpitations     CHA2DS2Vasc is at least 2, on  Xarelto  for h/o DVT as well, appropriately dosed     No symptoms to suggest recurrent AF   2. 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  He has been recommended to wear support stockings but does not.  3. OSA/CPAP     Dr. Elsworth Soho, pulmonary     Reports compliance with his CPAP and closely monitored by DOT  4. HTN     A recheck today 158/96, he reports lower at home  4. HLD     He reports labs are monitored and managed with his PMD  5. VHD, mod MR      No significant VHD by last echo, no MR  6. Pre-op     Endoscopy     No cardiac contraindication to procedure planned     OK to hold Xarelto 1-2 days as noted by our pharmacy team    Disposition: BP clinic in 12 mo, he will keep track of his BP and bring his home cuff to the appointment, otherwise will see him back in a year sooner if needed.  He follows with his PMD.  Current medicines are reviewed at length with the patient today.  The patient did not have any concerns regarding medicines.  Haywood Lasso, PA-C 08/09/2020 5:35 AM     [facility] [address] [place]  [address] [phone] (office)  [phone] (fax)

## 2020-08-09 NOTE — Patient Instructions (Signed)
Medication Instructions:   Your physician recommends that you continue on your current medications as directed. Please refer to the Current Medication list given to you today.  *If you need a refill on your cardiac medications before your next appointment, please call your pharmacy*   Lab Work: [place]   If you have labs (blood work) drawn today and your tests are completely normal, you will receive your results only by: Marland Kitchen MyChart Message (if you have MyChart) OR . A paper copy in the mail If you have any lab test that is abnormal or we need to change your treatment, we will call you to review the results.   Testing/Procedures: NONE ORDERED  TODAY    Follow-Up: At [facility], you and your health needs are our priority.  As part of our continuing mission to provide you with exceptional heart care, we have created designated Provider Care Teams.  These Care Teams include your primary Cardiologist (physician) and Advanced Practice Providers (APPs -  Physician Assistants and Nurse Practitioners) who all work together to provide you with the care you need, when you need it.  We recommend signing up for the patient portal called "MyChart".  Sign up information is provided on this After Visit Summary.  MyChart is used to connect with patients for Virtual Visits (Telemedicine).  Patients are able to view lab/test results, encounter notes, upcoming appointments, etc.  Non-urgent messages can be sent to your provider as well.   To learn more about what you can do with MyChart, go to [email or web address].    Your next appointment:    2 month(s) in [facility]   The format for your next appointment:    Your physician wants you to follow-up in: [place]   Provider:    Dr. Rayann Heman  or one of the following Advanced Practice Providers on your designated Care Team:    Chanetta Marshall, NP  Tommye Standard, PA-C  Legrand Como "Oda Kilts, [place]    Other  Instructions

## 2020-08-20 ENCOUNTER — Telehealth: Payer: Self-pay | Admitting: Physician Assistant

## 2020-08-20 NOTE — Telephone Encounter (Signed)
Spoke with patient regarding recommendations on checking BP and taking BP medications as directed, pt advised that we received clearance from cardiology less than 2 weeks ago. Advised that his glucose levels can be checked in the [address], pt states that he does not check his glucose levels because he does not have a glucose monitor. Advised to proceed with procedure as scheduled unless he hears otherwise from our office. Pt advised to arrive at 9:30 am with a care partner that is able to stay with him.

## 2020-08-20 NOTE — Telephone Encounter (Signed)
He should recheck BP when he can but I don't think that BP will prohibit his exam. He should take his present BP medications for that and keep an eye on this at home. He was cleared for endoscopy by his cardiologist less than 2 weeks ago. We can monitor his glucose at the [facility], but is he checking his glucose levels to see where they are running? I think probably still okay to proceed but will make sure anesthesia is aware.  Denice Bors so you are aware, any additional recommendations or questions?. Dr. Woodward Ku patient for tomorrow

## 2020-08-20 NOTE — Telephone Encounter (Signed)
Dr. Havery Moros as DOD 08/20/20 please advise Pt is scheduled for colon tomorrow with Dr. Silverio Decamp has some concerns regarding BP and A1C.  Spoke with patient, he states that she took his BP last night and it was 152/97 with a wrist cuff. Pt states that he is unable to check it this morning as he is at work and had to use his father's cuff last night. Pt also concerned that his A1C was elevated and he has not taken Januvia in about 4 months, is waiting on a prescription now. A1C was 7.3 about 1 month ago, he is scheduled for a colon tomorrow, ok to proceed as scheduled? Please advise.

## 2020-08-21 ENCOUNTER — Encounter: Payer: Self-pay | Admitting: Gastroenterology

## 2020-08-21 ENCOUNTER — Ambulatory Visit (AMBULATORY_SURGERY_CENTER): Payer: BC Managed Care – PPO | Admitting: Gastroenterology

## 2020-08-21 ENCOUNTER — Other Ambulatory Visit: Payer: Self-pay

## 2020-08-21 VITALS — BP 123/81 | HR 65 | Temp 98.9°F | Resp 11 | Ht 72.0 in | Wt 280.0 lb

## 2020-08-21 DIAGNOSIS — Z1211 Encounter for screening for malignant neoplasm of colon: Secondary | ICD-10-CM

## 2020-08-21 DIAGNOSIS — Z1212 Encounter for screening for malignant neoplasm of rectum: Secondary | ICD-10-CM

## 2020-08-21 DIAGNOSIS — K621 Rectal polyp: Secondary | ICD-10-CM | POA: Diagnosis not present

## 2020-08-21 DIAGNOSIS — D128 Benign neoplasm of rectum: Secondary | ICD-10-CM

## 2020-08-21 MED ORDER — SODIUM CHLORIDE 0.9 % IV SOLN: 500.0000 mL | Freq: Once | INTRAVENOUS | Status: DC

## 2020-08-21 NOTE — Patient Instructions (Addendum)
Handouts given for hemorrhoids and polyps.  Resume Xarelto    TODAY 08/21/20  YOU HAD AN ENDOSCOPIC PROCEDURE TODAY AT THE [facility]:   Refer to the procedure report that was given to you for any specific questions about what was found during the examination.  If the procedure report does not answer your questions, please call your gastroenterologist to clarify.  If you requested that your care partner not be given the details of your procedure findings, then the procedure report has been included in a sealed envelope for you to review at your convenience later.  YOU SHOULD EXPECT: Some feelings of bloating in the abdomen. Passage of more gas than usual.  Walking can help get rid of the air that was put into your GI tract during the procedure and reduce the bloating. If you had a lower endoscopy (such as a colonoscopy or flexible sigmoidoscopy) you may notice spotting of blood in your stool or on the toilet paper. If you underwent a bowel prep for your procedure, you may not have a normal bowel movement for a few days.  Please Note:  You might notice some irritation and congestion in your nose or some drainage.  This is from the oxygen used during your procedure.  There is no need for concern and it should clear up in a day or so.  SYMPTOMS TO REPORT IMMEDIATELY:   Following lower endoscopy (colonoscopy or flexible sigmoidoscopy):  Excessive amounts of blood in the stool  Significant tenderness or worsening of abdominal pains  Swelling of the abdomen that is new, acute  Fever of 100F or higher  For urgent or emergent issues, a gastroenterologist can be reached at any hour by calling [phone]. Do not use MyChart messaging for urgent concerns.    DIET:  We do recommend a small meal at first, but then you may proceed to your regular diet.  Drink plenty of fluids but you should avoid alcoholic beverages for 24 hours.  ACTIVITY:  You should plan to take it easy for the rest  of today and you should NOT DRIVE or use heavy machinery until tomorrow (because of the sedation medicines used during the test).    FOLLOW UP: Our staff will call the number listed on your records 48-72 hours following your procedure to check on you and address any questions or concerns that you may have regarding the information given to you following your procedure. If we do not reach you, we will leave a message.  We will attempt to reach you two times.  During this call, we will ask if you have developed any symptoms of COVID 19. If you develop any symptoms (ie: fever, flu-like symptoms, shortness of breath, cough etc.) before then, please call [phone].  If you test positive for Covid 19 in the 2 weeks post procedure, please call and report this information to [place].    If any biopsies were taken you will be contacted by phone or by letter within the next 1-3 weeks.  Please call us at [phone] if you have not heard about the biopsies in 3 weeks.    SIGNATURES/CONFIDENTIALITY: You and/or your care partner have signed paperwork which will be entered into your electronic medical record.  These signatures attest to the fact that that the information above on your After Visit Summary has been reviewed and is understood.  Full responsibility of the confidentiality of this discharge information lies with you and/or your care-partner.

## 2020-08-21 NOTE — Progress Notes (Signed)
Lidocaine buffer  robinol antisialogogue 

## 2020-08-21 NOTE — Progress Notes (Signed)
VS-CW 

## 2020-08-21 NOTE — Progress Notes (Signed)
Called to room to assist during endoscopic procedure.  Patient ID and intended procedure confirmed with present staff. Received instructions for my participation in the procedure from the performing physician.  

## 2020-08-21 NOTE — Progress Notes (Signed)
A and O x3. Report to RN. Tolerated MAC anesthesia well.

## 2020-08-21 NOTE — Telephone Encounter (Signed)
Dr. Havery Moros,  Just saw this note. Since his HTN and diabetes are already being treated, we will be able to manage them.  Thanks,  Osvaldo Angst

## 2020-08-21 NOTE — Op Note (Signed)
[place] Patient Name: Spencer English Procedure Date: 08/21/2020 10:29 AM MRN: [id number] Endoscopist: Mauri Pole , MD Age: 54 Referring MD:  Date of Birth: 06-09-1966 Gender: Male Account #: [id number] Procedure:                Colonoscopy Indications:              Screening for colorectal malignant neoplasm Medicines:                Monitored Anesthesia Care Procedure:                Pre-Anesthesia Assessment:                           - Prior to the procedure, a History and Physical                            was performed, and patient medications and                            allergies were reviewed. The patient's tolerance of                            previous anesthesia was also reviewed. The risks                            and benefits of the procedure and the sedation                            options and risks were discussed with the patient.                            All questions were answered, and informed consent                            was obtained. Prior Anticoagulants: The patient                            last took Xarelto (rivaroxaban) 2 days prior to the                            procedure. ASA Grade Assessment: III - A patient                            with severe systemic disease. After reviewing the                            risks and benefits, the patient was deemed in                            satisfactory condition to undergo the procedure.                           After obtaining informed consent, the colonoscope  was passed under direct vision. Throughout the                            procedure, the patient's blood pressure, pulse, and                            oxygen saturations were monitored continuously. The                            Colonoscope was introduced through the anus and                            advanced to the the cecum, identified by                            appendiceal orifice  and ileocecal valve. The                            colonoscopy was performed without difficulty. The                            patient tolerated the procedure well. The quality                            of the bowel preparation was excellent. The                            ileocecal valve, appendiceal orifice, and rectum                            were photographed. Scope In: 10:34:40 AM Scope Out: 10:47:55 AM Scope Withdrawal Time: 0 hours 10 minutes 37 seconds  Total Procedure Duration: 0 hours 13 minutes 15 seconds  Findings:                 The perianal and digital rectal examinations were                            normal.                           A 1 mm polyp was found in the rectum. The polyp was                            sessile. The polyp was removed with a cold biopsy                            forceps. Resection and retrieval were complete.                           Non-bleeding internal hemorrhoids were found during                            retroflexion. The hemorrhoids were small.  The exam was otherwise without abnormality. Complications:            No immediate complications. Estimated Blood Loss:     Estimated blood loss was minimal. Impression:               - One 1 mm polyp in the rectum, removed with a cold                            biopsy forceps. Resected and retrieved.                           - Non-bleeding internal hemorrhoids.                           - The examination was otherwise normal. Recommendation:           - Patient has a contact number available for                            emergencies. The signs and symptoms of potential                            delayed complications were discussed with the                            patient. Return to normal activities tomorrow.                            Written discharge instructions were provided to the                            patient.                           - Resume  previous diet.                           - Continue present medications.                           - Resume Xarelto (rivaroxaban) at prior dose today.                            Refer to managing physician for further adjustment                            of therapy.                           - Await pathology results.                           - Repeat colonoscopy in 5-10 years for surveillance                            based on pathology results. Mauri Pole, MD 08/21/2020 10:53:53 AM This report has been signed  electronically.

## 2020-08-23 ENCOUNTER — Telehealth: Payer: Self-pay | Admitting: *Deleted

## 2020-08-23 NOTE — Telephone Encounter (Signed)
  Follow up Call-  Call back number 08/21/2020  Post procedure Call Back phone  # [phone]  Permission to leave phone message Yes  Some recent data might be hidden     Patient questions:  Do you have a fever, pain , or abdominal swelling? No. Pain Score  0 *  Have you tolerated food without any problems? Yes.    Have you been able to return to your normal activities? Yes.    Do you have any questions about your discharge instructions: Diet   No. Medications  No. Follow up visit  No.  Do you have questions or concerns about your Care? No.  Actions: * If pain score is 4 or above: No action needed, pain <4.  Have you developed a fever since your procedure? no 2.   Have you had an respiratory symptoms (SOB or cough) since your procedure? no  3.   Have you tested positive for COVID 19 since your procedure no  4.   Have you had any family members/close contacts diagnosed with the COVID 19 since your procedure?  no   If yes to any of these questions please route to Joylene John, RN and Joella Prince, RN

## 2020-08-24 ENCOUNTER — Encounter: Payer: Self-pay | Admitting: Gastroenterology

## 2020-08-30 ENCOUNTER — Telehealth: Payer: Self-pay | Admitting: Gastroenterology

## 2020-08-30 NOTE — Telephone Encounter (Signed)
Read the biopsy letter to the patient.

## 2020-08-30 NOTE — Telephone Encounter (Signed)
Patient is requesting path results 

## 2020-09-12 ENCOUNTER — Encounter (HOSPITAL_COMMUNITY): Payer: Self-pay | Admitting: Emergency Medicine

## 2020-09-12 ENCOUNTER — Ambulatory Visit (HOSPITAL_COMMUNITY)
Admission: EM | Admit: 2020-09-12 | Discharge: 2020-09-12 | Disposition: A | Discharge status: Home / Self Care | Payer: BC Managed Care – PPO | Attending: Family Medicine | Admitting: Family Medicine

## 2020-09-12 ENCOUNTER — Other Ambulatory Visit: Payer: Self-pay

## 2020-09-12 DIAGNOSIS — I4891 Unspecified atrial fibrillation: Secondary | ICD-10-CM

## 2020-09-12 DIAGNOSIS — E782 Mixed hyperlipidemia: Secondary | ICD-10-CM

## 2020-09-12 DIAGNOSIS — I10 Essential (primary) hypertension: Secondary | ICD-10-CM

## 2020-09-12 MED ORDER — VERAPAMIL HCL ER 240 MG PO TBCR: 240.0000 mg | EXTENDED_RELEASE_TABLET | Freq: Every day | ORAL | 1 refills | Status: DC

## 2020-09-12 MED ORDER — ATORVASTATIN CALCIUM 20 MG PO TABS: 20.0000 mg | ORAL_TABLET | Freq: Every day | ORAL | 0 refills | Status: DC

## 2020-09-12 MED ORDER — RIVAROXABAN 20 MG PO TABS: ORAL_TABLET | ORAL | 1 refills | Status: DC

## 2020-09-12 MED ORDER — METOPROLOL SUCCINATE ER 25 MG PO TB24: ORAL_TABLET | ORAL | 1 refills | Status: DC

## 2020-09-12 MED ORDER — FUROSEMIDE 40 MG PO TABS: 40.0000 mg | ORAL_TABLET | Freq: Every day | ORAL | 1 refills | Status: DC

## 2020-09-12 MED ORDER — LOSARTAN POTASSIUM 100 MG PO TABS: 100.0000 mg | ORAL_TABLET | Freq: Every day | ORAL | 1 refills | Status: DC

## 2020-09-12 NOTE — ED Triage Notes (Signed)
Pt states he ran out of his blood pressure medication x 1 week ago. Pt states he is taking Verapamil for blood pressure.

## 2020-09-12 NOTE — ED Provider Notes (Signed)
[place]    CSN: [id number] Arrival date & time: 09/12/20  0932      History   Chief Complaint Chief Complaint  Patient presents with  . Medication Refill  . Hypertension    HPI Spencer English is a 54 y.o. male.   Here today requesting medication refills on multiple medications. States he was dismissed last month from his PCP office due to no-shows and has run out of some of his medicines over a week ago. Home BPs when taking his medication have been 140/70 range. Tolerating all medications well, denies CP, SOB, HAs, dizziness, syncope. Does follow with Endocrinology for DM, recent labs 06/2020. Awaiting new PCP appt in early Nov and needing refills until then.      Past Medical History:  Diagnosis Date  . ALLERGIC RHINITIS 04/12/2009  . Allergy   . Anxiety   . Arthritis   . Atrial fibrillation ([place])   . BACK PAIN, LUMBAR 01/14/2011  . DEEP VENOUS THROMBOPHLEBITIS, LEG, LEFT 05/05/2009  . DM 08/08/2008  . DYSLIPIDEMIA 04/26/2009  . ECZEMA 05/23/2010  . 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 GERD (gastroesophageal reflux disease)   . GOUT 10/24/2010  . Hyperglycemia   . HYPERTENSION 07/21/2007  . 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 Leukopenia   . Long term (current) use of anticoagulants 04/03/2011  . PULMONARY EMBOLISM 05/03/2009  . Sleep apnea    CPAP at bedtime  . Thyroid disease    pt thinks hyperthyroidism  . UNSPECIFIED URINARY CALCULUS     Patient Active Problem List   Diagnosis Date Noted  . History of pulmonary embolus (PE) 12/17/2018  . Bloating 09/03/2018  . Constipation 08/17/2018  . OSA on CPAP 06/04/2017  . Obese 06/04/2017  . Wellness examination 09/05/2015  . Screening for cancer 05/15/2015  . Moderate mitral regurgitation 06/26/2014  . Screening for prostate cancer 10/10/2013  . Pulmonary edema 02/01/2013  . Hyperthyroidism 01/27/2013  . Moderate to severe pulmonary hypertension ([place]) 01/26/2013  . Syncope 01/25/2013  . Microcytic anemia  01/25/2013  . Thrombocytopenia ([place]) 01/25/2013  . Hypotension due to drugs 01/23/2013  . History of septic arthritis 01/23/2013  . Atrial fibrillation ([place])   . Palpitations 11/03/2012  . Encounter for long-term (current) use of other medications 09/06/2012  . Pre-employment examination 10/11/2011  . Screening examination for infectious disease 06/16/2011  . Encounter for long-term (current) use of anticoagulants 04/03/2011  . Dyspnea 01/31/2011  . BACK PAIN, LUMBAR 01/14/2011  . Gout 10/24/2010  . UNSPECIFIED URINARY CALCULUS 07/04/2010  . ECZEMA 05/23/2010  . EDEMA 10/25/2009  . 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 DVT (deep venous thrombosis) ([place]) 05/05/2009  . FOOT PAIN, LEFT 05/05/2009  . Chronic pulmonary embolism ([place]) 05/03/2009  . DYSLIPIDEMIA 04/26/2009  . Allergic rhinitis 04/12/2009  . Pain in joint, ankle and foot 04/12/2009  . Diabetes ([place]) 08/08/2008  . 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 SEBACEOUS CYST 01/31/2008  . Essential hypertension 07/21/2007    Past Surgical History:  Procedure Laterality Date  . ANTERIOR CRUCIATE LIGAMENT REPAIR  2000   Right  . CYSTECTOMY     back of head  . KNEE ARTHROSCOPY     left knee  . KNEE ARTHROSCOPY  01/03/2013   Procedure: ARTHROSCOPY KNEE;  Surgeon: Yvette Rack., MD;  Location: [facility];  Service: Orthopedics;  Laterality: Left;  Lavage Synovectomy, Removal of loose body  . KNEE ARTHROSCOPY W/ ACL RECONSTRUCTION     right knee  Home Medications    Prior to Admission medications   Medication Sig Start Date End Date Taking? Authorizing Provider  allopurinol (ZYLOPRIM) 300 MG tablet Take 300 mg by mouth as needed (gout flare up).   Yes [provider]  dapagliflozin propanediol (FARXIGA) 5 MG TABS tablet Take 5 mg by mouth daily before breakfast. 09/02/19  Yes Renato Shin, MD  fluticasone [facility]) 50 MCG/ACT nasal spray Place 1 spray into both nostrils daily. allergies   Yes [provider]    glucose blood (CONTOUR NEXT TEST) test strip 1 each by Other route daily. And lancets 1/day 09/02/19  Yes Renato Shin, MD  halobetasol (ULTRAVATE) 0.05 % cream APPLY CREAM TOPICALLY THREE TIMES DAILY AS NEEDED FOR RASH 12/02/18  Yes Renato Shin, MD  methimazole (TAPAZOLE) 10 MG tablet Take 1 tablet (10 mg total) by mouth daily. 03/23/19  Yes Renato Shin, MD  sitaGLIPtin (JANUVIA) 100 MG tablet Take 1 tablet (100 mg total) by mouth daily. 12/07/18  Yes Renato Shin, MD  Verapamil HCl CR 300 MG CP24 Take 1 capsule (300 mg total) by mouth daily. 07/01/20  Yes Jaynee Eagles, PA-C  atorvastatin (LIPITOR) 20 MG tablet Take 1 tablet (20 mg total) by mouth daily. 09/12/20   Volney American, PA-C  furosemide (LASIX) 40 MG tablet Take 1 tablet (40 mg total) by mouth daily. 09/12/20   Volney American, PA-C  losartan (COZAAR) 100 MG tablet Take 1 tablet (100 mg total) by mouth daily. 09/12/20   Volney American, PA-C  metoprolol succinate (TOPROL-XL) 25 MG 24 hr tablet TAKE 1 AND 1/2 TABLETS(37.5 MG) BY MOUTH DAILY 09/12/20   Volney American, PA-C  rivaroxaban (XARELTO) 20 MG TABS tablet TAKE 1 TABLET(20 MG) BY MOUTH DAILY WITH SUPPER 09/12/20   Volney American, PA-C  spironolactone (ALDACTONE) 25 MG tablet Take 1 tablet (25 mg total) by mouth daily. Patient not taking: Reported on 08/21/2020 12/23/18   Supple, Megan E, RPH-CPP  verapamil (CALAN-SR) 240 MG CR tablet Take 1 tablet (240 mg total) by mouth at bedtime. 09/12/20   Volney American, PA-C    Family History Family History  Problem Relation Age of Onset  . Diabetes Mother   . Hypertension Mother   . Hyperlipidemia Mother   . Dementia Mother   . Diabetes Father   . Hypertension Father   . Hyperlipidemia Father   . Heart attack Father   . Heart disease Father   . Prostate cancer Father   . Stomach cancer Neg Hx   . Colon cancer Neg Hx   . Pancreatic cancer Neg Hx   . Esophageal cancer Neg Hx   . Rectal  cancer Neg Hx     Social History Social History   Tobacco Use  . Smoking status: Light Tobacco Smoker    Types: Cigars    Last attempt to quit: 07/29/2018    Years since quitting: 2.1  . Smokeless tobacco: Never Used  Vaping Use  . Vaping Use: Never used  Substance Use Topics  . Alcohol use: Yes    Comment: Beer everyother weekend   . Drug use: No     Allergies   Metformin and Viagra [sildenafil citrate]   Review of Systems Review of Systems PER HPI   Physical Exam Triage Vital Signs ED Triage Vitals [09/12/20 0928]  Enc Vitals Group     BP (!) 148/93     Pulse Rate 76     Resp 14  Temp 98.3 F (36.8 C)     Temp Source Oral     SpO2 100 %     Weight      Height      Head Circumference      Peak Flow      Pain Score 0     Pain Loc      Pain Edu?      Excl. in [place]?    No data found.  Updated Vital Signs BP (!) 148/93 (BP Location: Right Arm)   Pulse 76   Temp 98.3 F (36.8 C) (Oral)   Resp 14   SpO2 100%   Visual Acuity Right Eye Distance:   Left Eye Distance:   Bilateral Distance:    Right Eye Near:   Left Eye Near:    Bilateral Near:     Physical Exam Vitals and nursing note reviewed.  Constitutional:      Appearance: Normal appearance.  HENT:     Head: Atraumatic.     Mouth/Throat:     Mouth: Mucous membranes are moist.     Pharynx: Oropharynx is clear.  Eyes:     Extraocular Movements: Extraocular movements intact.     Conjunctiva/sclera: Conjunctivae normal.  Cardiovascular:     Rate and Rhythm: Normal rate and regular rhythm.     Pulses: Normal pulses.     Heart sounds: Normal heart sounds.  Pulmonary:     Effort: Pulmonary effort is normal.     Breath sounds: Normal breath sounds.  Abdominal:     General: Bowel sounds are normal. There is no distension.     Palpations: Abdomen is soft.     Tenderness: There is no abdominal tenderness. There is no guarding.  Musculoskeletal:        General: Normal range of motion.      Cervical back: Normal range of motion and neck supple.  Skin:    General: Skin is warm and dry.  Neurological:     General: No focal deficit present.     Mental Status: He is oriented to person, place, and time.  Psychiatric:        Mood and Affect: Mood normal.        Thought Content: Thought content normal.        Judgment: Judgment normal.      UC Treatments / Results  Labs (all labs ordered are listed, but only abnormal results are displayed) Labs Reviewed - No data to display  EKG   Radiology No results found.  Procedures Procedures (including critical care time)  Medications Ordered in UC Medications - No data to display  Initial Impression / Assessment and Plan / UC Course  I have reviewed the triage vital signs and the nursing notes.  Pertinent labs & imaging results that were available during my care of the patient were reviewed by me and considered in my medical decision making (see chart for details).     Here today for medication refills on his BP and HLD medications, which he has tolerated well and is fairly recent on lab monitoring which has been stable per my chart review. Will bridge him 2 months on the requested medications, have him monitor home vitals and log them regularly. F/u with abnormal results prior to PCP est care visit. Of note, changing from 300 mg verapamil ER to 240 mg verapamil due to cost concerns as he cannot afford the 300 mg and pharmacy told him it would be 10x cheaper  for the other dose. This can be adjusted if needed by PCP in Nov unless readings significantly elevated on new dose.   Final Clinical Impressions(s) / UC Diagnoses   Final diagnoses:  Essential hypertension  Mixed hyperlipidemia   Discharge Instructions   None    ED Prescriptions    Medication Sig Dispense Auth. Provider   furosemide (LASIX) 40 MG tablet Take 1 tablet (40 mg total) by mouth daily. 30 tablet Volney American, [place]   rivaroxaban (XARELTO) 20  MG TABS tablet TAKE 1 TABLET(20 MG) BY MOUTH DAILY WITH SUPPER 30 tablet Volney American, [place]   metoprolol succinate (TOPROL-XL) 25 MG 24 hr tablet TAKE 1 AND 1/2 TABLETS(37.5 MG) BY MOUTH DAILY 45 tablet Volney American, PA-C   losartan (COZAAR) 100 MG tablet Take 1 tablet (100 mg total) by mouth daily. 30 tablet Volney American, PA-C   verapamil (CALAN-SR) 240 MG CR tablet Take 1 tablet (240 mg total) by mouth at bedtime. 30 tablet Volney American, [place]   atorvastatin (LIPITOR) 20 MG tablet Take 1 tablet (20 mg total) by mouth daily. 30 tablet Volney American, [place]     PDMP not reviewed this encounter.   Volney American, [place] 09/12/20 1101

## 2020-09-15 ENCOUNTER — Other Ambulatory Visit: Payer: Self-pay | Admitting: Endocrinology

## 2020-10-05 ENCOUNTER — Ambulatory Visit: Payer: BC Managed Care – PPO

## 2020-10-31 ENCOUNTER — Ambulatory Visit: Payer: BC Managed Care – PPO | Admitting: Endocrinology

## 2020-11-01 ENCOUNTER — Encounter: Payer: Self-pay | Admitting: Family Medicine

## 2020-11-01 ENCOUNTER — Ambulatory Visit: Payer: BC Managed Care – PPO | Admitting: Family Medicine

## 2020-11-01 ENCOUNTER — Other Ambulatory Visit: Payer: Self-pay

## 2020-11-01 VITALS — BP 150/90 | HR 74 | Temp 96.9°F | Ht 72.0 in | Wt 269.5 lb

## 2020-11-01 DIAGNOSIS — E0590 Thyrotoxicosis, unspecified without thyrotoxic crisis or storm: Secondary | ICD-10-CM

## 2020-11-01 DIAGNOSIS — I10 Essential (primary) hypertension: Secondary | ICD-10-CM

## 2020-11-01 DIAGNOSIS — Z9989 Dependence on other enabling machines and devices: Secondary | ICD-10-CM

## 2020-11-01 DIAGNOSIS — I2782 Chronic pulmonary embolism: Secondary | ICD-10-CM | POA: Diagnosis not present

## 2020-11-01 DIAGNOSIS — E1169 Type 2 diabetes mellitus with other specified complication: Secondary | ICD-10-CM | POA: Diagnosis not present

## 2020-11-01 DIAGNOSIS — G4733 Obstructive sleep apnea (adult) (pediatric): Secondary | ICD-10-CM

## 2020-11-01 DIAGNOSIS — E785 Hyperlipidemia, unspecified: Secondary | ICD-10-CM

## 2020-11-01 DIAGNOSIS — I4891 Unspecified atrial fibrillation: Secondary | ICD-10-CM | POA: Diagnosis not present

## 2020-11-01 MED ORDER — LOSARTAN POTASSIUM 100 MG PO TABS: 100.0000 mg | ORAL_TABLET | Freq: Every day | ORAL | 3 refills | Status: DC

## 2020-11-01 MED ORDER — RIVAROXABAN 20 MG PO TABS: ORAL_TABLET | ORAL | 1 refills | Status: DC

## 2020-11-01 MED ORDER — METOPROLOL SUCCINATE ER 50 MG PO TB24: 50.0000 mg | ORAL_TABLET | Freq: Every day | ORAL | 3 refills | Status: DC

## 2020-11-01 MED ORDER — METHIMAZOLE 10 MG PO TABS: 10.0000 mg | ORAL_TABLET | Freq: Every day | ORAL | 1 refills | Status: DC

## 2020-11-01 MED ORDER — FUROSEMIDE 40 MG PO TABS: 40.0000 mg | ORAL_TABLET | Freq: Every day | ORAL | 3 refills | Status: DC

## 2020-11-01 MED ORDER — VERAPAMIL HCL ER 240 MG PO TBCR: 240.0000 mg | EXTENDED_RELEASE_TABLET | Freq: Every day | ORAL | 3 refills | Status: DC

## 2020-11-01 MED ORDER — DAPAGLIFLOZIN PROPANEDIOL 5 MG PO TABS: 5.0000 mg | ORAL_TABLET | Freq: Every day | ORAL | 11 refills | Status: DC

## 2020-11-01 MED ORDER — SPIRONOLACTONE 25 MG PO TABS: 25.0000 mg | ORAL_TABLET | Freq: Every day | ORAL | 11 refills | Status: DC

## 2020-11-01 MED ORDER — ATORVASTATIN CALCIUM 20 MG PO TABS: 20.0000 mg | ORAL_TABLET | Freq: Every day | ORAL | 3 refills | Status: DC

## 2020-11-01 MED ORDER — METOPROLOL SUCCINATE ER 25 MG PO TB24: 37.5000 mg | ORAL_TABLET | Freq: Every day | ORAL | 3 refills | Status: DC

## 2020-11-01 NOTE — Assessment & Plan Note (Signed)
Elevated. Suspect some adherence issues. Cont Verapamil 240 mg, losartan 100 mg, furosemide 40 mg. Increase Metoprolol 37.5 > 50 mg daily. Thinks he developed breast buds with spironolactone. Labs in 1 month

## 2020-11-01 NOTE — Assessment & Plan Note (Signed)
Cont CPAP reports doing well

## 2020-11-01 NOTE — Progress Notes (Signed)
Subjective:     Spencer English is a 53 y.o. male presenting for Establish Care (needs meds refilled)     HPI  #HTN - checks at home 150/90 - taking verapamil, metoprolol, losartan, furosemide, spironolactone - forgets medication 1-2 times per week - salads, grilled food, baked food  Follows with endocrinology for diabetes and thyroid   Review of Systems   Social History   Tobacco Use  Smoking Status Light Tobacco Smoker  . Types: Cigars  . Last attempt to quit: 07/29/2018  . Years since quitting: 2.2  Smokeless Tobacco Never Used        Objective:    BP Readings from Last 3 Encounters:  11/01/20 (!) 150/90  09/12/20 (!) 148/93  08/21/20 123/81   Wt Readings from Last 3 Encounters:  11/01/20 269 lb 8 oz (122.2 kg)  08/21/20 280 lb (127 kg)  08/09/20 273 lb (123.8 kg)    BP (!) 150/90   Pulse 74   Temp (!) 96.9 F (36.1 C) (Temporal)   Ht 6' (1.829 m)   Wt 269 lb 8 oz (122.2 kg)   SpO2 99%   BMI 36.55 kg/m    Physical Exam Constitutional:      Appearance: Normal appearance. He is not ill-appearing or diaphoretic.  HENT:     Right Ear: External ear normal.     Left Ear: External ear normal.     Nose: Nose normal.  Eyes:     General: No scleral icterus.    Extraocular Movements: Extraocular movements intact.     Conjunctiva/sclera: Conjunctivae normal.  Cardiovascular:     Rate and Rhythm: Normal rate and regular rhythm.     Heart sounds: Murmur heard.   Pulmonary:     Effort: Pulmonary effort is normal. No respiratory distress.     Breath sounds: Normal breath sounds. No wheezing.  Musculoskeletal:     Cervical back: Neck supple.  Skin:    General: Skin is warm and dry.  Neurological:     Mental Status: He is alert. Mental status is at baseline.  Psychiatric:        Mood and Affect: Mood normal.           Assessment & Plan:   Problem List Items Addressed This Visit      Cardiovascular and Mediastinum   Essential hypertension     Elevated. Suspect some adherence issues. Cont Verapamil 240 mg, losartan 100 mg, furosemide 40 mg. Increase Metoprolol 37.5 > 50 mg daily. Thinks he developed breast buds with spironolactone. Labs in 1 month      Relevant Medications   atorvastatin (LIPITOR) 20 MG tablet   furosemide (LASIX) 40 MG tablet   losartan (COZAAR) 100 MG tablet   rivaroxaban (XARELTO) 20 MG TABS tablet   verapamil (CALAN-SR) 240 MG CR tablet   metoprolol succinate (TOPROL-XL) 50 MG 24 hr tablet   Chronic pulmonary embolism (HCC) - Primary   Relevant Medications   atorvastatin (LIPITOR) 20 MG tablet   furosemide (LASIX) 40 MG tablet   losartan (COZAAR) 100 MG tablet   rivaroxaban (XARELTO) 20 MG TABS tablet   verapamil (CALAN-SR) 240 MG CR tablet   metoprolol succinate (TOPROL-XL) 50 MG 24 hr tablet   Atrial fibrillation (HCC)    Stable. Cont xarelto 20 mg.       Relevant Medications   atorvastatin (LIPITOR) 20 MG tablet   furosemide (LASIX) 40 MG tablet   losartan (COZAAR) 100 MG tablet   rivaroxaban (  XARELTO) 20 MG TABS tablet   verapamil (CALAN-SR) 240 MG CR tablet   metoprolol succinate (TOPROL-XL) 50 MG 24 hr tablet     Respiratory   OSA on CPAP    Cont CPAP reports doing well        Endocrine   Type 2 diabetes mellitus with other specified complication [facility])    Lab Results  Component Value Date   HGBA1C 7.3 (A) 07/19/2020   Follows with Dr. Loanne Drilling. Did not tolerate metformin. Cont Farxiga 5 mg and januvia 100 mg. Appreciate endocrine support       Relevant Medications   atorvastatin (LIPITOR) 20 MG tablet   dapagliflozin propanediol (FARXIGA) 5 MG TABS tablet   losartan (COZAAR) 100 MG tablet   Hyperthyroidism    Reports he missed his endocrine appt and has f/u in December. Sees Dr. Loanne Drilling. Refill for 2 months provided.  Lab Results  Component Value Date   TSH 1.18 07/19/2020         Relevant Medications   methimazole (TAPAZOLE) 10 MG tablet   metoprolol succinate  (TOPROL-XL) 50 MG 24 hr tablet   Hyperlipidemia associated with type 2 diabetes mellitus ([place])    Lab Results  Component Value Date   CHOL 206 (H) 03/25/2018   HDL 46.30 03/25/2018   LDLCALC 143 (H) 03/25/2018   LDLDIRECT 147.2 10/10/2013   TRIG 86.0 03/25/2018   CHOLHDL 4 03/25/2018   LDL not at goal. Will repeat labs with next lab draw and consider dose increase then. Cont lipitor 20 mg      Relevant Medications   atorvastatin (LIPITOR) 20 MG tablet   dapagliflozin propanediol (FARXIGA) 5 MG TABS tablet   losartan (COZAAR) 100 MG tablet       Return in about 4 weeks (around 11/29/2020).  Lesleigh Noe, MD  This visit occurred during the SARS-CoV-2 public health emergency.  Safety protocols were in place, including screening questions prior to the visit, additional usage of staff PPE, and extensive cleaning of exam room while observing appropriate contact time as indicated for disinfecting solutions.

## 2020-11-01 NOTE — Assessment & Plan Note (Signed)
Lab Results  Component Value Date   CHOL 206 (H) 03/25/2018   HDL 46.30 03/25/2018   LDLCALC 143 (H) 03/25/2018   LDLDIRECT 147.2 10/10/2013   TRIG 86.0 03/25/2018   CHOLHDL 4 03/25/2018   LDL not at goal. Will repeat labs with next lab draw and consider dose increase then. Cont lipitor 20 mg

## 2020-11-01 NOTE — Assessment & Plan Note (Signed)
Lab Results  Component Value Date   HGBA1C 7.3 (A) 07/19/2020   Follows with Dr. Loanne Drilling. Did not tolerate metformin. Cont Farxiga 5 mg and januvia 100 mg. Appreciate endocrine support

## 2020-11-01 NOTE — Patient Instructions (Signed)
New medication: Spironolactone    Return in 4 weeks

## 2020-11-01 NOTE — Assessment & Plan Note (Signed)
Reports he missed his endocrine appt and has f/u in December. Sees Dr. Loanne Drilling. Refill for 2 months provided.  Lab Results  Component Value Date   TSH 1.18 07/19/2020

## 2020-11-01 NOTE — Assessment & Plan Note (Signed)
Stable. Cont xarelto 20 mg.

## 2020-11-20 ENCOUNTER — Other Ambulatory Visit: Payer: Self-pay | Admitting: Family Medicine

## 2020-11-20 DIAGNOSIS — I10 Essential (primary) hypertension: Secondary | ICD-10-CM

## 2020-11-29 ENCOUNTER — Ambulatory Visit: Payer: BC Managed Care – PPO | Admitting: Endocrinology

## 2020-12-04 ENCOUNTER — Telehealth: Payer: Self-pay | Admitting: Family Medicine

## 2020-12-04 ENCOUNTER — Ambulatory Visit: Payer: BC Managed Care – PPO | Admitting: Family Medicine

## 2020-12-04 DIAGNOSIS — Z0289 Encounter for other administrative examinations: Secondary | ICD-10-CM

## 2020-12-04 NOTE — Telephone Encounter (Signed)
Pt called in to cancel his apt due to he had to stay at work and wanted to know the 50$ charge.

## 2020-12-04 NOTE — Telephone Encounter (Signed)
He rescheduled for 12/18/20

## 2020-12-04 NOTE — Telephone Encounter (Signed)
Is able to do a virtual visit? Or I can see him at 4:20 if he could make that time

## 2020-12-18 ENCOUNTER — Ambulatory Visit: Payer: BC Managed Care – PPO | Admitting: Family Medicine

## 2020-12-18 ENCOUNTER — Encounter: Payer: Self-pay | Admitting: Family Medicine

## 2020-12-18 ENCOUNTER — Other Ambulatory Visit: Payer: Self-pay

## 2020-12-18 VITALS — BP 160/100 | HR 68 | Temp 97.7°F | Ht 72.0 in | Wt 270.0 lb

## 2020-12-18 DIAGNOSIS — E785 Hyperlipidemia, unspecified: Secondary | ICD-10-CM | POA: Diagnosis not present

## 2020-12-18 DIAGNOSIS — E1169 Type 2 diabetes mellitus with other specified complication: Secondary | ICD-10-CM

## 2020-12-18 DIAGNOSIS — I10 Essential (primary) hypertension: Secondary | ICD-10-CM

## 2020-12-18 DIAGNOSIS — K429 Umbilical hernia without obstruction or gangrene: Secondary | ICD-10-CM

## 2020-12-18 DIAGNOSIS — I4891 Unspecified atrial fibrillation: Secondary | ICD-10-CM | POA: Diagnosis not present

## 2020-12-18 MED ORDER — METOPROLOL SUCCINATE ER 50 MG PO TB24: 50.0000 mg | ORAL_TABLET | Freq: Every day | ORAL | 1 refills | Status: DC

## 2020-12-18 MED ORDER — SITAGLIPTIN PHOSPHATE 100 MG PO TABS: 100.0000 mg | ORAL_TABLET | Freq: Every day | ORAL | 3 refills | Status: DC

## 2020-12-18 NOTE — Assessment & Plan Note (Signed)
Encouraged endo follow-up. Will recheck hgba1c today and reassess. Cont [place]

## 2020-12-18 NOTE — Patient Instructions (Addendum)
Call back in 2 weeks with home monitoring  Please check your blood pressure 2-4 times a week.   To check your blood pressure 1) Sit in a quiet and relaxed place for 5 minutes 2) Make sure your feet are flat on the ground 3) Consider checking first thing in the morning   Normal blood pressure is less than 140/90 Ideally you blood pressure should be around 120/80  Other ways you can reduce your blood pressure:  1) Regular exercise -- Try to get 150 minutes (30 minutes, 5 days a week) of moderate to vigorous aerobic excercise -- Examples: brisk walking (2.5 miles per hour), water aerobics, dancing, gardening, tennis, biking slower than 10 miles per hour 2) DASH Diet - low fat meats, more fresh fruits and vegetables, whole grains, low salt 3) Quit smoking if you smoke 4) Loose 5-10% of your body weight  #Referral I have placed a referral to a specialist for you. You should receive a phone call from the specialty office. Make sure your voicemail is not full and that if you are able to answer your phone to unknown or new numbers.   It may take up to 2 weeks to hear about the referral. If you do not hear anything in 2 weeks, please call our office and ask to speak with the referral coordinator.

## 2020-12-18 NOTE — Progress Notes (Signed)
Subjective:     Spencer English is a 54 y.o. male presenting for Follow-up     HPI   #HTN - has been out of verapamil - and also never increased the metoprolol  - no cp, sob, ha, vision changes  #Diabetes - has not scheduled f/u with dr Everardo All - is planning to wait until the new year   Review of Systems   Social History   Tobacco Use  Smoking Status Light Tobacco Smoker  . Types: Cigars  . Last attempt to quit: 07/29/2018  . Years since quitting: 2.3  Smokeless Tobacco Never Used        Objective:    BP Readings from Last 3 Encounters:  12/18/20 (!) 160/100  11/01/20 (!) 150/90  09/12/20 (!) 148/93   Wt Readings from Last 3 Encounters:  12/18/20 270 lb (122.5 kg)  11/01/20 269 lb 8 oz (122.2 kg)  08/21/20 280 lb (127 kg)    BP (!) 160/100   Pulse 68   Temp 97.7 F (36.5 C) (Temporal)   Ht 6' (1.829 m)   Wt 270 lb (122.5 kg)   SpO2 99%   BMI 36.62 kg/m    Physical Exam Constitutional:      Appearance: Normal appearance. He is not ill-appearing or diaphoretic.  HENT:     Right Ear: External ear normal.     Left Ear: External ear normal.     Nose: Nose normal.  Eyes:     General: No scleral icterus.    Extraocular Movements: Extraocular movements intact.     Conjunctiva/sclera: Conjunctivae normal.  Cardiovascular:     Rate and Rhythm: Normal rate and regular rhythm.     Heart sounds: No murmur heard.   Pulmonary:     Effort: Pulmonary effort is normal. No respiratory distress.     Breath sounds: Normal breath sounds. No wheezing.  Abdominal:     Hernia: A hernia is present. Hernia is present in the umbilical area.  Musculoskeletal:     Cervical back: Neck supple.  Skin:    General: Skin is warm and dry.  Neurological:     Mental Status: He is alert. Mental status is at baseline.  Psychiatric:        Mood and Affect: Mood normal.           Assessment & Plan:   Problem List Items Addressed This Visit      Cardiovascular  and Mediastinum   Essential hypertension    Suspect adherence is a major contributor to his poor control, but concerned for possible need for additional medication. Discussed referral to HTN clinic and he will go. Refill medication today. Advised home monitoring and call in 2 weeks once he has been all his medications.       Relevant Medications   metoprolol succinate (TOPROL-XL) 50 MG 24 hr tablet   Other Relevant Orders   Ambulatory referral to [facility] [address]   Comprehensive metabolic panel   Atrial fibrillation (HCC)   Relevant Medications   metoprolol succinate (TOPROL-XL) 50 MG 24 hr tablet     Endocrine   Type 2 diabetes mellitus with other specified complication (HCC) - Primary    Encouraged endo follow-up. Will recheck hgba1c today and reassess. Cont [place]      Relevant Medications   sitaGLIPtin (JANUVIA) 100 MG tablet   Other Relevant Orders   Hemoglobin A1c   Hyperlipidemia associated with type 2 diabetes mellitus (  [place])   Relevant Medications   sitaGLIPtin (JANUVIA) 100 MG tablet   Other Relevant Orders   Lipid panel     Other   Umbilical hernia without obstruction and without gangrene    Occasionally has difficulty reducing the hernia. And with intermittent pain. Discussed ER precautions and referral for treatment placed      Relevant Orders   Ambulatory referral to General Surgery       Return in about 2 weeks (around 01/01/2021) for update via phone.  Lesleigh Noe, MD  This visit occurred during the SARS-CoV-2 public health emergency.  Safety protocols were in place, including screening questions prior to the visit, additional usage of staff PPE, and extensive cleaning of exam room while observing appropriate contact time as indicated for disinfecting solutions.

## 2020-12-18 NOTE — Assessment & Plan Note (Signed)
Suspect adherence is a major contributor to his poor control, but concerned for possible need for additional medication. Discussed referral to HTN clinic and he will go. Refill medication today. Advised home monitoring and call in 2 weeks once he has been all his medications.

## 2020-12-18 NOTE — Assessment & Plan Note (Addendum)
Occasionally has difficulty reducing the hernia. And with intermittent pain. Discussed ER precautions and referral for treatment placed

## 2020-12-19 LAB — LIPID PANEL
Cholesterol: 205 mg/dL — ABNORMAL HIGH (ref 0–200)
HDL: 49 mg/dL (ref >39.00)
LDL Cholesterol: 143 mg/dL — ABNORMAL HIGH (ref 0–99)
NonHDL: 155.74
Total CHOL/HDL Ratio: 4
Triglycerides: 63 mg/dL (ref 0.0–149.0)
VLDL: 12.6 mg/dL (ref 0.0–40.0)

## 2020-12-19 LAB — COMPREHENSIVE METABOLIC PANEL
ALT: 18 U/L (ref 0–53)
AST: 17 U/L (ref 0–37)
Albumin: 4.2 g/dL (ref 3.5–5.2)
Alkaline Phosphatase: 71 U/L (ref 39–117)
BUN: 11 mg/dL (ref 6–23)
CO2: 31 mEq/L (ref 19–32)
Calcium: 9.4 mg/dL (ref 8.4–10.5)
Chloride: 102 mEq/L (ref 96–112)
Creatinine, Ser: 1.38 mg/dL (ref 0.40–1.50)
GFR: 57.59 mL/min — ABNORMAL LOW (ref >60.00)
Glucose, Bld: 102 mg/dL — ABNORMAL HIGH (ref 70–99)
Potassium: 4.7 mEq/L (ref 3.5–5.1)
Sodium: 138 mEq/L (ref 135–145)
Total Bilirubin: 0.8 mg/dL (ref 0.2–1.2)
Total Protein: 7.9 g/dL (ref 6.0–8.3)

## 2020-12-19 LAB — HEMOGLOBIN A1C: Hgb A1c MFr Bld: 7.4 % — ABNORMAL HIGH (ref 4.6–6.5)

## 2020-12-20 ENCOUNTER — Other Ambulatory Visit: Payer: Self-pay | Admitting: Family Medicine

## 2020-12-20 DIAGNOSIS — E1169 Type 2 diabetes mellitus with other specified complication: Secondary | ICD-10-CM

## 2020-12-20 DIAGNOSIS — E785 Hyperlipidemia, unspecified: Secondary | ICD-10-CM

## 2020-12-20 MED ORDER — ATORVASTATIN CALCIUM 40 MG PO TABS: 40.0000 mg | ORAL_TABLET | Freq: Every day | ORAL | 3 refills | Status: DC

## 2021-01-07 DIAGNOSIS — Z1152 Encounter for screening for COVID-19: Secondary | ICD-10-CM | POA: Diagnosis not present

## 2021-01-10 ENCOUNTER — Telehealth: Payer: BC Managed Care – PPO | Admitting: Family Medicine

## 2021-01-14 ENCOUNTER — Other Ambulatory Visit: Payer: Self-pay | Admitting: Family Medicine

## 2021-01-14 DIAGNOSIS — I2782 Chronic pulmonary embolism: Secondary | ICD-10-CM

## 2021-01-14 DIAGNOSIS — I4891 Unspecified atrial fibrillation: Secondary | ICD-10-CM

## 2021-01-23 ENCOUNTER — Telehealth: Payer: Self-pay

## 2021-01-23 NOTE — Telephone Encounter (Signed)
Spoke with patient.  Patient was scheduled by accident for a phone visit tomorrow at 8am to follow up on bp.  Dr. Einar Pheasant states this was a misunderstanding, she actually meant for patient to call us in 2 weeks to report how his blood pressure is running.    Patient verbalizes understanding.  He states that he has a cuff (wrist - not sure how accurate) at home and will take some readings over the next couple of days and call us back with a report.  I told him to let us know if he is unsure of accuracy.  He may need to invest in a good bp cuff or plan on coming by the office for a blood pressure check if he is unsure.   FYI to Dr. Einar Pheasant.  Patients appt in am has been cancelled.

## 2021-01-24 ENCOUNTER — Ambulatory Visit: Payer: BC Managed Care – PPO | Admitting: Family Medicine

## 2021-01-24 NOTE — Telephone Encounter (Signed)
Noted. Will route to MA to reach out to patient Monday for home monitoring

## 2021-01-25 ENCOUNTER — Other Ambulatory Visit: Payer: Self-pay | Admitting: Family Medicine

## 2021-01-25 DIAGNOSIS — I10 Essential (primary) hypertension: Secondary | ICD-10-CM

## 2021-01-26 NOTE — Telephone Encounter (Signed)
Routed to [facility] at [place] does not do refills for this practice

## 2021-01-28 DIAGNOSIS — Z20828 Contact with and (suspected) exposure to other viral communicable diseases: Secondary | ICD-10-CM | POA: Diagnosis not present

## 2021-01-29 NOTE — Telephone Encounter (Signed)
Is he on this continuously? I didn't see any notes for it.

## 2021-01-29 NOTE — Telephone Encounter (Signed)
Please call patient to verify how often he is taking lasix.   Also follow-up to see if he has heard from the advanced HTN clinic  Also see if he has done home monitoring and get readings.

## 2021-01-30 NOTE — Telephone Encounter (Signed)
Pt states he takes lasix every day. Refill sent in.    He has heard from the HTN clinic and has an appt scheduled in March.   Pt was driving at time of call, so he said that his BP readings have been 160/90 for the most part. He can't get the diastolic lower.

## 2021-02-20 ENCOUNTER — Telehealth: Payer: Self-pay | Admitting: Family Medicine

## 2021-02-20 DIAGNOSIS — M109 Gout, unspecified: Secondary | ICD-10-CM

## 2021-02-20 NOTE — Telephone Encounter (Signed)
Allopurinol is on his med list. Last filled by historical provider.

## 2021-02-20 NOTE — Telephone Encounter (Signed)
Pt called in wanted to know abouit getting a prescription for gout hes having a flare up in his elbow.

## 2021-02-20 NOTE — Telephone Encounter (Signed)
What medication does he typically take?   He should probably be on allopurinol daily to suppress.

## 2021-02-21 MED ORDER — PREDNISONE 20 MG PO TABS: ORAL_TABLET | ORAL | 0 refills | Status: AC

## 2021-02-21 NOTE — Addendum Note (Signed)
Addended by: Waunita Schooner R on: 02/21/2021 01:09 PM   Modules accepted: Orders

## 2021-02-21 NOTE — Telephone Encounter (Signed)
Will send in prednisone.   Recommend not taking any anti-inflammatory medications (Ibuprofen, naproxen).   May raise blood pressure and blood sugar levels.   Make a follow-up appointment next week if symptoms not improving.

## 2021-02-21 NOTE — Telephone Encounter (Signed)
Attempted to reach pt and relay Dr. Verda Cumins message but pt did not answer and his VM box is full.

## 2021-02-21 NOTE — Telephone Encounter (Signed)
Spoke to pt and he states that all he has ever been given is allopurinol and he only has flare ups once every year, if that. He is open to anything that will help his pain now as he can barely bend his elbow. Please advise.

## 2021-02-22 NOTE — Telephone Encounter (Signed)
He has an appointment next week at the HTN clinic. I'm not sure how to prescribe a blood pressure cuff, but the people at that clinic will likely be able to help him.   Sometimes you can check with insurance and they can send specific paperwork to our office.

## 2021-02-22 NOTE — Telephone Encounter (Signed)
Called and informed patient of Dr. Verda Cumins instructions. Patient verbalized understanding. Patient also wanted to know if he could get a prescription for a blood pressure cuff. Please advise.

## 2021-02-26 NOTE — Telephone Encounter (Signed)
Patient needs appointment. I have a 340 on Thursday or can schedule with any provider tomorrow or Thursday

## 2021-02-26 NOTE — Telephone Encounter (Signed)
Pt called in wanted to let Dr. Einar Pheasant know that the prednisone is not working and wanted to know about changing the medication colchicine

## 2021-02-26 NOTE — Telephone Encounter (Signed)
Patient left another voicemail stating that he is having a gout flare-up and the prednisone is not helping. Patient wants to know if you will prescribe some colchicine.  [place]

## 2021-02-27 ENCOUNTER — Ambulatory Visit: Payer: BC Managed Care – PPO | Admitting: Cardiovascular Disease

## 2021-02-27 NOTE — Telephone Encounter (Signed)
Called patient to schedule appointment. 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 Patient is wondering if medicine can be called in and then make a follow up appointment. Please advise.

## 2021-02-27 NOTE — Telephone Encounter (Signed)
Would recommend he go to urgent for evaluation since not responding to initial treatment. Since he never had a visit here he needs to be evaluated.

## 2021-02-28 NOTE — Telephone Encounter (Signed)
Attempted to call pt and relay Dr. Verda Cumins recommendation. Pt did not answer and his VM box is full.

## 2021-02-28 NOTE — Telephone Encounter (Signed)
Spoke to pt and relayed Dr. Verda Cumins recommendation. Pt is not happy that a medication can't be called in. 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

## 2021-03-15 ENCOUNTER — Other Ambulatory Visit: Payer: Self-pay | Admitting: Family Medicine

## 2021-03-15 DIAGNOSIS — I2782 Chronic pulmonary embolism: Secondary | ICD-10-CM

## 2021-03-15 DIAGNOSIS — I4891 Unspecified atrial fibrillation: Secondary | ICD-10-CM

## 2021-03-15 NOTE — Telephone Encounter (Signed)
Pharmacy requests refill on: Xarelto 20 mg   LAST REFILL: 01/15/2021 (Q-90, R-0) LAST OV: 12/18/2020 NEXT OV: Not Scheduled  PHARMACY: [facility] [place], [place]  Earliest [place] Date: 04/15/2021

## 2021-03-17 ENCOUNTER — Ambulatory Visit (HOSPITAL_COMMUNITY)
Admission: EM | Admit: 2021-03-17 | Discharge: 2021-03-17 | Disposition: A | Discharge status: Home / Self Care | Payer: BC Managed Care – PPO | Attending: Urgent Care | Admitting: Urgent Care

## 2021-03-17 ENCOUNTER — Encounter (HOSPITAL_COMMUNITY): Payer: Self-pay

## 2021-03-17 ENCOUNTER — Other Ambulatory Visit: Payer: Self-pay

## 2021-03-17 DIAGNOSIS — M545 Low back pain, unspecified: Secondary | ICD-10-CM | POA: Diagnosis not present

## 2021-03-17 DIAGNOSIS — S39012A Strain of muscle, fascia and tendon of lower back, initial encounter: Secondary | ICD-10-CM

## 2021-03-17 MED ORDER — TIZANIDINE HCL 4 MG PO TABS: 4.0000 mg | ORAL_TABLET | Freq: Three times a day (TID) | ORAL | 0 refills | Status: DC | PRN

## 2021-03-17 NOTE — ED Triage Notes (Signed)
Pt presents with back pain on left side X 5 days.

## 2021-03-17 NOTE — Discharge Instructions (Signed)
Please just use Tylenol at a dose of 500mg -650mg  once every 6 hours as needed for your aches, pains, fevers. Use tizanidine with this. Do not use any nonsteroidal anti-inflammatories (NSAIDs) like ibuprofen, Motrin, naproxen, Aleve, etc. which are all available over-the-counter.

## 2021-03-17 NOTE — ED Provider Notes (Signed)
[place]   MRN: [id number] DOB: May 03, 1966  Subjective:   Spencer English is a 55 y.o. male presenting for 5-day history of left-sided back pain.  Patient states that he works as a Geophysicist/field seismologist and does a lot of lifting for his work.  States that he lifts very heavy items and sometimes has to lift them off the ground at other times from overhead.  He was doing strenuous work activities earlier this week and thinks he hurt himself from that point on.  Denies any fall, trauma, car accident.  No numbness or tingling, weakness, dysuria, hematuria, history of kidney stones.  No current facility-administered medications for this encounter.  Current Outpatient Medications:  .  allopurinol (ZYLOPRIM) 300 MG tablet, Take 300 mg by mouth as needed (gout flare up)., Disp: , Rfl:  .  atorvastatin (LIPITOR) 40 MG tablet, Take 1 tablet (40 mg total) by mouth daily., Disp: 90 tablet, Rfl: 3 .  dapagliflozin propanediol (FARXIGA) 5 MG TABS tablet, Take 1 tablet (5 mg total) by mouth daily., Disp: 30 tablet, Rfl: 11 .  fluticasone (FLONASE) 50 MCG/ACT nasal spray, Place 1 spray into both nostrils daily. allergies, Disp: , Rfl:  .  furosemide (LASIX) 40 MG tablet, TAKE 1 TABLET(40 MG) BY MOUTH DAILY, Disp: 90 tablet, Rfl: 1 .  glucose blood (CONTOUR NEXT TEST) test strip, 1 each by Other route daily. And lancets 1/day, Disp: 100 each, Rfl: 3 .  halobetasol (ULTRAVATE) 0.05 % cream, APPLY CREAM TOPICALLY THREE TIMES DAILY AS NEEDED FOR RASH, Disp: 15 g, Rfl: 0 .  losartan (COZAAR) 100 MG tablet, Take 1 tablet (100 mg total) by mouth daily., Disp: 90 tablet, Rfl: 3 .  methimazole (TAPAZOLE) 10 MG tablet, Take 1 tablet (10 mg total) by mouth daily., Disp: 30 tablet, Rfl: 1 .  metoprolol succinate (TOPROL-XL) 50 MG 24 hr tablet, Take 1 tablet (50 mg total) by mouth daily., Disp: 90 tablet, Rfl: 1 .  sitaGLIPtin (JANUVIA) 100 MG tablet, Take 1 tablet (100 mg total) by mouth daily., Disp: 90 tablet,  Rfl: 3 .  verapamil (CALAN-SR) 240 MG CR tablet, Take 1 tablet (240 mg total) by mouth at bedtime., Disp: 90 tablet, Rfl: 3 .  XARELTO 20 MG TABS tablet, TAKE 1 TABLET(20 MG) BY MOUTH DAILY WITH SUPPER, Disp: 90 tablet, Rfl: 0   Allergies  Allergen Reactions  . Metformin Diarrhea and Nausea Only  . Viagra [Sildenafil Citrate] Other (See Comments)    headache    Past Medical History:  Diagnosis Date  . ALLERGIC RHINITIS 04/12/2009  . Allergy   . Anxiety   . Arthritis   . Atrial fibrillation ([place])   . BACK PAIN, LUMBAR 01/14/2011  . DEEP VENOUS THROMBOPHLEBITIS, LEG, LEFT 05/05/2009  . DM 08/08/2008  . DYSLIPIDEMIA 04/26/2009  . ECZEMA 05/23/2010  . 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 GERD (gastroesophageal reflux disease)   . GOUT 10/24/2010  . Hyperglycemia   . HYPERTENSION 07/21/2007  . 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 Leukopenia   . Long term (current) use of anticoagulants 04/03/2011  . PULMONARY EMBOLISM 05/03/2009  . Sleep apnea    CPAP at bedtime  . Thyroid disease    pt thinks hyperthyroidism  . UNSPECIFIED URINARY CALCULUS      Past Surgical History:  Procedure Laterality Date  . ANTERIOR CRUCIATE LIGAMENT REPAIR  2000   Right  . CYSTECTOMY     back of head  . KNEE ARTHROSCOPY  left knee  . KNEE ARTHROSCOPY  01/03/2013   Procedure: ARTHROSCOPY KNEE;  Surgeon: Yvette Rack., MD;  Location: [place];  Service: Orthopedics;  Laterality: Left;  Lavage Synovectomy, Removal of loose body  . KNEE ARTHROSCOPY W/ ACL RECONSTRUCTION     right knee    Family History  Problem Relation Age of Onset  . Diabetes Mother   . Hypertension Mother   . Hyperlipidemia Mother   . Dementia Mother   . Diabetes Father   . Hypertension Father   . Hyperlipidemia Father   . Heart attack Father   . Heart disease Father   . Prostate cancer Father 72  . Stomach cancer Neg Hx   . Colon cancer Neg Hx   . Pancreatic cancer Neg Hx   . Esophageal cancer Neg Hx   . Rectal cancer Neg Hx      Social History   Tobacco Use  . Smoking status: Light Tobacco Smoker    Types: Cigars    Last attempt to quit: 07/29/2018    Years since quitting: 2.6  . Smokeless tobacco: Never Used  Vaping Use  . Vaping Use: Never used  Substance Use Topics  . Alcohol use: Yes    Comment: Beer every other day  . Drug use: No    ROS   Objective:   Vitals: BP (!) 152/81 (BP Location: Right Arm)   Pulse 80   Temp 98.3 F (36.8 C) (Oral)   Resp 20   SpO2 99%   Physical Exam Constitutional:      General: He is not in acute distress.    Appearance: Normal appearance. He is well-developed. He is obese. He is not ill-appearing, toxic-appearing or diaphoretic.  HENT:     Head: Normocephalic and atraumatic.     Right Ear: External ear normal.     Left Ear: External ear normal.     Nose: Nose normal.     Mouth/Throat:     Pharynx: Oropharynx is clear.  Eyes:     General: No scleral icterus.       Right eye: No discharge.        Left eye: No discharge.     Extraocular Movements: Extraocular movements intact.     Pupils: Pupils are equal, round, and reactive to light.  Cardiovascular:     Rate and Rhythm: Normal rate.  Pulmonary:     Effort: Pulmonary effort is normal.  Musculoskeletal:     Cervical back: Normal range of motion.     Comments: Full range of motion throughout.  Strength 5/5 for lower extremities.  Patient ambulates without any assistance at expected pace.  No ecchymosis, swelling, lacerations or abrasions.  Patient does have paraspinal muscle tenderness along the left paraspinal muscles of the lumbar region excluding the midline.  Skin:    General: Skin is warm and dry.  Neurological:     Mental Status: He is alert and oriented to person, place, and time.     Motor: No weakness.     Coordination: Coordination normal.     Gait: Gait normal.     Deep Tendon Reflexes: Reflexes normal.  Psychiatric:        Mood and Affect: Mood normal.        Behavior: Behavior  normal.        Thought Content: Thought content normal.        Judgment: Judgment normal.     Assessment and Plan :   PDMP not  reviewed this encounter.  1. Acute left-sided low back pain without sciatica   2. Strain of lumbar region, initial encounter     Will manage conservatively for back strain with Tylenol and muscle relaxant, rest and modification of physical activity.  Anticipatory guidance provided.  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  Counseled patient on potential for adverse effects with medications prescribed/recommended today, ER and return-to-clinic precautions discussed, patient verbalized understanding.    Jaynee Eagles, [place] 03/17/21 1616

## 2021-03-20 ENCOUNTER — Other Ambulatory Visit: Payer: Self-pay | Admitting: Family Medicine

## 2021-03-20 DIAGNOSIS — E0590 Thyrotoxicosis, unspecified without thyrotoxic crisis or storm: Secondary | ICD-10-CM

## 2021-03-20 NOTE — Telephone Encounter (Signed)
Appears to be Non-compliant with this Rx.  Last refill: 11/01/20 #30 with 1 refill  Last OV: 12/18/20

## 2021-04-04 ENCOUNTER — Encounter (HOSPITAL_COMMUNITY): Payer: Self-pay | Admitting: Emergency Medicine

## 2021-04-04 ENCOUNTER — Ambulatory Visit (HOSPITAL_COMMUNITY)
Admission: EM | Admit: 2021-04-04 | Discharge: 2021-04-04 | Disposition: A | Discharge status: Home / Self Care | Payer: BC Managed Care – PPO | Attending: Emergency Medicine | Admitting: Emergency Medicine

## 2021-04-04 ENCOUNTER — Other Ambulatory Visit: Payer: Self-pay

## 2021-04-04 DIAGNOSIS — M545 Low back pain, unspecified: Secondary | ICD-10-CM

## 2021-04-04 DIAGNOSIS — S39012D Strain of muscle, fascia and tendon of lower back, subsequent encounter: Secondary | ICD-10-CM

## 2021-04-04 LAB — POCT URINALYSIS DIPSTICK, ED / UC
Bilirubin Urine: NEGATIVE
Glucose, UA: 500 mg/dL — AB
Hgb urine dipstick: NEGATIVE
Ketones, ur: NEGATIVE mg/dL
Leukocytes,Ua: NEGATIVE
Nitrite: NEGATIVE
Protein, ur: NEGATIVE mg/dL
Specific Gravity, Urine: 1.01 (ref 1.005–1.030)
Urobilinogen, UA: 0.2 mg/dL (ref 0.0–1.0)
pH: 6 (ref 5.0–8.0)

## 2021-04-04 LAB — CBG MONITORING, ED: Glucose-Capillary: 104 mg/dL — ABNORMAL HIGH (ref 70–99)

## 2021-04-04 NOTE — Discharge Instructions (Addendum)
Continue to rest and use ice for comfort of lower back pain.  You may continue to use the muscle relaxer that was previously prescribed for pain relief. You can take Tylenol as needed for pain as well. Drink plenty of water.   Follow-up with your primary care provider for long-term management of low back pain.

## 2021-04-04 NOTE — ED Provider Notes (Signed)
[place]    CSN: [id number] Arrival date & time: 04/04/21  1439      History   Chief Complaint Chief Complaint  Patient presents with  . Back Pain    Left    HPI Spencer English is a 55 y.o. male.   Patient is here for evaluation of left lower back pain that has been going on for the past 3 weeks.  Patient was seen at the end of March and given muscle relaxers but reports minimal relief from pain.  Reports significant pain on right lower back especially with movement.  Patient does report concern for DKA as he is diabetic or other kidney infection.  Denies any fevers, chest pain, shortness of breath, N/V/D, numbness, tingling, weakness, abdominal pain, or headaches.   ROS: As per HPI, all other pertinent ROS negative    The history is provided by the patient.    Past Medical History:  Diagnosis Date  . ALLERGIC RHINITIS 04/12/2009  . Allergy   . Anxiety   . Arthritis   . Atrial fibrillation ([place])   . BACK PAIN, LUMBAR 01/14/2011  . DEEP VENOUS THROMBOPHLEBITIS, LEG, LEFT 05/05/2009  . DM 08/08/2008  . DYSLIPIDEMIA 04/26/2009  . ECZEMA 05/23/2010  . 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 GERD (gastroesophageal reflux disease)   . GOUT 10/24/2010  . Hyperglycemia   . HYPERTENSION 07/21/2007  . 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 Leukopenia   . Long term (current) use of anticoagulants 04/03/2011  . PULMONARY EMBOLISM 05/03/2009  . Sleep apnea    CPAP at bedtime  . Thyroid disease    pt thinks hyperthyroidism  . UNSPECIFIED URINARY CALCULUS     Patient Active Problem List   Diagnosis Date Noted  . Umbilical hernia without obstruction and without gangrene 12/18/2020  . Hyperlipidemia associated with type 2 diabetes mellitus ([place]) 11/01/2020  . History of pulmonary embolus (PE) 12/17/2018  . OSA on CPAP 06/04/2017  . Obese 06/04/2017  . Moderate mitral regurgitation 06/26/2014  . Pulmonary edema 02/01/2013  . Hyperthyroidism 01/27/2013  . Moderate to severe  pulmonary hypertension ([place]) 01/26/2013  . Microcytic anemia 01/25/2013  . Thrombocytopenia ([place]) 01/25/2013  . History of septic arthritis 01/23/2013  . Atrial fibrillation ([place])   . Encounter for long-term (current) use of other medications 09/06/2012  . Pre-employment examination 10/11/2011  . Screening examination for infectious disease 06/16/2011  . Encounter for long-term (current) use of anticoagulants 04/03/2011  . Gout 10/24/2010  . UNSPECIFIED URINARY CALCULUS 07/04/2010  . ECZEMA 05/23/2010  . 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 Chronic pulmonary embolism ([place]) 05/03/2009  . DYSLIPIDEMIA 04/26/2009  . Allergic rhinitis 04/12/2009  . Pain in joint, ankle and foot 04/12/2009  . Type 2 diabetes mellitus with other specified complication ([place]) 99/24/2683  . 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 SEBACEOUS CYST 01/31/2008  . Essential hypertension 07/21/2007    Past Surgical History:  Procedure Laterality Date  . ANTERIOR CRUCIATE LIGAMENT REPAIR  2000   Right  . CYSTECTOMY     back of head  . KNEE ARTHROSCOPY     left knee  . KNEE ARTHROSCOPY  01/03/2013   Procedure: ARTHROSCOPY KNEE;  Surgeon: Yvette Rack., MD;  Location: [facility];  Service: Orthopedics;  Laterality: Left;  Lavage Synovectomy, Removal of loose body  . KNEE ARTHROSCOPY W/ ACL RECONSTRUCTION     right knee       Home Medications    Prior to Admission medications   Medication Sig Start  Date End Date Taking? Authorizing Provider  allopurinol (ZYLOPRIM) 300 MG tablet Take 300 mg by mouth as needed (gout flare up).    [provider]  atorvastatin (LIPITOR) 40 MG tablet Take 1 tablet (40 mg total) by mouth daily. 12/20/20   Lesleigh Noe, MD  dapagliflozin propanediol (FARXIGA) 5 MG TABS tablet Take 1 tablet (5 mg total) by mouth daily. 11/01/20   Lesleigh Noe, MD  fluticasone (FLONASE) 50 MCG/ACT nasal spray Place 1 spray into both nostrils daily. allergies    [provider]   furosemide (LASIX) 40 MG tablet TAKE 1 TABLET(40 MG) BY MOUTH DAILY 01/30/21   Lesleigh Noe, MD  glucose blood (CONTOUR NEXT TEST) test strip 1 each by Other route daily. And lancets 1/day 09/02/19   Renato Shin, MD  halobetasol (ULTRAVATE) 0.05 % cream APPLY CREAM TOPICALLY THREE TIMES DAILY AS NEEDED FOR RASH 12/02/18   Renato Shin, MD  losartan (COZAAR) 100 MG tablet Take 1 tablet (100 mg total) by mouth daily. 11/01/20   Lesleigh Noe, MD  methimazole (TAPAZOLE) 10 MG tablet TAKE 1 TABLET(10 MG) BY MOUTH DAILY 03/20/21   Lesleigh Noe, MD  metoprolol succinate (TOPROL-XL) 50 MG 24 hr tablet Take 1 tablet (50 mg total) by mouth daily. 12/18/20   Lesleigh Noe, MD  sitaGLIPtin (JANUVIA) 100 MG tablet Take 1 tablet (100 mg total) by mouth daily. 12/18/20   Lesleigh Noe, MD  tiZANidine (ZANAFLEX) 4 MG tablet Take 1 tablet (4 mg total) by mouth every 8 (eight) hours as needed. 03/17/21   Jaynee Eagles, PA-C  verapamil (CALAN-SR) 240 MG CR tablet Take 1 tablet (240 mg total) by mouth at bedtime. 11/01/20   Lesleigh Noe, MD  XARELTO 20 MG TABS tablet TAKE 1 TABLET(20 MG) BY MOUTH DAILY WITH SUPPER 01/15/21   Lesleigh Noe, MD    Family History Family History  Problem Relation Age of Onset  . Diabetes Mother   . Hypertension Mother   . Hyperlipidemia Mother   . Dementia Mother   . Diabetes Father   . Hypertension Father   . Hyperlipidemia Father   . Heart attack Father   . Heart disease Father   . Prostate cancer Father 58  . Stomach cancer Neg Hx   . Colon cancer Neg Hx   . Pancreatic cancer Neg Hx   . Esophageal cancer Neg Hx   . Rectal cancer Neg Hx     Social History Social History   Tobacco Use  . Smoking status: Light Tobacco Smoker    Types: Cigars    Last attempt to quit: 07/29/2018    Years since quitting: 2.6  . Smokeless tobacco: Never Used  Vaping Use  . Vaping Use: Never used  Substance Use Topics  . Alcohol use: Yes    Comment: Beer every other day  .  Drug use: No     Allergies   Metformin and Viagra [sildenafil citrate]   Review of Systems Review of Systems  Musculoskeletal: Positive for back pain.  All other systems reviewed and are negative.    Physical Exam Triage Vital Signs ED Triage Vitals  Enc Vitals Group     BP 04/04/21 1505 (!) 164/96     Pulse Rate 04/04/21 1505 78     Resp 04/04/21 1505 16     Temp 04/04/21 1505 98.9 F (37.2 C)     Temp Source 04/04/21 1505 Oral     SpO2 04/04/21  1505 99 %     Weight --      Height --      Head Circumference --      Peak Flow --      Pain Score 04/04/21 1503 5     Pain Loc --      Pain Edu? --      Excl. in [place]? --    No data found.  Updated Vital Signs BP (!) 164/96 (BP Location: Right Arm)   Pulse 78   Temp 98.9 F (37.2 C) (Oral)   Resp 16   SpO2 99%   Visual Acuity Right Eye Distance:   Left Eye Distance:   Bilateral Distance:    Right Eye Near:   Left Eye Near:    Bilateral Near:     Physical Exam Vitals and nursing note reviewed.  Constitutional:      General: He is not in acute distress.    Appearance: Normal appearance. He is not ill-appearing, toxic-appearing or diaphoretic.  HENT:     Head: Normocephalic and atraumatic.  Eyes:     Conjunctiva/sclera: Conjunctivae normal.  Cardiovascular:     Rate and Rhythm: Normal rate.     Pulses: Normal pulses.  Pulmonary:     Effort: Pulmonary effort is normal.  Abdominal:     General: Abdomen is flat.     Tenderness: There is no right CVA tenderness or left CVA tenderness.  Musculoskeletal:     Cervical back: Normal range of motion.     Lumbar back: Spasms and tenderness present. No swelling, edema, deformity, signs of trauma or bony tenderness. Decreased range of motion (patient with full ROM but reports that movement can make pain worse). Negative right straight leg raise test and negative left straight leg raise test.  Skin:    General: Skin is warm and dry.  Neurological:     General: No  focal deficit present.     Mental Status: He is alert and oriented to person, place, and time.  Psychiatric:        Mood and Affect: Mood normal.      UC Treatments / Results  Labs (all labs ordered are listed, but only abnormal results are displayed) Labs Reviewed  POCT URINALYSIS DIPSTICK, ED / UC - Abnormal; Notable for the following components:      Result Value   Glucose, UA 500 (*)    All other components within normal limits  CBG MONITORING, ED - Abnormal; Notable for the following components:   Glucose-Capillary 104 (*)    All other components within normal limits    EKG   Radiology No results found.  Procedures Procedures (including critical care time)  Medications Ordered in UC Medications - No data to display  Initial Impression / Assessment and Plan / UC Course  I have reviewed the triage vital signs and the nursing notes.  Pertinent labs & imaging results that were available during my care of the patient were reviewed by me and considered in my medical decision making (see chart for details).     Left lower back pain and lumbar strain.  Assessment negative for red flags or concerns.  Urinalysis with glucose but otherwise negative and CBG 104 in office.  It is unlikely that patient is in DKA or has a kidney infection.  Recommend continued conservative management using muscle relaxers, ice, and OTC pain medications.  Follow-up with primary care for evaluation and long-term management of lower back pain. May follow-up with  emergency department if pain continues as he may require further evaluation including CT scans.     Final Clinical Impressions(s) / UC Diagnoses   Final diagnoses:  Left low back pain, unspecified chronicity, unspecified whether sciatica present  Strain of lumbar region, subsequent encounter     Discharge Instructions     Continue to rest and use ice for comfort of lower back pain.  You may continue to use the muscle relaxer that was  previously prescribed for pain relief. You can take Tylenol as needed for pain as well. Drink plenty of water.   Follow-up with your primary care provider for long-term management of low back pain.   ED Prescriptions    None     PDMP not reviewed this encounter.   Pearson Forster, NP 04/04/21 1546

## 2021-04-04 NOTE — ED Triage Notes (Signed)
Pt presents with left side lower back pain xs 3 weeks. States was seen on 3/20 and given muscle relaxer but not much relief.

## 2021-04-07 ENCOUNTER — Other Ambulatory Visit: Payer: Self-pay

## 2021-04-07 ENCOUNTER — Emergency Department (HOSPITAL_BASED_OUTPATIENT_CLINIC_OR_DEPARTMENT_OTHER)
Admission: EM | Admit: 2021-04-07 | Discharge: 2021-04-07 | Disposition: A | Discharge status: Home / Self Care | Payer: BC Managed Care – PPO | Attending: Emergency Medicine | Admitting: Emergency Medicine

## 2021-04-07 ENCOUNTER — Emergency Department (HOSPITAL_BASED_OUTPATIENT_CLINIC_OR_DEPARTMENT_OTHER): Payer: BC Managed Care – PPO

## 2021-04-07 ENCOUNTER — Encounter (HOSPITAL_BASED_OUTPATIENT_CLINIC_OR_DEPARTMENT_OTHER): Payer: Self-pay | Admitting: Emergency Medicine

## 2021-04-07 DIAGNOSIS — N200 Calculus of kidney: Secondary | ICD-10-CM | POA: Diagnosis not present

## 2021-04-07 DIAGNOSIS — Z79899 Other long term (current) drug therapy: Secondary | ICD-10-CM | POA: Insufficient documentation

## 2021-04-07 DIAGNOSIS — Z7901 Long term (current) use of anticoagulants: Secondary | ICD-10-CM | POA: Insufficient documentation

## 2021-04-07 DIAGNOSIS — I724 Aneurysm of artery of lower extremity: Secondary | ICD-10-CM | POA: Diagnosis not present

## 2021-04-07 DIAGNOSIS — R109 Unspecified abdominal pain: Secondary | ICD-10-CM | POA: Insufficient documentation

## 2021-04-07 DIAGNOSIS — I10 Essential (primary) hypertension: Secondary | ICD-10-CM | POA: Insufficient documentation

## 2021-04-07 DIAGNOSIS — I723 Aneurysm of iliac artery: Secondary | ICD-10-CM | POA: Diagnosis not present

## 2021-04-07 DIAGNOSIS — F17210 Nicotine dependence, cigarettes, uncomplicated: Secondary | ICD-10-CM | POA: Diagnosis not present

## 2021-04-07 DIAGNOSIS — E119 Type 2 diabetes mellitus without complications: Secondary | ICD-10-CM | POA: Diagnosis not present

## 2021-04-07 DIAGNOSIS — K429 Umbilical hernia without obstruction or gangrene: Secondary | ICD-10-CM | POA: Diagnosis not present

## 2021-04-07 LAB — CBC
HCT: 47.1 % (ref 39.0–52.0)
Hemoglobin: 15 g/dL (ref 13.0–17.0)
MCH: 27.1 pg (ref 26.0–34.0)
MCHC: 31.8 g/dL (ref 30.0–36.0)
MCV: 85 fL (ref 80.0–100.0)
Platelets: 184 10*3/uL (ref 150–400)
RBC: 5.54 MIL/uL (ref 4.22–5.81)
RDW: 16 % — ABNORMAL HIGH (ref 11.5–15.5)
WBC: 4.4 10*3/uL (ref 4.0–10.5)
nRBC: 0 % (ref 0.0–0.2)

## 2021-04-07 LAB — BASIC METABOLIC PANEL
Anion gap: 8 (ref 5–15)
BUN: 11 mg/dL (ref 6–20)
CO2: 27 mmol/L (ref 22–32)
Calcium: 8.8 mg/dL — ABNORMAL LOW (ref 8.9–10.3)
Chloride: 103 mmol/L (ref 98–111)
Creatinine, Ser: 1.36 mg/dL — ABNORMAL HIGH (ref 0.61–1.24)
GFR, Estimated: >60 mL/min (ref >60)
Glucose, Bld: 108 mg/dL — ABNORMAL HIGH (ref 70–99)
Potassium: 4.1 mmol/L (ref 3.5–5.1)
Sodium: 138 mmol/L (ref 135–145)

## 2021-04-07 LAB — URINALYSIS, MICROSCOPIC (REFLEX): RBC / HPF: NONE SEEN RBC/hpf (ref 0–5)

## 2021-04-07 LAB — URINALYSIS, ROUTINE W REFLEX MICROSCOPIC
Bilirubin Urine: NEGATIVE
Glucose, UA: >=500 mg/dL — AB
Hgb urine dipstick: NEGATIVE
Ketones, ur: NEGATIVE mg/dL
Leukocytes,Ua: NEGATIVE
Nitrite: NEGATIVE
Protein, ur: NEGATIVE mg/dL
Specific Gravity, Urine: 1.015 (ref 1.005–1.030)
pH: 7 (ref 5.0–8.0)

## 2021-04-07 MED ORDER — PREDNISONE 20 MG PO TABS: 40.0000 mg | ORAL_TABLET | Freq: Every day | ORAL | 0 refills | Status: DC

## 2021-04-07 NOTE — ED Triage Notes (Signed)
Pt here for back pain for 3 weeks, increasing in intensity. Now having abdominal pain, stabbing pain, intermittent.

## 2021-04-07 NOTE — ED Provider Notes (Signed)
[facility] EMERGENCY DEPARTMENT Provider Note   CSN: [id number] Arrival date & time: 04/07/21  4098     History Chief Complaint  Patient presents with  . Back Pain    Spencer English is a 55 y.o. male.  HPI Patient presents with left-sided back/flank pain.  Has had for around 3 weeks.  Went to urgent care and had muscle aches was given.  Continued pain.  States it did not help.  No dysuria but states his urine is darker and may be more bubbly.  Pain is worse with movements.  Did not have known injury.  States he does sometimes lift heavy bags at work but did not have a specific injury.  Pain is on the left side of his abdomen tube.  No rash.  Pain does improve with staying still.    Past Medical History:  Diagnosis Date  . ALLERGIC RHINITIS 04/12/2009  . Allergy   . Anxiety   . Arthritis   . Atrial fibrillation ([place])   . BACK PAIN, LUMBAR 01/14/2011  . DEEP VENOUS THROMBOPHLEBITIS, LEG, LEFT 05/05/2009  . DM 08/08/2008  . DYSLIPIDEMIA 04/26/2009  . ECZEMA 05/23/2010  . 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 GERD (gastroesophageal reflux disease)   . GOUT 10/24/2010  . Hyperglycemia   . HYPERTENSION 07/21/2007  . 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 Leukopenia   . Long term (current) use of anticoagulants 04/03/2011  . PULMONARY EMBOLISM 05/03/2009  . Sleep apnea    CPAP at bedtime  . UNSPECIFIED URINARY CALCULUS     Patient Active Problem List   Diagnosis Date Noted  . Umbilical hernia without obstruction and without gangrene 12/18/2020  . Hyperlipidemia associated with type 2 diabetes mellitus ([place]) 11/01/2020  . History of pulmonary embolus (PE) 12/17/2018  . OSA on CPAP 06/04/2017  . Obese 06/04/2017  . Moderate mitral regurgitation 06/26/2014  . Pulmonary edema 02/01/2013  . Hyperthyroidism 01/27/2013  . Moderate to severe pulmonary hypertension ([place]) 01/26/2013  . Microcytic anemia 01/25/2013  . Thrombocytopenia ([place]) 01/25/2013  . History of septic arthritis  01/23/2013  . Atrial fibrillation ([place])   . Encounter for long-term (current) use of other medications 09/06/2012  . Pre-employment examination 10/11/2011  . Screening examination for infectious disease 06/16/2011  . Encounter for long-term (current) use of anticoagulants 04/03/2011  . Gout 10/24/2010  . UNSPECIFIED URINARY CALCULUS 07/04/2010  . ECZEMA 05/23/2010  . 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 Chronic pulmonary embolism ([place]) 05/03/2009  . DYSLIPIDEMIA 04/26/2009  . Allergic rhinitis 04/12/2009  . Pain in joint, ankle and foot 04/12/2009  . Type 2 diabetes mellitus with other specified complication ([place]) 11/91/4782  . 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 SEBACEOUS CYST 01/31/2008  . Essential hypertension 07/21/2007    Past Surgical History:  Procedure Laterality Date  . ANTERIOR CRUCIATE LIGAMENT REPAIR  2000   Right  . CYSTECTOMY     back of head  . KNEE ARTHROSCOPY     left knee  . KNEE ARTHROSCOPY  01/03/2013   Procedure: ARTHROSCOPY KNEE;  Surgeon: Yvette Rack., MD;  Location: [facility];  Service: Orthopedics;  Laterality: Left;  Lavage Synovectomy, Removal of loose body  . KNEE ARTHROSCOPY W/ ACL RECONSTRUCTION     right knee       Family History  Problem Relation Age of Onset  . Diabetes Mother   . Hypertension Mother   . Hyperlipidemia Mother   . Dementia Mother   . Diabetes Father   . Hypertension Father   .  Hyperlipidemia Father   . Heart attack Father   . Heart disease Father   . Prostate cancer Father 10  . Stomach cancer Neg Hx   . Colon cancer Neg Hx   . Pancreatic cancer Neg Hx   . Esophageal cancer Neg Hx   . Rectal cancer Neg Hx     Social History   Tobacco Use  . Smoking status: Light Tobacco Smoker    Types: Cigars    Last attempt to quit: 07/29/2018    Years since quitting: 2.6  . Smokeless tobacco: Never Used  Vaping Use  . Vaping Use: Never used  Substance Use Topics  . Alcohol use: Yes    Comment: once week  . Drug use: No     Home Medications Prior to Admission medications   Medication Sig Start Date End Date Taking? Authorizing Provider  atorvastatin (LIPITOR) 40 MG tablet Take 1 tablet (40 mg total) by mouth daily. 12/20/20  Yes Lesleigh Noe, MD  dapagliflozin propanediol (FARXIGA) 5 MG TABS tablet Take 1 tablet (5 mg total) by mouth daily. 11/01/20  Yes Lesleigh Noe, MD  fluticasone (FLONASE) 50 MCG/ACT nasal spray Place 1 spray into both nostrils daily. allergies   Yes [provider]  furosemide (LASIX) 40 MG tablet TAKE 1 TABLET(40 MG) BY MOUTH DAILY 01/30/21  Yes Lesleigh Noe, MD  glucose blood (CONTOUR NEXT TEST) test strip 1 each by Other route daily. And lancets 1/day 09/02/19  Yes Renato Shin, MD  losartan (COZAAR) 100 MG tablet Take 1 tablet (100 mg total) by mouth daily. 11/01/20  Yes Lesleigh Noe, MD  methimazole (TAPAZOLE) 10 MG tablet TAKE 1 TABLET(10 MG) BY MOUTH DAILY 03/20/21  Yes Lesleigh Noe, MD  metoprolol succinate (TOPROL-XL) 50 MG 24 hr tablet Take 1 tablet (50 mg total) by mouth daily. 12/18/20  Yes Lesleigh Noe, MD  predniSONE (DELTASONE) 20 MG tablet Take 2 tablets (40 mg total) by mouth daily. 04/07/21  Yes Davonna Belling, MD  sitaGLIPtin (JANUVIA) 100 MG tablet Take 1 tablet (100 mg total) by mouth daily. 12/18/20  Yes Lesleigh Noe, MD  tiZANidine (ZANAFLEX) 4 MG tablet Take 1 tablet (4 mg total) by mouth every 8 (eight) hours as needed. 03/17/21  Yes Jaynee Eagles, PA-C  verapamil (CALAN-SR) 240 MG CR tablet Take 1 tablet (240 mg total) by mouth at bedtime. 11/01/20  Yes Lesleigh Noe, MD  XARELTO 20 MG TABS tablet TAKE 1 TABLET(20 MG) BY MOUTH DAILY WITH SUPPER 01/15/21  Yes Lesleigh Noe, MD  allopurinol (ZYLOPRIM) 300 MG tablet Take 300 mg by mouth as needed (gout flare up).    [provider]  halobetasol (ULTRAVATE) 0.05 % cream APPLY CREAM TOPICALLY THREE TIMES DAILY AS NEEDED FOR RASH 12/02/18   Renato Shin, MD    Allergies    Metformin  and Viagra [sildenafil citrate]  Review of Systems   Review of Systems  Constitutional: Negative for appetite change.  HENT: Negative for congestion.   Respiratory: Negative for shortness of breath.   Cardiovascular: Negative for chest pain.  Gastrointestinal: Positive for abdominal pain.  Endocrine: Negative for polyuria.  Genitourinary: Positive for flank pain.  Musculoskeletal: Positive for back pain.  Skin: Negative for rash.  Neurological: Negative for weakness.  Psychiatric/Behavioral: Negative for confusion.    Physical Exam Updated Vital Signs BP (!) 169/102   Pulse 69   Temp 98.8 F (37.1 C)   Resp 18   Ht 6' (  1.829 m)   Wt 122 kg   SpO2 100%   BMI 36.48 kg/m   Physical Exam Vitals and nursing note reviewed.  HENT:     Head: Normocephalic.     Mouth/Throat:     Mouth: Mucous membranes are moist.  Eyes:     Pupils: Pupils are equal, round, and reactive to light.  Cardiovascular:     Rate and Rhythm: Normal rate.  Pulmonary:     Breath sounds: Normal breath sounds.  Abdominal:     Comments: Some left-sided abdominal tenderness.  Genitourinary:    Comments: Some CVA tenderness on left. Musculoskeletal:     Cervical back: Neck supple.     Comments: No extremity tenderness or swelling.  Skin:    Capillary Refill: Capillary refill takes less than 2 seconds.     Findings: No rash.  Neurological:     Mental Status: He is alert and oriented to person, place, and time.  Psychiatric:        Mood and Affect: Mood normal.     ED Results / Procedures / Treatments   Labs (all labs ordered are listed, but only abnormal results are displayed) Labs Reviewed  URINALYSIS, ROUTINE W REFLEX MICROSCOPIC - Abnormal; Notable for the following components:      Result Value   Glucose, UA >=500 (*)    All other components within normal limits  CBC - Abnormal; Notable for the following components:   RDW 16.0 (*)    All other components within normal limits  BASIC  METABOLIC PANEL - Abnormal; Notable for the following components:   Glucose, Bld 108 (*)    Creatinine, Ser 1.36 (*)    Calcium 8.8 (*)    All other components within normal limits  URINALYSIS, MICROSCOPIC (REFLEX) - Abnormal; Notable for the following components:   Bacteria, UA RARE (*)    All other components within normal limits    EKG None  Radiology CT ABDOMEN PELVIS WO CONTRAST  Result Date: 04/07/2021 CLINICAL DATA:  Three week history of LEFT flank pain radiating into the lower pelvis. EXAM: CT ABDOMEN AND PELVIS WITHOUT CONTRAST TECHNIQUE: Multidetector CT imaging of the abdomen and pelvis was performed following the standard protocol without IV contrast. COMPARISON:  09/18/2018 and earlier. FINDINGS: Lower chest: Heart size upper normal with mild left circumflex and right coronary artery atherosclerosis. Visualized lung bases clear. Hepatobiliary: Normal unenhanced appearance of the liver. Gallbladder normal in appearance without calcified gallstones. No biliary ductal dilation. Pancreas: Solitary calcification in the head of the pancreas which appears to be parenchymal and not ductal. Pancreas otherwise normal in appearance without mass or inflammation. Spleen: Normal unenhanced appearance. Adrenals/Urinary Tract: Normal appearing adrenal glands. 9 mm calculus in an upper pole infundibulum of the LEFT kidney, unchanged in size or position since the September, 2019 CT. No urinary tract calculi elsewhere on either side. No hydronephrosis involving either kidney. Within the limits of the unenhanced technique, no focal parenchymal abnormality involving either kidney. Stomach/Bowel: Stomach normal in appearance for the degree of distention. Numerous small bowel loops located lateral to the cecum and ascending colon as noted previously, raising the question of an internal hernia. The small bowel is normal in appearance. Expected stool burden throughout the normal appearing colon. Mobile cecum  positioned in the RIGHT mid abdomen approaching the midline. Normal appearing appendix in the RIGHT mid abdomen extending into the upper pelvis. Vascular/Lymphatic: Mild atherosclerosis involving the distal abdominal aorta and moderate atherosclerosis involving the iliofemoral arteries bilaterally.  RIGHT common iliac artery aneurysm measuring approximately 1.9 cm and LEFT common iliac artery aneurysm measuring approximately 1.9 cm, unchanged since the September, 2019 CT. 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 No pathologic lymphadenopathy. Reproductive: Upper normal sized prostate gland containing calcifications. Normal seminal vesicles. Other: Umbilical hernia containing fat, with the defect measuring approximately 3.2 cm, unchanged. Musculoskeletal: Degenerative changes and DISH involving the lower thoracic spine. Multilevel degenerative disc disease throughout the lumbar spine. Facet degenerative changes on the RIGHT at L4-5 and L5-S1. Degenerative changes involving the sacroiliac joints. No acute findings. IMPRESSION: 1. No acute abnormalities involving the abdomen or pelvis. 2. 9 mm calculus in an upper pole infundibulum of the LEFT kidney, unchanged in size and location since the September, 2019 CT. No urinary tract calculi elsewhere on either side. 3. Numerous small bowel loops located lateral to the cecum and ascending colon as noted previously, raising the question of an internal hernia. No evidence of bowel obstruction. 4. Stable umbilical hernia containing fat. 5. 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 6. Aortic Atherosclerosis (ICD10-I70.0). Electronically Signed   By: Evangeline Dakin M.D.   On: 04/07/2021 12:53    Procedures Procedures   Medications Ordered in ED Medications - No data to display  ED  Course  I have reviewed the triage vital signs and the nursing notes.  Pertinent labs & imaging results that were available during my care of the patient were reviewed by me and considered in my medical decision making (see chart for details).    MDM Rules/Calculators/A&P                         Patient with left flank pain.  Worse with movements.  No rash.  No direct trauma.  Urine does not show infection.  CT scan done and showed stable findings.  Lungs are clear.  Potentially musculoskeletal.  No retroperitoneal blood.  Discharge home.  Will try course of steroids.  However patient is diabetic and will keep a closer eye on his sugars.  Outpatient follow-up with PCP Final Clinical Impression(s) / ED Diagnoses Final diagnoses:  Flank pain    Rx / DC Orders ED Discharge Orders         Ordered    predniSONE (DELTASONE) 20 MG tablet  Daily        04/07/21 1324           Davonna Belling, MD 04/07/21 1325

## 2021-04-07 NOTE — Discharge Instructions (Addendum)
KeepHopefully the steroids can help with the pain.  Also I added sugars.  Follow-up with your doctor as needed.

## 2021-04-07 NOTE — ED Notes (Signed)
Attempted piv insertion left anterior forearm unsuccessfully. Attempted right wrist lab draw unsuccessful.

## 2021-04-11 ENCOUNTER — Inpatient Hospital Stay: Payer: BC Managed Care – PPO | Admitting: Family Medicine

## 2021-04-22 ENCOUNTER — Encounter (HOSPITAL_BASED_OUTPATIENT_CLINIC_OR_DEPARTMENT_OTHER): Payer: Self-pay | Admitting: Emergency Medicine

## 2021-04-22 ENCOUNTER — Emergency Department (HOSPITAL_BASED_OUTPATIENT_CLINIC_OR_DEPARTMENT_OTHER)
Admission: EM | Admit: 2021-04-22 | Discharge: 2021-04-22 | Disposition: A | Discharge status: Home / Self Care | Payer: BC Managed Care – PPO | Attending: Emergency Medicine | Admitting: Emergency Medicine

## 2021-04-22 ENCOUNTER — Other Ambulatory Visit: Payer: Self-pay

## 2021-04-22 DIAGNOSIS — I10 Essential (primary) hypertension: Secondary | ICD-10-CM

## 2021-04-22 DIAGNOSIS — F17290 Nicotine dependence, other tobacco product, uncomplicated: Secondary | ICD-10-CM | POA: Diagnosis not present

## 2021-04-22 DIAGNOSIS — Z7984 Long term (current) use of oral hypoglycemic drugs: Secondary | ICD-10-CM | POA: Diagnosis not present

## 2021-04-22 DIAGNOSIS — K047 Periapical abscess without sinus: Secondary | ICD-10-CM | POA: Diagnosis not present

## 2021-04-22 DIAGNOSIS — E119 Type 2 diabetes mellitus without complications: Secondary | ICD-10-CM | POA: Diagnosis not present

## 2021-04-22 DIAGNOSIS — L0201 Cutaneous abscess of face: Secondary | ICD-10-CM

## 2021-04-22 DIAGNOSIS — Z79899 Other long term (current) drug therapy: Secondary | ICD-10-CM | POA: Insufficient documentation

## 2021-04-22 DIAGNOSIS — R609 Edema, unspecified: Secondary | ICD-10-CM | POA: Diagnosis not present

## 2021-04-22 DIAGNOSIS — Z7901 Long term (current) use of anticoagulants: Secondary | ICD-10-CM | POA: Insufficient documentation

## 2021-04-22 MED ORDER — ACETAMINOPHEN 500 MG PO TABS
1000.0000 mg | ORAL_TABLET | Freq: Once | ORAL | Status: AC
  Administered 2021-04-22: 1000 mg via ORAL
  Filled 2021-04-22: qty 2

## 2021-04-22 MED ORDER — AMOXICILLIN-POT CLAVULANATE 875-125 MG PO TABS: 1.0000 | ORAL_TABLET | Freq: Two times a day (BID) | ORAL | 0 refills | Status: DC

## 2021-04-22 MED ORDER — AMOXICILLIN-POT CLAVULANATE 875-125 MG PO TABS
1.0000 | ORAL_TABLET | Freq: Once | ORAL | Status: AC
  Administered 2021-04-22: 1 via ORAL
  Filled 2021-04-22: qty 1

## 2021-04-22 MED ORDER — LIDOCAINE-EPINEPHRINE (PF) 2 %-1:200000 IJ SOLN
20.0000 mL | Freq: Once | INTRAMUSCULAR | Status: AC
  Administered 2021-04-22: 20 mL
  Filled 2021-04-22: qty 20

## 2021-04-22 NOTE — Discharge Instructions (Addendum)
It was our pleasure to provide your ER care today - we hope that you feel better.  Warm compresses to sore area. Take augmentin (antibiotic) as prescribed. Take acetaminophen as need.   For dental issues, follow up closely with your dentist/oral surgeon in the next 1-2 weeks - call office to arrange follow up.   Your blood pressure is high today - continue your meds, limit salt intake, and follow up with primary care doctor in 1 week.   Return to ER if worse, new symptoms, fevers, spreading redness, increased swelling, trouble breathing or swallowing, or other concern.

## 2021-04-22 NOTE — ED Provider Notes (Addendum)
[facility] EMERGENCY DEPT Provider Note   CSN: [id number] Arrival date & time: 04/22/21  1008     History Chief Complaint  Patient presents with  . Facial Swelling    Spencer English is a 55 y.o. male.  Patient c/o painful, swollen area to left lower jaw that began three days ago. Symptoms acute onset, moderate, dull, non radiating.  States had similar area in past that was smaller, and eventually went away on own. Denies prior I and D or abscesses. No trauma to area. Denies left lower dental pain. No neck pain or swelling. No trouble breathing or swallowing. No fever or chills. Otherwise does not feel sick or ill.   The history is provided by the patient.       Past Medical History:  Diagnosis Date  . ALLERGIC RHINITIS 04/12/2009  . Allergy   . Anxiety   . Arthritis   . Atrial fibrillation ([place])   . BACK PAIN, LUMBAR 01/14/2011  . DEEP VENOUS THROMBOPHLEBITIS, LEG, LEFT 05/05/2009  . DM 08/08/2008  . DYSLIPIDEMIA 04/26/2009  . ECZEMA 05/23/2010  . 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 GERD (gastroesophageal reflux disease)   . GOUT 10/24/2010  . Hyperglycemia   . HYPERTENSION 07/21/2007  . 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 Leukopenia   . Long term (current) use of anticoagulants 04/03/2011  . PULMONARY EMBOLISM 05/03/2009  . Sleep apnea    CPAP at bedtime  . UNSPECIFIED URINARY CALCULUS     Patient Active Problem List   Diagnosis Date Noted  . Umbilical hernia without obstruction and without gangrene 12/18/2020  . Hyperlipidemia associated with type 2 diabetes mellitus ([place]) 11/01/2020  . History of pulmonary embolus (PE) 12/17/2018  . OSA on CPAP 06/04/2017  . Obese 06/04/2017  . Moderate mitral regurgitation 06/26/2014  . Pulmonary edema 02/01/2013  . Hyperthyroidism 01/27/2013  . Moderate to severe pulmonary hypertension ([place]) 01/26/2013  . Microcytic anemia 01/25/2013  . Thrombocytopenia ([place]) 01/25/2013  . History of septic arthritis 01/23/2013  .  Atrial fibrillation ([place])   . Encounter for long-term (current) use of other medications 09/06/2012  . Pre-employment examination 10/11/2011  . Screening examination for infectious disease 06/16/2011  . Encounter for long-term (current) use of anticoagulants 04/03/2011  . Gout 10/24/2010  . UNSPECIFIED URINARY CALCULUS 07/04/2010  . ECZEMA 05/23/2010  . 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 Chronic pulmonary embolism ([place]) 05/03/2009  . DYSLIPIDEMIA 04/26/2009  . Allergic rhinitis 04/12/2009  . Pain in joint, ankle and foot 04/12/2009  . Type 2 diabetes mellitus with other specified complication ([place]) 52/77/8242  . 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 SEBACEOUS CYST 01/31/2008  . Essential hypertension 07/21/2007    Past Surgical History:  Procedure Laterality Date  . ANTERIOR CRUCIATE LIGAMENT REPAIR  2000   Right  . CYSTECTOMY     back of head  . KNEE ARTHROSCOPY     left knee  . KNEE ARTHROSCOPY  01/03/2013   Procedure: ARTHROSCOPY KNEE;  Surgeon: Yvette Rack., MD;  Location: [facility];  Service: Orthopedics;  Laterality: Left;  Lavage Synovectomy, Removal of loose body  . KNEE ARTHROSCOPY W/ ACL RECONSTRUCTION     right knee       Family History  Problem Relation Age of Onset  . Diabetes Mother   . Hypertension Mother   . Hyperlipidemia Mother   . Dementia Mother   . Diabetes Father   . Hypertension Father   . Hyperlipidemia Father   . Heart attack Father   .  Heart disease Father   . Prostate cancer Father 63  . Stomach cancer Neg Hx   . Colon cancer Neg Hx   . Pancreatic cancer Neg Hx   . Esophageal cancer Neg Hx   . Rectal cancer Neg Hx     Social History   Tobacco Use  . Smoking status: Light Tobacco Smoker    Types: Cigars    Last attempt to quit: 07/29/2018    Years since quitting: 2.7  . Smokeless tobacco: Never Used  . Tobacco comment: 2 per week  Vaping Use  . Vaping Use: Never used  Substance Use Topics  . Alcohol use: Yes    Comment: once week   . Drug use: No    Home Medications Prior to Admission medications   Medication Sig Start Date End Date Taking? Authorizing Provider  allopurinol (ZYLOPRIM) 300 MG tablet Take 300 mg by mouth as needed (gout flare up).    [provider]  atorvastatin (LIPITOR) 40 MG tablet Take 1 tablet (40 mg total) by mouth daily. 12/20/20   Lesleigh Noe, MD  dapagliflozin propanediol (FARXIGA) 5 MG TABS tablet Take 1 tablet (5 mg total) by mouth daily. 11/01/20   Lesleigh Noe, MD  fluticasone (FLONASE) 50 MCG/ACT nasal spray Place 1 spray into both nostrils daily. allergies    [provider]  furosemide (LASIX) 40 MG tablet TAKE 1 TABLET(40 MG) BY MOUTH DAILY 01/30/21   Lesleigh Noe, MD  glucose blood (CONTOUR NEXT TEST) test strip 1 each by Other route daily. And lancets 1/day 09/02/19   Renato Shin, MD  halobetasol (ULTRAVATE) 0.05 % cream APPLY CREAM TOPICALLY THREE TIMES DAILY AS NEEDED FOR RASH 12/02/18   Renato Shin, MD  losartan (COZAAR) 100 MG tablet Take 1 tablet (100 mg total) by mouth daily. 11/01/20   Lesleigh Noe, MD  methimazole (TAPAZOLE) 10 MG tablet TAKE 1 TABLET(10 MG) BY MOUTH DAILY 03/20/21   Lesleigh Noe, MD  metoprolol succinate (TOPROL-XL) 50 MG 24 hr tablet Take 1 tablet (50 mg total) by mouth daily. 12/18/20   Lesleigh Noe, MD  predniSONE (DELTASONE) 20 MG tablet Take 2 tablets (40 mg total) by mouth daily. 04/07/21   Davonna Belling, MD  sitaGLIPtin (JANUVIA) 100 MG tablet Take 1 tablet (100 mg total) by mouth daily. 12/18/20   Lesleigh Noe, MD  tiZANidine (ZANAFLEX) 4 MG tablet Take 1 tablet (4 mg total) by mouth every 8 (eight) hours as needed. 03/17/21   Jaynee Eagles, PA-C  verapamil (CALAN-SR) 240 MG CR tablet Take 1 tablet (240 mg total) by mouth at bedtime. 11/01/20   Lesleigh Noe, MD  XARELTO 20 MG TABS tablet TAKE 1 TABLET(20 MG) BY MOUTH DAILY WITH SUPPER 01/15/21   Lesleigh Noe, MD    Allergies    Metformin and Viagra [sildenafil  citrate]  Review of Systems   Review of Systems  Constitutional: Negative for chills and fever.  HENT: Negative for dental problem, sore throat and trouble swallowing.   Respiratory: Negative for shortness of breath.   Cardiovascular: Negative for chest pain.  Gastrointestinal: Negative for nausea and vomiting.  Musculoskeletal: Negative for neck pain and neck stiffness.  Skin: Negative for rash and wound.  Hematological: Negative for adenopathy.    Physical Exam Updated Vital Signs BP (!) 163/108 (BP Location: Right Arm)   Pulse 77   Temp 98.2 F (36.8 C) (Oral)   Resp 18   Ht 1.829 m (6')  Wt 117.9 kg   SpO2 100%   BMI 35.26 kg/m   Physical Exam Vitals and nursing note reviewed.  Constitutional:      Appearance: Normal appearance. He is well-developed.  HENT:     Head: Atraumatic.     Comments: Small tender area approximately 2-3 cm diameter to left mandible, lateral/inferior to corner of mouth. No obvious lower dental pain or abscess in area, but does have receeding gum tooth 20-21 ?source of infection. (pt does have chronically broken off upper tooth with prior dental procedure hardware in area - pt indicates been there for 'long time', no recent change, plans to f/u w his dentist). No trismus. No neck swelling or mass. Trachea midline. No l/a. Pharynx normal.     Nose: Nose normal.     Mouth/Throat:     Mouth: Mucous membranes are moist.     Pharynx: Oropharynx is clear. No oropharyngeal exudate or posterior oropharyngeal erythema.  Eyes:     General: No scleral icterus.    Conjunctiva/sclera: Conjunctivae normal.  Neck:     Trachea: No tracheal deviation.  Cardiovascular:     Rate and Rhythm: Normal rate.     Pulses: Normal pulses.  Pulmonary:     Effort: Pulmonary effort is normal. No accessory muscle usage or respiratory distress.     Breath sounds: Normal breath sounds. No stridor.  Abdominal:     General: There is no distension.     Palpations: Abdomen is  soft.     Tenderness: There is no abdominal tenderness.  Genitourinary:    Comments: No cva tenderness. Musculoskeletal:        General: No swelling.     Cervical back: Normal range of motion and neck supple. No rigidity.  Skin:    General: Skin is warm and dry.     Findings: No rash.  Neurological:     Mental Status: He is alert.     Comments: Alert, speech clear.   Psychiatric:        Mood and Affect: Mood normal.     ED Results / Procedures / Treatments   Labs (all labs ordered are listed, but only abnormal results are displayed) Labs Reviewed - No data to display  EKG None  Radiology No results found.  Procedures .Marland KitchenIncision and Drainage  Date/Time: 04/22/2021 11:20 AM Performed by: Lajean Saver, MD Authorized by: Lajean Saver, MD   Consent:    Consent given by:  Patient Location:    Type:  Abscess   Location:  Head   Head location:  Face Pre-procedure details:    Skin preparation:  Povidone-iodine Anesthesia:    Anesthesia method:  Local infiltration   Local anesthetic:  Lidocaine 2% WITH epi Procedure type:    Complexity:  Complex Procedure details:    Incision types:  Single straight   Incision depth:  Subcutaneous   Wound management:  Probed and deloculated   Drainage:  Purulent   Drainage amount:  Moderate   Wound treatment:  Wound left open   Packing materials:  None Post-procedure details:    Procedure completion:  Tolerated well, no immediate complications     Medications Ordered in ED Medications  lidocaine-EPINEPHrine (XYLOCAINE W/EPI) 2 %-1:200000 (PF) injection 20 mL (20 mLs Infiltration Given by Other 04/22/21 1039)    ED Course  I have reviewed the triage vital signs and the nursing notes.  Pertinent labs & imaging results that were available during my care of the patient were reviewed by me  and considered in my medical decision making (see chart for details).    MDM Rules/Calculators/A&P                         Reviewed  nursing notes and prior charts for additional history.   I and D of abscess.   Discussed plan warm compresses, antibiotic therapy. Also discussed dental f/u re other issues.   Return precautions provided.   Confirmed no antibiotic allergies w pt.   Acetaminophen po. augmentin po.    Final Clinical Impression(s) / ED Diagnoses Final diagnoses:  None    Rx / DC Orders ED Discharge Orders    None           Lajean Saver, MD 04/22/21 1127

## 2021-04-22 NOTE — ED Triage Notes (Signed)
Pt via pov from home with swelling in his jaw that began on Friday. He states that it began to get bigger yesterday and is now in his neck. Pt denies difficulty breathing or swallowing, mentions that he takes thyroid medications and wonders if this might be related. Denies fevers, n/v/dizziness, weakness. Pt alert & oriented, NAD noted.

## 2021-04-26 ENCOUNTER — Telehealth: Payer: Self-pay

## 2021-04-26 NOTE — Telephone Encounter (Signed)
Pt said he thought he pulled a muscle in his back several wks ago. Pt said he has gone to [facility] x 2 ( 03/17/21 and 04/04/21) pt also went to [facility] ED on 04/07/21 pt said had CT done that could not explain the pts pain. Pt said the pain radiates from back to lt abd area also but ED cked urine and no problem. Pt is requesting to have a MRI of back done. Pt said he has to have an open MRI due to closeness of the machine to pt; pt last saw Dr Einar Pheasant on 12/18/20. I offered pt 3 different appts with Dr Einar Pheasant at [facility]  And pt said he works for a company that requires at least 1 wk notice before gets off work and pt has been off or has to be off (upcoming appts with doctors) for 3 wks and employer will not let pt be off work anymore for a while. Pt wants to know if Dr Einar Pheasant will order MRI of back. Pt said pain level of back is usually around an 8. The back pain is sharp and mostly consistent. Explained that before ordering imaging or MRIs  Provider usually has to see pt. Pt said to please ask her to order and pt request cb after reviewed by Dr Einar Pheasant. [facility] [address]. UC & ED precautions given and pt voiced understanding.

## 2021-04-29 NOTE — Telephone Encounter (Signed)
He will need an appointment prior to ordering any imaging.   If her prefers a referral to orthopedic or to see Dr. Lorelei Pont that would also be fine.

## 2021-04-30 DIAGNOSIS — N200 Calculus of kidney: Secondary | ICD-10-CM | POA: Diagnosis not present

## 2021-04-30 DIAGNOSIS — E349 Endocrine disorder, unspecified: Secondary | ICD-10-CM | POA: Diagnosis not present

## 2021-04-30 DIAGNOSIS — N529 Male erectile dysfunction, unspecified: Secondary | ICD-10-CM | POA: Diagnosis not present

## 2021-04-30 DIAGNOSIS — Z125 Encounter for screening for malignant neoplasm of prostate: Secondary | ICD-10-CM | POA: Diagnosis not present

## 2021-05-01 ENCOUNTER — Emergency Department (HOSPITAL_BASED_OUTPATIENT_CLINIC_OR_DEPARTMENT_OTHER)
Admission: EM | Admit: 2021-05-01 | Discharge: 2021-05-01 | Disposition: A | Discharge status: Home / Self Care | Payer: BC Managed Care – PPO | Attending: Emergency Medicine | Admitting: Emergency Medicine

## 2021-05-01 ENCOUNTER — Encounter (HOSPITAL_BASED_OUTPATIENT_CLINIC_OR_DEPARTMENT_OTHER): Payer: Self-pay

## 2021-05-01 ENCOUNTER — Emergency Department (HOSPITAL_BASED_OUTPATIENT_CLINIC_OR_DEPARTMENT_OTHER): Payer: BC Managed Care – PPO

## 2021-05-01 ENCOUNTER — Other Ambulatory Visit: Payer: Self-pay

## 2021-05-01 DIAGNOSIS — Z7901 Long term (current) use of anticoagulants: Secondary | ICD-10-CM | POA: Diagnosis not present

## 2021-05-01 DIAGNOSIS — F17290 Nicotine dependence, other tobacco product, uncomplicated: Secondary | ICD-10-CM | POA: Insufficient documentation

## 2021-05-01 DIAGNOSIS — Z7984 Long term (current) use of oral hypoglycemic drugs: Secondary | ICD-10-CM | POA: Insufficient documentation

## 2021-05-01 DIAGNOSIS — E119 Type 2 diabetes mellitus without complications: Secondary | ICD-10-CM | POA: Insufficient documentation

## 2021-05-01 DIAGNOSIS — G8929 Other chronic pain: Secondary | ICD-10-CM | POA: Insufficient documentation

## 2021-05-01 DIAGNOSIS — I10 Essential (primary) hypertension: Secondary | ICD-10-CM | POA: Diagnosis not present

## 2021-05-01 DIAGNOSIS — M546 Pain in thoracic spine: Secondary | ICD-10-CM | POA: Diagnosis not present

## 2021-05-01 DIAGNOSIS — R0789 Other chest pain: Secondary | ICD-10-CM | POA: Insufficient documentation

## 2021-05-01 DIAGNOSIS — Z79899 Other long term (current) drug therapy: Secondary | ICD-10-CM | POA: Insufficient documentation

## 2021-05-01 DIAGNOSIS — R079 Chest pain, unspecified: Secondary | ICD-10-CM | POA: Diagnosis not present

## 2021-05-01 LAB — LIPASE, BLOOD: Lipase: 43 U/L (ref 11–51)

## 2021-05-01 LAB — CBC WITH DIFFERENTIAL/PLATELET
Abs Immature Granulocytes: 0.01 10*3/uL (ref 0.00–0.07)
Basophils Absolute: 0.1 10*3/uL (ref 0.0–0.1)
Basophils Relative: 1 %
Eosinophils Absolute: 0.2 10*3/uL (ref 0.0–0.5)
Eosinophils Relative: 5 %
HCT: 42.5 % (ref 39.0–52.0)
Hemoglobin: 13.7 g/dL (ref 13.0–17.0)
Immature Granulocytes: 0 %
Lymphocytes Relative: 27 %
Lymphs Abs: 1.2 10*3/uL (ref 0.7–4.0)
MCH: 27.1 pg (ref 26.0–34.0)
MCHC: 32.2 g/dL (ref 30.0–36.0)
MCV: 84.2 fL (ref 80.0–100.0)
Monocytes Absolute: 0.3 10*3/uL (ref 0.1–1.0)
Monocytes Relative: 7 %
Neutro Abs: 2.6 10*3/uL (ref 1.7–7.7)
Neutrophils Relative %: 60 %
Platelets: 195 10*3/uL (ref 150–400)
RBC: 5.05 MIL/uL (ref 4.22–5.81)
RDW: 16.1 % — ABNORMAL HIGH (ref 11.5–15.5)
WBC: 4.4 10*3/uL (ref 4.0–10.5)
nRBC: 0 % (ref 0.0–0.2)

## 2021-05-01 LAB — COMPREHENSIVE METABOLIC PANEL
ALT: 18 U/L (ref 0–44)
AST: 17 U/L (ref 15–41)
Albumin: 4 g/dL (ref 3.5–5.0)
Alkaline Phosphatase: 65 U/L (ref 38–126)
Anion gap: 11 (ref 5–15)
BUN: 10 mg/dL (ref 6–20)
CO2: 25 mmol/L (ref 22–32)
Calcium: 9.1 mg/dL (ref 8.9–10.3)
Chloride: 103 mmol/L (ref 98–111)
Creatinine, Ser: 1.15 mg/dL (ref 0.61–1.24)
GFR, Estimated: >60 mL/min (ref >60)
Glucose, Bld: 227 mg/dL — ABNORMAL HIGH (ref 70–99)
Potassium: 3.5 mmol/L (ref 3.5–5.1)
Sodium: 139 mmol/L (ref 135–145)
Total Bilirubin: 0.8 mg/dL (ref 0.3–1.2)
Total Protein: 7.1 g/dL (ref 6.5–8.1)

## 2021-05-01 LAB — URINALYSIS, ROUTINE W REFLEX MICROSCOPIC
Bilirubin Urine: NEGATIVE
Glucose, UA: 250 mg/dL — AB
Hgb urine dipstick: NEGATIVE
Ketones, ur: NEGATIVE mg/dL
Leukocytes,Ua: NEGATIVE
Nitrite: NEGATIVE
Protein, ur: NEGATIVE mg/dL
Specific Gravity, Urine: 1.012 (ref 1.005–1.030)
pH: 6 (ref 5.0–8.0)

## 2021-05-01 MED ORDER — ACETAMINOPHEN 500 MG PO TABS: 1000.0000 mg | ORAL_TABLET | Freq: Four times a day (QID) | ORAL | 0 refills | Status: DC | PRN

## 2021-05-01 MED ORDER — LACTATED RINGERS IV SOLN: INTRAVENOUS | Status: DC

## 2021-05-01 MED ORDER — KETOROLAC TROMETHAMINE 30 MG/ML IJ SOLN
30.0000 mg | Freq: Once | INTRAMUSCULAR | Status: AC
  Administered 2021-05-01: 30 mg via INTRAVENOUS
  Filled 2021-05-01: qty 1

## 2021-05-01 MED ORDER — IOHEXOL 350 MG/ML SOLN
75.0000 mL | Freq: Once | INTRAVENOUS | Status: AC | PRN
  Administered 2021-05-01: 75 mL via INTRAVENOUS

## 2021-05-01 MED ORDER — TRAMADOL HCL 50 MG PO TABS
50.0000 mg | ORAL_TABLET | Freq: Once | ORAL | Status: AC
  Administered 2021-05-01: 50 mg via ORAL
  Filled 2021-05-01: qty 1

## 2021-05-01 MED ORDER — TRAMADOL HCL 50 MG PO TABS: ORAL_TABLET | ORAL | 0 refills | Status: DC

## 2021-05-01 MED ORDER — HYDROMORPHONE HCL 1 MG/ML IJ SOLN
1.0000 mg | Freq: Once | INTRAMUSCULAR | Status: AC
  Administered 2021-05-01: 1 mg via INTRAVENOUS
  Filled 2021-05-01: qty 1

## 2021-05-01 NOTE — Discharge Instructions (Signed)
1.  At this time it appears likely that the pain in your upper back is due to muscular overuse or possibly a pinched nerve. 2.  You may benefit from physical therapy and referral to a pain clinic.  Discuss this with your family doctor. 3.  Take extra strength Tylenol, 2 tablets (1000 mg ) every 6 hours for pain.  Take 1-2 tramadol tablets with the Tylenol. 4.  Try applying an over-the-counter lidocaine patch.  You may also try variably warm packs or ice packs, whichever is more comfortable for you.

## 2021-05-01 NOTE — ED Provider Notes (Signed)
[facility] EMERGENCY DEPT Provider Note   CSN: [id number] Arrival date & time: 05/01/21  1736     History Chief Complaint  Patient presents with  . Back Pain    Spencer English is a 55 y.o. male.  HPI Patient reports he has been having almost over a month of problems with pain in his left back.  He indicates his left thoracic back slightly below the shoulder blade into the medial aspect.  He rubs his hands across his lower chest wall and lower anterior thoracic chest wall as the area of pain.  He reports it feels like he is got a knife like pain in that part of his back.  Sometimes it radiates around somewhat to the front.  He reports that nothing he has tried thus far it has really alleviated the symptoms.  He has been tried on anti-inflammatories and muscle relaxers.  He denies he has chest pain.  He denies he feels significantly short of breath.  He denies fever or cough.  Denies vomiting or abdominal pain.  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  He reports he sits in the truck almost all day long.  He reports by the end of the day his back seems really cramped and spasmed up.  He reports often there is no comfortable position for it.    Past Medical History:  Diagnosis Date  . ALLERGIC RHINITIS 04/12/2009  . Allergy   . Anxiety   . Arthritis   . Atrial fibrillation ([place])   . BACK PAIN, LUMBAR 01/14/2011  . DEEP VENOUS THROMBOPHLEBITIS, LEG, LEFT 05/05/2009  . DM 08/08/2008  . DYSLIPIDEMIA 04/26/2009  . ECZEMA 05/23/2010  . 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 GERD (gastroesophageal reflux disease)   . GOUT 10/24/2010  . Hyperglycemia   . HYPERTENSION 07/21/2007  . 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 Leukopenia   . Long term (current) use of anticoagulants 04/03/2011  . PULMONARY EMBOLISM 05/03/2009  . Sleep apnea    CPAP at bedtime  . UNSPECIFIED URINARY CALCULUS     Patient Active Problem List   Diagnosis Date Noted  . Umbilical hernia without  obstruction and without gangrene 12/18/2020  . Hyperlipidemia associated with type 2 diabetes mellitus ([place]) 11/01/2020  . History of pulmonary embolus (PE) 12/17/2018  . OSA on CPAP 06/04/2017  . Obese 06/04/2017  . Moderate mitral regurgitation 06/26/2014  . Pulmonary edema 02/01/2013  . Hyperthyroidism 01/27/2013  . Moderate to severe pulmonary hypertension ([place]) 01/26/2013  . Microcytic anemia 01/25/2013  . Thrombocytopenia ([place]) 01/25/2013  . History of septic arthritis 01/23/2013  . Atrial fibrillation ([place])   . Encounter for long-term (current) use of other medications 09/06/2012  . Pre-employment examination 10/11/2011  . Screening examination for infectious disease 06/16/2011  . Encounter for long-term (current) use of anticoagulants 04/03/2011  . Gout 10/24/2010  . UNSPECIFIED URINARY CALCULUS 07/04/2010  . ECZEMA 05/23/2010  . 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 Chronic pulmonary embolism ([place]) 05/03/2009  . DYSLIPIDEMIA 04/26/2009  . Allergic rhinitis 04/12/2009  . Pain in joint, ankle and foot 04/12/2009  . Type 2 diabetes mellitus with other specified complication ([place]) 29/92/4268  . 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 SEBACEOUS CYST 01/31/2008  . Essential hypertension 07/21/2007    Past Surgical History:  Procedure Laterality Date  . ANTERIOR CRUCIATE LIGAMENT REPAIR  2000   Right  . CYSTECTOMY     back of head  . KNEE ARTHROSCOPY     left knee  .  KNEE ARTHROSCOPY  01/03/2013   Procedure: ARTHROSCOPY KNEE;  Surgeon: Yvette Rack., MD;  Location: [facility];  Service: Orthopedics;  Laterality: Left;  Lavage Synovectomy, Removal of loose body  . KNEE ARTHROSCOPY W/ ACL RECONSTRUCTION     right knee       Family History  Problem Relation Age of Onset  . Diabetes Mother   . Hypertension Mother   . Hyperlipidemia Mother   . Dementia Mother   . Diabetes Father   . Hypertension Father   . Hyperlipidemia Father   . Heart attack Father   . Heart disease  Father   . Prostate cancer Father 29  . Stomach cancer Neg Hx   . Colon cancer Neg Hx   . Pancreatic cancer Neg Hx   . Esophageal cancer Neg Hx   . Rectal cancer Neg Hx     Social History   Tobacco Use  . Smoking status: Light Tobacco Smoker    Types: Cigars    Last attempt to quit: 07/29/2018    Years since quitting: 2.7  . Smokeless tobacco: Never Used  . Tobacco comment: 2 per week  Vaping Use  . Vaping Use: Never used  Substance Use Topics  . Alcohol use: Yes    Comment: once week  . Drug use: No    Home Medications Prior to Admission medications   Medication Sig Start Date End Date Taking? Authorizing Provider  acetaminophen (TYLENOL) 500 MG tablet Take 2 tablets (1,000 mg total) by mouth every 6 (six) hours as needed. 05/01/21  Yes Charlesetta Shanks, MD  traMADol (ULTRAM) 50 MG tablet 1-2 tablets every 6 hours as needed for pain 05/01/21  Yes Destinee Taber, Jeannie Done, MD  allopurinol (ZYLOPRIM) 300 MG tablet Take 300 mg by mouth as needed (gout flare up).    [provider]  amoxicillin-clavulanate (AUGMENTIN) 875-125 MG tablet Take 1 tablet by mouth 2 (two) times daily. 04/22/21   Lajean Saver, MD  atorvastatin (LIPITOR) 40 MG tablet Take 1 tablet (40 mg total) by mouth daily. 12/20/20   Lesleigh Noe, MD  dapagliflozin propanediol (FARXIGA) 5 MG TABS tablet Take 1 tablet (5 mg total) by mouth daily. 11/01/20   Lesleigh Noe, MD  fluticasone (FLONASE) 50 MCG/ACT nasal spray Place 1 spray into both nostrils daily. allergies    [provider]  furosemide (LASIX) 40 MG tablet TAKE 1 TABLET(40 MG) BY MOUTH DAILY 01/30/21   Lesleigh Noe, MD  glucose blood (CONTOUR NEXT TEST) test strip 1 each by Other route daily. And lancets 1/day 09/02/19   Renato Shin, MD  halobetasol (ULTRAVATE) 0.05 % cream APPLY CREAM TOPICALLY THREE TIMES DAILY AS NEEDED FOR RASH 12/02/18   Renato Shin, MD  losartan (COZAAR) 100 MG tablet Take 1 tablet (100 mg total) by mouth daily. 11/01/20    Lesleigh Noe, MD  methimazole (TAPAZOLE) 10 MG tablet TAKE 1 TABLET(10 MG) BY MOUTH DAILY 03/20/21   Lesleigh Noe, MD  metoprolol succinate (TOPROL-XL) 50 MG 24 hr tablet Take 1 tablet (50 mg total) by mouth daily. 12/18/20   Lesleigh Noe, MD  predniSONE (DELTASONE) 20 MG tablet Take 2 tablets (40 mg total) by mouth daily. 04/07/21   Davonna Belling, MD  sitaGLIPtin (JANUVIA) 100 MG tablet Take 1 tablet (100 mg total) by mouth daily. 12/18/20   Lesleigh Noe, MD  tiZANidine (ZANAFLEX) 4 MG tablet Take 1 tablet (4 mg total) by mouth every 8 (eight) hours as needed.  03/17/21   Jaynee Eagles, PA-C  verapamil (CALAN-SR) 240 MG CR tablet Take 1 tablet (240 mg total) by mouth at bedtime. 11/01/20   Lesleigh Noe, MD  XARELTO 20 MG TABS tablet TAKE 1 TABLET(20 MG) BY MOUTH DAILY WITH SUPPER 01/15/21   Lesleigh Noe, MD    Allergies    Metformin and Viagra [sildenafil citrate]  Review of Systems   Review of Systems 10 systems reviewed and negative except as per HPI Physical Exam Updated Vital Signs BP 133/89 (BP Location: Right Arm)   Pulse (!) 54   Temp 98.9 F (37.2 C) (Oral)   Resp 18   Ht 6' (1.829 m)   Wt 122.6 kg   SpO2 97%   BMI 36.65 kg/m   Physical Exam Constitutional:      Appearance: He is well-developed.  HENT:     Head: Normocephalic and atraumatic.  Eyes:     Extraocular Movements: Extraocular movements intact.     Conjunctiva/sclera: Conjunctivae normal.  Cardiovascular:     Rate and Rhythm: Normal rate and regular rhythm.     Heart sounds: Normal heart sounds.  Pulmonary:     Effort: Pulmonary effort is normal.     Breath sounds: Normal breath sounds.  Abdominal:     General: Bowel sounds are normal. There is no distension.     Palpations: Abdomen is soft.     Tenderness: There is no abdominal tenderness.  Musculoskeletal:        General: No swelling. Normal range of motion.     Cervical back: Neck supple.     Comments: Minimally reproducible area  of tenderness in the left thoracic back in the paraspinous muscle bodies slightly medial to the scapula point.  No soft tissue abnormalities in this area.  Skin:    General: Skin is warm and dry.  Neurological:     General: No focal deficit present.     Mental Status: He is alert and oriented to person, place, and time.     GCS: GCS eye subscore is 4. GCS verbal subscore is 5. GCS motor subscore is 6.     Coordination: Coordination normal.  Psychiatric:        Mood and Affect: Mood normal.     ED Results / Procedures / Treatments   Labs (all labs ordered are listed, but only abnormal results are displayed) Labs Reviewed  COMPREHENSIVE METABOLIC PANEL - Abnormal; Notable for the following components:      Result Value   Glucose, Bld 227 (*)    All other components within normal limits  CBC WITH DIFFERENTIAL/PLATELET - Abnormal; Notable for the following components:   RDW 16.1 (*)    All other components within normal limits  URINALYSIS, ROUTINE W REFLEX MICROSCOPIC - Abnormal; Notable for the following components:   Color, Urine COLORLESS (*)    Glucose, UA 250 (*)    All other components within normal limits  LIPASE, BLOOD    EKG None  Radiology CT Angio Chest PE W/Cm &/Or Wo Cm  Result Date: 05/01/2021 CLINICAL DATA:  Chest pain. EXAM: CT ANGIOGRAPHY CHEST WITH CONTRAST TECHNIQUE: Multidetector CT imaging of the chest was performed using the standard protocol during bolus administration of intravenous contrast. Multiplanar CT image reconstructions and MIPs were obtained to evaluate the vascular anatomy. CONTRAST:  75 mL OMNIPAQUE IOHEXOL 350 MG/ML SOLN COMPARISON:  CT chest 10/06/2018.  PA and lateral chest 09/18/2018. FINDINGS: Cardiovascular: Satisfactory opacification of the pulmonary arteries to the  segmental level. No evidence of pulmonary embolism. Normal heart size. No pericardial effusion. The main pulmonary artery measures 3.7 cm in diameter compared to 3.4 cm on the  prior exam. Calcific coronary artery disease is seen. A few atherosclerotic calcifications of the aorta also noted. Mediastinum/Nodes: No enlarged mediastinal, hilar, or axillary lymph nodes. Thyroid gland, trachea, and esophagus demonstrate no significant findings. Lungs/Pleura: Lungs are clear. No pleural effusion or pneumothorax. Upper Abdomen: Negative. Musculoskeletal: No acute or focal abnormality. Review of the MIP images confirms the above findings. IMPRESSION: Negative for pulmonary embolus or acute disease. Slight increase in the size of the pulmonary outflow tract since the prior CT consistent with this patient's history of pulmonary arterial hypertension. Calcific coronary artery disease. Aortic Atherosclerosis (ICD10-I70.0). Electronically Signed   By: Inge Rise M.D.   On: 05/01/2021 20:39    Procedures Procedures   Medications Ordered in ED Medications  lactated ringers infusion ( Intravenous New Bag/Given 05/01/21 1858)  HYDROmorphone (DILAUDID) injection 1 mg (1 mg Intravenous Given 05/01/21 1859)  ketorolac (TORADOL) 30 MG/ML injection 30 mg (30 mg Intravenous Given 05/01/21 1858)  iohexol (OMNIPAQUE) 350 MG/ML injection 75 mL (75 mLs Intravenous Contrast Given 05/01/21 2003)    ED Course  I have reviewed the triage vital signs and the nursing notes.  Pertinent labs & imaging results that were available during my care of the patient were reviewed by me and considered in my medical decision making (see chart for details).    MDM Rules/Calculators/A&P                          Patient has had now recurrent problems with left-sided thoracic back pain.  Today, he is very much localizing the pain to the medial aspect of the scapula in the lower thoracic back.  He describes the pain as stabbing quality.  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  History of PE as documented on the chart.  PE study obtained and no PE present.  No other acute finding identified.  With PE  and other intrathoracic etiology ruled out, I have high suspicion for either musculoskeletal pain or possibly nerve impingement.  Patient is otherwise well without other positives on review of systems.  At this time plan will be to take acetaminophen 1000 mg every 6 hours and 1-2 tramadol tablets.  At this time we are avoiding steroids as patient has previously experienced hyperglycemia with steroids and also avoiding prolonged NSAIDs due to hypertension.  Plan will be for follow-up with patient's PCP to discuss other treatment options including referral to pain management and further diagnostic evaluation for possible nerve impingement.  Final Clinical Impression(s) / ED Diagnoses Final diagnoses:  Chronic left-sided thoracic back pain    Rx / DC Orders ED Discharge Orders         Ordered    acetaminophen (TYLENOL) 500 MG tablet  Every 6 hours PRN        05/01/21 2123    traMADol (ULTRAM) 50 MG tablet        05/01/21 2123           Charlesetta Shanks, MD 05/01/21 2134

## 2021-05-01 NOTE — ED Triage Notes (Signed)
Pt presents with L side flank pain x 2 months. Pain is worse with bending over. Denies any urinary symptoms. Pt received labs and CT 3 weeks ago and was diagnosed with muscle pain. Pt was given a muscle relaxer and pt reports no relief.

## 2021-05-01 NOTE — ED Notes (Signed)
Adam - Charge RN aware of pt's BP.

## 2021-05-02 NOTE — Telephone Encounter (Signed)
Pt scheduled to see Dr. Diona Browner on 5/6

## 2021-05-03 ENCOUNTER — Ambulatory Visit: Payer: BC Managed Care – PPO | Admitting: Family Medicine

## 2021-05-07 ENCOUNTER — Emergency Department (HOSPITAL_BASED_OUTPATIENT_CLINIC_OR_DEPARTMENT_OTHER)
Admission: EM | Admit: 2021-05-07 | Discharge: 2021-05-07 | Disposition: A | Discharge status: Home / Self Care | Payer: BC Managed Care – PPO | Attending: Emergency Medicine | Admitting: Emergency Medicine

## 2021-05-07 ENCOUNTER — Emergency Department (HOSPITAL_BASED_OUTPATIENT_CLINIC_OR_DEPARTMENT_OTHER): Payer: BC Managed Care – PPO

## 2021-05-07 ENCOUNTER — Encounter (HOSPITAL_BASED_OUTPATIENT_CLINIC_OR_DEPARTMENT_OTHER): Payer: Self-pay

## 2021-05-07 ENCOUNTER — Other Ambulatory Visit: Payer: Self-pay

## 2021-05-07 DIAGNOSIS — R079 Chest pain, unspecified: Secondary | ICD-10-CM | POA: Diagnosis not present

## 2021-05-07 DIAGNOSIS — E1169 Type 2 diabetes mellitus with other specified complication: Secondary | ICD-10-CM | POA: Insufficient documentation

## 2021-05-07 DIAGNOSIS — Z79899 Other long term (current) drug therapy: Secondary | ICD-10-CM | POA: Diagnosis not present

## 2021-05-07 DIAGNOSIS — F17290 Nicotine dependence, other tobacco product, uncomplicated: Secondary | ICD-10-CM | POA: Diagnosis not present

## 2021-05-07 DIAGNOSIS — R109 Unspecified abdominal pain: Secondary | ICD-10-CM

## 2021-05-07 DIAGNOSIS — R10A2 Flank pain, left side: Secondary | ICD-10-CM

## 2021-05-07 DIAGNOSIS — E785 Hyperlipidemia, unspecified: Secondary | ICD-10-CM | POA: Diagnosis not present

## 2021-05-07 DIAGNOSIS — Z7901 Long term (current) use of anticoagulants: Secondary | ICD-10-CM | POA: Diagnosis not present

## 2021-05-07 DIAGNOSIS — I4891 Unspecified atrial fibrillation: Secondary | ICD-10-CM | POA: Diagnosis not present

## 2021-05-07 DIAGNOSIS — I10 Essential (primary) hypertension: Secondary | ICD-10-CM | POA: Diagnosis not present

## 2021-05-07 DIAGNOSIS — N200 Calculus of kidney: Secondary | ICD-10-CM | POA: Diagnosis not present

## 2021-05-07 LAB — URINALYSIS, ROUTINE W REFLEX MICROSCOPIC
Bilirubin Urine: NEGATIVE
Glucose, UA: NEGATIVE mg/dL
Hgb urine dipstick: NEGATIVE
Ketones, ur: NEGATIVE mg/dL
Leukocytes,Ua: NEGATIVE
Nitrite: NEGATIVE
Protein, ur: NEGATIVE mg/dL
Specific Gravity, Urine: 1.01 (ref 1.005–1.030)
pH: 6 (ref 5.0–8.0)

## 2021-05-07 LAB — COMPREHENSIVE METABOLIC PANEL
ALT: 19 U/L (ref 0–44)
AST: 18 U/L (ref 15–41)
Albumin: 3.8 g/dL (ref 3.5–5.0)
Alkaline Phosphatase: 71 U/L (ref 38–126)
Anion gap: 9 (ref 5–15)
BUN: 11 mg/dL (ref 6–20)
CO2: 26 mmol/L (ref 22–32)
Calcium: 8.6 mg/dL — ABNORMAL LOW (ref 8.9–10.3)
Chloride: 100 mmol/L (ref 98–111)
Creatinine, Ser: 1.3 mg/dL — ABNORMAL HIGH (ref 0.61–1.24)
GFR, Estimated: >60 mL/min (ref >60)
Glucose, Bld: 116 mg/dL — ABNORMAL HIGH (ref 70–99)
Potassium: 3.7 mmol/L (ref 3.5–5.1)
Sodium: 135 mmol/L (ref 135–145)
Total Bilirubin: 0.9 mg/dL (ref 0.3–1.2)
Total Protein: 7.6 g/dL (ref 6.5–8.1)

## 2021-05-07 LAB — CBC WITH DIFFERENTIAL/PLATELET
Abs Immature Granulocytes: 0.01 10*3/uL (ref 0.00–0.07)
Basophils Absolute: 0.1 10*3/uL (ref 0.0–0.1)
Basophils Relative: 1 %
Eosinophils Absolute: 0.2 10*3/uL (ref 0.0–0.5)
Eosinophils Relative: 5 %
HCT: 42.8 % (ref 39.0–52.0)
Hemoglobin: 13.9 g/dL (ref 13.0–17.0)
Immature Granulocytes: 0 %
Lymphocytes Relative: 33 %
Lymphs Abs: 1.5 10*3/uL (ref 0.7–4.0)
MCH: 27.4 pg (ref 26.0–34.0)
MCHC: 32.5 g/dL (ref 30.0–36.0)
MCV: 84.3 fL (ref 80.0–100.0)
Monocytes Absolute: 0.6 10*3/uL (ref 0.1–1.0)
Monocytes Relative: 13 %
Neutro Abs: 2.1 10*3/uL (ref 1.7–7.7)
Neutrophils Relative %: 48 %
Platelets: 206 10*3/uL (ref 150–400)
RBC: 5.08 MIL/uL (ref 4.22–5.81)
RDW: 15.9 % — ABNORMAL HIGH (ref 11.5–15.5)
WBC: 4.5 10*3/uL (ref 4.0–10.5)
nRBC: 0 % (ref 0.0–0.2)

## 2021-05-07 LAB — TROPONIN I (HIGH SENSITIVITY): Troponin I (High Sensitivity): 5 ng/L (ref <18)

## 2021-05-07 NOTE — ED Provider Notes (Signed)
[facility] EMERGENCY DEPARTMENT Provider Note   CSN: [id number] Arrival date & time: 05/07/21  1748     History Chief Complaint  Patient presents with  . Chest Pain  . Flank Pain    TORI CUPPS is a 55 y.o. male.  The history is provided by the patient and medical records.  Chest Pain Flank Pain Associated symptoms include chest pain.   CHRISTOHPER DUBE is a 55 y.o. male who presents to the Emergency Department complaining of flank pain.  He presents to the ED complaining of left flank pain that has been present for the last two months.  Pain was initially like a pulled muscle, now a constant ache.  Pain is under the ribs on the left, radiates to left low back and radiates to LLQ.  Pain is worse with bending, has difficulty straightening back up.    Has associated left sided chest pain, started four days, described as aching, comes and goes.    No fever, sob, n/v/d.  States urine is dark.  No dysuria.  No numbness/weakness in legs.  No incontinence.    Has a hx/o DM, HTN, cholesterol, DVT.  Takes xarelto.      Past Medical History:  Diagnosis Date  . ALLERGIC RHINITIS 04/12/2009  . Allergy   . Anxiety   . Arthritis   . Atrial fibrillation ([place])   . BACK PAIN, LUMBAR 01/14/2011  . DEEP VENOUS THROMBOPHLEBITIS, LEG, LEFT 05/05/2009  . DM 08/08/2008  . DYSLIPIDEMIA 04/26/2009  . ECZEMA 05/23/2010  . 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 GERD (gastroesophageal reflux disease)   . GOUT 10/24/2010  . Hyperglycemia   . HYPERTENSION 07/21/2007  . 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 Leukopenia   . Long term (current) use of anticoagulants 04/03/2011  . PULMONARY EMBOLISM 05/03/2009  . Sleep apnea    CPAP at bedtime  . UNSPECIFIED URINARY CALCULUS     Patient Active Problem List   Diagnosis Date Noted  . Umbilical hernia without obstruction and without gangrene 12/18/2020  . Hyperlipidemia associated with type 2 diabetes mellitus ([place]) 11/01/2020  . History of pulmonary  embolus (PE) 12/17/2018  . OSA on CPAP 06/04/2017  . Obese 06/04/2017  . Moderate mitral regurgitation 06/26/2014  . Pulmonary edema 02/01/2013  . Hyperthyroidism 01/27/2013  . Moderate to severe pulmonary hypertension ([place]) 01/26/2013  . Microcytic anemia 01/25/2013  . Thrombocytopenia ([place]) 01/25/2013  . History of septic arthritis 01/23/2013  . Atrial fibrillation ([place])   . Encounter for long-term (current) use of other medications 09/06/2012  . Pre-employment examination 10/11/2011  . Screening examination for infectious disease 06/16/2011  . Encounter for long-term (current) use of anticoagulants 04/03/2011  . Gout 10/24/2010  . UNSPECIFIED URINARY CALCULUS 07/04/2010  . ECZEMA 05/23/2010  . 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 DYSLIPIDEMIA 04/26/2009  . Allergic rhinitis 04/12/2009  . Pain in joint, ankle and foot 04/12/2009  . Type 2 diabetes mellitus with other specified complication ([place]) 82/42/3536  . 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 SEBACEOUS CYST 01/31/2008  . Essential hypertension 07/21/2007    Past Surgical History:  Procedure Laterality Date  . ANTERIOR CRUCIATE LIGAMENT REPAIR  2000   Right  . CYSTECTOMY     back of head  . KNEE ARTHROSCOPY     left knee  . KNEE ARTHROSCOPY  01/03/2013   Procedure: ARTHROSCOPY KNEE;  Surgeon: Yvette Rack., MD;  Location: [place];  Service: Orthopedics;  Laterality: Left;  Lavage Synovectomy, Removal of loose body  . KNEE ARTHROSCOPY W/ ACL RECONSTRUCTION     right knee       Family History  Problem Relation Age of Onset  . Diabetes Mother   . Hypertension Mother   . Hyperlipidemia Mother   . Dementia Mother   . Diabetes Father   . Hypertension Father   . Hyperlipidemia Father   . Heart attack Father   . Heart disease Father   . Prostate cancer Father 94  . Stomach cancer Neg Hx   . Colon cancer Neg Hx   . Pancreatic cancer Neg Hx   . Esophageal cancer Neg Hx    . Rectal cancer Neg Hx     Social History   Tobacco Use  . Smoking status: Current Some Day Smoker    Types: Cigars  . Smokeless tobacco: Never Used  Vaping Use  . Vaping Use: Never used  Substance Use Topics  . Alcohol use: Yes    Comment: weekends  . Drug use: No    Home Medications Prior to Admission medications   Medication Sig Start Date End Date Taking? Authorizing Provider  acetaminophen (TYLENOL) 500 MG tablet Take 2 tablets (1,000 mg total) by mouth every 6 (six) hours as needed. 05/01/21   Charlesetta Shanks, MD  allopurinol (ZYLOPRIM) 300 MG tablet Take 300 mg by mouth as needed (gout flare up).    [provider]  amoxicillin-clavulanate (AUGMENTIN) 875-125 MG tablet Take 1 tablet by mouth 2 (two) times daily. 04/22/21   Lajean Saver, MD  atorvastatin (LIPITOR) 40 MG tablet Take 1 tablet (40 mg total) by mouth daily. 12/20/20   Lesleigh Noe, MD  dapagliflozin propanediol (FARXIGA) 5 MG TABS tablet Take 1 tablet (5 mg total) by mouth daily. 11/01/20   Lesleigh Noe, MD  fluticasone (FLONASE) 50 MCG/ACT nasal spray Place 1 spray into both nostrils daily. allergies    [provider]  furosemide (LASIX) 40 MG tablet TAKE 1 TABLET(40 MG) BY MOUTH DAILY 01/30/21   Lesleigh Noe, MD  glucose blood (CONTOUR NEXT TEST) test strip 1 each by Other route daily. And lancets 1/day 09/02/19   Renato Shin, MD  halobetasol (ULTRAVATE) 0.05 % cream APPLY CREAM TOPICALLY THREE TIMES DAILY AS NEEDED FOR RASH 12/02/18   Renato Shin, MD  losartan (COZAAR) 100 MG tablet Take 1 tablet (100 mg total) by mouth daily. 11/01/20   Lesleigh Noe, MD  methimazole (TAPAZOLE) 10 MG tablet TAKE 1 TABLET(10 MG) BY MOUTH DAILY 03/20/21   Lesleigh Noe, MD  metoprolol succinate (TOPROL-XL) 50 MG 24 hr tablet Take 1 tablet (50 mg total) by mouth daily. 12/18/20   Lesleigh Noe, MD  predniSONE (DELTASONE) 20 MG tablet Take 2 tablets (40 mg total) by mouth daily. 04/07/21   Davonna Belling, MD  sitaGLIPtin (JANUVIA) 100 MG tablet Take 1 tablet (100 mg total) by mouth daily. 12/18/20   Lesleigh Noe, MD  tiZANidine (ZANAFLEX) 4 MG tablet Take 1 tablet (4 mg total) by mouth every 8 (eight) hours as needed. 03/17/21   Jaynee Eagles, PA-C  traMADol Veatrice Bourbon) 50 MG tablet 1-2 tablets every 6 hours as needed for pain 05/01/21   Charlesetta Shanks, MD  verapamil (CALAN-SR) 240 MG CR tablet Take 1 tablet (240 mg total) by mouth at bedtime. 11/01/20   Lesleigh Noe, MD  XARELTO 20 MG TABS tablet TAKE 1 TABLET(20 MG) BY MOUTH DAILY WITH SUPPER 01/15/21   Einar Pheasant,  Jobe Marker, MD    Allergies    Metformin and Viagra [sildenafil citrate]  Review of Systems   Review of Systems  Cardiovascular: Positive for chest pain.  Genitourinary: Positive for flank pain.  All other systems reviewed and are negative.   Physical Exam Updated Vital Signs BP (!) 166/100   Pulse 71   Temp 98.6 F (37 C) (Oral)   Resp (!) 26   Ht 6' (1.829 m)   Wt 117.9 kg   SpO2 99%   BMI 35.26 kg/m   Physical Exam Vitals and nursing note reviewed.  Constitutional:      Appearance: He is well-developed.  HENT:     Head: Normocephalic and atraumatic.  Cardiovascular:     Rate and Rhythm: Normal rate and regular rhythm.     Heart sounds: No murmur heard.   Pulmonary:     Effort: Pulmonary effort is normal. No respiratory distress.     Breath sounds: Normal breath sounds.  Abdominal:     Palpations: Abdomen is soft.     Tenderness: There is no abdominal tenderness. There is no guarding or rebound.     Comments: Umbilical hernia, soft and easily reduced  Musculoskeletal:     Comments: Mild left sided cva tenderness without rash2+ DP pulses bilaterally  Skin:    General: Skin is warm and dry.  Neurological:     Mental Status: He is alert and oriented to person, place, and time.     Comments: 5/5 strength in BLE  Psychiatric:        Behavior: Behavior normal.     ED Results / Procedures / Treatments    Labs (all labs ordered are listed, but only abnormal results are displayed) Labs Reviewed  COMPREHENSIVE METABOLIC PANEL - Abnormal; Notable for the following components:      Result Value   Glucose, Bld 116 (*)    Creatinine, Ser 1.30 (*)    Calcium 8.6 (*)    All other components within normal limits  CBC WITH DIFFERENTIAL/PLATELET - Abnormal; Notable for the following components:   RDW 15.9 (*)    All other components within normal limits  URINALYSIS, ROUTINE W REFLEX MICROSCOPIC  TROPONIN I (HIGH SENSITIVITY)    EKG EKG Interpretation  Date/Time:  Tuesday May 07 2021 17:57:37 EDT Ventricular Rate:  74 PR Interval:  138 QRS Duration: 76 QT Interval:  410 QTC Calculation: 455 R Axis:   -6 Text Interpretation: Normal sinus rhythm Nonspecific T wave abnormality Abnormal ECG Confirmed by Quintella Reichert [phone]) on 05/07/2021 6:12:44 PM   Radiology DG Chest 2 View  Result Date: 05/07/2021 CLINICAL DATA:  Chest pain EXAM: CHEST - 2 VIEW COMPARISON:  09/18/2018 FINDINGS: The heart size and mediastinal contours are within normal limits. Both lungs are clear. Degenerative changes of the spine. IMPRESSION: No active cardiopulmonary disease. Electronically Signed   By: Donavan Foil M.D.   On: 05/07/2021 20:06   US Renal  Result Date: 05/07/2021 CLINICAL DATA:  Initial evaluation for acute flank pain. EXAM: RENAL / URINARY TRACT ULTRASOUND COMPLETE COMPARISON:  Prior CT from 04/07/2021 FINDINGS: Right Kidney: Renal measurements: 12.2 x 5.5 x 4.7 cm. Renal echogenicity within normal limits. No nephrolithiasis or hydronephrosis. No focal renal mass. Left Kidney: Renal measurements: 11.5 x 4.9 x 4.6 cm. Renal echogenicity within normal limits. 8 mm nonobstructive calculus present within the interpolar region. No hydronephrosis. No focal renal mass. Bladder: Appears normal for degree of bladder distention. Ureteral jets not demonstrated on this  exam. Other: None. IMPRESSION: 1. 8 mm  nonobstructive left renal calculus. 2. Otherwise unremarkable and normal renal ultrasound. No hydronephrosis. Electronically Signed   By: Jeannine Boga M.D.   On: 05/07/2021 20:21    Procedures Procedures   Medications Ordered in ED Medications - No data to display  ED Course  I have reviewed the triage vital signs and the nursing notes.  Pertinent labs & imaging results that were available during my care of the patient were reviewed by me and considered in my medical decision making (see chart for details).    MDM Rules/Calculators/A&P                         patient here for evaluation of two months of left flank pain. He is non-toxic appearing on evaluation with no respiratory distress. EKG is unchanged when compared to priors. Troponin is negative times one. Given ongoing symptoms feel Delta troponin is not necessary at this time. He was recently evaluated for similar symptoms and had a negative CTA PE study. He is currently anticoagulated. Presentation is not consistent with PE or renal thrombosis. UA is not consistent with UTI. Labs are at his baseline. Discussed with patient home care for flank pain, likely musculoskeletal. Discussed PCP follow-up and return precautions.   Final Clinical Impression(s) / ED Diagnoses Final diagnoses:  Left flank pain    Rx / DC Orders ED Discharge Orders    None       Quintella Reichert, MD 05/07/21 2135

## 2021-05-07 NOTE — Discharge Instructions (Addendum)
You have an 61mm stone in your left kidney - it does not appear to be causing your symptoms today.  Please follow up with your family doctor for further evaluation.  You may use voltaren gel or lidoderm gel, available over the counter according to label instructions as needed for pain.

## 2021-05-07 NOTE — ED Notes (Signed)
Has not taken BP meds today

## 2021-05-07 NOTE — ED Notes (Signed)
IV attempt x 2 unsuccessful.

## 2021-05-07 NOTE — ED Triage Notes (Signed)
Pt c/o CP x 2 days-states he came to ED for left flank pain x 5 days-states he has been seen x 2 for flank pain-NAD-steady gait

## 2021-05-10 ENCOUNTER — Telehealth: Payer: Self-pay

## 2021-05-10 NOTE — Telephone Encounter (Signed)
Pt states he remembered calling to cancel the 05/03/2021 OV--- he scheduled 04/30/21, went to ED 05/01/2021...Marland Kitchen pt wants to make sure he does not get a no show fee... please advise

## 2021-05-18 ENCOUNTER — Other Ambulatory Visit: Payer: Self-pay | Admitting: Family Medicine

## 2021-05-18 DIAGNOSIS — I2782 Chronic pulmonary embolism: Secondary | ICD-10-CM

## 2021-05-18 DIAGNOSIS — I4891 Unspecified atrial fibrillation: Secondary | ICD-10-CM

## 2021-07-21 ENCOUNTER — Other Ambulatory Visit: Payer: Self-pay | Admitting: Family Medicine

## 2021-07-21 DIAGNOSIS — E0590 Thyrotoxicosis, unspecified without thyrotoxic crisis or storm: Secondary | ICD-10-CM

## 2021-09-07 ENCOUNTER — Other Ambulatory Visit: Payer: Self-pay | Admitting: Family Medicine

## 2021-09-07 DIAGNOSIS — I10 Essential (primary) hypertension: Secondary | ICD-10-CM

## 2021-09-12 ENCOUNTER — Other Ambulatory Visit: Payer: Self-pay | Admitting: Family Medicine

## 2021-09-12 DIAGNOSIS — I4891 Unspecified atrial fibrillation: Secondary | ICD-10-CM

## 2021-09-12 DIAGNOSIS — I2782 Chronic pulmonary embolism: Secondary | ICD-10-CM

## 2021-09-16 ENCOUNTER — Ambulatory Visit: Payer: BC Managed Care – PPO | Admitting: Primary Care

## 2021-09-16 NOTE — Telephone Encounter (Signed)
Please have pt schedule f/u visit

## 2021-09-16 NOTE — Telephone Encounter (Signed)
Unable to [facility] to schedule appt

## 2021-09-17 NOTE — Telephone Encounter (Signed)
Unable to leave message

## 2021-09-26 ENCOUNTER — Ambulatory Visit: Payer: BC Managed Care – PPO | Admitting: Adult Health

## 2021-10-08 ENCOUNTER — Other Ambulatory Visit: Payer: Self-pay

## 2021-10-08 ENCOUNTER — Encounter (HOSPITAL_COMMUNITY): Payer: Self-pay | Admitting: Emergency Medicine

## 2021-10-08 ENCOUNTER — Ambulatory Visit (HOSPITAL_COMMUNITY)
Admission: EM | Admit: 2021-10-08 | Discharge: 2021-10-08 | Disposition: A | Discharge status: Home / Self Care | Payer: BC Managed Care – PPO | Attending: Urgent Care | Admitting: Urgent Care

## 2021-10-08 DIAGNOSIS — Z8249 Family history of ischemic heart disease and other diseases of the circulatory system: Secondary | ICD-10-CM | POA: Insufficient documentation

## 2021-10-08 DIAGNOSIS — R059 Cough, unspecified: Secondary | ICD-10-CM | POA: Insufficient documentation

## 2021-10-08 DIAGNOSIS — E119 Type 2 diabetes mellitus without complications: Secondary | ICD-10-CM | POA: Insufficient documentation

## 2021-10-08 DIAGNOSIS — J069 Acute upper respiratory infection, unspecified: Secondary | ICD-10-CM | POA: Diagnosis not present

## 2021-10-08 DIAGNOSIS — R030 Elevated blood-pressure reading, without diagnosis of hypertension: Secondary | ICD-10-CM

## 2021-10-08 DIAGNOSIS — I10 Essential (primary) hypertension: Secondary | ICD-10-CM | POA: Diagnosis not present

## 2021-10-08 DIAGNOSIS — Z20822 Contact with and (suspected) exposure to covid-19: Secondary | ICD-10-CM | POA: Diagnosis not present

## 2021-10-08 DIAGNOSIS — Z7901 Long term (current) use of anticoagulants: Secondary | ICD-10-CM | POA: Insufficient documentation

## 2021-10-08 DIAGNOSIS — Z7984 Long term (current) use of oral hypoglycemic drugs: Secondary | ICD-10-CM | POA: Insufficient documentation

## 2021-10-08 DIAGNOSIS — J3089 Other allergic rhinitis: Secondary | ICD-10-CM | POA: Diagnosis not present

## 2021-10-08 DIAGNOSIS — F17290 Nicotine dependence, other tobacco product, uncomplicated: Secondary | ICD-10-CM | POA: Diagnosis not present

## 2021-10-08 DIAGNOSIS — Z86711 Personal history of pulmonary embolism: Secondary | ICD-10-CM | POA: Insufficient documentation

## 2021-10-08 DIAGNOSIS — Z79899 Other long term (current) drug therapy: Secondary | ICD-10-CM | POA: Diagnosis not present

## 2021-10-08 LAB — SARS CORONAVIRUS 2 (TAT 6-24 HRS): SARS Coronavirus 2: NEGATIVE

## 2021-10-08 MED ORDER — BENZONATATE 100 MG PO CAPS: 100.0000 mg | ORAL_CAPSULE | Freq: Three times a day (TID) | ORAL | 0 refills | Status: DC | PRN

## 2021-10-08 MED ORDER — PROMETHAZINE-DM 6.25-15 MG/5ML PO SYRP: 5.0000 mL | ORAL_SOLUTION | Freq: Every evening | ORAL | 0 refills | Status: DC | PRN

## 2021-10-08 NOTE — ED Triage Notes (Signed)
Pt is present today with nasal drainage, sore throat, itchy eyes, and a cough. Pt states sx started last Thursday

## 2021-10-08 NOTE — ED Provider Notes (Signed)
[place]   MRN: [id number] DOB: 09/20/1966  Subjective:   Spencer English is a 55 y.o. male presenting for 5-day history of acute onset recurrent bilateral eye watering, eye itching, sinus congestion, sinus pressure, postnasal drainage.  He is also had a dry cough.  Has a history of allergic rhinitis, takes Flonase and Allegra daily.  States that he gets an antibiotic 1-2 times yearly.  Usually gets azithromycin to clear his sinuses.  He did not go to his regular doctor that provides him with his yearly antibiotic because they did not have availability for 2 weeks.  No chest pain, shortness of breath.  No wheezing.  Regarding his blood pressure, states that he forgot to take his medication over the weekend and has been inconsistent with this.  No headache, confusion, weakness, numbness or tingling.  He does have a history of a chest PE and is on anticoagulation for this.  He also has diabetes treated without insulin, is due for an A1c check.  Last one was less than 8% about a year ago.  No current facility-administered medications for this encounter.  Current Outpatient Medications:    acetaminophen (TYLENOL) 500 MG tablet, Take 2 tablets (1,000 mg total) by mouth every 6 (six) hours as needed., Disp: 30 tablet, Rfl: 0   allopurinol (ZYLOPRIM) 300 MG tablet, Take 300 mg by mouth as needed (gout flare up)., Disp: , Rfl:    amoxicillin-clavulanate (AUGMENTIN) 875-125 MG tablet, Take 1 tablet by mouth 2 (two) times daily., Disp: 13 tablet, Rfl: 0   atorvastatin (LIPITOR) 40 MG tablet, Take 1 tablet (40 mg total) by mouth daily., Disp: 90 tablet, Rfl: 3   dapagliflozin propanediol (FARXIGA) 5 MG TABS tablet, Take 1 tablet (5 mg total) by mouth daily., Disp: 30 tablet, Rfl: 11   fluticasone (FLONASE) 50 MCG/ACT nasal spray, Place 1 spray into both nostrils daily. allergies, Disp: , Rfl:    furosemide (LASIX) 40 MG tablet, TAKE 1 TABLET BY MOUTH EVERY DAY, Disp: 90 tablet, Rfl: 1    glucose blood (CONTOUR NEXT TEST) test strip, 1 each by Other route daily. And lancets 1/day, Disp: 100 each, Rfl: 3   halobetasol (ULTRAVATE) 0.05 % cream, APPLY CREAM TOPICALLY THREE TIMES DAILY AS NEEDED FOR RASH, Disp: 15 g, Rfl: 0   losartan (COZAAR) 100 MG tablet, Take 1 tablet (100 mg total) by mouth daily., Disp: 90 tablet, Rfl: 3   methimazole (TAPAZOLE) 10 MG tablet, TAKE 1 TABLET(10 MG) BY MOUTH DAILY, Disp: 30 tablet, Rfl: 0   metoprolol succinate (TOPROL-XL) 50 MG 24 hr tablet, Take 1 tablet (50 mg total) by mouth daily., Disp: 90 tablet, Rfl: 1   predniSONE (DELTASONE) 20 MG tablet, Take 2 tablets (40 mg total) by mouth daily., Disp: 6 tablet, Rfl: 0   rivaroxaban (XARELTO) 20 MG TABS tablet, TAKE 1 TABLET BY MOUTH EVERY DAY WITH SUPPER, Disp: 60 tablet, Rfl: 0   sitaGLIPtin (JANUVIA) 100 MG tablet, Take 1 tablet (100 mg total) by mouth daily., Disp: 90 tablet, Rfl: 3   tiZANidine (ZANAFLEX) 4 MG tablet, Take 1 tablet (4 mg total) by mouth every 8 (eight) hours as needed., Disp: 30 tablet, Rfl: 0   traMADol (ULTRAM) 50 MG tablet, 1-2 tablets every 6 hours as needed for pain, Disp: 20 tablet, Rfl: 0   verapamil (CALAN-SR) 240 MG CR tablet, Take 1 tablet (240 mg total) by mouth at bedtime., Disp: 90 tablet, Rfl: 3   Allergies  Allergen  Reactions   Metformin Diarrhea and Nausea Only   Viagra [Sildenafil Citrate] Other (See Comments)    headache    Past Medical History:  Diagnosis Date   ALLERGIC RHINITIS 04/12/2009   Allergy    Anxiety    Arthritis    Atrial fibrillation ([place])    BACK PAIN, LUMBAR 01/14/2011   DEEP VENOUS THROMBOPHLEBITIS, LEG, LEFT 05/05/2009   DM 08/08/2008   DYSLIPIDEMIA 04/26/2009   ECZEMA 05/23/2010   ERECTILE DYSFUNCTION, 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   GERD (gastroesophageal reflux disease)    GOUT 10/24/2010   Hyperglycemia    HYPERTENSION 07/21/2007   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   Leukopenia    Long term (current) use of anticoagulants 04/03/2011   PULMONARY EMBOLISM  05/03/2009   Sleep apnea    CPAP at bedtime   UNSPECIFIED URINARY CALCULUS      Past Surgical History:  Procedure Laterality Date   ANTERIOR CRUCIATE LIGAMENT REPAIR  2000   Right   CYSTECTOMY     back of head   KNEE ARTHROSCOPY     left knee   KNEE ARTHROSCOPY  01/03/2013   Procedure: ARTHROSCOPY KNEE;  Surgeon: Yvette Rack., MD;  Location: [place];  Service: Orthopedics;  Laterality: Left;  Lavage Synovectomy, Removal of loose body   KNEE ARTHROSCOPY W/ ACL RECONSTRUCTION     right knee    Family History  Problem Relation Age of Onset   Diabetes Mother    Hypertension Mother    Hyperlipidemia Mother    Dementia Mother    Diabetes Father    Hypertension Father    Hyperlipidemia Father    Heart attack Father    Heart disease Father    Prostate cancer Father 49   Stomach cancer Neg Hx    Colon cancer Neg Hx    Pancreatic cancer Neg Hx    Esophageal cancer Neg Hx    Rectal cancer Neg Hx     Social History   Tobacco Use   Smoking status: Some Days    Types: Cigars   Smokeless tobacco: Never  Vaping Use   Vaping Use: Never used  Substance Use Topics   Alcohol use: Yes    Comment: weekends   Drug use: No    ROS   Objective:   Vitals: BP (!) 186/102   Pulse 79   Temp 98.3 F (36.8 C)   Resp 18   SpO2 97%   BP Readings from Last 3 Encounters:  10/08/21 (!) 186/102  05/07/21 (!) 176/107  05/01/21 133/89   Physical Exam Constitutional:      General: He is not in acute distress.    Appearance: Normal appearance. He is well-developed and normal weight. He is not ill-appearing, toxic-appearing or diaphoretic.  HENT:     Head: Normocephalic and atraumatic.     Right Ear: Tympanic membrane, ear canal and external ear normal. There is no impacted cerumen.     Left Ear: Tympanic membrane, ear canal and external ear normal. There is no impacted cerumen.     Nose: Congestion and rhinorrhea present.     Comments: Nasal mucosa boggy and edematous.     Mouth/Throat:     Mouth: Mucous membranes are moist.     Pharynx: No oropharyngeal exudate or posterior oropharyngeal erythema.     Comments: Significant postnasal drainage overlying pharynx. Eyes:     General: No scleral icterus.       Right eye: No discharge.  Left eye: No discharge.     Extraocular Movements: Extraocular movements intact.     Pupils: Pupils are equal, round, and reactive to light.     Comments: Conjunctive a injected bilaterally.  Cardiovascular:     Rate and Rhythm: Normal rate and regular rhythm.     Heart sounds: Normal heart sounds. No murmur heard.   No friction rub. No gallop.  Pulmonary:     Effort: Pulmonary effort is normal. No respiratory distress.     Breath sounds: Normal breath sounds. No stridor. No wheezing, rhonchi or rales.  Musculoskeletal:     Cervical back: Normal range of motion and neck supple. No rigidity. No muscular tenderness.  Neurological:     General: No focal deficit present.     Mental Status: He is alert and oriented to person, place, and time.     Cranial Nerves: No cranial nerve deficit.     Motor: No weakness.     Coordination: Coordination normal.     Gait: Gait normal.     Deep Tendon Reflexes: Reflexes normal.     Comments: Negative Romberg and pronator drift.  Psychiatric:        Mood and Affect: Mood normal.        Behavior: Behavior normal.        Thought Content: Thought content normal.        Judgment: Judgment normal.     Assessment and Plan :   PDMP not reviewed this encounter.  1. Viral URI with cough   2. Essential hypertension   3. Elevated blood pressure reading   4. Allergic rhinitis due to other allergic trigger, unspecified seasonality     Discussed antibiotic stewardship at length.  I refused to provide patient with his yearly antibiotic.  Offered him a prednisone course for what I suspect is allergic rhinitis.  Patient refuses.  Recommended supportive care.  Would like a COVID test.   Follow-up with PCP. Counseled patient on potential for adverse effects with medications prescribed/recommended today, ER and return-to-clinic precautions discussed, patient verbalized understanding.    Jaynee Eagles, [place] 10/08/21 [id number]

## 2021-10-08 NOTE — Discharge Instructions (Addendum)
Please follow-up with your regular doctor to get your yearly antibiotic.  Today, I am offering you supportive care including cough medications for what I suspect is a viral respiratory illness.  I sent the prescription for this to your pharmacy electronically.  You declined a steroid course which I believe would help you for your allergic rhinitis, yearly sinus flares.  We will let you know what your COVID test results are only if they are positive.

## 2021-10-09 ENCOUNTER — Telehealth: Payer: Self-pay | Admitting: *Deleted

## 2021-10-09 NOTE — Telephone Encounter (Signed)
Called and spoke with patient regarding bringing his SD card to his appointment on 10/10/21, he stated he had reschedule the appointment for 2 weeks d/t the cost of the copay and other medical bills at this time.  He asked that I cancel his appointment for 10/10/21 and he will call in the morning to reschedule for 2 weeks.  Appointment cancelled for 10/10/21.

## 2021-10-10 ENCOUNTER — Ambulatory Visit: Payer: BC Managed Care – PPO | Admitting: Adult Health

## 2021-11-08 ENCOUNTER — Other Ambulatory Visit: Payer: Self-pay | Admitting: Family Medicine

## 2021-11-08 DIAGNOSIS — I10 Essential (primary) hypertension: Secondary | ICD-10-CM

## 2021-11-08 DIAGNOSIS — I4891 Unspecified atrial fibrillation: Secondary | ICD-10-CM

## 2021-11-08 DIAGNOSIS — E0590 Thyrotoxicosis, unspecified without thyrotoxic crisis or storm: Secondary | ICD-10-CM

## 2021-11-11 NOTE — Progress Notes (Deleted)
Cardiology Office Note Date:  11/11/2021  Patient ID:  Spencer English, Spencer English September 01, 1966, MRN [id number] PCP:  Lesleigh Noe, MD  Electrophysiologist: Dr. Rayann Heman   Chief Complaint:  *** annual visit  History of Present Illness: Spencer English is a 55 y.o. male with history of PAFib/flutter in setting of hypothyroidism, felt to be resolved with treatment of his thyroid at his last visit with Dr. Rayann Heman in June 2015, HTN, HLD, hx of DVT/PE in 2010, DM, OSA w/ CPAP  Spencer English saw Dr. Rayann Heman Jun 2015, at that time with resolution of his AF symptoms with treatment of his thyroid, and c/o ED thought secondary to the BB, this was started to be down-titrated and to f/u   I saw Jun 2017, a year later, Spencer English was feeling well, but mentioned in the last couple months that Spencer English had been getting an aching type chest discomfort, mostly at night somewhat random, non-radiating, lasting about a minute with no associated symptoms.  Thought Spencer English may have had it a couple times during the day, but more noted evenings, in bed.  No SOB, no symptoms of PND or orthopnea.  Spencer English denied any palpitations, no dizziness, near syncope or syncope.  Spencer English denied any bleeding or signs of bleeding.  Spencer English inquired about holding his coumadin for a tattoo and is recommended not to pursue it.  His BP was quite elevated at that visit, the patient reported very unusually so and had reported significant deitary sodium load that morning.recently.  Spencer English was counseled, planned for a stress test and an echo to f/u on his MR. His echo noted LVEF 55-60%, trivial MR, no WMA, stress test was negative.  His last visit with [place] was with L. Servando Snare Oct 2019.  She noted poor compliance with coumadin clnic, and often calling after hours for med refills.  Mentioned hard to get time off work for appts. Spencer English was there with c/o not feeling well, having trouble breathing through his nose, episodes of SOB, anxiety provoking. Planned for chest CTA, updating his echo and changing to  Xarelto. (CT was neg for PE, LVEF was preserved, grade I DD, no significant VHD)  There was some phone notes regarding HTN ooc, after this, no further contact with our office since 2019.  07/01/2020: ER visit needing refill on his verapamil and c/o penile itching. 177/114, reportedly eating out a lot, dietary noncompliance  I saw him 08/09/20 Spencer English feels well.  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 Spencer English denies any symptoms, no CP, palpitations or cardiac awareness.  No SOB , DOE or difficulties with his ADLs, denies exertional intolerances.  Has some chronic knee pain that eventually is his limiter. No dizzy spells, near syncope or syncope. Spencer English denies bleeding or signs of bleeding. Spencer English sees his PMD Spencer English says regularaly an has labs there. His Home BP runs 140/80 or so, sometimes higher, reports medicine compliance No changes were made, planned for annual visit   Spencer English has had several ER/UCC visits this year, most 2/2 back pain, L flank pain, Oct for URI  *** symptoms *** BP *** bleeding, xarelto, dose *** driving truck? *** OSA compliance *** afib?  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   GERD (gastroesophageal reflux disease)    GOUT 10/24/2010   Hyperglycemia    HYPERTENSION 07/21/2007   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   Leukopenia    Long term (current) use of anticoagulants 04/03/2011   PULMONARY EMBOLISM 05/03/2009   Sleep apnea    CPAP at bedtime   UNSPECIFIED URINARY CALCULUS     Past Surgical History:  Procedure Laterality Date   ANTERIOR CRUCIATE LIGAMENT REPAIR  2000   Right   CYSTECTOMY     back of head    KNEE ARTHROSCOPY     left knee   KNEE ARTHROSCOPY  01/03/2013   Procedure: ARTHROSCOPY KNEE;  Surgeon: Yvette Rack., MD;  Location: [facility];  Service: Orthopedics;  Laterality: Left;  Lavage Synovectomy, Removal of loose body   KNEE ARTHROSCOPY W/ ACL RECONSTRUCTION     right knee    Current Outpatient Medications  Medication Sig Dispense Refill   acetaminophen (TYLENOL) 500 MG tablet Take 2 tablets (1,000 mg total) by mouth every 6 (six) hours as needed. 30 tablet 0   allopurinol (ZYLOPRIM) 300 MG tablet Take 300 mg by mouth as needed (gout flare up).     amoxicillin-clavulanate (AUGMENTIN) 875-125 MG tablet Take 1 tablet by mouth 2 (two) times daily. 13 tablet 0   atorvastatin (LIPITOR) 40 MG tablet Take 1 tablet (40 mg total) by mouth daily. 90 tablet 3   benzonatate (TESSALON) 100 MG capsule Take 1-2 capsules (100-200 mg total) by mouth 3 (three) times daily as needed. 60 capsule 0   dapagliflozin propanediol (FARXIGA) 5 MG TABS tablet Take 1 tablet (5 mg total) by mouth daily. 30 tablet 11   fluticasone (FLONASE) 50 MCG/ACT nasal spray Place 1 spray into both nostrils daily. allergies     furosemide (LASIX) 40 MG tablet TAKE 1 TABLET BY MOUTH EVERY DAY 90 tablet 1   glucose blood (CONTOUR NEXT TEST) test strip 1 each by Other route daily. And lancets 1/day 100 each 3   halobetasol (ULTRAVATE) 0.05 % cream APPLY CREAM TOPICALLY THREE TIMES DAILY AS NEEDED FOR RASH 15 g 0   losartan (COZAAR) 100 MG tablet Take 1 tablet (100 mg total) by mouth daily. 90 tablet 3   methimazole (TAPAZOLE) 10 MG tablet TAKE 1 TABLET(10 MG) BY MOUTH DAILY 30 tablet 0   metoprolol succinate (TOPROL-XL) 50 MG 24 hr tablet Take 1 tablet (50 mg total) by mouth daily. 90 tablet 1   predniSONE (DELTASONE) 20 MG tablet Take 2 tablets (40 mg total) by mouth daily. 6 tablet 0   promethazine-dextromethorphan (PROMETHAZINE-DM) 6.25-15 MG/5ML syrup Take 5 mLs by mouth at bedtime as needed for cough. 100 mL 0   rivaroxaban  (XARELTO) 20 MG TABS tablet TAKE 1 TABLET BY MOUTH EVERY DAY WITH SUPPER 60 tablet 0   sitaGLIPtin (JANUVIA) 100 MG tablet Take 1 tablet (100 mg total) by mouth daily. 90 tablet 3   tiZANidine (ZANAFLEX) 4 MG tablet Take 1 tablet (4 mg total) by mouth every 8 (eight) hours as needed. 30 tablet 0   traMADol (ULTRAM) 50 MG tablet 1-2 tablets every 6 hours as needed for pain 20 tablet 0   verapamil (CALAN-SR) 240 MG CR tablet Take 1 tablet (240 mg total) by mouth at bedtime. 90 tablet 3   No current facility-administered medications for this visit.    Allergies:  Metformin and Viagra [sildenafil citrate]   Social History:  The patient  reports that Spencer English has been smoking cigars. Spencer English has never used smokeless tobacco. Spencer English reports current alcohol use. Spencer English reports that Spencer English does not use drugs.   Family History:  The patient's family history includes Dementia in his mother; Diabetes in his father and mother; Heart attack in his father; Heart disease in his father; Hyperlipidemia in his father and mother; Hypertension in his father and mother; Prostate cancer (age of onset: 61) in his father.  ROS:  Please see the history of present illness.   All other systems are reviewed and otherwise negative.   PHYSICAL EXAM:  VS:  There were no vitals taken for this visit. BMI: There is no height or weight on file to calculate BMI. Well nourished, well developed, obese, in no acute distress  HEENT: normocephalic, atraumatic  Neck: no JVD, carotid bruits or masses Cardiac:  *** RRR; no significant murmurs, no rubs, or gallops Lungs:  *** CTA b/l, no wheezing, rhonchi or rales  Abd: soft, nontender, obese MS: no deformity or atrophy Ext:  *** Trace -1+ edema b/l, no skin changes  Skin: warm and dry, no rash Neuro:  No gross deficits appreciated Psych: euthymic mood, full affect   EKG:  done today and reviewed by myself ***   10/14/2018: TTE Study Conclusions  - Left ventricle: The cavity size was normal.  Systolic function was    normal. The estimated ejection fraction was in the range of 55%    to 60%. Wall motion was normal; there were no regional wall    motion abnormalities. Doppler parameters are consistent with    abnormal left ventricular relaxation (grade 1 diastolic    dysfunction). Doppler parameters are consistent with elevated    mean left atrial filling pressure.  - Aortic valve: There was trivial regurgitation.  - Left atrium: The atrium was mildly dilated.    07/08/16: TTE Study Conclusions - Left ventricle: The cavity size was normal. Systolic function was   normal. The estimated ejection fraction was in the range of 55%   to 60%. Wall motion was normal; there were no regional wall   motion abnormalities. There was an increased relative   contribution of atrial contraction to ventricular filling.   Doppler parameters are consistent with abnormal left ventricular   relaxation (grade 1 diastolic dysfunction). - Aortic valve: Trileaflet; normal thickness, mildly calcified   leaflets. - Aorta: Aortic root dimension: 42 mm (ED). - Aortic root: The aortic root was mildly dilated. - Mitral valve: There was trivial regurgitation. - Atrial septum: A patent foramen ovale cannot be excluded. - Tricuspid valve: There was mild regurgitation. - Recommendations: Agitated saline contrast study to rule out PFO. Recommendations:  Agitated saline contrast study to rule out PFO.  07/09/16: stress myoview Nuclear stress EF: 51%. There was no ST segment deviation noted during stress. The study is normal. This is a low risk study. The left ventricular ejection fraction is mildly decreased (45-54%). Normal exercise nuclear stress test with no evidence of prior infarct or ischemia.  LVEF calculated at 51% but visually appears better.   01/24/13: Echocardiogram Study Conclusions - Left ventricle: The cavity size was moderately dilated.   Wall thickness was normal. Systolic function was  normal.   The estimated ejection fraction was in the range of 50% to   55%. - Mitral valve: Moderate regurgitation. - Left atrium: The atrium was moderately dilated. - Right ventricle: The cavity size  was moderately dilated.   Systolic function was mildly reduced. - Right atrium: The atrium was moderately dilated. - Tricuspid valve: Moderate regurgitation. - Pulmonary arteries: Systolic pressure was mildly to   moderately increased. PA peak pressure: 14mm Hg (S).   Recent Labs: 05/07/2021: ALT 19; BUN 11; Creatinine, Ser 1.30; Hemoglobin 13.9; Platelets 206; Potassium 3.7; Sodium 135  12/18/2020: Cholesterol 205; HDL 49.00; LDL Cholesterol 143; Total CHOL/HDL Ratio 4; Triglycerides 63.0; VLDL 12.6   CrCl cannot be calculated (Patient's most recent lab result is older than the maximum 21 days allowed.).   Wt Readings from Last 3 Encounters:  05/07/21 260 lb (117.9 kg)  05/01/21 270 lb 3.2 oz (122.6 kg)  04/22/21 260 lb (117.9 kg)     Other studies reviewed: Additional studies/records reviewed today include: summarized above  ASSESSMENT AND PLAN:  1. PAFib, in the setting of hyperthyroism     No palpitations     CHA2DS2Vasc is at least *** 2, on  Xarelto for h/o DVT as well, appropriately dosed     *** No symptoms to suggest recurrent AF   2. 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 OSA/CPAP     Dr. Elsworth Soho, pulmonary     *** Reports compliance with his CPAP and closely monitored by DOT  4. HTN     ***  4. HLD     *** Spencer English reports labs are monitored and managed with his PMD  5. VHD, mod MR      *** No significant VHD by last echo, no MR    Disposition: ***.  Current medicines are reviewed at length with the patient today.  The patient did not have any concerns regarding medicines.  Haywood Lasso, PA-C 11/11/2021 12:58 PM     [address] [place] [address] [phone] (office)  [phone] (fax)

## 2021-11-14 ENCOUNTER — Ambulatory Visit: Payer: BC Managed Care – PPO | Admitting: Physician Assistant

## 2021-11-14 ENCOUNTER — Ambulatory Visit: Payer: BC Managed Care – PPO | Admitting: Family Medicine

## 2021-12-11 ENCOUNTER — Other Ambulatory Visit: Payer: Self-pay

## 2021-12-11 ENCOUNTER — Emergency Department (HOSPITAL_BASED_OUTPATIENT_CLINIC_OR_DEPARTMENT_OTHER): Payer: BC Managed Care – PPO | Admitting: Radiology

## 2021-12-11 ENCOUNTER — Encounter (HOSPITAL_BASED_OUTPATIENT_CLINIC_OR_DEPARTMENT_OTHER): Payer: Self-pay | Admitting: Emergency Medicine

## 2021-12-11 ENCOUNTER — Emergency Department (HOSPITAL_BASED_OUTPATIENT_CLINIC_OR_DEPARTMENT_OTHER)
Admission: EM | Admit: 2021-12-11 | Discharge: 2021-12-11 | Disposition: A | Discharge status: Home / Self Care | Payer: BC Managed Care – PPO | Attending: Emergency Medicine | Admitting: Emergency Medicine

## 2021-12-11 DIAGNOSIS — Z96653 Presence of artificial knee joint, bilateral: Secondary | ICD-10-CM | POA: Insufficient documentation

## 2021-12-11 DIAGNOSIS — I10 Essential (primary) hypertension: Secondary | ICD-10-CM | POA: Diagnosis not present

## 2021-12-11 DIAGNOSIS — F17210 Nicotine dependence, cigarettes, uncomplicated: Secondary | ICD-10-CM | POA: Insufficient documentation

## 2021-12-11 DIAGNOSIS — M25552 Pain in left hip: Secondary | ICD-10-CM | POA: Diagnosis not present

## 2021-12-11 DIAGNOSIS — E119 Type 2 diabetes mellitus without complications: Secondary | ICD-10-CM | POA: Insufficient documentation

## 2021-12-11 DIAGNOSIS — Z79899 Other long term (current) drug therapy: Secondary | ICD-10-CM | POA: Diagnosis not present

## 2021-12-11 DIAGNOSIS — I4891 Unspecified atrial fibrillation: Secondary | ICD-10-CM | POA: Diagnosis not present

## 2021-12-11 DIAGNOSIS — M1612 Unilateral primary osteoarthritis, left hip: Secondary | ICD-10-CM | POA: Diagnosis not present

## 2021-12-11 DIAGNOSIS — R52 Pain, unspecified: Secondary | ICD-10-CM

## 2021-12-11 MED ORDER — LIDOCAINE 5 % EX PTCH
1.0000 | MEDICATED_PATCH | CUTANEOUS | Status: DC
  Administered 2021-12-11: 13:00:00 1 via TRANSDERMAL
  Filled 2021-12-11: qty 1

## 2021-12-11 MED ORDER — MELOXICAM 7.5 MG PO TABS: 7.5000 mg | ORAL_TABLET | Freq: Every day | ORAL | 0 refills | Status: DC

## 2021-12-11 MED ORDER — LIDOCAINE 5 % EX PTCH: 1.0000 | MEDICATED_PATCH | CUTANEOUS | 0 refills | Status: DC

## 2021-12-11 MED ORDER — KETOROLAC TROMETHAMINE 30 MG/ML IJ SOLN
30.0000 mg | Freq: Once | INTRAMUSCULAR | Status: AC
Start: 2021-12-11 — End: 2021-12-11
  Administered 2021-12-11: 13:00:00 30 mg via INTRAMUSCULAR
  Filled 2021-12-11: qty 1

## 2021-12-11 NOTE — Discharge Instructions (Addendum)
Take Mobic twice daily for the next week.  Take it with food and water as this reduces the chance of developing irritation to the stomach lining. Use the Lidoderm patches if they help provide relief. Follow-up with your primary care doctor in the next week or so if you are still having pain.

## 2021-12-11 NOTE — ED Provider Notes (Signed)
[facility] EMERGENCY DEPT Provider Note   CSN: [id number] Arrival date & time: 12/11/21  1048     History Chief Complaint  Patient presents with   Hip Pain    Spencer English is a 55 y.o. male.  HPI  Patient with history of gout, diabetes, lumbar back pain presents due to left hip pain.  Started 4 to 5 days ago, the pain has been constant.  It is worsened by ambulating.  Denies any injury or fall or overexertion.  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  He says it feels like previous gout flare, has never had gout flare in the hips before and is no longer on allopurinol.  No prior injuries or surgeries to the hip.  Denies any back pain, urinary retention, saddle anesthesia, bilateral leg weakness or numbness.  He has tried 800 mg of Tylenol which has not alleviated his symptoms.  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   GERD (gastroesophageal reflux disease)    GOUT 10/24/2010   Hyperglycemia    HYPERTENSION 07/21/2007   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   Leukopenia    Long term (current) use of anticoagulants 04/03/2011   PULMONARY EMBOLISM 05/03/2009   Sleep apnea    CPAP at bedtime   UNSPECIFIED URINARY CALCULUS     Patient Active Problem List   Diagnosis Date Noted   Umbilical hernia without obstruction and without gangrene 12/18/2020   Hyperlipidemia associated with type 2 diabetes mellitus ([place]) 11/01/2020   History of pulmonary embolus (PE) 12/17/2018   OSA on CPAP 06/04/2017   Obese 06/04/2017   Moderate mitral regurgitation 06/26/2014   Pulmonary edema 02/01/2013   Hyperthyroidism 01/27/2013   Moderate to severe pulmonary hypertension ([place]) 01/26/2013    Microcytic anemia 01/25/2013   Thrombocytopenia ([place]) 01/25/2013   History of septic arthritis 01/23/2013   Atrial fibrillation ([place])    Encounter for long-term (current) use of other medications 09/06/2012   Pre-employment examination 10/11/2011   Screening examination for infectious disease 06/16/2011   Encounter for long-term (current) use of anticoagulants 04/03/2011   Gout 10/24/2010   UNSPECIFIED URINARY CALCULUS 07/04/2010   ECZEMA 05/23/2010   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   Chronic pulmonary embolism ([place]) 05/03/2009   DYSLIPIDEMIA 04/26/2009   Allergic rhinitis 04/12/2009   Pain in joint, ankle and foot 04/12/2009   Type 2 diabetes mellitus with other specified complication ([place]) 68/11/7516   ERECTILE DYSFUNCTION, 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   SEBACEOUS CYST 01/31/2008   Essential hypertension 07/21/2007    Past Surgical History:  Procedure Laterality Date   ANTERIOR CRUCIATE LIGAMENT REPAIR  2000   Right   CYSTECTOMY     back of head   KNEE ARTHROSCOPY     left knee   KNEE ARTHROSCOPY  01/03/2013   Procedure: ARTHROSCOPY KNEE;  Surgeon: Yvette Rack., MD;  Location: [facility];  Service: Orthopedics;  Laterality: Left;  Lavage Synovectomy, Removal of loose body   KNEE ARTHROSCOPY W/ ACL RECONSTRUCTION     right knee       Family History  Problem Relation Age of Onset   Diabetes Mother    Hypertension Mother  Hyperlipidemia Mother    Dementia Mother    Diabetes Father    Hypertension Father    Hyperlipidemia Father    Heart attack Father    Heart disease Father    Prostate cancer Father 55   Stomach cancer Neg Hx    Colon cancer Neg Hx    Pancreatic cancer Neg Hx    Esophageal cancer Neg Hx    Rectal cancer Neg Hx     Social History   Tobacco Use   Smoking status: Some Days    Types: Cigars   Smokeless tobacco: Never  Vaping Use   Vaping Use: Never used  Substance Use Topics   Alcohol use: Yes    Comment: weekends   Drug use: No    Home  Medications Prior to Admission medications   Medication Sig Start Date End Date Taking? Authorizing Provider  acetaminophen (TYLENOL) 500 MG tablet Take 2 tablets (1,000 mg total) by mouth every 6 (six) hours as needed. 05/01/21   Charlesetta Shanks, MD  allopurinol (ZYLOPRIM) 300 MG tablet Take 300 mg by mouth as needed (gout flare up).    [provider]  amoxicillin-clavulanate (AUGMENTIN) 875-125 MG tablet Take 1 tablet by mouth 2 (two) times daily. 04/22/21   Lajean Saver, MD  atorvastatin (LIPITOR) 40 MG tablet Take 1 tablet (40 mg total) by mouth daily. 12/20/20   Lesleigh Noe, MD  benzonatate (TESSALON) 100 MG capsule Take 1-2 capsules (100-200 mg total) by mouth 3 (three) times daily as needed. 10/08/21   Jaynee Eagles, PA-C  dapagliflozin propanediol (FARXIGA) 5 MG TABS tablet Take 1 tablet (5 mg total) by mouth daily. 11/01/20   Lesleigh Noe, MD  fluticasone (FLONASE) 50 MCG/ACT nasal spray Place 1 spray into both nostrils daily. allergies    [provider]  furosemide (LASIX) 40 MG tablet TAKE 1 TABLET BY MOUTH EVERY DAY 09/11/21   Lesleigh Noe, MD  glucose blood (CONTOUR NEXT TEST) test strip 1 each by Other route daily. And lancets 1/day 09/02/19   Renato Shin, MD  halobetasol (ULTRAVATE) 0.05 % cream APPLY CREAM TOPICALLY THREE TIMES DAILY AS NEEDED FOR RASH 12/02/18   Renato Shin, MD  losartan (COZAAR) 100 MG tablet Take 1 tablet (100 mg total) by mouth daily. 11/01/20   Lesleigh Noe, MD  methimazole (TAPAZOLE) 10 MG tablet TAKE 1 TABLET(10 MG) BY MOUTH DAILY 07/23/21   Lesleigh Noe, MD  metoprolol succinate (TOPROL-XL) 50 MG 24 hr tablet Take 1 tablet (50 mg total) by mouth daily. 12/18/20   Lesleigh Noe, MD  predniSONE (DELTASONE) 20 MG tablet Take 2 tablets (40 mg total) by mouth daily. 04/07/21   Davonna Belling, MD  promethazine-dextromethorphan (PROMETHAZINE-DM) 6.25-15 MG/5ML syrup Take 5 mLs by mouth at bedtime as needed for cough. 10/08/21   Jaynee Eagles, PA-C  rivaroxaban (XARELTO) 20 MG TABS tablet TAKE 1 TABLET BY MOUTH EVERY DAY WITH SUPPER 09/16/21   Lesleigh Noe, MD  sitaGLIPtin (JANUVIA) 100 MG tablet Take 1 tablet (100 mg total) by mouth daily. 12/18/20   Lesleigh Noe, MD  tiZANidine (ZANAFLEX) 4 MG tablet Take 1 tablet (4 mg total) by mouth every 8 (eight) hours as needed. 03/17/21   Jaynee Eagles, PA-C  traMADol Veatrice [place]bon) 50 MG tablet 1-2 tablets every 6 hours as needed for pain 05/01/21   Charlesetta Shanks, MD  verapamil (CALAN-SR) 240 MG CR tablet Take 1 tablet (240 mg total) by mouth at bedtime. 11/01/20  Lesleigh Noe, MD    Allergies    Metformin and Viagra [sildenafil citrate]  Review of Systems   Review of Systems  Constitutional:  Negative for fever.  Musculoskeletal:  Positive for arthralgias, gait problem and myalgias. Negative for joint swelling.   Physical Exam Updated Vital Signs BP (!) 141/81 (BP Location: Left Arm)    Pulse 69    Temp 98.3 F (36.8 C) (Temporal)    Resp 18    Ht 6' (1.829 m)    Wt 117.9 kg    SpO2 99%    BMI 35.26 kg/m   Physical Exam Vitals and nursing note reviewed. Exam conducted with a chaperone present.  Constitutional:      Appearance: Normal appearance.  HENT:     Head: Normocephalic.  Eyes:     Extraocular Movements: Extraocular movements intact.     Pupils: Pupils are equal, round, and reactive to light.     Comments: No nystagmus   Neck:     Comments: No midline cervical tenderness. No palpable deformities.  Cardiovascular:     Rate and Rhythm: Normal rate and regular rhythm.     Pulses: Normal pulses.     Comments: DP, PT, and radial pulses 2+ and symmetrical bilaterally Pulmonary:     Effort: Pulmonary effort is normal.     Breath sounds: Normal breath sounds.  Abdominal:     Tenderness: There is no right CVA tenderness or left CVA tenderness.  Musculoskeletal:        General: Tenderness present.     Cervical back: Normal range of motion. No rigidity or  tenderness.     Comments: Diffuse tenderness to the left hip, nonfocal.  Decreased active range of motion secondary to pain, able to tolerate passive ambulation without any difficulty.  Skin:    General: Skin is warm and dry.     Capillary Refill: Capillary refill takes less than 2 seconds.     Findings: No bruising or erythema.     Comments: No erythema or swelling to the left hip  Neurological:     Mental Status: He is alert and oriented to person, place, and time. Mental status is at baseline.     Comments: Patient is alert, oriented to personal, place and time with normal speech. Cranial nerves III-XII grossly in tact. Grip strength equal bilaterally LE strength equal bilaterally. Sensation to light touch in tact bilaterally. No gait abnormalities, patient ambulatory.    Psychiatric:        Mood and Affect: Mood normal.    ED Results / Procedures / Treatments   Labs (all labs ordered are listed, but only abnormal results are displayed) Labs Reviewed - No data to display  EKG None  Radiology DG HIP UNILAT WITH PELVIS 2-3 VIEWS LEFT  Result Date: 12/11/2021 CLINICAL DATA:  Provided history: Pain. Additional history provided: Patient reports left hip pain. Pain worsening over the past 3 days. Patient reports a history of gout. EXAM: DG HIP (WITH OR WITHOUT PELVIS) 2-3V LEFT COMPARISON:  None FINDINGS: There is normal bony alignment. No evidence of acute osseous or articular abnormality. 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 Lower lumbar spondylosis. Vascular calcifications. IMPRESSION: No evidence of acute osseous or articular abnormality. 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 Electronically Signed   By: Kellie Simmering D.O.   On: 12/11/2021 12:08    Procedures Procedures   Medications Ordered in ED Medications - No data to display  ED Course  I have reviewed the triage vital signs and the nursing notes.  Pertinent  labs & imaging results that were available during my care of the patient were reviewed by me and considered in my medical decision making (see chart for details).    MDM Rules/Calculators/A&P                           Stable vitals, nontoxic-appearing.  No focal deficits on exam or recent trauma to the hip.  Gout is a consideration, seems less likely given no erythema or swelling.  Doubt septic joint, not febrile and able to tolerate passive and active range.  Radiograph ordered in triage, notable for osteoarthritis but no fracture or dislocation.  Patient is ambulatory, good pulses.  Will discharge with course of anti-inflammatory medicine and have him follow-up with his PCP if no improvement in a week.  Do not think additional evaluation needed at this current time.  Final Clinical Impression(s) / ED Diagnoses Final diagnoses:  Pain    Rx / DC Orders ED Discharge Orders     None        Sherrill Raring, PA-C 12/11/21 [place], DO 12/11/21 1606

## 2021-12-11 NOTE — ED Triage Notes (Signed)
Pt reports left sided hip pain, ambulatory with a limp. States hx of gout. Worsening x3 days.

## 2021-12-13 ENCOUNTER — Telehealth: Payer: Self-pay

## 2021-12-13 NOTE — Telephone Encounter (Signed)
Pt called in frustrated about medication refill pt said his hands is beginning to tremble again and pt said it does not make sense that his medicine is not refilled. I let pt know that it is does take 48/72 house to process medication. Pt said that he is at the pharmacy now and nothing has been sent in on our end. Pt stated  we need to get ourselves together.Pt said he was going to put [place] on the news because he is not getting his medicine  Pt said the pharmacy sent [facility] the refill over a week n a half ago and nothing was sent back to them to refill. Pt stated the pharmacy sent the request in again today and no response from the doctor Pt want to called regarding medication

## 2021-12-13 NOTE — Telephone Encounter (Signed)
Caller is out of thyroid Methimazole 10 mg q tabs meds for over a week.  He also needs Metoprolol ER 37.5 mg1.5 tbs q day

## 2021-12-13 NOTE — Telephone Encounter (Signed)
[facility] - Client TELEPHONE ADVICE RECORD [facility] Patient Name: Spencer English ARE Gender: Male DOB: 11/23/1966 Age: 55 Y 62 M 7 D Return Phone Number: [phone] (Primary) Address: City/ State/ Zip: [place]  [address] Client [facility] - Client Client Site [place] Provider Waunita Schooner- MD Contact Type Call Who Is Calling Patient / Member / Family / Caregiver Call Type Triage / Clinical Relationship To Patient Self Return Phone Number [phone] (Primary) Chief Complaint Prescription Refill or Medication Request (non symptomatic) Reason for Call Medication Question / Request Initial Comment Calling regarding medication, thyroid meds Methamaxol Translation No Nurse Assessment Nurse: Lovena Le, RN, Santiago Glad Date/Time Eilene Ghazi Time): 12/12/2021 7:49:38 PM Confirm and document reason for call. If symptomatic, describe symptoms. ---Caller is out of thyroid Methimazole 10 mg q tabs meds for over a week. He also needs Metoprolol ER 37.5 mg1.5 tbs q day. Pharmacy is closed at time of call. Denies any symptoms or need for triage. Advised him to follow up with office in am. Verbalized understanding. Does the patient have any new or worsening symptoms? ---No Disp. Time Eilene Ghazi Time) Disposition Final User 12/12/2021 7:18:33 PM Send To Nurse Graylon Gunning, RN, Rhonda 12/12/2021 7:56:39 PM Clinical Call Yes Lovena Le, RN, Birdie Hopes

## 2021-12-13 NOTE — Telephone Encounter (Signed)
Spoke to pt and explained to him that we would not be able to refill his medication with an appt. Due to him not being seen in office since 12/18/20. Pt has not been taking the 2 medications he requested refills on, consistently, because the Methimazole was filled for #30 on 07/23/21 and the Metoprolol was filled for #90 with 1 on 12/18/20. I explained that with him being noncompliant with the meds, he would definitely need an appt. Pt explains that it is hard for him to get time off work and he has to give a one week notice. I told pt that I would have him talk to front office to get him scheduled for an appt in the near future so he could tell his work, but that I cannot refill his meds at this time. Pt stated understanding and scheduled an appt with Tabitha on 12/27/21 at 2 pm

## 2021-12-19 ENCOUNTER — Other Ambulatory Visit: Payer: Self-pay | Admitting: Family Medicine

## 2021-12-19 DIAGNOSIS — I2782 Chronic pulmonary embolism: Secondary | ICD-10-CM

## 2021-12-19 DIAGNOSIS — I4891 Unspecified atrial fibrillation: Secondary | ICD-10-CM

## 2021-12-24 ENCOUNTER — Encounter (HOSPITAL_BASED_OUTPATIENT_CLINIC_OR_DEPARTMENT_OTHER): Payer: Self-pay

## 2021-12-24 ENCOUNTER — Emergency Department (HOSPITAL_BASED_OUTPATIENT_CLINIC_OR_DEPARTMENT_OTHER): Payer: BC Managed Care – PPO

## 2021-12-24 ENCOUNTER — Other Ambulatory Visit: Payer: Self-pay

## 2021-12-24 ENCOUNTER — Emergency Department (HOSPITAL_BASED_OUTPATIENT_CLINIC_OR_DEPARTMENT_OTHER)
Admission: EM | Admit: 2021-12-24 | Discharge: 2021-12-24 | Disposition: A | Discharge status: Home / Self Care | Payer: BC Managed Care – PPO | Attending: Emergency Medicine | Admitting: Emergency Medicine

## 2021-12-24 DIAGNOSIS — Z79899 Other long term (current) drug therapy: Secondary | ICD-10-CM | POA: Insufficient documentation

## 2021-12-24 DIAGNOSIS — R7989 Other specified abnormal findings of blood chemistry: Secondary | ICD-10-CM | POA: Diagnosis not present

## 2021-12-24 DIAGNOSIS — I509 Heart failure, unspecified: Secondary | ICD-10-CM | POA: Diagnosis not present

## 2021-12-24 DIAGNOSIS — F17290 Nicotine dependence, other tobacco product, uncomplicated: Secondary | ICD-10-CM | POA: Insufficient documentation

## 2021-12-24 DIAGNOSIS — Z7901 Long term (current) use of anticoagulants: Secondary | ICD-10-CM | POA: Diagnosis not present

## 2021-12-24 DIAGNOSIS — R109 Unspecified abdominal pain: Secondary | ICD-10-CM | POA: Diagnosis not present

## 2021-12-24 DIAGNOSIS — R002 Palpitations: Secondary | ICD-10-CM | POA: Diagnosis not present

## 2021-12-24 DIAGNOSIS — I5022 Chronic systolic (congestive) heart failure: Secondary | ICD-10-CM | POA: Diagnosis not present

## 2021-12-24 DIAGNOSIS — E119 Type 2 diabetes mellitus without complications: Secondary | ICD-10-CM | POA: Diagnosis not present

## 2021-12-24 DIAGNOSIS — R000 Tachycardia, unspecified: Secondary | ICD-10-CM | POA: Insufficient documentation

## 2021-12-24 DIAGNOSIS — I110 Hypertensive heart disease with heart failure: Secondary | ICD-10-CM | POA: Insufficient documentation

## 2021-12-24 DIAGNOSIS — I5032 Chronic diastolic (congestive) heart failure: Secondary | ICD-10-CM

## 2021-12-24 DIAGNOSIS — Z760 Encounter for issue of repeat prescription: Secondary | ICD-10-CM

## 2021-12-24 LAB — COMPREHENSIVE METABOLIC PANEL
ALT: 15 U/L (ref 0–44)
AST: 14 U/L — ABNORMAL LOW (ref 15–41)
Albumin: 4 g/dL (ref 3.5–5.0)
Alkaline Phosphatase: 56 U/L (ref 38–126)
Anion gap: 7 (ref 5–15)
BUN: 12 mg/dL (ref 6–20)
CO2: 29 mmol/L (ref 22–32)
Calcium: 9.2 mg/dL (ref 8.9–10.3)
Chloride: 103 mmol/L (ref 98–111)
Creatinine, Ser: 1.38 mg/dL — ABNORMAL HIGH (ref 0.61–1.24)
GFR, Estimated: >60 mL/min (ref >60)
Glucose, Bld: 114 mg/dL — ABNORMAL HIGH (ref 70–99)
Potassium: 4.2 mmol/L (ref 3.5–5.1)
Sodium: 139 mmol/L (ref 135–145)
Total Bilirubin: 1.1 mg/dL (ref 0.3–1.2)
Total Protein: 7.4 g/dL (ref 6.5–8.1)

## 2021-12-24 LAB — CBC
HCT: 42.6 % (ref 39.0–52.0)
Hemoglobin: 13.6 g/dL (ref 13.0–17.0)
MCH: 26.5 pg (ref 26.0–34.0)
MCHC: 31.9 g/dL (ref 30.0–36.0)
MCV: 83 fL (ref 80.0–100.0)
Platelets: 207 10*3/uL (ref 150–400)
RBC: 5.13 MIL/uL (ref 4.22–5.81)
RDW: 15.9 % — ABNORMAL HIGH (ref 11.5–15.5)
WBC: 5.4 10*3/uL (ref 4.0–10.5)
nRBC: 0 % (ref 0.0–0.2)

## 2021-12-24 LAB — URINALYSIS, ROUTINE W REFLEX MICROSCOPIC
Bilirubin Urine: NEGATIVE
Glucose, UA: >1000 mg/dL — AB
Hgb urine dipstick: NEGATIVE
Ketones, ur: NEGATIVE mg/dL
Leukocytes,Ua: NEGATIVE
Nitrite: NEGATIVE
Specific Gravity, Urine: 1.034 — ABNORMAL HIGH (ref 1.005–1.030)
pH: 5.5 (ref 5.0–8.0)

## 2021-12-24 LAB — TROPONIN I (HIGH SENSITIVITY)
Troponin I (High Sensitivity): 6 ng/L (ref <18)
Troponin I (High Sensitivity): 4 ng/L (ref <18)

## 2021-12-24 LAB — LIPASE, BLOOD: Lipase: 46 U/L (ref 11–51)

## 2021-12-24 LAB — D-DIMER, QUANTITATIVE: D-Dimer, Quant: 1.83 ug/mL-FEU — ABNORMAL HIGH (ref 0.00–0.50)

## 2021-12-24 LAB — BRAIN NATRIURETIC PEPTIDE: B Natriuretic Peptide: 217.4 pg/mL — ABNORMAL HIGH (ref 0.0–100.0)

## 2021-12-24 MED ORDER — METHIMAZOLE 10 MG PO TABS: 10.0000 mg | ORAL_TABLET | Freq: Every day | ORAL | 0 refills | Status: DC

## 2021-12-24 MED ORDER — IOHEXOL 350 MG/ML SOLN
80.0000 mL | Freq: Once | INTRAVENOUS | Status: AC | PRN
  Administered 2021-12-24: 21:00:00 80 mL via INTRAVENOUS

## 2021-12-24 MED ORDER — FUROSEMIDE 40 MG PO TABS: 40.0000 mg | ORAL_TABLET | Freq: Every day | ORAL | 0 refills | Status: DC

## 2021-12-24 MED ORDER — METHIMAZOLE 10 MG PO TABS
10.0000 mg | ORAL_TABLET | Freq: Once | ORAL | Status: DC
  Filled 2021-12-24: qty 1

## 2021-12-24 MED ORDER — METOPROLOL TARTRATE 25 MG PO TABS
25.0000 mg | ORAL_TABLET | Freq: Once | ORAL | Status: AC
  Administered 2021-12-24: 22:00:00 25 mg via ORAL
  Filled 2021-12-24: qty 1

## 2021-12-24 NOTE — ED Triage Notes (Addendum)
Pt presents to ED requesting a new prescription for Lasix and Methimazole. Pt has not seen his PCP in over a year, she sent him here for a new prescription. He is set up for an appointment with PCP but feels like he has "fluid backing up" causing him abd pain  Pt requesting a CT scan to r/o a blood clot

## 2021-12-24 NOTE — ED Provider Notes (Signed)
[facility] Provider Note   CSN: [id number] Arrival date & time: 12/24/21  1721     History Chief Complaint  Patient presents with   Abdominal Pain    Spencer English is a 55 y.o. male.  This is a 55 y.o. male with significant medical history as below, including hyperthyroid, DVT who presents to the ED with complaint of palpitations, leg swelling, cp.  Patient reports he ran out of his Lasix and methimazole 2 weeks ago.  He has been compliant with his DOAC and antihypertensives.  He is having increased swelling to his lower extremities, feels like his abdomen is also mildly swollen.  Increased palpitations.  Similar symptoms to when he ran out of his methimazole in the past.  Currently has no chest pain but does experience palpitations and mild chest tightness with ambulation.  No headaches, vision changes, nausea or vomiting.  No change to bladder or bowel function.  Reports that he has an appointment with his PCP on January 9 for medication refills. Follows with endo @[facility]  The history is provided by the patient. No language interpreter was used.  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   GERD (gastroesophageal reflux disease)    GOUT 10/24/2010   Hyperglycemia    HYPERTENSION 07/21/2007   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   Leukopenia    Long term (current) use of anticoagulants 04/03/2011   PULMONARY EMBOLISM 05/03/2009   Sleep apnea    CPAP at bedtime   UNSPECIFIED URINARY CALCULUS     Patient  Active Problem List   Diagnosis Date Noted   Umbilical hernia without obstruction and without gangrene 12/18/2020   Hyperlipidemia associated with type 2 diabetes mellitus ([place]) 11/01/2020   History of pulmonary embolus (PE) 12/17/2018   OSA on CPAP 06/04/2017   Obese 06/04/2017   Moderate mitral regurgitation 06/26/2014   Pulmonary edema 02/01/2013   Hyperthyroidism 01/27/2013   Moderate to severe pulmonary hypertension ([place]) 01/26/2013   Microcytic anemia 01/25/2013   Thrombocytopenia ([place]) 01/25/2013   History of septic arthritis 01/23/2013   Atrial fibrillation ([place])    Encounter for long-term (current) use of other medications 09/06/2012   Pre-employment examination 10/11/2011   Screening examination for infectious disease 06/16/2011   Encounter for long-term (current) use of anticoagulants 04/03/2011   Gout 10/24/2010   UNSPECIFIED URINARY CALCULUS 07/04/2010   ECZEMA 05/23/2010   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   Chronic pulmonary embolism ([place]) 05/03/2009   DYSLIPIDEMIA 04/26/2009   Allergic rhinitis 04/12/2009   Pain in joint, ankle and foot 04/12/2009   Type 2 diabetes mellitus with other specified complication ([place]) 40/98/1191   ERECTILE DYSFUNCTION, 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   SEBACEOUS CYST 01/31/2008   Essential hypertension 07/21/2007    Past Surgical History:  Procedure Laterality Date   ANTERIOR CRUCIATE LIGAMENT REPAIR  2000   Right   CYSTECTOMY     back of head   KNEE ARTHROSCOPY     left knee   KNEE ARTHROSCOPY  01/03/2013  Procedure: ARTHROSCOPY KNEE;  Surgeon: Yvette Rack., MD;  Location: [facility];  Service: Orthopedics;  Laterality: Left;  Lavage Synovectomy, Removal of loose body   KNEE ARTHROSCOPY W/ ACL RECONSTRUCTION     right knee       Family History  Problem Relation Age of Onset   Diabetes Mother    Hypertension Mother    Hyperlipidemia Mother    Dementia Mother    Diabetes Father    Hypertension Father    Hyperlipidemia Father    Heart attack  Father    Heart disease Father    Prostate cancer Father 37   Stomach cancer Neg Hx    Colon cancer Neg Hx    Pancreatic cancer Neg Hx    Esophageal cancer Neg Hx    Rectal cancer Neg Hx     Social History   Tobacco Use   Smoking status: Some Days    Types: Cigars   Smokeless tobacco: Never  Vaping Use   Vaping Use: Never used  Substance Use Topics   Alcohol use: Yes    Comment: weekends   Drug use: No    Home Medications Prior to Admission medications   Medication Sig Start Date End Date Taking? Authorizing Provider  furosemide (LASIX) 40 MG tablet Take 1 tablet (40 mg total) by mouth daily. 12/24/21  Yes Jeanell Sparrow, DO  methimazole (TAPAZOLE) 10 MG tablet Take 1 tablet (10 mg total) by mouth daily. 12/24/21 01/23/22 Yes Jeanell Sparrow, DO  acetaminophen (TYLENOL) 500 MG tablet Take 2 tablets (1,000 mg total) by mouth every 6 (six) hours as needed. 05/01/21   Charlesetta Shanks, MD  allopurinol (ZYLOPRIM) 300 MG tablet Take 300 mg by mouth as needed (gout flare up).    [provider]  amoxicillin-clavulanate (AUGMENTIN) 875-125 MG tablet Take 1 tablet by mouth 2 (two) times daily. 04/22/21   Lajean Saver, MD  atorvastatin (LIPITOR) 40 MG tablet Take 1 tablet (40 mg total) by mouth daily. 12/20/20   Lesleigh Noe, MD  benzonatate (TESSALON) 100 MG capsule Take 1-2 capsules (100-200 mg total) by mouth 3 (three) times daily as needed. 10/08/21   Jaynee Eagles, PA-C  dapagliflozin propanediol (FARXIGA) 5 MG TABS tablet Take 1 tablet (5 mg total) by mouth daily. 11/01/20   Lesleigh Noe, MD  fluticasone (FLONASE) 50 MCG/ACT nasal spray Place 1 spray into both nostrils daily. allergies    [provider]  furosemide (LASIX) 40 MG tablet TAKE 1 TABLET BY MOUTH EVERY DAY 09/11/21   Lesleigh Noe, MD  glucose blood (CONTOUR NEXT TEST) test strip 1 each by Other route daily. And lancets 1/day 09/02/19   Renato Shin, MD  halobetasol (ULTRAVATE) 0.05 % cream APPLY CREAM  TOPICALLY THREE TIMES DAILY AS NEEDED FOR RASH 12/02/18   Renato Shin, MD  lidocaine (LIDODERM) 5 % Place 1 patch onto the skin daily. Remove & Discard patch within 12 hours or as directed by MD 12/11/21   Sherrill Raring, PA-C  losartan (COZAAR) 100 MG tablet Take 1 tablet (100 mg total) by mouth daily. 11/01/20   Lesleigh Noe, MD  meloxicam (MOBIC) 7.5 MG tablet Take 1 tablet (7.5 mg total) by mouth daily. 12/11/21   Sherrill Raring, PA-C  methimazole (TAPAZOLE) 10 MG tablet TAKE 1 TABLET(10 MG) BY MOUTH DAILY 07/23/21   Lesleigh Noe, MD  metoprolol succinate (TOPROL-XL) 50 MG 24 hr tablet Take 1 tablet (50 mg total) by mouth daily. 12/18/20  Lesleigh Noe, MD  predniSONE (DELTASONE) 20 MG tablet Take 2 tablets (40 mg total) by mouth daily. 04/07/21   Davonna Belling, MD  promethazine-dextromethorphan (PROMETHAZINE-DM) 6.25-15 MG/5ML syrup Take 5 mLs by mouth at bedtime as needed for cough. 10/08/21   Jaynee Eagles, PA-C  rivaroxaban (XARELTO) 20 MG TABS tablet TAKE 1 TABLET(20 MG) BY MOUTH DAILY WITH SUPPER 12/24/21 01/23/22  Eugenia Pancoast, FNP  sitaGLIPtin (JANUVIA) 100 MG tablet Take 1 tablet (100 mg total) by mouth daily. 12/18/20   Lesleigh Noe, MD  tiZANidine (ZANAFLEX) 4 MG tablet Take 1 tablet (4 mg total) by mouth every 8 (eight) hours as needed. 03/17/21   Jaynee Eagles, PA-C  traMADol Veatrice Bourbon) 50 MG tablet 1-2 tablets every 6 hours as needed for pain 05/01/21   Charlesetta Shanks, MD  verapamil (CALAN-SR) 240 MG CR tablet Take 1 tablet (240 mg total) by mouth at bedtime. 11/01/20   Lesleigh Noe, MD    Allergies    Metformin and Viagra [sildenafil citrate]  Review of Systems   Review of Systems  Constitutional:  Negative for chills and fever.  HENT:  Negative for facial swelling and trouble swallowing.   Eyes:  Negative for photophobia and visual disturbance.  Respiratory:  Positive for chest tightness and shortness of breath. Negative for cough.   Cardiovascular:  Positive for  palpitations and leg swelling. Negative for chest pain.  Gastrointestinal:  Positive for abdominal distention. Negative for abdominal pain, nausea and vomiting.  Endocrine: Negative for polydipsia and polyuria.  Genitourinary:  Negative for difficulty urinating and hematuria.  Musculoskeletal:  Negative for gait problem and joint swelling.  Skin:  Negative for pallor and rash.  Neurological:  Negative for syncope and headaches.  Psychiatric/Behavioral:  Negative for agitation and confusion.    Physical Exam Updated Vital Signs BP 112/74    Pulse 78    Temp 98.5 F (36.9 C)    Resp (!) 23    SpO2 96%   Physical Exam Vitals and nursing note reviewed.  Constitutional:      General: He is not in acute distress.    Appearance: Normal appearance. He is well-developed. He is not ill-appearing, toxic-appearing or diaphoretic.  HENT:     Head: Normocephalic and atraumatic.     Right Ear: External ear normal.     Left Ear: External ear normal.     Mouth/Throat:     Mouth: Mucous membranes are moist.  Eyes:     General: No scleral icterus. Cardiovascular:     Rate and Rhythm: Regular rhythm. Tachycardia present.     Pulses: Normal pulses.     Heart sounds: Normal heart sounds.  Pulmonary:     Effort: Pulmonary effort is normal. No respiratory distress.     Breath sounds: Normal breath sounds.  Abdominal:     General: Abdomen is flat.     Palpations: Abdomen is soft.     Tenderness: There is no abdominal tenderness.  Genitourinary:    Testes:        Right: Swelling present.        Left: Swelling present.  Musculoskeletal:        General: Normal range of motion.     Cervical back: Normal range of motion.     Right lower leg: No edema.     Left lower leg: No edema.  Skin:    General: Skin is warm and dry.     Capillary Refill: Capillary refill takes less than 2  seconds.  Neurological:     Mental Status: He is alert and oriented to person, place, and time.  Psychiatric:         Mood and Affect: Mood normal.        Behavior: Behavior normal.    ED Results / Procedures / Treatments   Labs (all labs ordered are listed, but only abnormal results are displayed) Labs Reviewed  COMPREHENSIVE METABOLIC PANEL - Abnormal; Notable for the following components:      Result Value   Glucose, Bld 114 (*)    Creatinine, Ser 1.38 (*)    AST 14 (*)    All other components within normal limits  CBC - Abnormal; Notable for the following components:   RDW 15.9 (*)    All other components within normal limits  URINALYSIS, ROUTINE W REFLEX MICROSCOPIC - Abnormal; Notable for the following components:   Specific Gravity, Urine 1.034 (*)    Glucose, UA >1,000 (*)    Protein, ur TRACE (*)    All other components within normal limits  BRAIN NATRIURETIC PEPTIDE - Abnormal; Notable for the following components:   B Natriuretic Peptide 217.4 (*)    All other components within normal limits  D-DIMER, QUANTITATIVE - Abnormal; Notable for the following components:   D-Dimer, Quant 1.83 (*)    All other components within normal limits  LIPASE, BLOOD  TROPONIN I (HIGH SENSITIVITY)  TROPONIN I (HIGH SENSITIVITY)    EKG EKG Interpretation  Date/Time:  Tuesday December 24 2021 23:03:01 EST Ventricular Rate:  78 PR Interval:  175 QRS Duration: 88 QT Interval:  412 QTC Calculation: 454 R Axis:   -9 Text Interpretation: Sinus rhythm Atrial premature complex Probable left atrial enlargement Borderline T wave abnormalities Confirmed by Sherwood Gambler [phone]) on 12/25/2021 8:36:15 AM  Radiology CT Angio Chest PE W and/or Wo Contrast  Result Date: 12/24/2021 CLINICAL DATA:  Pulmonary embolus suspected.  Positive D-dimer. EXAM: CT ANGIOGRAPHY CHEST WITH CONTRAST TECHNIQUE: Multidetector CT imaging of the chest was performed using the standard protocol during bolus administration of intravenous contrast. Multiplanar CT image reconstructions and MIPs were obtained to evaluate the  vascular anatomy. CONTRAST:  62mL OMNIPAQUE IOHEXOL 350 MG/ML SOLN COMPARISON:  05/01/2021 FINDINGS: Cardiovascular: There is good opacification of the central and segmental pulmonary arteries. No focal filling defects. No evidence of significant pulmonary embolus. Normal heart size. No pericardial effusions. Normal caliber thoracic aorta with scattered calcification. Calcification in the coronary arteries. Mediastinum/Nodes: No enlarged mediastinal, hilar, or axillary lymph nodes. Thyroid gland, trachea, and esophagus demonstrate no significant findings. Lungs/Pleura: Lungs are clear. No pleural effusion or pneumothorax. Upper Abdomen: No acute abnormality. Musculoskeletal: Degenerative changes in the spine. No destructive bone lesions. Review of the MIP images confirms the above findings. IMPRESSION: No evidence of significant pulmonary embolus. No evidence of active pulmonary disease. Electronically Signed   By: Lucienne Capers M.D.   On: 12/24/2021 21:25    Procedures Procedures   Medications Ordered in ED Medications  iohexol (OMNIPAQUE) 350 MG/ML injection 80 mL (80 mLs Intravenous Contrast Given 12/24/21 2111)  metoprolol tartrate (LOPRESSOR) tablet 25 mg (25 mg Oral Given 12/24/21 2213)    ED Course  I have reviewed the triage vital signs and the nursing notes.  Pertinent labs & imaging results that were available during my care of the patient were reviewed by me and considered in my medical decision making (see chart for details).    MDM Rules/Calculators/A&P  CC: palpitations, leg /abd swelling, cp  This patient complains of above; this involves an extensive number of treatment options and is a complaint that carries with it a high risk of complications and morbidity. Vital signs were reviewed. Serious etiologies considered.  Record review:  Previous records obtained and reviewed    Work up as above, notable for:  Labs & imaging results that were  available during my care of the patient were reviewed by me and considered in my medical decision making.   Elevated well's score, d-dimer elevated, CTPE ordered- no evidence of acute PE  I ordered imaging studies which included CTPE and I independently visualized and interpreted imaging which showed no acute process  Management: Patient with persistent tachycardia, he has run out of his methimazole.  We will give patient metoprolol and methimazole.  Pt did not take home BP meds this evening, likely why his HR was elevated, it has improved with oral metoprolol. Pt overall feeling much better. Ambulatory.  Low suspicion for thyroid storm.  Advised him to f/u with pcp/[facility] regarding medications.   Troponin negative, EKG negative, suspicion for ACS is very low.  Discomfort likely secondary to elevated heart rate, thyroid  medication noncompliance.  The patient improved significantly and was discharged in stable condition. Detailed discussions were had with the patient regarding current findings, and need for close f/u with PCP or on call doctor. The patient has been instructed to return immediately if the symptoms worsen in any way for re-evaluation. Patient verbalized understanding and is in agreement with current care plan. All questions answered prior to discharge.           This chart was dictated using voice recognition software.  Despite best efforts to proofread,  errors can occur which can change the documentation meaning.    Final Clinical Impression(s) / ED Diagnoses Final diagnoses:  Medication refill  Chronic heart failure with preserved ejection fraction (HCC)  Tachycardia    Rx / DC Orders ED Discharge Orders          Ordered    furosemide (LASIX) 40 MG tablet  Daily        12/24/21 2320    methimazole (TAPAZOLE) 10 MG tablet  Daily        12/24/21 2320             Jeanell Sparrow, DO 12/26/21 [phone]

## 2021-12-26 ENCOUNTER — Encounter (HOSPITAL_BASED_OUTPATIENT_CLINIC_OR_DEPARTMENT_OTHER): Payer: Self-pay | Admitting: Emergency Medicine

## 2021-12-27 ENCOUNTER — Ambulatory Visit: Payer: BC Managed Care – PPO | Admitting: Family

## 2022-01-03 ENCOUNTER — Other Ambulatory Visit: Payer: Self-pay | Admitting: Family Medicine

## 2022-01-03 DIAGNOSIS — I10 Essential (primary) hypertension: Secondary | ICD-10-CM

## 2022-01-03 DIAGNOSIS — I4891 Unspecified atrial fibrillation: Secondary | ICD-10-CM

## 2022-01-07 ENCOUNTER — Other Ambulatory Visit: Payer: Self-pay | Admitting: Family Medicine

## 2022-01-07 DIAGNOSIS — I10 Essential (primary) hypertension: Secondary | ICD-10-CM

## 2022-01-17 ENCOUNTER — Ambulatory Visit: Payer: BC Managed Care – PPO | Admitting: Family

## 2022-01-23 ENCOUNTER — Ambulatory Visit: Payer: BC Managed Care – PPO | Admitting: Family

## 2022-02-07 ENCOUNTER — Telehealth: Payer: Self-pay | Admitting: Family

## 2022-02-07 DIAGNOSIS — I4891 Unspecified atrial fibrillation: Secondary | ICD-10-CM

## 2022-02-07 DIAGNOSIS — I2782 Chronic pulmonary embolism: Secondary | ICD-10-CM

## 2022-02-08 ENCOUNTER — Emergency Department (HOSPITAL_BASED_OUTPATIENT_CLINIC_OR_DEPARTMENT_OTHER): Payer: BC Managed Care – PPO | Admitting: Radiology

## 2022-02-08 ENCOUNTER — Inpatient Hospital Stay (HOSPITAL_BASED_OUTPATIENT_CLINIC_OR_DEPARTMENT_OTHER)
Admission: EM | Admit: 2022-02-08 | Discharge: 2022-02-12 | DRG: 273 | Disposition: A | Discharge status: Home / Self Care | Payer: BC Managed Care – PPO | Attending: Internal Medicine | Admitting: Internal Medicine

## 2022-02-08 ENCOUNTER — Encounter (HOSPITAL_BASED_OUTPATIENT_CLINIC_OR_DEPARTMENT_OTHER): Payer: Self-pay

## 2022-02-08 ENCOUNTER — Other Ambulatory Visit: Payer: Self-pay

## 2022-02-08 DIAGNOSIS — I77810 Thoracic aortic ectasia: Secondary | ICD-10-CM | POA: Diagnosis present

## 2022-02-08 DIAGNOSIS — E039 Hypothyroidism, unspecified: Secondary | ICD-10-CM | POA: Diagnosis not present

## 2022-02-08 DIAGNOSIS — E1169 Type 2 diabetes mellitus with other specified complication: Secondary | ICD-10-CM | POA: Diagnosis not present

## 2022-02-08 DIAGNOSIS — E0590 Thyrotoxicosis, unspecified without thyrotoxic crisis or storm: Secondary | ICD-10-CM | POA: Diagnosis present

## 2022-02-08 DIAGNOSIS — I808 Phlebitis and thrombophlebitis of other sites: Secondary | ICD-10-CM | POA: Diagnosis not present

## 2022-02-08 DIAGNOSIS — I10 Essential (primary) hypertension: Secondary | ICD-10-CM | POA: Diagnosis not present

## 2022-02-08 DIAGNOSIS — I5043 Acute on chronic combined systolic (congestive) and diastolic (congestive) heart failure: Secondary | ICD-10-CM | POA: Diagnosis present

## 2022-02-08 DIAGNOSIS — Z833 Family history of diabetes mellitus: Secondary | ICD-10-CM

## 2022-02-08 DIAGNOSIS — G4733 Obstructive sleep apnea (adult) (pediatric): Secondary | ICD-10-CM | POA: Diagnosis present

## 2022-02-08 DIAGNOSIS — E785 Hyperlipidemia, unspecified: Secondary | ICD-10-CM | POA: Diagnosis present

## 2022-02-08 DIAGNOSIS — I483 Typical atrial flutter: Secondary | ICD-10-CM | POA: Diagnosis not present

## 2022-02-08 DIAGNOSIS — N182 Chronic kidney disease, stage 2 (mild): Secondary | ICD-10-CM | POA: Diagnosis not present

## 2022-02-08 DIAGNOSIS — E119 Type 2 diabetes mellitus without complications: Secondary | ICD-10-CM | POA: Diagnosis present

## 2022-02-08 DIAGNOSIS — I4891 Unspecified atrial fibrillation: Secondary | ICD-10-CM | POA: Diagnosis present

## 2022-02-08 DIAGNOSIS — E1122 Type 2 diabetes mellitus with diabetic chronic kidney disease: Secondary | ICD-10-CM | POA: Diagnosis not present

## 2022-02-08 DIAGNOSIS — R0609 Other forms of dyspnea: Secondary | ICD-10-CM | POA: Diagnosis present

## 2022-02-08 DIAGNOSIS — Z7901 Long term (current) use of anticoagulants: Secondary | ICD-10-CM

## 2022-02-08 DIAGNOSIS — I5031 Acute diastolic (congestive) heart failure: Secondary | ICD-10-CM | POA: Diagnosis not present

## 2022-02-08 DIAGNOSIS — I4892 Unspecified atrial flutter: Secondary | ICD-10-CM | POA: Diagnosis not present

## 2022-02-08 DIAGNOSIS — Z6833 Body mass index (BMI) 33.0-33.9, adult: Secondary | ICD-10-CM | POA: Diagnosis not present

## 2022-02-08 DIAGNOSIS — R0602 Shortness of breath: Secondary | ICD-10-CM | POA: Diagnosis not present

## 2022-02-08 DIAGNOSIS — Z91199 Patient's noncompliance with other medical treatment and regimen due to unspecified reason: Secondary | ICD-10-CM | POA: Diagnosis not present

## 2022-02-08 DIAGNOSIS — I43 Cardiomyopathy in diseases classified elsewhere: Secondary | ICD-10-CM

## 2022-02-08 DIAGNOSIS — F17290 Nicotine dependence, other tobacco product, uncomplicated: Secondary | ICD-10-CM | POA: Diagnosis present

## 2022-02-08 DIAGNOSIS — E118 Type 2 diabetes mellitus with unspecified complications: Secondary | ICD-10-CM | POA: Diagnosis present

## 2022-02-08 DIAGNOSIS — I130 Hypertensive heart and chronic kidney disease with heart failure and stage 1 through stage 4 chronic kidney disease, or unspecified chronic kidney disease: Secondary | ICD-10-CM | POA: Diagnosis present

## 2022-02-08 DIAGNOSIS — Z83438 Family history of other disorder of lipoprotein metabolism and other lipidemia: Secondary | ICD-10-CM

## 2022-02-08 DIAGNOSIS — I5042 Chronic combined systolic (congestive) and diastolic (congestive) heart failure: Secondary | ICD-10-CM | POA: Diagnosis not present

## 2022-02-08 DIAGNOSIS — Z791 Long term (current) use of non-steroidal anti-inflammatories (NSAID): Secondary | ICD-10-CM

## 2022-02-08 DIAGNOSIS — E669 Obesity, unspecified: Secondary | ICD-10-CM | POA: Diagnosis not present

## 2022-02-08 DIAGNOSIS — Z86718 Personal history of other venous thrombosis and embolism: Secondary | ICD-10-CM

## 2022-02-08 DIAGNOSIS — Z86711 Personal history of pulmonary embolism: Secondary | ICD-10-CM | POA: Diagnosis present

## 2022-02-08 DIAGNOSIS — Z8249 Family history of ischemic heart disease and other diseases of the circulatory system: Secondary | ICD-10-CM

## 2022-02-08 DIAGNOSIS — Z7952 Long term (current) use of systemic steroids: Secondary | ICD-10-CM

## 2022-02-08 DIAGNOSIS — I480 Paroxysmal atrial fibrillation: Secondary | ICD-10-CM | POA: Diagnosis not present

## 2022-02-08 DIAGNOSIS — I4430 Unspecified atrioventricular block: Secondary | ICD-10-CM | POA: Diagnosis present

## 2022-02-08 DIAGNOSIS — I5021 Acute systolic (congestive) heart failure: Secondary | ICD-10-CM | POA: Diagnosis not present

## 2022-02-08 DIAGNOSIS — I5032 Chronic diastolic (congestive) heart failure: Secondary | ICD-10-CM | POA: Diagnosis not present

## 2022-02-08 DIAGNOSIS — K219 Gastro-esophageal reflux disease without esophagitis: Secondary | ICD-10-CM | POA: Diagnosis present

## 2022-02-08 DIAGNOSIS — J309 Allergic rhinitis, unspecified: Secondary | ICD-10-CM | POA: Diagnosis present

## 2022-02-08 DIAGNOSIS — Z79899 Other long term (current) drug therapy: Secondary | ICD-10-CM

## 2022-02-08 DIAGNOSIS — Z20822 Contact with and (suspected) exposure to covid-19: Secondary | ICD-10-CM | POA: Diagnosis not present

## 2022-02-08 DIAGNOSIS — R000 Tachycardia, unspecified: Secondary | ICD-10-CM | POA: Diagnosis not present

## 2022-02-08 DIAGNOSIS — I428 Other cardiomyopathies: Secondary | ICD-10-CM

## 2022-02-08 DIAGNOSIS — Z888 Allergy status to other drugs, medicaments and biological substances status: Secondary | ICD-10-CM

## 2022-02-08 DIAGNOSIS — I809 Phlebitis and thrombophlebitis of unspecified site: Secondary | ICD-10-CM

## 2022-02-08 LAB — CBC
HCT: 44.2 % (ref 39.0–52.0)
Hemoglobin: 14.2 g/dL (ref 13.0–17.0)
MCH: 26.3 pg (ref 26.0–34.0)
MCHC: 32.1 g/dL (ref 30.0–36.0)
MCV: 82 fL (ref 80.0–100.0)
Platelets: 210 10*3/uL (ref 150–400)
RBC: 5.39 MIL/uL (ref 4.22–5.81)
RDW: 16.2 % — ABNORMAL HIGH (ref 11.5–15.5)
WBC: 4.3 10*3/uL (ref 4.0–10.5)
nRBC: 0 % (ref 0.0–0.2)

## 2022-02-08 LAB — RESP PANEL BY RT-PCR (FLU A&B, COVID) ARPGX2
Influenza A by PCR: NEGATIVE
Influenza B by PCR: NEGATIVE
SARS Coronavirus 2 by RT PCR: NEGATIVE

## 2022-02-08 LAB — HEMOGLOBIN A1C
Hgb A1c MFr Bld: 6.7 % — ABNORMAL HIGH (ref 4.8–5.6)
Mean Plasma Glucose: 145.59 mg/dL

## 2022-02-08 LAB — BASIC METABOLIC PANEL
Anion gap: 9 (ref 5–15)
BUN: 9 mg/dL (ref 6–20)
CO2: 24 mmol/L (ref 22–32)
Calcium: 8.7 mg/dL — ABNORMAL LOW (ref 8.9–10.3)
Chloride: 105 mmol/L (ref 98–111)
Creatinine, Ser: 1.15 mg/dL (ref 0.61–1.24)
GFR, Estimated: >60 mL/min (ref >60)
Glucose, Bld: 211 mg/dL — ABNORMAL HIGH (ref 70–99)
Potassium: 3.7 mmol/L (ref 3.5–5.1)
Sodium: 138 mmol/L (ref 135–145)

## 2022-02-08 LAB — TROPONIN I (HIGH SENSITIVITY)
Troponin I (High Sensitivity): 11 ng/L (ref <18)
Troponin I (High Sensitivity): 9 ng/L (ref <18)

## 2022-02-08 LAB — BRAIN NATRIURETIC PEPTIDE: B Natriuretic Peptide: 270.2 pg/mL — ABNORMAL HIGH (ref 0.0–100.0)

## 2022-02-08 LAB — GLUCOSE, CAPILLARY: Glucose-Capillary: 130 mg/dL — ABNORMAL HIGH (ref 70–99)

## 2022-02-08 LAB — HIV ANTIBODY (ROUTINE TESTING W REFLEX): HIV Screen 4th Generation wRfx: NONREACTIVE

## 2022-02-08 LAB — TSH: TSH: 0.523 u[IU]/mL (ref 0.350–4.500)

## 2022-02-08 MED ORDER — METHIMAZOLE 10 MG PO TABS
10.0000 mg | ORAL_TABLET | Freq: Every day | ORAL | Status: DC
  Administered 2022-02-09 – 2022-02-12 (×4): 10 mg via ORAL
  Filled 2022-02-08 (×4): qty 1

## 2022-02-08 MED ORDER — ACETAMINOPHEN 650 MG RE SUPP: 650.0000 mg | Freq: Four times a day (QID) | RECTAL | Status: DC | PRN

## 2022-02-08 MED ORDER — SODIUM CHLORIDE 0.9% FLUSH: 3.0000 mL | INTRAVENOUS | Status: DC | PRN

## 2022-02-08 MED ORDER — INSULIN ASPART 100 UNIT/ML IJ SOLN
0.0000 [IU] | Freq: Three times a day (TID) | INTRAMUSCULAR | Status: DC
  Administered 2022-02-08 – 2022-02-09 (×4): 1 [IU] via SUBCUTANEOUS
  Administered 2022-02-10: 2 [IU] via SUBCUTANEOUS
  Administered 2022-02-10: 22:00:00 3 [IU] via SUBCUTANEOUS
  Administered 2022-02-11 – 2022-02-12 (×2): 2 [IU] via SUBCUTANEOUS

## 2022-02-08 MED ORDER — DILTIAZEM HCL-DEXTROSE 125-5 MG/125ML-% IV SOLN (PREMIX)
5.0000 mg/h | INTRAVENOUS | Status: DC
  Administered 2022-02-08: 5 mg/h via INTRAVENOUS
  Administered 2022-02-09: 10 mg/h via INTRAVENOUS
  Filled 2022-02-08 (×3): qty 125

## 2022-02-08 MED ORDER — DAPAGLIFLOZIN PROPANEDIOL 5 MG PO TABS
5.0000 mg | ORAL_TABLET | Freq: Every day | ORAL | Status: DC
  Administered 2022-02-09 – 2022-02-12 (×4): 5 mg via ORAL
  Filled 2022-02-08 (×4): qty 1

## 2022-02-08 MED ORDER — ACETAMINOPHEN 325 MG PO TABS
650.0000 mg | ORAL_TABLET | Freq: Four times a day (QID) | ORAL | Status: DC | PRN
  Administered 2022-02-09: 650 mg via ORAL
  Filled 2022-02-08: qty 2

## 2022-02-08 MED ORDER — SENNOSIDES-DOCUSATE SODIUM 8.6-50 MG PO TABS: 1.0000 | ORAL_TABLET | Freq: Every evening | ORAL | Status: DC | PRN

## 2022-02-08 MED ORDER — RIVAROXABAN 20 MG PO TABS
20.0000 mg | ORAL_TABLET | Freq: Every day | ORAL | Status: DC
  Administered 2022-02-09 – 2022-02-12 (×4): 20 mg via ORAL
  Filled 2022-02-08 (×4): qty 1

## 2022-02-08 MED ORDER — FUROSEMIDE 40 MG PO TABS
40.0000 mg | ORAL_TABLET | Freq: Every day | ORAL | Status: DC
  Administered 2022-02-08 – 2022-02-12 (×5): 40 mg via ORAL
  Filled 2022-02-08 (×5): qty 1

## 2022-02-08 MED ORDER — SODIUM CHLORIDE 0.9 % IV SOLN: 250.0000 mL | INTRAVENOUS | Status: DC | PRN

## 2022-02-08 MED ORDER — LINAGLIPTIN 5 MG PO TABS
5.0000 mg | ORAL_TABLET | Freq: Every day | ORAL | Status: DC
  Administered 2022-02-09 – 2022-02-12 (×4): 5 mg via ORAL
  Filled 2022-02-08 (×4): qty 1

## 2022-02-08 MED ORDER — METOPROLOL SUCCINATE ER 50 MG PO TB24
50.0000 mg | ORAL_TABLET | Freq: Every day | ORAL | Status: DC
Start: 2022-02-08 — End: 2022-02-12
  Administered 2022-02-08 – 2022-02-12 (×5): 50 mg via ORAL
  Filled 2022-02-08 (×5): qty 1

## 2022-02-08 MED ORDER — SODIUM CHLORIDE 0.9% FLUSH
3.0000 mL | Freq: Two times a day (BID) | INTRAVENOUS | Status: DC
  Administered 2022-02-08 – 2022-02-12 (×3): 3 mL via INTRAVENOUS

## 2022-02-08 MED ORDER — METOPROLOL TARTRATE 5 MG/5ML IV SOLN
5.0000 mg | INTRAVENOUS | Status: DC | PRN
  Administered 2022-02-08: 5 mg via INTRAVENOUS
  Filled 2022-02-08: qty 5

## 2022-02-08 MED ORDER — ATORVASTATIN CALCIUM 40 MG PO TABS
40.0000 mg | ORAL_TABLET | Freq: Every day | ORAL | Status: DC
  Administered 2022-02-09 – 2022-02-12 (×4): 40 mg via ORAL
  Filled 2022-02-08 (×4): qty 1

## 2022-02-08 MED ORDER — LOSARTAN POTASSIUM 50 MG PO TABS
100.0000 mg | ORAL_TABLET | Freq: Every day | ORAL | Status: DC
  Administered 2022-02-08 – 2022-02-12 (×5): 100 mg via ORAL
  Filled 2022-02-08 (×5): qty 2

## 2022-02-08 NOTE — Assessment & Plan Note (Addendum)
Continue with methimazole.  TSH on admission within normal limits.

## 2022-02-08 NOTE — Assessment & Plan Note (Signed)
Continue xarelto

## 2022-02-08 NOTE — ED Triage Notes (Signed)
Patient here POV from Home with SOB.  Patient states he has been feeling SOB for 1 month and has been worsening since.   No Fevers. Worse when lying flat. Dry Cough.  No Changes in Medications recently. No Nausea or Vomiting.  NAD Noted during Triage. A&Ox4. GCS 15. Ambulatory.

## 2022-02-08 NOTE — ED Notes (Signed)
EKG shot and given to MD

## 2022-02-08 NOTE — Assessment & Plan Note (Addendum)
Today with no signs of decompensation, his transesophageal echocardiogram showed EF LV 35% with hypokinesis, consistent with reduced ejection fraction heart failure (new).   Patient will need follow up echocardiogram as outpatient to follow up on EF after returning to sinus rhythm.

## 2022-02-08 NOTE — Assessment & Plan Note (Addendum)
Blood pressure has been elevated today up to 151/105 mmHg  Plan to continue with metoprolol and losartan. Diuretic therapy with furosemide.  Will add hydralazine 50 mg po bid.

## 2022-02-08 NOTE — ED Notes (Signed)
Report given to [facility] 

## 2022-02-08 NOTE — Assessment & Plan Note (Addendum)
Echocardiogram with preserved LV systolic function with EF 55 to 60% with preserved RV systolic function, mild dilatation of the aortic root, with no significant valvular disease.   Rate continue to be controlled with diltiazem and metoprolol.  TEE today with no appendage clot, with depressed LVEF 35% with global hypokinesis.  Radiofrequency ablation with successful conversion to sinus rhythm.   Plan to continue diltiazem and metoprolol,  Anticoagulation with rivaroxaban  Possible dc home in am.

## 2022-02-08 NOTE — H&P (Signed)
History and Physical    Patient: Spencer English OVZ:[id number] DOB: [birth year] DOA: 02/08/2022 DOS: the patient was seen and examined on 02/08/2022 PCP: Spencer Noe, English  Patient coming from: Home  Chief Complaint:  Chief Complaint  Patient presents with   Shortness of Breath    HPI: Spencer English is English 56 y.o. male with medical history significant of PAF, HFpEF, HTN, hyperthyroidism, HLD, DMT2 who presents for shortness of breath.  He reports that since the end of December 2022 he has been having progressively worsening shortness of breath that gets worse with exertion.  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  He reports he has had intermittent vague chest pressure over the last 2 weeks but none today.  He did notice today some fluttering and palpitations in his central chest.  He does have chronic edema of his lower legs which has not worsened.  Reports has been taking his Xarelto and Lasix.  Does admit he missed English dose of each at the end of last week has been compliant since then.  He missed English dose of his methimazole alone last week as well.  He denies any fevers or chills.  He denies cough, abdominal pain nausea vomiting or diarrhea.  Last echocardiogram was 2019 which showed mild diastolic dysfunction with mild LVH.  He drinks beer twice English week.  He smokes cigars on the weekends.  He denies illicit drug use  Review of Systems: As mentioned in the history of present illness. All other systems reviewed and are negative. 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   GERD (gastroesophageal reflux  disease)    GOUT 10/24/2010   Hyperglycemia    HYPERTENSION 07/21/2007   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   Leukopenia    Long term (current) use of anticoagulants 04/03/2011   PULMONARY EMBOLISM 05/03/2009   Sleep apnea    CPAP at bedtime   UNSPECIFIED URINARY CALCULUS    Past Surgical History:  Procedure Laterality Date   ANTERIOR CRUCIATE LIGAMENT REPAIR  2000   Right   CYSTECTOMY     back of head   KNEE ARTHROSCOPY     left knee   KNEE ARTHROSCOPY  01/03/2013   Procedure: ARTHROSCOPY KNEE;  Surgeon: Spencer English;  Location: [place];  Service: Orthopedics;  Laterality: Left;  Lavage Synovectomy, Removal of loose body   KNEE ARTHROSCOPY W/ ACL RECONSTRUCTION     right knee   Social History:  reports that he has been smoking cigars. He has never used smokeless tobacco. He reports current alcohol use. He reports that he does not use drugs.  Allergies  Allergen Reactions   Metformin Diarrhea and Nausea Only   Viagra [Sildenafil Citrate] Other (See Comments)    headache    Family History  Problem Relation Age of Onset   Diabetes Mother    Hypertension Mother    Hyperlipidemia Mother    Dementia Mother    Diabetes Father    Hypertension Father    Hyperlipidemia Father    Heart  attack Father    Heart disease Father    Prostate cancer Father 76   Stomach cancer Neg Hx    Colon cancer Neg Hx    Pancreatic cancer Neg Hx    Esophageal cancer Neg Hx    Rectal cancer Neg Hx     Prior to Admission medications   Medication Sig Start Date End Date Taking? Authorizing Provider  acetaminophen (TYLENOL) 500 MG tablet Take 2 tablets (1,000 mg total) by mouth every 6 (six) hours as needed. 05/01/21   Spencer English  allopurinol (ZYLOPRIM) 300 MG tablet Take 300 mg by mouth as needed (gout flare up).    Provider, Historical, English  amoxicillin-clavulanate (AUGMENTIN) 875-125 MG tablet Take 1 tablet by mouth 2 (two) times daily. 04/22/21   Spencer English  atorvastatin (LIPITOR) 40 MG  tablet Take 1 tablet (40 mg total) by mouth daily. 12/20/20   Spencer Noe, English  benzonatate (TESSALON) 100 MG capsule Take 1-2 capsules (100-200 mg total) by mouth 3 (three) times daily as needed. 10/08/21   Spencer English  dapagliflozin propanediol (FARXIGA) 5 MG TABS tablet Take 1 tablet (5 mg total) by mouth daily. 11/01/20   Spencer Noe, English  fluticasone (FLONASE) 50 MCG/ACT nasal spray Place 1 spray into both nostrils daily. allergies    Provider, Historical, English  furosemide (LASIX) 40 MG tablet TAKE 1 TABLET BY MOUTH EVERY DAY 09/11/21   Spencer Noe, English  furosemide (LASIX) 40 MG tablet Take 1 tablet (40 mg total) by mouth daily. 12/24/21   Spencer Dove A, DO  glucose blood (CONTOUR NEXT TEST) test strip 1 each by Other route daily. And lancets 1/day 09/02/19   Spencer English  halobetasol (ULTRAVATE) 0.05 % cream APPLY CREAM TOPICALLY THREE TIMES DAILY AS NEEDED FOR RASH 12/02/18   Spencer English  lidocaine (LIDODERM) 5 % Place 1 patch onto the skin daily. Remove & Discard patch within 12 hours or as directed by English 12/11/21   Spencer Raring, English  losartan (COZAAR) 100 MG tablet Take 1 tablet (100 mg total) by mouth daily. 11/01/20   Spencer Noe, English  meloxicam (MOBIC) 7.5 MG tablet Take 1 tablet (7.5 mg total) by mouth daily. 12/11/21   Spencer Raring, English  methimazole (TAPAZOLE) 10 MG tablet TAKE 1 TABLET(10 MG) BY MOUTH DAILY 07/23/21   Spencer Noe, English  methimazole (TAPAZOLE) 10 MG tablet Take 1 tablet (10 mg total) by mouth daily. 12/24/21 01/23/22  Spencer Dove A, DO  metoprolol succinate (TOPROL-XL) 50 MG 24 hr tablet Take 1 tablet (50 mg total) by mouth daily. 12/18/20   Spencer Noe, English  predniSONE (DELTASONE) 20 MG tablet Take 2 tablets (40 mg total) by mouth daily. 04/07/21   Spencer English  promethazine-dextromethorphan (PROMETHAZINE-DM) 6.25-15 MG/5ML syrup Take 5 mLs by mouth at bedtime as needed for cough. 10/08/21   Spencer English  rivaroxaban (XARELTO) 20  MG TABS tablet TAKE 1 TABLET(20 MG) BY MOUTH DAILY WITH SUPPER 12/24/21 01/23/22  Spencer English  sitaGLIPtin (JANUVIA) 100 MG tablet Take 1 tablet (100 mg total) by mouth daily. 12/18/20   Spencer Noe, English  tiZANidine (ZANAFLEX) 4 MG tablet Take 1 tablet (4 mg total) by mouth every 8 (eight) hours as needed. 03/17/21   Spencer English  traMADol Veatrice Bourbon) 50 MG tablet 1-2 tablets every 6 hours as needed for pain 05/01/21   Spencer English  verapamil (CALAN-SR) 240 MG CR  tablet Take 1 tablet (240 mg total) by mouth at bedtime. 11/01/20   Spencer Noe, English    Physical Exam: Vitals:   02/08/22 1615 02/08/22 1630 02/08/22 1645 02/08/22 1840  BP: (!) 147/107 (!) 151/111 (!) 158/122 (!) 145/98  Pulse: (!) 133     Resp: (!) 28 (!) 21 16 20   Temp:    98.4 F (36.9 C)  TempSrc:    Oral  SpO2: 98% 97%  99%  Weight:    112.8 kg  Height:    6' (1.829 m)   General: WDWN, Alert and oriented x3.  Eyes: EOMI, PERRL, conjunctivae normal. Sclera nonicteric HENT:  [place]/AT, external ears normal.  Nares patent without epistasis.  Mucous membranes are moist. Posterior pharynx clear of any exudate  Neck: Soft, normal range of motion, supple, no masses, no thyromegaly.  Trachea midline Respiratory: clear to auscultation bilaterally, no wheezing, no crackles. Normal respiratory effort. No accessory muscle use.  Cardiovascular: Irregular rhythm, normal rate. no murmurs / rubs / gallops. Lower extremity edema. 2+ pedal pulses. Abdomen: Soft, no tenderness, nondistended, no rebound or guarding. Umbilical hernia present, reducible. No masses palpated. Bowel sounds normoactive Musculoskeletal: FROM. no cyanosis. No joint deformity upper and lower extremities. Normal muscle tone.  Skin: Warm, dry, intact no rashes, lesions, ulcers. No induration Neurologic: CN 2-12 grossly intact.  Normal speech.  Sensation intact intact. Strength 5/5 in all extremities.   Psychiatric: Normal judgment and insight.  Normal  mood.    Data Reviewed: Lab reveals an unremarkable CBC.  BNP 270.2.  Troponin 11.  Repeat troponin 9 glucose 211 calcium 8.7 remainder of BMP normal.  TSH 0.523.  COVID-negative.  Influenza English and B negative  EKG is reviewed and shows atrial flutter with occasional PVCs with LVH by voltage criteria.  QTc 429.  No acute ST elevation or depression  Assessment and Plan: * English-fib ([place])- (present on admission) Mr. Vollmer admitted to cardiac telemetry floor.  Is on cardizem infusion and will monitor heart rhythm. Will convert to oral medication in am if stabilized.  Continue xarelto.  Obtain Echo in am  Essential hypertension- (present on admission) Continue cozaar, metoprolol. Hold verapamil while on cardizem.  Monitor BP  Type 2 diabetes mellitus with other specified complication ([place])- (present on admission) Continue Januvia, farxiga.  Monitor blood sugar with meals and bedtime. SSI provided as needed for glycemic control Check HgbA1c level.  Check lipid panel Resume lipitor as pt states he has not been taking it and should be on statin with DM.  Chronic heart failure with preserved ejection fraction (HFpEF) (HCC) Continue cozaar, metoprolol, farxiga. Continue lasix Check echo in am  Hyperthyroidism- (present on admission) Continue methimazole TSH was 0.5  Chronic anticoagulation Continue xarelto   Advance Care Planning: CODE STATUS: Full code  Family Communication: Diagnosis and plan discussed with patient.  Patient verbalized understand agrees with plan.  Further recommendation to follow as clinically indicated  Author: Eben Burow, English 02/08/2022 7:52 PM  For on call review [email or web address].

## 2022-02-08 NOTE — ED Provider Notes (Signed)
[facility] EMERGENCY DEPT Provider Note   CSN: [id number] Arrival date & time: 02/08/22  1139     History  Chief Complaint  Patient presents with   Shortness of Breath    Spencer English is a 56 y.o. male.  Patient with history of hyperthyroidism on methimazole, on Lasix for lower extremity edema (no definitive history of previous heart failure), history of pulmonary embolism on Xarelto, history of paroxysmal atrial flutter on metoprolol --presents to the emergency department today for shortness of breath.  Patient states that he has been feeling poorly since December 2022.  He has had shortness of breath, worse with exertion since that time.  He has had some vague chest pressure over the past 2 weeks.  Today he was having more significant difficulties and shortness of breath walking up the stairs in his home.  No worsening lower extremity swelling.  He reports may be missing a dose of his blood thinner last week ended week before.  Previous notes show history of poor compliance with anticoagulation.  He states that he has been taking his thyroid medication.  No fevers.  He has had some night sweats and chills.  No significant cough.  No abdominal pain vomiting or diarrhea.      Home Medications Prior to Admission medications   Medication Sig Start Date End Date Taking? Authorizing Provider  acetaminophen (TYLENOL) 500 MG tablet Take 2 tablets (1,000 mg total) by mouth every 6 (six) hours as needed. 05/01/21   Charlesetta Shanks, MD  allopurinol (ZYLOPRIM) 300 MG tablet Take 300 mg by mouth as needed (gout flare up).    [provider]  amoxicillin-clavulanate (AUGMENTIN) 875-125 MG tablet Take 1 tablet by mouth 2 (two) times daily. 04/22/21   Lajean Saver, MD  atorvastatin (LIPITOR) 40 MG tablet Take 1 tablet (40 mg total) by mouth daily. 12/20/20   Lesleigh Noe, MD  benzonatate (TESSALON) 100 MG capsule Take 1-2 capsules (100-200 mg total) by mouth 3 (three) times  daily as needed. 10/08/21   Jaynee Eagles, PA-C  dapagliflozin propanediol (FARXIGA) 5 MG TABS tablet Take 1 tablet (5 mg total) by mouth daily. 11/01/20   Lesleigh Noe, MD  fluticasone (FLONASE) 50 MCG/ACT nasal spray Place 1 spray into both nostrils daily. allergies    [provider]  furosemide (LASIX) 40 MG tablet TAKE 1 TABLET BY MOUTH EVERY DAY 09/11/21   Lesleigh Noe, MD  furosemide (LASIX) 40 MG tablet Take 1 tablet (40 mg total) by mouth daily. 12/24/21   Wynona Dove A, DO  glucose blood (CONTOUR NEXT TEST) test strip 1 each by Other route daily. And lancets 1/day 09/02/19   Renato Shin, MD  halobetasol (ULTRAVATE) 0.05 % cream APPLY CREAM TOPICALLY THREE TIMES DAILY AS NEEDED FOR RASH 12/02/18   Renato Shin, MD  lidocaine (LIDODERM) 5 % Place 1 patch onto the skin daily. Remove & Discard patch within 12 hours or as directed by MD 12/11/21   Sherrill Raring, PA-C  losartan (COZAAR) 100 MG tablet Take 1 tablet (100 mg total) by mouth daily. 11/01/20   Lesleigh Noe, MD  meloxicam (MOBIC) 7.5 MG tablet Take 1 tablet (7.5 mg total) by mouth daily. 12/11/21   Sherrill Raring, PA-C  methimazole (TAPAZOLE) 10 MG tablet TAKE 1 TABLET(10 MG) BY MOUTH DAILY 07/23/21   Lesleigh Noe, MD  methimazole (TAPAZOLE) 10 MG tablet Take 1 tablet (10 mg total) by mouth daily. 12/24/21 01/23/22  Jeanell Sparrow, DO  metoprolol succinate (TOPROL-XL) 50 MG 24 hr tablet Take 1 tablet (50 mg total) by mouth daily. 12/18/20   Lesleigh Noe, MD  predniSONE (DELTASONE) 20 MG tablet Take 2 tablets (40 mg total) by mouth daily. 04/07/21   Davonna Belling, MD  promethazine-dextromethorphan (PROMETHAZINE-DM) 6.25-15 MG/5ML syrup Take 5 mLs by mouth at bedtime as needed for cough. 10/08/21   Jaynee Eagles, PA-C  rivaroxaban (XARELTO) 20 MG TABS tablet TAKE 1 TABLET(20 MG) BY MOUTH DAILY WITH SUPPER 12/24/21 01/23/22  Eugenia Pancoast, FNP  sitaGLIPtin (JANUVIA) 100 MG tablet Take 1 tablet (100 mg total) by mouth daily.  12/18/20   Lesleigh Noe, MD  tiZANidine (ZANAFLEX) 4 MG tablet Take 1 tablet (4 mg total) by mouth every 8 (eight) hours as needed. 03/17/21   Jaynee Eagles, PA-C  traMADol Veatrice Bourbon) 50 MG tablet 1-2 tablets every 6 hours as needed for pain 05/01/21   Charlesetta Shanks, MD  verapamil (CALAN-SR) 240 MG CR tablet Take 1 tablet (240 mg total) by mouth at bedtime. 11/01/20   Lesleigh Noe, MD      Allergies    Metformin and Viagra [sildenafil citrate]    Review of Systems   Review of Systems  Physical Exam Updated Vital Signs BP (!) 145/111 (BP Location: Right Arm)    Pulse (!) 143    Temp 98.2 F (36.8 C)    Resp (!) 26    Ht 6' (1.829 m)    Wt 117.9 kg    SpO2 99%    BMI 35.25 kg/m  Physical Exam Vitals and nursing note reviewed.  Constitutional:      Appearance: He is well-developed. He is not diaphoretic.  HENT:     Head: Normocephalic and atraumatic.     Mouth/Throat:     Mouth: Mucous membranes are not dry.  Eyes:     Conjunctiva/sclera: Conjunctivae normal.  Neck:     Vascular: Normal carotid pulses. No carotid bruit or JVD.     Trachea: Trachea normal. No tracheal deviation.  Cardiovascular:     Rate and Rhythm: Tachycardia present. Rhythm irregular.     Pulses: No decreased pulses.          Radial pulses are 2+ on the right side and 2+ on the left side.     Heart sounds: Normal heart sounds, S1 normal and S2 normal. Heart sounds not distant. No murmur heard. Pulmonary:     Effort: Pulmonary effort is normal. No respiratory distress.     Breath sounds: Normal breath sounds. No wheezing, rhonchi or rales.  Chest:     Chest wall: No tenderness.  Abdominal:     General: Bowel sounds are normal.     Palpations: Abdomen is soft.     Tenderness: There is no abdominal tenderness. There is no guarding or rebound.  Musculoskeletal:     Cervical back: Normal range of motion and neck supple. No muscular tenderness.     Right lower leg: No edema.     Left lower leg: No edema.   Skin:    General: Skin is warm and dry.     Coloration: Skin is not pale.  Neurological:     Mental Status: He is alert. Mental status is at baseline.  Psychiatric:        Mood and Affect: Mood normal.    ED Results / Procedures / Treatments   Labs (all labs ordered are listed, but only abnormal results are displayed) Labs Reviewed  BASIC METABOLIC  PANEL - Abnormal; Notable for the following components:      Result Value   Glucose, Bld 211 (*)    Calcium 8.7 (*)    All other components within normal limits  CBC - Abnormal; Notable for the following components:   RDW 16.2 (*)    All other components within normal limits  BRAIN NATRIURETIC PEPTIDE - Abnormal; Notable for the following components:   B Natriuretic Peptide 270.2 (*)    All other components within normal limits  TSH  TROPONIN I (HIGH SENSITIVITY)  TROPONIN I (HIGH SENSITIVITY)    EKG EKG Interpretation  Date/Time:  Saturday February 08 2022 11:52:26 EST Ventricular Rate:  123 PR Interval:    QRS Duration: 72 QT Interval:  364 QTC Calculation: 521 R Axis:   -19 Text Interpretation: Atrial flutter with variable A-V block Nonspecific ST and T wave abnormality Abnormal ECG When compared with ECG of 24-Dec-2021 23:03,  Pt now in atrial flutter, rate related ST changes present Confirmed by Gareth Morgan [id number]) on 02/08/2022 12:50:53 PM  Radiology DG Chest 2 View  Result Date: 02/08/2022 CLINICAL DATA:  56 year old male with history of shortness of breath. EXAM: CHEST - 2 VIEW COMPARISON:  Chest x-ray 05/07/2021. FINDINGS: Lung volumes are normal. No consolidative airspace disease. No pleural effusions. No pneumothorax. No pulmonary nodule or mass noted. Pulmonary vasculature and the cardiomediastinal silhouette are within normal limits. IMPRESSION: No radiographic evidence of acute cardiopulmonary disease. Electronically Signed   By: Vinnie Langton M.D.   On: 02/08/2022 13:20    Procedures Procedures     Medications Ordered in ED Medications  metoprolol tartrate (LOPRESSOR) injection 5 mg (5 mg Intravenous Given 02/08/22 1259)  diltiazem (CARDIZEM) 125 mg in dextrose 5% 125 mL (1 mg/mL) infusion (5 mg/hr Intravenous New Bag/Given 02/08/22 1403)    ED Course/ Medical Decision Making/ A&P    Patient seen and examined. History obtained directly from patient. Work-up including labs, imaging, EKG ordered in triage, if performed, were reviewed.  Previous cardiology clinic notes and ED notes reviewed.  Of note, ED visit for similar symptoms 12/24/2021.  Negative CTA of the chest at that time.  Labs/EKG: Labs ordered in triage and additional labs added. This included: CBC, BMP, troponin, BNP, TSH.  EKG personally reviewed showing atrial flutter with rapid ventricular response.  Imaging: Independently reviewed and interpreted.  This included: Chest x-ray, no significant edema or current elevation.  Medications/Fluids: Ordered: IV metoprolol.  Most recent vital signs reviewed and are as follows: BP (!) 140/102 (BP Location: Right Arm)    Pulse (!) 142    Temp 98.2 F (36.8 C)    Resp (!) 25    Ht 6' (1.829 m)    Wt 117.9 kg    SpO2 99%    BMI 35.25 kg/m   Initial impression: SOB and vague chest pressure, worsened today but patient has been symptomatic for more than a month. Feel symptoms today most likely related to aflutter with RVR. Questionable med complicance -- probably not candidate for ED cardioversion. Considered PE but feel arrhythmia is most likely cause of the patient's symptoms at this point.  No hypoxia or hypotension noted.  1:27 PM Reassessment performed. Patient appears comfortable. HR now 90-110 after metoprolol, still aflutter.   Most current vital signs reviewed and are as follows: BP (!) 132/105    Pulse 69    Temp 98.2 F (36.8 C)    Resp (!) 31    Ht 6' (1.829 m)  Wt 117.9 kg    SpO2 100%    BMI 35.25 kg/m   Plan: Monitoring, labs  1:44 PM Now back at HR 140.  Ordered cardizem drip.   3:27 PM Reassessment performed. Patient appears comfortable.   I recommended admission to the hospital.  Patient is reluctant to stay.  He states that he really has to work on Monday and want something to eat, more than a snack.  I giving him a few minutes to think it over and will recheck him.  Heart rate is now mostly in the low 100s, still a flutter with variable block.  4:16 PM Talked with patient -- he is willing to be admitted.   4:32 PM consulted with Dr. Roosevelt Locks [facility] hospitalist who accepts patient for admission.  CRITICAL CARE Performed by: Carlisle Cater PA-C Total critical care time: 45 minutes Critical care time was exclusive of separately billable procedures and treating other patients. Critical care was necessary to treat or prevent imminent or life-threatening deterioration. Critical care was time spent personally by me on the following activities: development of treatment plan with patient and/or surrogate as well as nursing, discussions with consultants, evaluation of patient's response to treatment, examination of patient, obtaining history from patient or surrogate, ordering and performing treatments and interventions, ordering and review of laboratory studies, ordering and review of radiographic studies, pulse oximetry and re-evaluation of patient's condition.                           Medical Decision Making Amount and/or Complexity of Data Reviewed Labs: ordered. Radiology: ordered.  Risk Prescription drug management.   Patient with rapid atrial flutter with associated shortness of breath and chest tightness.  Will need admission as he is requiring IV Cardizem drip for rate control at this time.  Low concern for recurrent PE at this time. TSH pending to see if hyperthyroidism could be contributing.         Final Clinical Impression(s) / ED Diagnoses Final diagnoses:  Atrial flutter with rapid ventricular response (HCC)  Shortness  of breath    Rx / DC Orders ED Discharge Orders          Ordered    Amb referral to [facility] Clinic        02/08/22 1251              Carlisle Cater, PA-C 02/08/22 1634    Gareth Morgan, MD 02/09/22 [phone]

## 2022-02-08 NOTE — Assessment & Plan Note (Addendum)
Continue with empagliflozin and linagliptin.  On statin therapy

## 2022-02-09 ENCOUNTER — Inpatient Hospital Stay (HOSPITAL_COMMUNITY): Payer: BC Managed Care – PPO

## 2022-02-09 DIAGNOSIS — E669 Obesity, unspecified: Secondary | ICD-10-CM | POA: Diagnosis not present

## 2022-02-09 DIAGNOSIS — I809 Phlebitis and thrombophlebitis of unspecified site: Secondary | ICD-10-CM

## 2022-02-09 DIAGNOSIS — I10 Essential (primary) hypertension: Secondary | ICD-10-CM

## 2022-02-09 DIAGNOSIS — I5032 Chronic diastolic (congestive) heart failure: Secondary | ICD-10-CM | POA: Diagnosis not present

## 2022-02-09 DIAGNOSIS — N182 Chronic kidney disease, stage 2 (mild): Secondary | ICD-10-CM

## 2022-02-09 DIAGNOSIS — I5031 Acute diastolic (congestive) heart failure: Secondary | ICD-10-CM | POA: Diagnosis not present

## 2022-02-09 DIAGNOSIS — I480 Paroxysmal atrial fibrillation: Secondary | ICD-10-CM | POA: Diagnosis not present

## 2022-02-09 LAB — CBC
HCT: 43.5 % (ref 39.0–52.0)
Hemoglobin: 14.2 g/dL (ref 13.0–17.0)
MCH: 27.1 pg (ref 26.0–34.0)
MCHC: 32.6 g/dL (ref 30.0–36.0)
MCV: 83 fL (ref 80.0–100.0)
Platelets: 201 10*3/uL (ref 150–400)
RBC: 5.24 MIL/uL (ref 4.22–5.81)
RDW: 16.3 % — ABNORMAL HIGH (ref 11.5–15.5)
WBC: 5.3 10*3/uL (ref 4.0–10.5)
nRBC: 0 % (ref 0.0–0.2)

## 2022-02-09 LAB — BASIC METABOLIC PANEL
Anion gap: 11 (ref 5–15)
BUN: 7 mg/dL (ref 6–20)
CO2: 20 mmol/L — ABNORMAL LOW (ref 22–32)
Calcium: 8.5 mg/dL — ABNORMAL LOW (ref 8.9–10.3)
Chloride: 108 mmol/L (ref 98–111)
Creatinine, Ser: 1.25 mg/dL — ABNORMAL HIGH (ref 0.61–1.24)
GFR, Estimated: >60 mL/min (ref >60)
Glucose, Bld: 128 mg/dL — ABNORMAL HIGH (ref 70–99)
Potassium: 4.4 mmol/L (ref 3.5–5.1)
Sodium: 139 mmol/L (ref 135–145)

## 2022-02-09 LAB — GLUCOSE, CAPILLARY
Glucose-Capillary: 130 mg/dL — ABNORMAL HIGH (ref 70–99)
Glucose-Capillary: 129 mg/dL — ABNORMAL HIGH (ref 70–99)
Glucose-Capillary: 144 mg/dL — ABNORMAL HIGH (ref 70–99)
Glucose-Capillary: 120 mg/dL — ABNORMAL HIGH (ref 70–99)

## 2022-02-09 LAB — ECHOCARDIOGRAM COMPLETE
AR max vel: 2.28 cm2
AV Area VTI: 2.32 cm2
AV Area mean vel: 2.28 cm2
AV Mean grad: 3 mmHg
AV Peak grad: 6.3 mmHg
Ao pk vel: 1.25 m/s
Area-P 1/2: 4.36 cm2
Height: 72 in
S' Lateral: 4.4 cm
Weight: 3955.2 oz

## 2022-02-09 MED ORDER — DILTIAZEM HCL 60 MG PO TABS
60.0000 mg | ORAL_TABLET | Freq: Four times a day (QID) | ORAL | Status: AC
  Administered 2022-02-09 – 2022-02-10 (×6): 60 mg via ORAL
  Filled 2022-02-09 (×6): qty 1

## 2022-02-09 NOTE — Assessment & Plan Note (Addendum)
Renal function stable with serum cr at 1,22 with K at 3,9 and serum bicarbonate at 20. Continue close follow up of renal function in am, avoid hypotension and nephrotoxic medications.

## 2022-02-09 NOTE — Progress Notes (Signed)
Pt had 10 beats of vtach  noted resting on bed , asymptomatic, denies SOB , nor chest discomforts., b/p stable.Dr Tonie Griffith notified, no new order, continue to monitor pt.

## 2022-02-09 NOTE — Assessment & Plan Note (Addendum)
On rivaroxaban for anticoagulation, possible non compliance.  Patient with chronic lower extremity edema.

## 2022-02-09 NOTE — Hospital Course (Addendum)
Spencer English was admitted to the hospital with the working diagnosis of atrial fibrillation with rapid ventricular response.   56 yo male with the past medical history of paroxysmal atrial fibrillation, heart failure, HTN, T2DM, and hyperthyroid who presented with dyspnea. He reported palpitations and fluttering in his chest. For the last 2 months positive dyspnea on exertion and he has not being compliant with his medications. On his initial physical examination his blood pressure was 147/107 HR 133, RR 28, oxygen saturation 97%. His lungs were clear to auscultation, heart with S1 and S2 present, irregularly irregular, abdomen soft and positive lower extremity edema.   Na 138, K 3,7, CL 105, bicarbonate 24, glucose 211, bun 9 and cr 1,15 BNP 270 Wbc 4,3 hgb 14,2 hct 44, plt 210  Sars covid 19 negative  EKG 123 bpm, left axis deviation, atrial flutter rhythm with variable block, no significant ST segment or T wave changes.   Patient was placed on diltiazem drip for rate control and continue anticoagulation with rivaroxaban.   Successfully transitioned to oral diltiazem with good toleration.  Cardiology was consulted for typical flutter. 02/14 radiofrequency ablation of atrial flutter along the cavo- tricuspid isthmus with complete bidirectional isthmus block achieved.

## 2022-02-09 NOTE — Assessment & Plan Note (Addendum)
Clinically improved.  

## 2022-02-09 NOTE — Assessment & Plan Note (Signed)
Calculated BMI is 33,5  

## 2022-02-09 NOTE — Progress Notes (Signed)
Patient c/o 10/10 pain at left forearm IV site.  Slightly swollen but does not appear infiltrated.  IV cardizem stopped and IV removed.  IV team notified for  STAT IV placement.  Will continue to monitor LFA site. Tylenol given for pain.

## 2022-02-09 NOTE — Progress Notes (Addendum)
Progress Note   Patient: Spencer English [id number]:[id number] DOB: 1966/08/24 DOA: 02/08/2022     1 DOS: the patient was seen and examined on 02/09/2022   Brief hospital course: Mr. Bolger was admitted to the hospital with the working diagnosis of atrial fibrillation with rapid ventricular response.   56 yo male with the past medical history of paroxysmal atrial fibrillation, heart failure, HTN, T2DM, and hyperthyroid who presented with dyspnea. He reported palpitations and fluttering in his chest. For the last 2 months positive dyspnea on exertion and he has not being compliant with his medications. On his initial physical examination his blood pressure was 147/107 HR 133, RR 28, oxygen saturation 97%. His lungs were clear to auscultation, heart with S1 and S2 present, irregularly irregular, abdomen soft and positive lower extremity edema.   Na 138, K 3,7, CL 105, bicarbonate 24, glucose 211, bun 9 and cr 1,15 BNP 270 Wbc 4,3 hgb 14,2 hct 44, plt 210  Sars covid 19 negative  EKG 123 bpm, left axis deviation, atrial flutter rhythm with variable block, no significant ST segment or T wave changes.   Patient was placed on diltiazem drip for rate control and continue anticoagulation with rivaroxaban.      Assessment and Plan: * A-fib ([place])- (present on admission) Patient with improved rate control with diltiazem with HR 70's He has received 50 mg of metoprolol XL this am, will transition to oral diltiazem 60 mg qid, to continue rate control. Diltiazem drip was running at 10 ml per hr when he lost his IV access.  Continue anticoagulation with rivaroxaban and follow up with echocardiogram.   Essential hypertension- (present on admission) Blood pressure has been stable with systolic at 203 to 559 mmHg. Plan to continue metoprolol and losartan.   Chronic heart failure with preserved ejection fraction (HFpEF) ([place])- (present on admission) Patient with no clinical signs of acute heart failure, plan to  continue HR control and blood pressure control. Continue with oral diuretic therapy with furosemide 40 gm daily.   Hyperthyroidism- (present on admission) Continue with methimazole.  TSH on admission within normal limits.   Type 2 diabetes mellitus with hyperlipidemia ([place])- (present on admission) Fasting glucose this am 128, continue with empagliflozin and linagliptin.  Patient is tolerating po well.  Continue with statin therapy.   CKD (chronic kidney disease) stage 2, GFR 60-89 ml/min Renal function stable with serum cr at 1,25 with K at 4,4 and serum bicarbonate at 20. Base cr around 44,15 old records personally reviewed.   Class 1 obesity- (present on admission) Calculated BMI is 33,5.   History of pulmonary embolus (PE)- (present on admission) Continue anticoagulation with rivaroxaban. Patient with chronic lower extremity edema.   Phlebitis Right forearm phlebitis, associated with IV access. IV has been removed.  Continue local care.   Chronic anticoagulation Continue xarelto        Subjective: Patient with left forearm pain, at the site of IV access. Palpitations have improved, no dyspnea or worsening lower extremity edema.   Physical Exam: Vitals:   02/08/22 1645 02/08/22 1840 02/09/22 0618 02/09/22 0838  BP: (!) 158/122 (!) 145/98 129/90 (!) 127/92  Pulse:   72 72  Resp: 16 20 20 20   Temp:  98.4 F (36.9 C) 98.4 F (36.9 C) 98.5 F (36.9 C)  TempSrc:  Oral Oral Oral  SpO2:  99% 95% 96%  Weight:  112.8 kg 112.1 kg   Height:  6' (1.829 m)     Neurology awake and  alert ENT with no pallor or icterus Cardiovascular with S1 and S2 present irregular, with no gallops or rubs.  No JVD Positive lower extremity edema non pitting more left than right.  Respiratory with no wheezing, rales or rhonchi Abdomen soft and non tender   Data Reviewed:    Family Communication: no family at the bedside   Disposition: Status is: Inpatient Remains inpatient  appropriate because: atrial flutter with RVR       Planned Discharge Destination: Home     Author: Tawni Millers, MD 02/09/2022 10:22 AM  For on call review [email or web address].

## 2022-02-09 NOTE — Progress Notes (Signed)
Echocardiogram 2D Echocardiogram has been performed.  Arlyss Gandy 02/09/2022, 1:12 PM

## 2022-02-10 DIAGNOSIS — Z86711 Personal history of pulmonary embolism: Secondary | ICD-10-CM

## 2022-02-10 DIAGNOSIS — E785 Hyperlipidemia, unspecified: Secondary | ICD-10-CM

## 2022-02-10 DIAGNOSIS — I483 Typical atrial flutter: Secondary | ICD-10-CM

## 2022-02-10 DIAGNOSIS — I480 Paroxysmal atrial fibrillation: Secondary | ICD-10-CM | POA: Diagnosis not present

## 2022-02-10 DIAGNOSIS — I5032 Chronic diastolic (congestive) heart failure: Secondary | ICD-10-CM | POA: Diagnosis not present

## 2022-02-10 DIAGNOSIS — E1169 Type 2 diabetes mellitus with other specified complication: Secondary | ICD-10-CM

## 2022-02-10 DIAGNOSIS — I10 Essential (primary) hypertension: Secondary | ICD-10-CM | POA: Diagnosis not present

## 2022-02-10 DIAGNOSIS — N182 Chronic kidney disease, stage 2 (mild): Secondary | ICD-10-CM | POA: Diagnosis not present

## 2022-02-10 DIAGNOSIS — E669 Obesity, unspecified: Secondary | ICD-10-CM | POA: Diagnosis not present

## 2022-02-10 LAB — GLUCOSE, CAPILLARY
Glucose-Capillary: 112 mg/dL — ABNORMAL HIGH (ref 70–99)
Glucose-Capillary: 187 mg/dL — ABNORMAL HIGH (ref 70–99)
Glucose-Capillary: 117 mg/dL — ABNORMAL HIGH (ref 70–99)
Glucose-Capillary: 211 mg/dL — ABNORMAL HIGH (ref 70–99)

## 2022-02-10 MED ORDER — SODIUM CHLORIDE 0.9 % IV SOLN: INTRAVENOUS | Status: DC

## 2022-02-10 MED ORDER — DILTIAZEM HCL ER COATED BEADS 240 MG PO CP24
240.0000 mg | ORAL_CAPSULE | Freq: Every day | ORAL | Status: DC
  Administered 2022-02-11 – 2022-02-12 (×2): 240 mg via ORAL
  Filled 2022-02-10 (×2): qty 1

## 2022-02-10 MED ORDER — OFF THE BEAT BOOK
Freq: Once | Status: AC
  Filled 2022-02-10: qty 1

## 2022-02-10 NOTE — Progress Notes (Signed)
Pt refused am lab draw. States "my arms are sore". Will continue to monitor. Jessie Foot, RN

## 2022-02-10 NOTE — Progress Notes (Signed)
Progress Note   Patient: Spencer English [id number]:[id number] DOB: 16-Jun-1966 DOA: 02/08/2022     2 DOS: the patient was seen and examined on 02/10/2022   Brief hospital course: Spencer English was admitted to the hospital with the working diagnosis of atrial fibrillation with rapid ventricular response.   56 yo male with the past medical history of paroxysmal atrial fibrillation, heart failure, HTN, T2DM, and hyperthyroid who presented with dyspnea. He reported palpitations and fluttering in his chest. For the last 2 months positive dyspnea on exertion and he has not being compliant with his medications. On his initial physical examination his blood pressure was 147/107 HR 133, RR 28, oxygen saturation 97%. His lungs were clear to auscultation, heart with S1 and S2 present, irregularly irregular, abdomen soft and positive lower extremity edema.   Na 138, K 3,7, CL 105, bicarbonate 24, glucose 211, bun 9 and cr 1,15 BNP 270 Wbc 4,3 hgb 14,2 hct 44, plt 210  Sars covid 19 negative  EKG 123 bpm, left axis deviation, atrial flutter rhythm with variable block, no significant ST segment or T wave changes.   Patient was placed on diltiazem drip for rate control and continue anticoagulation with rivaroxaban.   Successfully transitioned to oral diltiazem with good toleration, plan for electrical cardioversion in am per cardiology.   Assessment and Plan: * A-fib [facility])- (present on admission) Telemetry personally reviewed, patient continue in atrial flutter rhythm, typical flutter with rate of 70's  No dyspnea or chest pain.   Echocardiogram with preserved LV systolic function with EF 55 to 60% with preserved RV systolic function, mild dilatation of the aortic root, with no significant valvular disease.   Continue anticoagulation with rivaroxaban.  Cardiology has been consulted and plan for TEE and cardioversion tomorrow.   Essential hypertension- (present on admission) Continue blood pressure control with  metoprolol and losartan.  No added oral diltiazem with good toleration.   Chronic heart failure with preserved ejection fraction (HFpEF) ([place])- (present on admission) No clinical signs of decompensated heart failure. Plan to continue with metoprolol and diuresis with furosemide. For DC cardioversion tomorrow for atrial flutter.     Hyperthyroidism- (present on admission) On methimazole.  TSH on admission within normal limits.   Type 2 diabetes mellitus with hyperlipidemia ([place])- (present on admission) On empagliflozin and linagliptin.  Capillary glucose 117 today.   Continue with statin therapy.   CKD (chronic kidney disease) stage 2, GFR 60-89 ml/min Patient declined blood work this am, will follow renal function and electrolytes in am. Continue diuresis with furosemide.   Class 1 obesity- (present on admission) Calculated BMI is 33,5.   History of pulmonary embolus (PE)- (present on admission) On rivaroxaban for anticoagulation, possible non compliance.  Patient with chronic lower extremity edema.   Phlebitis Clinically improved.   Chronic anticoagulation Continue xarelto        Subjective: Patient with no dyspnea or chest pain, left arm pain has improved.   Physical Exam: Vitals:   02/09/22 2328 02/10/22 0501 02/10/22 0848 02/10/22 1231  BP:  (!) 137/98 (!) 150/97 113/82  Pulse: (!) 107 72 97 73  Resp: 18 18  17   Temp:  98.1 F (36.7 C)  98.4 F (36.9 C)  TempSrc:  Oral  Oral  SpO2: 95%   97%  Weight:  116.6 kg    Height:       Neurology awake and alert ENT with no pallor Cardiovascular with S1 and S2 present and regular with no gallops or  murmurs No JVD No lower extremity edema Respiratory with no wheezing or rales Abdomen soft and non tender   Data Reviewed:    Family Communication: no family at the bedside   Disposition: Status is: Inpatient Remains inpatient appropriate because: atrial flutter, for electrical cardioversion       Planned Discharge Destination: Home     Author: Tawni Millers, MD 02/10/2022 4:29 PM  For on call review [email or web address].

## 2022-02-10 NOTE — Progress Notes (Signed)
Heart Failure Navigator Progress Note  Assessed for Heart & Vascular TOC clinic readiness. No plans for HV TOC follow-up as EF WNL, SOB and admission appears related to AF.   Navigator available for reassessment of patient.   Pricilla Holm, MSN, RN Heart Failure Nurse Navigator [phone]

## 2022-02-10 NOTE — Progress Notes (Signed)
°  Transition of Care [facility]) Screening Note   Patient Details  Name: JD MCCASTER Date of Birth: [birth year]   Transition of Care [facility]) CM/SW Contact:    Milas Gain, [place] Phone Number: 02/10/2022, 4:41 PM    Transition of Care Department [facility]) has reviewed patient and no TOC needs have been identified at this time. We will continue to monitor patient advancement through interdisciplinary progression rounds. If new patient transition needs arise, please place a TOC consult.

## 2022-02-10 NOTE — Consult Note (Addendum)
Cardiology Consultation:   Patient ID: KMARION RAWL MRN: [id number]; DOB: 1966-05-24  Admit date: 02/08/2022 Date of Consult: 02/10/2022  PCP:  Lesleigh Noe, MD   [place] Prov[facility] Cardiologist:  Thompson Grayer, MD      Patient Profile:   Spencer English is a 56 y.o. 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 Cathlean Sauer.  History of Present Illness:   Spencer English is a 56 yo male with PMH noted above. He has been followed by Dr. Rayann Heman as an outpatient intermittently over the years. Hx of paroxsymal afib/flutter in the setting of hyperthyroid disease which was felt to have been resolved with treatment of thyroid. Also issues with ED felt to be related to BB. Notes indicate he has had issues with compliance regarding his anticoagulation in the past and was switched from coumadin to Xarelto. He has been seen by Joseph Art in the office several times, along with Cecille Rubin. Last visit was 07/2020 and reported doing well, was continued on home medications without change (Toprol 50mg  daily, verapamil 240mg  daily)   He reports intermittent episodes of palpitations and dyspnea since December. Says over the past couple of weeks these have been more prominent. 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 Has noticed increased dyspnea with climbing in and out of the truck. Had been smoking cigars and thought this was the cause, stopped with no improvement. Does have LE edema but reports this is unchanged. No chest pain.   Presented to the ED on 2/11 after getting up that morning to go to the bathroom. Felt light-headed, dizzy and thought he may pass out. Presented to the ED.  Labs in ED showed sodium 138, potassium 3.7, creatinine 1.1, BNP 270, high-sensitivity troponin 11>> 9, WBC 4.3, hemoglobin 14.2, TSH 0.5, hemoglobin A1c 6.7.  EKG showed atrial flutter, 123 bpm.  Chest x-ray  negative.  He was started on IV Cardizem and continued on home dose Xarelto.  Admitted to internal medicine for further management.  Echocardiogram 02/09/2022 showed LVEF of 55 to 60%, no regional wall motion abnormality, moderately elevated pulmonary artery pressure, no significant valvular disease.  He has been transitioned to Cardizem 60 mg every 6 hours as well as metoprolol succinate 50 mg daily.   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   GERD (gastroesophageal reflux disease)    GOUT 10/24/2010   Hyperglycemia    HYPERTENSION 07/21/2007   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   Leukopenia    Long term (current) use of anticoagulants 04/03/2011   PULMONARY EMBOLISM 05/03/2009   Sleep apnea    CPAP at bedtime   UNSPECIFIED URINARY CALCULUS     Past Surgical History:  Procedure Laterality Date   ANTERIOR CRUCIATE LIGAMENT REPAIR  2000   Right   CYSTECTOMY     back of head   KNEE ARTHROSCOPY     left knee   KNEE ARTHROSCOPY  01/03/2013   Procedure: ARTHROSCOPY KNEE;  Surgeon: Yvette Rack., MD;  Location: [facility];  Service: Orthopedics;  Laterality: Left;  Lavage Synovectomy, Removal  of loose body   KNEE ARTHROSCOPY W/ ACL RECONSTRUCTION     right knee     Home Medications:  Prior to Admission medications   Medication Sig Start Date End Date Taking? Authorizing Provider  atorvastatin (LIPITOR) 40 MG tablet Take 1 tablet (40 mg total) by mouth daily. 12/20/20  Yes Lesleigh Noe, MD  dapagliflozin propanediol (FARXIGA) 5 MG TABS tablet Take 1 tablet (5 mg total) by mouth daily. 11/01/20  Yes Lesleigh Noe, MD  furosemide (LASIX) 40 MG tablet TAKE 1 TABLET BY MOUTH EVERY DAY Patient taking differently: Take 40 mg by  mouth daily. 09/11/21  Yes Lesleigh Noe, MD  losartan (COZAAR) 100 MG tablet Take 1 tablet (100 mg total) by mouth daily. 11/01/20  Yes Lesleigh Noe, MD  methimazole (TAPAZOLE) 10 MG tablet TAKE 1 TABLET(10 MG) BY MOUTH DAILY Patient taking differently: Take 10 mg by mouth daily. 07/23/21  Yes Lesleigh Noe, MD  metoprolol succinate (TOPROL-XL) 50 MG 24 hr tablet Take 1 tablet (50 mg total) by mouth daily. 12/18/20  Yes Lesleigh Noe, MD  polyvinyl alcohol (LIQUIFILM TEARS) 1.4 % ophthalmic solution Place 1 drop into both eyes as needed for dry eyes.   Yes [provider]  rivaroxaban (XARELTO) 20 MG TABS tablet TAKE 1 TABLET(20 MG) BY MOUTH DAILY WITH SUPPER Patient taking differently: Take 20 mg by mouth every morning. 12/24/21 02/09/22 Yes Dugal, Tabitha, FNP  sitaGLIPtin (JANUVIA) 100 MG tablet Take 1 tablet (100 mg total) by mouth daily. 12/18/20  Yes Lesleigh Noe, MD  verapamil (CALAN-SR) 240 MG CR tablet Take 1 tablet (240 mg total) by mouth at bedtime. Patient taking differently: Take 240 mg by mouth every morning. 11/01/20  Yes Lesleigh Noe, MD  benzonatate (TESSALON) 100 MG capsule Take 1-2 capsules (100-200 mg total) by mouth 3 (three) times daily as needed. Patient not taking: Reported on 02/09/2022 10/08/21   Jaynee Eagles, PA-C  furosemide (LASIX) 40 MG tablet Take 1 tablet (40 mg total) by mouth daily. Patient not taking: Reported on 02/09/2022 12/24/21   Wynona Dove A, DO  glucose blood (CONTOUR NEXT TEST) test strip 1 each by Other route daily. And lancets 1/day 09/02/19   Renato Shin, MD  lidocaine (LIDODERM) 5 % Place 1 patch onto the skin daily. Remove & Discard patch within 12 hours or as directed by MD Patient not taking: Reported on 02/09/2022 12/11/21   Sherrill Raring, PA-C  meloxicam (MOBIC) 7.5 MG tablet Take 1 tablet (7.5 mg total) by mouth daily. Patient not taking: Reported on 02/09/2022 12/11/21   Sherrill Raring, PA-C  methimazole (TAPAZOLE) 10 MG tablet  Take 1 tablet (10 mg total) by mouth daily. Patient not taking: Reported on 02/09/2022 12/24/21 02/09/22  Jeanell Sparrow, DO  promethazine-dextromethorphan (PROMETHAZINE-DM) 6.25-15 MG/5ML syrup Take 5 mLs by mouth at bedtime as needed for cough. Patient not taking: Reported on 02/09/2022 10/08/21   Jaynee Eagles, PA-C    Inpatient Medications: Scheduled Meds:  atorvastatin  40 mg Oral Daily   dapagliflozin propanediol  5 mg Oral Daily   diltiazem  60 mg Oral Q6H   furosemide  40 mg Oral Daily   insulin aspart  0-9 Units Subcutaneous TID WC & HS   linagliptin  5 mg Oral Daily   losartan  100 mg Oral Daily   methimazole  10 mg Oral Daily   metoprolol succinate  50 mg Oral Daily   rivaroxaban  20 mg Oral Daily  sodium chloride flush  3 mL Intravenous Q12H   Continuous Infusions:  sodium chloride     PRN Meds: sodium chloride, acetaminophen **OR** acetaminophen, metoprolol tartrate, senna-docusate, sodium chloride flush  Allergies:    Allergies  Allergen Reactions   Metformin Diarrhea and Nausea Only   Viagra [Sildenafil Citrate] Other (See Comments)    headache    Social History:   Social History   Socioeconomic History   Marital status: Married    Spouse name: Sharyn Lull   Number of children: 3   Years of education: high school   Highest education level: Not on file  Occupational History   Occupation: Truck Education administrator: [employer]    Employer: [employer]  Tobacco Use   Smoking status: Some Days    Types: Cigars   Smokeless tobacco: Never  Vaping Use   Vaping Use: Never used  Substance and Sexual Activity   Alcohol use: Yes    Comment: weekends   Drug use: No   Sexual activity: Not on file  Other Topics Concern   Not on file  Social History Narrative   11/01/20   From: the area   Living: with wife, Sharyn Lull (1990)   Work: truck Geophysicist/field seismologist - concrete      Family: 3 adult children - [place], [place], Jonnie Kind      Enjoys: play  basketball, fish, spend time with family      Exercise: stationary bike - rare use   Diet: grilled and baked food      Safety   Seat belts: Yes    Guns: No   Safe in relationships: Yes    Social Determinants of Radio broadcast assistant Strain: Not on file  Food Insecurity: Not on file  Transportation Needs: Not on file  Physical Activity: Not on file  Stress: Not on file  Social Connections: Not on file  Intimate Partner Violence: Not on file    Family History:    Family History  Problem Relation Age of Onset   Diabetes Mother    Hypertension Mother    Hyperlipidemia Mother    Dementia Mother    Diabetes Father    Hypertension Father    Hyperlipidemia Father    Heart attack Father    Heart disease Father    Prostate cancer Father 5   Stomach cancer Neg Hx    Colon cancer Neg Hx    Pancreatic cancer Neg Hx    Esophageal cancer Neg Hx    Rectal cancer Neg Hx      ROS:  Please see the history of present illness.   All other ROS reviewed and negative.     Physical Exam/Data:   Vitals:   02/09/22 2328 02/10/22 0501 02/10/22 0848 02/10/22 1231  BP:  (!) 137/98 (!) 150/97 113/82  Pulse: (!) 107 72 97   Resp: 18 18    Temp:  98.1 F (36.7 C)    TempSrc:  Oral    SpO2: 95%     Weight:  116.6 kg    Height:        Intake/Output Summary (Last 24 hours) at 02/10/2022 1419 Last data filed at 02/10/2022 0849 Gross per 24 hour  Intake 890.83 ml  Output 900 ml  Net -9.17 ml   Last 3 Weights 02/10/2022 02/09/2022 02/08/2022  Weight (lbs) 257 lb 1.6 oz 247 lb 3.2 oz 248 lb 9.6 oz  Weight (kg) 116.62 kg 112.129 kg 112.764 kg  Body mass index is 34.87 kg/m.  General:  Well nourished, well developed, in no acute distress HEENT: normal Neck: no JVD Vascular: No carotid bruits; Distal pulses 2+ bilaterally Cardiac:  normal S1, S2; RRR; no murmur  Lungs:  clear to auscultation bilaterally, no wheezing, rhonchi or rales  Abd: soft, nontender, no hepatomegaly   Ext: no edema Musculoskeletal:  No deformities, BUE and BLE strength normal and equal Skin: warm and dry  Neuro:  CNs 2-12 intact, no focal abnormalities noted Psych:  Normal affect   EKG:  The EKG was personally reviewed and demonstrates:  Atrial flutter 123 bpm Telemetry:  Telemetry was personally reviewed and demonstrates:  Atrial flutter, PVCs rates 60-140s (brief spikes)  Relevant CV Studies:  Echo: 02/09/22  IMPRESSIONS     1. Left ventricular ejection fraction, by estimation, is 55 to 60%. The  left ventricle has normal function. The left ventricle has no regional  wall motion abnormalities. Left ventricular diastolic function could not  be evaluated.   2. Right ventricular systolic function is normal. The right ventricular  size is normal. There is moderately elevated pulmonary artery systolic  pressure. The estimated right ventricular systolic pressure is 16.1 mmHg.   3. The mitral valve is normal in structure. Mild mitral valve  regurgitation. No evidence of mitral stenosis.   4. The aortic valve is tricuspid. Aortic valve regurgitation is trivial.  Aortic valve sclerosis is present, with no evidence of aortic valve  stenosis. Aortic valve area, by VTI measures 2.32 cm. Aortic valve mean  gradient measures 3.0 mmHg. Aortic  valve Vmax measures 1.25 m/s.   5. Aortic dilatation noted. There is mild dilatation of the aortic root,  measuring 39 mm.   6. The inferior vena cava is normal in size with greater than 50%  respiratory variability, suggesting right atrial pressure of 3 mmHg.   FINDINGS   Left Ventricle: Left ventricular ejection fraction, by estimation, is 55  to 60%. The left ventricle has normal function. The left ventricle has no  regional wall motion abnormalities. The left ventricular internal cavity  size was normal in size. There is   no left ventricular hypertrophy. Left ventricular diastolic function  could not be evaluated due to atrial  fibrillation. Left ventricular  diastolic function could not be evaluated.   Right Ventricle: The right ventricular size is normal. No increase in  right ventricular wall thickness. Right ventricular systolic function is  normal. There is moderately elevated pulmonary artery systolic pressure.  The tricuspid regurgitant velocity is  2.86 m/s, and with an assumed right atrial pressure of 15 mmHg, the  estimated right ventricular systolic pressure is 09.6 mmHg.   Left Atrium: Left atrial size was normal in size.   Right Atrium: Right atrial size was normal in size.   Pericardium: There is no evidence of pericardial effusion.   Mitral Valve: The mitral valve is normal in structure. Mild mitral annular  calcification. Mild mitral valve regurgitation. No evidence of mitral  valve stenosis.   Tricuspid Valve: The tricuspid valve is normal in structure. Tricuspid  valve regurgitation is mild . No evidence of tricuspid stenosis.   Aortic Valve: The aortic valve is tricuspid. Aortic valve regurgitation is  trivial. Aortic valve sclerosis is present, with no evidence of aortic  valve stenosis. Aortic valve mean gradient measures 3.0 mmHg. Aortic valve  peak gradient measures 6.2 mmHg.  Aortic valve area, by VTI measures 2.32 cm.   Pulmonic Valve: The pulmonic valve was normal  in structure. Pulmonic valve  regurgitation is not visualized. No evidence of pulmonic stenosis.   Aorta: Aortic dilatation noted. There is mild dilatation of the aortic  root, measuring 39 mm.   Venous: The inferior vena cava is normal in size with greater than 50%  respiratory variability, suggesting right atrial pressure of 3 mmHg.   IAS/Shunts: No atrial level shunt detected by color flow Doppler.   Laboratory Data:  High Sensitivity Troponin:   Recent Labs  Lab 02/08/22 1157 02/08/22 1646  TROPONINIHS 11 9     Chemistry Recent Labs  Lab 02/08/22 1157 02/09/22 0311  NA 138 139  K 3.7 4.4  CL  105 108  CO2 24 20*  GLUCOSE 211* 128*  BUN 9 7  CREATININE 1.15 1.25*  CALCIUM 8.7* 8.5*  GFRNONAA >60 >60  ANIONGAP 9 11    No results for input(s): PROT, ALBUMIN, AST, ALT, ALKPHOS, BILITOT in the last 168 hours. Lipids No results for input(s): CHOL, TRIG, HDL, LABVLDL, LDLCALC, CHOLHDL in the last 168 hours.  Hematology Recent Labs  Lab 02/08/22 1157 02/09/22 0311  WBC 4.3 5.3  RBC 5.39 5.24  HGB 14.2 14.2  HCT 44.2 43.5  MCV 82.0 83.0  MCH 26.3 27.1  MCHC 32.1 32.6  RDW 16.2* 16.3*  PLT 210 201   Thyroid  Recent Labs  Lab 02/08/22 1247  TSH 0.523    BNP Recent Labs  Lab 02/08/22 1157  BNP 270.2*    DDimer No results for input(s): DDIMER in the last 168 hours.   Radiology/Studies:  DG Chest 2 View  Result Date: 02/08/2022 CLINICAL DATA:  56 year old male with history of shortness of breath. EXAM: CHEST - 2 VIEW COMPARISON:  Chest x-ray 05/07/2021. FINDINGS: Lung volumes are normal. No consolidative airspace disease. No pleural effusions. No pneumothorax. No pulmonary nodule or mass noted. Pulmonary vasculature and the cardiomediastinal silhouette are within normal limits. IMPRESSION: No radiographic evidence of acute cardiopulmonary disease. Electronically Signed   By: Vinnie Langton M.D.   On: 02/08/2022 13:20   ECHOCARDIOGRAM COMPLETE  Result Date: 02/09/2022    ECHOCARDIOGRAM REPORT   Patient Name:   HYDEN SOLEY Date of Exam: 02/09/2022 Medical Rec #:  [id number]      Height:       72.0 in Accession #:    [id number]     Weight:       247.2 lb Date of Birth:  [birth year]       BSA:          2.331 m Patient Age:    40 years       BP:           129/90 mmHg Patient Gender: M              HR:           72 bpm. Exam Location:  Inpatient Procedure: 2D Echo Indications:    Acute diastolic CHF  History:        Patient has prior history of Echocardiogram examinations, most                 recent 10/14/2018. Arrythmias:Atrial Fibrillation; Risk                  Factors:Diabetes, Dyslipidemia and Hypertension.  Sonographer:    Arlyss Gandy Referring Phys: [id number] [place]  1. Left ventricular ejection fraction, by estimation, is 55 to 60%. The left ventricle has normal function. The left ventricle  has no regional wall motion abnormalities. Left ventricular diastolic function could not be evaluated.  2. Right ventricular systolic function is normal. The right ventricular size is normal. There is moderately elevated pulmonary artery systolic pressure. The estimated right ventricular systolic pressure is 79.3 mmHg.  3. The mitral valve is normal in structure. Mild mitral valve regurgitation. No evidence of mitral stenosis.  4. The aortic valve is tricuspid. Aortic valve regurgitation is trivial. Aortic valve sclerosis is present, with no evidence of aortic valve stenosis. Aortic valve area, by VTI measures 2.32 cm. Aortic valve mean gradient measures 3.0 mmHg. Aortic valve Vmax measures 1.25 m/s.  5. Aortic dilatation noted. There is mild dilatation of the aortic root, measuring 39 mm.  6. The inferior vena cava is normal in size with greater than 50% respiratory variability, suggesting right atrial pressure of 3 mmHg. FINDINGS  Left Ventricle: Left ventricular ejection fraction, by estimation, is 55 to 60%. The left ventricle has normal function. The left ventricle has no regional wall motion abnormalities. The left ventricular internal cavity size was normal in size. There is  no left ventricular hypertrophy. Left ventricular diastolic function could not be evaluated due to atrial fibrillation. Left ventricular diastolic function could not be evaluated. Right Ventricle: The right ventricular size is normal. No increase in right ventricular wall thickness. Right ventricular systolic function is normal. There is moderately elevated pulmonary artery systolic pressure. The tricuspid regurgitant velocity is 2.86 m/s, and with an assumed right atrial  pressure of 15 mmHg, the estimated right ventricular systolic pressure is 90.3 mmHg. Left Atrium: Left atrial size was normal in size. Right Atrium: Right atrial size was normal in size. Pericardium: There is no evidence of pericardial effusion. Mitral Valve: The mitral valve is normal in structure. Mild mitral annular calcification. Mild mitral valve regurgitation. No evidence of mitral valve stenosis. Tricuspid Valve: The tricuspid valve is normal in structure. Tricuspid valve regurgitation is mild . No evidence of tricuspid stenosis. Aortic Valve: The aortic valve is tricuspid. Aortic valve regurgitation is trivial. Aortic valve sclerosis is present, with no evidence of aortic valve stenosis. Aortic valve mean gradient measures 3.0 mmHg. Aortic valve peak gradient measures 6.2 mmHg. Aortic valve area, by VTI measures 2.32 cm. Pulmonic Valve: The pulmonic valve was normal in structure. Pulmonic valve regurgitation is not visualized. No evidence of pulmonic stenosis. Aorta: Aortic dilatation noted. There is mild dilatation of the aortic root, measuring 39 mm. Venous: The inferior vena cava is normal in size with greater than 50% respiratory variability, suggesting right atrial pressure of 3 mmHg. IAS/Shunts: No atrial level shunt detected by color flow Doppler.  LEFT VENTRICLE PLAX 2D LVIDd:         5.30 cm   Diastology LVIDs:         4.40 cm   LV e' medial:    8.05 cm/s LV PW:         0.90 cm   LV E/e' medial:  13.4 LV IVS:        0.80 cm   LV e' lateral:   9.03 cm/s LVOT diam:     2.00 cm   LV E/e' lateral: 12.0 LV SV:         48 LV SV Index:   21 LVOT Area:     3.14 cm  RIGHT VENTRICLE             IVC RV Basal diam:  3.90 cm     IVC diam: 2.30 cm RV  Mid diam:    3.40 cm RV S prime:     11.30 cm/s TAPSE (M-mode): 1.8 cm LEFT ATRIUM             Index        RIGHT ATRIUM           Index LA diam:        4.50 cm 1.93 cm/m   RA Area:     19.50 cm LA Vol (A2C):   76.8 ml 32.94 ml/m  RA Volume:   54.10 ml  23.21  ml/m LA Vol (A4C):   73.4 ml 31.48 ml/m LA Biplane Vol: 77.3 ml 33.16 ml/m  AORTIC VALVE AV Area (Vmax):    2.28 cm AV Area (Vmean):   2.28 cm AV Area (VTI):     2.32 cm AV Vmax:           125.00 cm/s AV Vmean:          85.300 cm/s AV VTI:            0.207 m AV Peak Grad:      6.2 mmHg AV Mean Grad:      3.0 mmHg LVOT Vmax:         90.70 cm/s LVOT Vmean:        62.000 cm/s LVOT VTI:          0.153 m LVOT/AV VTI ratio: 0.74  AORTA Ao Root diam: 3.90 cm Ao Asc diam:  3.50 cm MITRAL VALVE                TRICUSPID VALVE MV Area (PHT): 4.36 cm     TR Peak grad:   32.7 mmHg MV Decel Time: 174 msec     TR Vmax:        286.00 cm/s MV E velocity: 108.00 cm/s                             SHUNTS                             Systemic VTI:  0.15 m                             Systemic Diam: 2.00 cm Fransico Him MD Electronically signed by Fransico Him MD Signature Date/Time: 02/09/2022/3:03:09 PM    Final      Assessment and Plan:   JOAQUIN KNEBEL is a 56 y.o. 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 Cathlean Sauer.  Atrial Flutter: hx of same. Previously on coumadin, then transitioned to Xarelto. Has had intermittent episodes of palpitations for the past couple of months. Initially rates elevated on admission, placed on IV Diltiazem. Now transitioned to oral Dilt 60mg  q6hr along with Toprol XL 50mg  daily. Rates are still variable. He does report missing at least one dose of Xarelto last week. Would benefit from TEE/DCCV, able to schedule for 2pm tomorrow. -- NPO at midnight   -- continue Dilt, consolidate tomorrow -- continue Toprol XL 50mg  daily, Xarelto  -- likely refer to EP as an outpatient for ablation -- Afib clinic follow up arranged  HTN: stable -- continue on Toprol XL 50mg  daily -- consolidate Dilt to 240mg  daily  tomorrow as rates are in the 60-70 range  currently  OSA: reports compliance with Cpap  Hyperthyroidism: on tapazole  -- TSH 0.5  DM: Hgb A1c 6.8 -- on januvia, farxiga PTA  Risk Assessment/Risk Scores:   CHA2DS2-VASc Score = 3  This indicates a 3.2% annual risk of stroke. The patient's score is based upon: CHF History: 1 HTN History: 1 Diabetes History: 1 Stroke History: 0 Vascular Disease History: 0 Age Score: 0 Gender Score: 0  For questions or updates, please contact [place] Please consult [email or web address] for contact info under    Signed, Reino Bellis, NP  02/10/2022 2:19 PM  I have seen and examined the patient along with Reino Bellis, NP .  I have reviewed the chart, notes and new data.  I agree with PA/NP's note.  Key new complaints: Asymptomatic at rest.  Has developed palpitations off and on for months, maybe even years.  Has developed exertional dyspnea over the last few weeks.  Denies chest pain. Key examination changes: Irregular rhythm interspersed with periods of very regular rhythm, otherwise normal cardiovascular exam.  Doing physical activity the heart rate increases to the 120-1 130s. Key new findings / data: ECG shows typical counterclockwise atrial flutter with variable AV block.  I have reviewed as many of his tracings as I could find over the last few years.  None of them show atrial fibrillation.  They show either sinus rhythm or typical counterclockwise atrial flutter.  He had a monitor that he wore in 2013 that purportedly showed atrial fibrillation, but the quality of the tracing is poor due to baseline artifact and I suspect that he has always just had atrial flutter.  Initial presentation in 2013 with arrhythmia was during thyrotoxicosis.  He is currently euthyroid on methimazole with a TSH in normal range.  PLAN: He will benefit from cardioversion. Since he has had imperfect compliance with Xarelto, will need to first perform a TEE. In the long run, he will be best served by  cavotricuspid isthmus ablation.  This will not remove the need to take anticoagulants for DVT prophylaxis, but the dose of Xarelto could be decreased. Currently scheduled for TEE guided cardioversion tomorrow at 1400 hrs.  Briefly discussed with Dr. [name], consideration to proceed directly to ablation instead of cardioversion tomorrow, after appropriate consultation with the patient.  Sanda Klein, MD, [place] [phone] 02/10/2022, 3:39 PM

## 2022-02-10 NOTE — Consult Note (Addendum)
Cardiology Consultation:   Patient ID: Spencer English MRN: [id number]; DOB: 1966/06/16  Admit date: 02/08/2022 Date of Consult: 02/10/2022  PCP:  Spencer English   [place] [facility] Cardiologist:  Spencer Grayer, English   {   Patient Profile:   Spencer English is a 56 y.o. male with a hx of  PAFib/flutter in setting of hypothyroidism, felt to be resolved with treatment of his thyroid at his last visit with Dr. Rayann English in June 2015, HTN, HLD, hx of DVT/PE in 2010, DM, OSA w/ CPAP who is being seen 02/10/2022 for the evaluation of AFlutter at the request of Dr. Sallyanne English.  History of Present Illness:   Spencer English last saw EP/cardiology Aug 2021 by myself, for pre-colonoscopy clearance, he felt well, working, busy.  No symptoms of arrhythmia  He was admitted to [facility] 02/08/22 feeling lightheaded, , near syncope,  and palpitations, found in AFlutter w/FVR 120's, started on dilt gtt Labs largely unremarkable, HS Trop neg, BNP mildly elevated, TSH wnl  EP is asked to weigh in rhythm management options, planned for TEE/DCCV with a dose of xarelto missed last week, and c/o some palpitations intermittently for a couple months  LABS K+ 3.9 BUN/Creat 14/1.33 WBC 5.3 H/H 14/43 Plts 201  TSH 0.523  Past Medical History:  Diagnosis Date   ALLERGIC RHINITIS 04/12/2009   Allergy    Anxiety    Arthritis    Atrial fibrillation ([place])    BACK PAIN, LUMBAR 01/14/2011   DEEP VENOUS THROMBOPHLEBITIS, LEG, LEFT 05/05/2009   DM 08/08/2008   DYSLIPIDEMIA 04/26/2009   ECZEMA 05/23/2010   ERECTILE DYSFUNCTION, 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   GERD (gastroesophageal reflux disease)    GOUT 10/24/2010   Hyperglycemia    HYPERTENSION 07/21/2007   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   Leukopenia    Long term (current) use of anticoagulants 04/03/2011   PULMONARY EMBOLISM 05/03/2009   Sleep apnea    CPAP at bedtime   UNSPECIFIED URINARY CALCULUS     Past Surgical History:  Procedure Laterality Date   ANTERIOR CRUCIATE LIGAMENT  REPAIR  2000   Right   CYSTECTOMY     back of head   KNEE ARTHROSCOPY     left knee   KNEE ARTHROSCOPY  01/03/2013   Procedure: ARTHROSCOPY KNEE;  Surgeon: Spencer English., English;  Location: [place];  Service: Orthopedics;  Laterality: Left;  Lavage Synovectomy, Removal of loose body   KNEE ARTHROSCOPY W/ ACL RECONSTRUCTION     right knee     Home Medications:  Prior to Admission medications   Medication Sig Start Date End Date Taking? Authorizing Provider  atorvastatin (LIPITOR) 40 MG tablet Take 1 tablet (40 mg total) by mouth daily. 12/20/20  Yes Spencer English  dapagliflozin propanediol (FARXIGA) 5 MG TABS tablet Take 1 tablet (5 mg total) by mouth daily. 11/01/20  Yes Spencer English  furosemide (LASIX) 40 MG tablet TAKE 1 TABLET BY MOUTH EVERY DAY Patient taking differently: Take 40 mg by mouth daily. 09/11/21  Yes Spencer English  losartan (COZAAR) 100 MG tablet Take 1 tablet (100 mg total) by mouth daily. 11/01/20  Yes Spencer English  methimazole (TAPAZOLE) 10 MG tablet TAKE 1 TABLET(10 MG) BY MOUTH DAILY Patient taking differently: Take 10 mg by mouth daily. 07/23/21  Yes Spencer English  metoprolol succinate (TOPROL-XL) 50 MG 24 hr tablet Take 1 tablet (50 mg total) by mouth daily. 12/18/20  Yes Spencer Schooner  R, English  polyvinyl alcohol (LIQUIFILM TEARS) 1.4 % ophthalmic solution Place 1 drop into both eyes as needed for dry eyes.   Yes Provider, Historical, English  rivaroxaban (XARELTO) 20 MG TABS tablet TAKE 1 TABLET(20 MG) BY MOUTH DAILY WITH SUPPER Patient taking differently: Take 20 mg by mouth every morning. 12/24/21 02/09/22 Yes Dugal, Tabitha, FNP  sitaGLIPtin (JANUVIA) 100 MG tablet Take 1 tablet (100 mg total) by mouth daily. 12/18/20  Yes Spencer English  verapamil (CALAN-SR) 240 MG CR tablet Take 1 tablet (240 mg total) by mouth at bedtime. Patient taking differently: Take 240 mg by mouth every morning. 11/01/20  Yes Spencer English  benzonatate (TESSALON)  100 MG capsule Take 1-2 capsules (100-200 mg total) by mouth 3 (three) times daily as needed. Patient not taking: Reported on 02/09/2022 10/08/21   Spencer English  furosemide (LASIX) 40 MG tablet Take 1 tablet (40 mg total) by mouth daily. Patient not taking: Reported on 02/09/2022 12/24/21   Spencer English A, DO  glucose blood (CONTOUR NEXT TEST) test strip 1 each by Other route daily. And lancets 1/day 09/02/19   Spencer Shin, English  lidocaine (LIDODERM) 5 % Place 1 patch onto the skin daily. Remove & Discard patch within 12 hours or as directed by English Patient not taking: Reported on 02/09/2022 12/11/21   Spencer Raring, English  meloxicam (MOBIC) 7.5 MG tablet Take 1 tablet (7.5 mg total) by mouth daily. Patient not taking: Reported on 02/09/2022 12/11/21   Spencer Raring, English  methimazole (TAPAZOLE) 10 MG tablet Take 1 tablet (10 mg total) by mouth daily. Patient not taking: Reported on 02/09/2022 12/24/21 02/09/22  Spencer Sparrow, DO  promethazine-dextromethorphan (PROMETHAZINE-DM) 6.25-15 MG/5ML syrup Take 5 mLs by mouth at bedtime as needed for cough. Patient not taking: Reported on 02/09/2022 10/08/21   Spencer English    Inpatient Medications: Scheduled Meds:  atorvastatin  40 mg Oral Daily   dapagliflozin propanediol  5 mg Oral Daily   [START ON 02/11/2022] diltiazem  240 mg Oral Daily   diltiazem  60 mg Oral Q6H   furosemide  40 mg Oral Daily   insulin aspart  0-9 Units Subcutaneous TID WC & HS   linagliptin  5 mg Oral Daily   losartan  100 mg Oral Daily   methimazole  10 mg Oral Daily   metoprolol succinate  50 mg Oral Daily   rivaroxaban  20 mg Oral Daily   sodium chloride flush  3 mL Intravenous Q12H   Continuous Infusions:  sodium chloride     sodium chloride     PRN Meds: sodium chloride, acetaminophen **OR** acetaminophen, metoprolol tartrate, senna-docusate, sodium chloride flush  Allergies:    Allergies  Allergen Reactions   Metformin Diarrhea and Nausea Only   Viagra  [Sildenafil Citrate] Other (See Comments)    headache    Social History:   Social History   Socioeconomic History   Marital status: Married    Spouse name: Sharyn English   Number of children: 3   Years of education: high school   Highest education level: Not on file  Occupational History   Occupation: Truck Education administrator: [employer]    Employer: [employer]  Tobacco Use   Smoking status: Some Days    Types: Cigars   Smokeless tobacco: Never  Vaping Use   Vaping Use: Never used  Substance and Sexual Activity   Alcohol use: Yes  Comment: weekends   Drug use: No   Sexual activity: Not on file  Other Topics Concern   Not on file  Social History Narrative   11/01/20   From: the area   Living: with wife, Sharyn English [phone])   Work: truck Geophysicist/field seismologist - concrete      Family: 3 adult children - [place], Junior, Jonnie Kind      Enjoys: play basketball, fish, spend time with family      Exercise: stationary bike - rare use   Diet: grilled and baked food      Safety   Seat belts: Yes    Guns: No   Safe in relationships: Yes    Social Determinants of Radio broadcast assistant Strain: Not on file  Food Insecurity: Not on file  Transportation Needs: Not on file  Physical Activity: Not on file  Stress: Not on file  Social Connections: Not on file  Intimate Partner Violence: Not on file    Family History:   Family History  Problem Relation Age of Onset   Diabetes Mother    Hypertension Mother    Hyperlipidemia Mother    Dementia Mother    Diabetes Father    Hypertension Father    Hyperlipidemia Father    Heart attack Father    Heart disease Father    Prostate cancer Father 40   Stomach cancer Neg Hx    Colon cancer Neg Hx    Pancreatic cancer Neg Hx    Esophageal cancer Neg Hx    Rectal cancer Neg Hx      ROS:  Please see the history of present illness.  All other ROS reviewed and negative.     Physical Exam/Data:   Vitals:   02/09/22  2328 02/10/22 0501 02/10/22 0848 02/10/22 1231  BP:  (!) 137/98 (!) 150/97 113/82  Pulse: (!) 107 72 97   Resp: 18 18  17   Temp:  98.1 F (36.7 C)    TempSrc:  Oral    SpO2: 95%     Weight:  116.6 kg    Height:        Intake/Output Summary (Last 24 hours) at 02/10/2022 1540 Last data filed at 02/10/2022 1200 Gross per 24 hour  Intake 1083 ml  Output 600 ml  Net 483 ml   Last 3 Weights 02/10/2022 02/09/2022 02/08/2022  Weight (lbs) 257 lb 1.6 oz 247 lb 3.2 oz 248 lb 9.6 oz  Weight (kg) 116.62 kg 112.129 kg 112.764 kg     Body mass index is 34.87 kg/m.  General:  Well nourished, well developed, in no acute distress HEENT: normal Neck: no JVD Vascular: No carotid bruits Cardiac:  irreg-irreg; no murmurs, gallops or rubs Lungs:  CTA b/l, no wheezing, rhonchi or rales  Abd: soft, nontender  Ext: no edema Musculoskeletal:  No deformities Skin: warm and dry  Neuro:  no focal abnormalities noted Psych:  Normal affect   EKG:  The EKG was personally reviewed and demonstrates:   AFlutter (typical) 123bpm AFlutter 103bpm, PVCs  Telemetry:  Telemetry was personally reviewed and demonstrates:   AFlutter 80's-120's, occ PVCs  Relevant CV Studies:  02/09/22: TTE  1. Left ventricular ejection fraction, by estimation, is 55 to 60%. The  left ventricle has normal function. The left ventricle has no regional  wall motion abnormalities. Left ventricular diastolic function could not  be evaluated.   2. Right ventricular systolic function is normal. The right ventricular  size is normal. There is  moderately elevated pulmonary artery systolic  pressure. The estimated right ventricular systolic pressure is 76.2 mmHg.   3. The mitral valve is normal in structure. Mild mitral valve  regurgitation. No evidence of mitral stenosis.   4. The aortic valve is tricuspid. Aortic valve regurgitation is trivial.  Aortic valve sclerosis is present, with no evidence of aortic valve  stenosis. Aortic  valve area, by VTI measures 2.32 cm. Aortic valve mean  gradient measures 3.0 mmHg. Aortic  valve Vmax measures 1.25 m/s.   5. Aortic dilatation noted. There is mild dilatation of the aortic root,  measuring 39 mm.   6. The inferior vena cava is normal in size with greater than 50%  respiratory variability, suggesting right atrial pressure of 3 mmHg.    10/14/2018: TTE Study Conclusions  - Left ventricle: The cavity size was normal. Systolic function was    normal. The estimated ejection fraction was in the range of 55%    to 60%. Wall motion was normal; there were no regional wall    motion abnormalities. Doppler parameters are consistent with    abnormal left ventricular relaxation (grade 1 diastolic    dysfunction). Doppler parameters are consistent with elevated    mean left atrial filling pressure.  - Aortic valve: There was trivial regurgitation.  - Left atrium: The atrium was mildly dilated.      07/08/16: TTE Study Conclusions - Left ventricle: The cavity size was normal. Systolic function was   normal. The estimated ejection fraction was in the range of 55%   to 60%. Wall motion was normal; there were no regional wall   motion abnormalities. There was an increased relative   contribution of atrial contraction to ventricular filling.   Doppler parameters are consistent with abnormal left ventricular   relaxation (grade 1 diastolic dysfunction). - Aortic valve: Trileaflet; normal thickness, mildly calcified   leaflets. - Aorta: Aortic root dimension: 42 mm (ED). - Aortic root: The aortic root was mildly dilated. - Mitral valve: There was trivial regurgitation. - Atrial septum: A patent foramen ovale cannot be excluded. - Tricuspid valve: There was mild regurgitation. - Recommendations: Agitated saline contrast study to rule out PFO. Recommendations:  Agitated saline contrast study to rule out PFO.   07/09/16: stress myoview Nuclear stress EF: 51%. There was no ST  segment deviation noted during stress. The study is normal. This is a low risk study. The left ventricular ejection fraction is mildly decreased (45-54%). Normal exercise nuclear stress test with no evidence of prior infarct or ischemia.  LVEF calculated at 51% but visually appears better.    01/24/13: Echocardiogram Study Conclusions - Left ventricle: The cavity size was moderately dilated.   Wall thickness was normal. Systolic function was normal.   The estimated ejection fraction was in the range of 50% to   55%. - Mitral valve: Moderate regurgitation. - Left atrium: The atrium was moderately dilated. - Right ventricle: The cavity size was moderately dilated.   Systolic function was mildly reduced. - Right atrium: The atrium was moderately dilated. - Tricuspid valve: Moderate regurgitation. - Pulmonary arteries: Systolic pressure was mildly to   moderately increased. PA peak pressure: 39mm Hg (S).    Laboratory Data:  High Sensitivity Troponin:   Recent Labs  Lab 02/08/22 1157 02/08/22 1646  TROPONINIHS 11 9     Chemistry Recent Labs  Lab 02/08/22 1157 02/09/22 0311  NA 138 139  K 3.7 4.4  CL 105 108  CO2 24 20*  GLUCOSE 211* 128*  BUN 9 7  CREATININE 1.15 1.25*  CALCIUM 8.7* 8.5*  GFRNONAA >60 >60  ANIONGAP 9 11    No results for input(s): PROT, ALBUMIN, AST, ALT, ALKPHOS, BILITOT in the last 168 hours. Lipids No results for input(s): CHOL, TRIG, HDL, LABVLDL, LDLCALC, CHOLHDL in the last 168 hours.  Hematology Recent Labs  Lab 02/08/22 1157 02/09/22 0311  WBC 4.3 5.3  RBC 5.39 5.24  HGB 14.2 14.2  HCT 44.2 43.5  MCV 82.0 83.0  MCH 26.3 27.1  MCHC 32.1 32.6  RDW 16.2* 16.3*  PLT 210 201   Thyroid  Recent Labs  Lab 02/08/22 1247  TSH 0.523    BNP Recent Labs  Lab 02/08/22 1157  BNP 270.2*    DDimer No results for input(s): DDIMER in the last 168 hours.   Radiology/Studies:  DG Chest 2 View Result Date: 02/08/2022 CLINICAL DATA:   56 year old male with history of shortness of breath. EXAM: CHEST - 2 VIEW COMPARISON:  Chest x-ray 05/07/2021. FINDINGS: Lung volumes are normal. No consolidative airspace disease. No pleural effusions. No pneumothorax. No pulmonary nodule or mass noted. Pulmonary vasculature and the cardiomediastinal silhouette are within normal limits. IMPRESSION: No radiographic evidence of acute cardiopulmonary disease. Electronically Signed   By: Vinnie Langton M.D.   On: 02/08/2022 13:20        Assessment and Plan:    1. PAFib, in the setting of hyperthyroism     CHA2DS2Vasc is at least 2, on  Xarelto for h/o DVT as well, appropriately dosed     TSH is wnl Dr. Lovena Le has seen and examined the patient Discussed management options, TEE/DCCV vs TEE/EPS/ablation Patient would like to pursue ablation     2. 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 VHD, mod MR      No significant VHD by last echo, outpt or here  Risk Assessment/Risk Scores:    For questions or updates, please contact [place] Please consult [email or web address] for contact info under    Signed, Baldwin [place], English  02/10/2022 3:40 PM  EP Attending  Patient seen and examined. Agree with the findings as noted above. The patient is a pleasant 56 yo man with HTN, h/o DVT, and atrial flutter who presents with palpitations and worsening sob and atrial flutter with a RVR. He admits to some non-compliance with his [place]. He has sob and fatigue. He denies syncope. He has been found to have atrial flutter with a CVR/RVR. His exam is notable for a well appearing middle aged man, NAD. Lungs are clear and CV reveals an ireg tachy. Ext without edema. ECG - typical atrial flutter.  A/P Typical atrial flutter - I have discussed the treatment options with the patient and the risks/benefits/goals/expectations of EP study and ablation were reviewed and he  wishes to proceed.  Carleene Overlie Eula Jaster,English

## 2022-02-10 NOTE — H&P (View-Only) (Signed)
Cardiology Consultation:   Patient ID: GREYSEN DEVINO MRN: [id number]; DOB: [birth year]  Admit date: 02/08/2022 Date of Consult: 02/10/2022  PCP:  Lesleigh Noe, MD   [place] Providers Cardiologist:  Thompson Grayer, MD   {   Patient Profile:   MADIX BLOWE is a 56 y.o. male with a hx of  PAFib/flutter in setting of hypothyroidism, felt to be resolved with treatment of his thyroid at his last visit with Dr. Rayann Heman in June 2015, HTN, HLD, hx of DVT/PE in 2010, DM, OSA w/ CPAP who is being seen 02/10/2022 for the evaluation of AFlutter at the request of Dr. Sallyanne Kuster.  History of Present Illness:   Mr. Smalls last saw EP/cardiology Aug 2021 by myself, for pre-colonoscopy clearance, he felt well, working, busy.  No symptoms of arrhythmia  He was admitted to [facility] 02/08/22 feeling lightheaded, , near syncope,  and palpitations, found in AFlutter w/FVR 120's, started on dilt gtt Labs largely unremarkable, HS Trop neg, BNP mildly elevated, TSH wnl  EP is asked to weigh in rhythm management options, planned for TEE/DCCV with a dose of xarelto missed last week, and c/o some palpitations intermittently for a couple months  LABS K+ 3.9 BUN/Creat 14/1.33 WBC 5.3 H/H 14/43 Plts 201  TSH 0.523  Past Medical History:  Diagnosis Date   ALLERGIC RHINITIS 04/12/2009   Allergy    Anxiety    Arthritis    Atrial fibrillation ([place])    BACK PAIN, LUMBAR 01/14/2011   DEEP VENOUS THROMBOPHLEBITIS, LEG, LEFT 05/05/2009   DM 08/08/2008   DYSLIPIDEMIA 04/26/2009   ECZEMA 05/23/2010   ERECTILE DYSFUNCTION, 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   GERD (gastroesophageal reflux disease)    GOUT 10/24/2010   Hyperglycemia    HYPERTENSION 07/21/2007   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   Leukopenia    Long term (current) use of anticoagulants 04/03/2011   PULMONARY EMBOLISM 05/03/2009   Sleep apnea    CPAP at bedtime   UNSPECIFIED URINARY CALCULUS     Past Surgical History:  Procedure Laterality Date   ANTERIOR CRUCIATE LIGAMENT  REPAIR  2000   Right   CYSTECTOMY     back of head   KNEE ARTHROSCOPY     left knee   KNEE ARTHROSCOPY  01/03/2013   Procedure: ARTHROSCOPY KNEE;  Surgeon: Yvette Rack., MD;  Location: [place];  Service: Orthopedics;  Laterality: Left;  Lavage Synovectomy, Removal of loose body   KNEE ARTHROSCOPY W/ ACL RECONSTRUCTION     right knee     Home Medications:  Prior to Admission medications   Medication Sig Start Date End Date Taking? Authorizing Provider  atorvastatin (LIPITOR) 40 MG tablet Take 1 tablet (40 mg total) by mouth daily. 12/20/20  Yes Lesleigh Noe, MD  dapagliflozin propanediol (FARXIGA) 5 MG TABS tablet Take 1 tablet (5 mg total) by mouth daily. 11/01/20  Yes Lesleigh Noe, MD  furosemide (LASIX) 40 MG tablet TAKE 1 TABLET BY MOUTH EVERY DAY Patient taking differently: Take 40 mg by mouth daily. 09/11/21  Yes Lesleigh Noe, MD  losartan (COZAAR) 100 MG tablet Take 1 tablet (100 mg total) by mouth daily. 11/01/20  Yes Lesleigh Noe, MD  methimazole (TAPAZOLE) 10 MG tablet TAKE 1 TABLET(10 MG) BY MOUTH DAILY Patient taking differently: Take 10 mg by mouth daily. 07/23/21  Yes Lesleigh Noe, MD  metoprolol succinate (TOPROL-XL) 50 MG 24 hr tablet Take 1 tablet (50 mg total) by mouth daily. 12/18/20  Yes Waunita Schooner  R, MD  polyvinyl alcohol (LIQUIFILM TEARS) 1.4 % ophthalmic solution Place 1 drop into both eyes as needed for dry eyes.   Yes [provider]  rivaroxaban (XARELTO) 20 MG TABS tablet TAKE 1 TABLET(20 MG) BY MOUTH DAILY WITH SUPPER Patient taking differently: Take 20 mg by mouth every morning. 12/24/21 02/09/22 Yes Dugal, Tabitha, FNP  sitaGLIPtin (JANUVIA) 100 MG tablet Take 1 tablet (100 mg total) by mouth daily. 12/18/20  Yes Lesleigh Noe, MD  verapamil (CALAN-SR) 240 MG CR tablet Take 1 tablet (240 mg total) by mouth at bedtime. Patient taking differently: Take 240 mg by mouth every morning. 11/01/20  Yes Lesleigh Noe, MD  benzonatate (TESSALON)  100 MG capsule Take 1-2 capsules (100-200 mg total) by mouth 3 (three) times daily as needed. Patient not taking: Reported on 02/09/2022 10/08/21   Jaynee Eagles, PA-C  furosemide (LASIX) 40 MG tablet Take 1 tablet (40 mg total) by mouth daily. Patient not taking: Reported on 02/09/2022 12/24/21   Wynona Dove A, DO  glucose blood (CONTOUR NEXT TEST) test strip 1 each by Other route daily. And lancets 1/day 09/02/19   Renato Shin, MD  lidocaine (LIDODERM) 5 % Place 1 patch onto the skin daily. Remove & Discard patch within 12 hours or as directed by MD Patient not taking: Reported on 02/09/2022 12/11/21   Sherrill Raring, PA-C  meloxicam (MOBIC) 7.5 MG tablet Take 1 tablet (7.5 mg total) by mouth daily. Patient not taking: Reported on 02/09/2022 12/11/21   Sherrill Raring, PA-C  methimazole (TAPAZOLE) 10 MG tablet Take 1 tablet (10 mg total) by mouth daily. Patient not taking: Reported on 02/09/2022 12/24/21 02/09/22  Jeanell Sparrow, DO  promethazine-dextromethorphan (PROMETHAZINE-DM) 6.25-15 MG/5ML syrup Take 5 mLs by mouth at bedtime as needed for cough. Patient not taking: Reported on 02/09/2022 10/08/21   Jaynee Eagles, PA-C    Inpatient Medications: Scheduled Meds:  atorvastatin  40 mg Oral Daily   dapagliflozin propanediol  5 mg Oral Daily   [START ON 02/11/2022] diltiazem  240 mg Oral Daily   diltiazem  60 mg Oral Q6H   furosemide  40 mg Oral Daily   insulin aspart  0-9 Units Subcutaneous TID WC & HS   linagliptin  5 mg Oral Daily   losartan  100 mg Oral Daily   methimazole  10 mg Oral Daily   metoprolol succinate  50 mg Oral Daily   rivaroxaban  20 mg Oral Daily   sodium chloride flush  3 mL Intravenous Q12H   Continuous Infusions:  sodium chloride     sodium chloride     PRN Meds: sodium chloride, acetaminophen **OR** acetaminophen, metoprolol tartrate, senna-docusate, sodium chloride flush  Allergies:    Allergies  Allergen Reactions   Metformin Diarrhea and Nausea Only   Viagra  [Sildenafil Citrate] Other (See Comments)    headache    Social History:   Social History   Socioeconomic History   Marital status: Married    Spouse name: Sharyn Lull   Number of children: 3   Years of education: high school   Highest education level: Not on file  Occupational History   Occupation: Truck Education administrator: [place]    Employer: [employer]  Tobacco Use   Smoking status: Some Days    Types: Cigars   Smokeless tobacco: Never  Vaping Use   Vaping Use: Never used  Substance and Sexual Activity   Alcohol use: Yes  Comment: weekends   Drug use: No   Sexual activity: Not on file  Other Topics Concern   Not on file  Social History Narrative   11/01/20   From: the area   Living: with wife, Sharyn Lull [phone])   Work: truck Geophysicist/field seismologist - concrete      Family: 3 adult children - [place], [place], Jonnie Kind      Enjoys: play basketball, fish, spend time with family      Exercise: stationary bike - rare use   Diet: grilled and baked food      Safety   Seat belts: Yes    Guns: No   Safe in relationships: Yes    Social Determinants of Radio broadcast assistant Strain: Not on file  Food Insecurity: Not on file  Transportation Needs: Not on file  Physical Activity: Not on file  Stress: Not on file  Social Connections: Not on file  Intimate Partner Violence: Not on file    Family History:   Family History  Problem Relation Age of Onset   Diabetes Mother    Hypertension Mother    Hyperlipidemia Mother    Dementia Mother    Diabetes Father    Hypertension Father    Hyperlipidemia Father    Heart attack Father    Heart disease Father    Prostate cancer Father 44   Stomach cancer Neg Hx    Colon cancer Neg Hx    Pancreatic cancer Neg Hx    Esophageal cancer Neg Hx    Rectal cancer Neg Hx      ROS:  Please see the history of present illness.  All other ROS reviewed and negative.     Physical Exam/Data:   Vitals:   02/09/22  2328 02/10/22 0501 02/10/22 0848 02/10/22 1231  BP:  (!) 137/98 (!) 150/97 113/82  Pulse: (!) 107 72 97   Resp: 18 18  17   Temp:  98.1 F (36.7 C)    TempSrc:  Oral    SpO2: 95%     Weight:  116.6 kg    Height:        Intake/Output Summary (Last 24 hours) at 02/10/2022 1540 Last data filed at 02/10/2022 1200 Gross per 24 hour  Intake 1083 ml  Output 600 ml  Net 483 ml   Last 3 Weights 02/10/2022 02/09/2022 02/08/2022  Weight (lbs) 257 lb 1.6 oz 247 lb 3.2 oz 248 lb 9.6 oz  Weight (kg) 116.62 kg 112.129 kg 112.764 kg     Body mass index is 34.87 kg/m.  General:  Well nourished, well developed, in no acute distress HEENT: normal Neck: no JVD Vascular: No carotid bruits Cardiac:  irreg-irreg; no murmurs, gallops or rubs Lungs:  CTA b/l, no wheezing, rhonchi or rales  Abd: soft, nontender  Ext: no edema Musculoskeletal:  No deformities Skin: warm and dry  Neuro:  no focal abnormalities noted Psych:  Normal affect   EKG:  The EKG was personally reviewed and demonstrates:   AFlutter (typical) 123bpm AFlutter 103bpm, PVCs  Telemetry:  Telemetry was personally reviewed and demonstrates:   AFlutter 80's-120's, occ PVCs  Relevant CV Studies:  02/09/22: TTE  1. Left ventricular ejection fraction, by estimation, is 55 to 60%. The  left ventricle has normal function. The left ventricle has no regional  wall motion abnormalities. Left ventricular diastolic function could not  be evaluated.   2. Right ventricular systolic function is normal. The right ventricular  size is normal. There is  moderately elevated pulmonary artery systolic  pressure. The estimated right ventricular systolic pressure is 29.5 mmHg.   3. The mitral valve is normal in structure. Mild mitral valve  regurgitation. No evidence of mitral stenosis.   4. The aortic valve is tricuspid. Aortic valve regurgitation is trivial.  Aortic valve sclerosis is present, with no evidence of aortic valve  stenosis. Aortic  valve area, by VTI measures 2.32 cm. Aortic valve mean  gradient measures 3.0 mmHg. Aortic  valve Vmax measures 1.25 m/s.   5. Aortic dilatation noted. There is mild dilatation of the aortic root,  measuring 39 mm.   6. The inferior vena cava is normal in size with greater than 50%  respiratory variability, suggesting right atrial pressure of 3 mmHg.    10/14/2018: TTE Study Conclusions  - Left ventricle: The cavity size was normal. Systolic function was    normal. The estimated ejection fraction was in the range of 55%    to 60%. Wall motion was normal; there were no regional wall    motion abnormalities. Doppler parameters are consistent with    abnormal left ventricular relaxation (grade 1 diastolic    dysfunction). Doppler parameters are consistent with elevated    mean left atrial filling pressure.  - Aortic valve: There was trivial regurgitation.  - Left atrium: The atrium was mildly dilated.      07/08/16: TTE Study Conclusions - Left ventricle: The cavity size was normal. Systolic function was   normal. The estimated ejection fraction was in the range of 55%   to 60%. Wall motion was normal; there were no regional wall   motion abnormalities. There was an increased relative   contribution of atrial contraction to ventricular filling.   Doppler parameters are consistent with abnormal left ventricular   relaxation (grade 1 diastolic dysfunction). - Aortic valve: Trileaflet; normal thickness, mildly calcified   leaflets. - Aorta: Aortic root dimension: 42 mm (ED). - Aortic root: The aortic root was mildly dilated. - Mitral valve: There was trivial regurgitation. - Atrial septum: A patent foramen ovale cannot be excluded. - Tricuspid valve: There was mild regurgitation. - Recommendations: Agitated saline contrast study to rule out PFO. Recommendations:  Agitated saline contrast study to rule out PFO.   07/09/16: stress myoview Nuclear stress EF: 51%. There was no ST  segment deviation noted during stress. The study is normal. This is a low risk study. The left ventricular ejection fraction is mildly decreased (45-54%). Normal exercise nuclear stress test with no evidence of prior infarct or ischemia.  LVEF calculated at 51% but visually appears better.    01/24/13: Echocardiogram Study Conclusions - Left ventricle: The cavity size was moderately dilated.   Wall thickness was normal. Systolic function was normal.   The estimated ejection fraction was in the range of 50% to   55%. - Mitral valve: Moderate regurgitation. - Left atrium: The atrium was moderately dilated. - Right ventricle: The cavity size was moderately dilated.   Systolic function was mildly reduced. - Right atrium: The atrium was moderately dilated. - Tricuspid valve: Moderate regurgitation. - Pulmonary arteries: Systolic pressure was mildly to   moderately increased. PA peak pressure: 53mm Hg (S).    Laboratory Data:  High Sensitivity Troponin:   Recent Labs  Lab 02/08/22 1157 02/08/22 1646  TROPONINIHS 11 9     Chemistry Recent Labs  Lab 02/08/22 1157 02/09/22 0311  NA 138 139  K 3.7 4.4  CL 105 108  CO2 24 20*  GLUCOSE 211* 128*  BUN 9 7  CREATININE 1.15 1.25*  CALCIUM 8.7* 8.5*  GFRNONAA >60 >60  ANIONGAP 9 11    No results for input(s): PROT, ALBUMIN, AST, ALT, ALKPHOS, BILITOT in the last 168 hours. Lipids No results for input(s): CHOL, TRIG, HDL, LABVLDL, LDLCALC, CHOLHDL in the last 168 hours.  Hematology Recent Labs  Lab 02/08/22 1157 02/09/22 0311  WBC 4.3 5.3  RBC 5.39 5.24  HGB 14.2 14.2  HCT 44.2 43.5  MCV 82.0 83.0  MCH 26.3 27.1  MCHC 32.1 32.6  RDW 16.2* 16.3*  PLT 210 201   Thyroid  Recent Labs  Lab 02/08/22 1247  TSH 0.523    BNP Recent Labs  Lab 02/08/22 1157  BNP 270.2*    DDimer No results for input(s): DDIMER in the last 168 hours.   Radiology/Studies:  DG Chest 2 View Result Date: 02/08/2022 CLINICAL DATA:   56 year old male with history of shortness of breath. EXAM: CHEST - 2 VIEW COMPARISON:  Chest x-ray 05/07/2021. FINDINGS: Lung volumes are normal. No consolidative airspace disease. No pleural effusions. No pneumothorax. No pulmonary nodule or mass noted. Pulmonary vasculature and the cardiomediastinal silhouette are within normal limits. IMPRESSION: No radiographic evidence of acute cardiopulmonary disease. Electronically Signed   By: Vinnie Langton M.D.   On: 02/08/2022 13:20        Assessment and Plan:    1. PAFib, in the setting of hyperthyroism     CHA2DS2Vasc is at least 2, on  Xarelto for h/o DVT as well, appropriately dosed     TSH is wnl Dr. Lovena Le has seen and examined the patient Discussed management options, TEE/DCCV vs TEE/EPS/ablation Patient would like to pursue ablation     2. 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 VHD, mod MR      No significant VHD by last echo, outpt or here  Risk Assessment/Risk Scores:    For questions or updates, please contact [place] Please consult [email or web address] for contact info under    Signed, Baldwin Jamaica, PA-C  02/10/2022 3:40 PM  EP Attending  Patient seen and examined. Agree with the findings as noted above. The patient is a pleasant 56 yo man with HTN, h/o DVT, and atrial flutter who presents with palpitations and worsening sob and atrial flutter with a RVR. He admits to some non-compliance with his [place]. He has sob and fatigue. He denies syncope. He has been found to have atrial flutter with a CVR/RVR. His exam is notable for a well appearing middle aged man, NAD. Lungs are clear and CV reveals an ireg tachy. Ext without edema. ECG - typical atrial flutter.  A/P Typical atrial flutter - I have discussed the treatment options with the patient and the risks/benefits/goals/expectations of EP study and ablation were reviewed and he  wishes to proceed.  Carleene Overlie Somara Frymire,MD

## 2022-02-11 ENCOUNTER — Encounter (HOSPITAL_COMMUNITY): Payer: Self-pay | Admitting: Internal Medicine

## 2022-02-11 ENCOUNTER — Inpatient Hospital Stay (HOSPITAL_COMMUNITY): Payer: BC Managed Care – PPO

## 2022-02-11 ENCOUNTER — Encounter (HOSPITAL_COMMUNITY)
Admission: EM | Disposition: A | Discharge status: Home / Self Care | Payer: Self-pay | Source: Home / Self Care | Attending: Internal Medicine

## 2022-02-11 DIAGNOSIS — I5021 Acute systolic (congestive) heart failure: Secondary | ICD-10-CM | POA: Diagnosis not present

## 2022-02-11 DIAGNOSIS — I483 Typical atrial flutter: Secondary | ICD-10-CM | POA: Diagnosis not present

## 2022-02-11 DIAGNOSIS — I4892 Unspecified atrial flutter: Secondary | ICD-10-CM

## 2022-02-11 DIAGNOSIS — I480 Paroxysmal atrial fibrillation: Secondary | ICD-10-CM | POA: Diagnosis not present

## 2022-02-11 DIAGNOSIS — N182 Chronic kidney disease, stage 2 (mild): Secondary | ICD-10-CM | POA: Diagnosis not present

## 2022-02-11 HISTORY — PX: TEE WITHOUT CARDIOVERSION: SHX5443

## 2022-02-11 HISTORY — PX: A-FLUTTER ABLATION: EP1230

## 2022-02-11 LAB — BASIC METABOLIC PANEL
Anion gap: 13 (ref 5–15)
BUN: 14 mg/dL (ref 6–20)
CO2: 20 mmol/L — ABNORMAL LOW (ref 22–32)
Calcium: 8.4 mg/dL — ABNORMAL LOW (ref 8.9–10.3)
Chloride: 104 mmol/L (ref 98–111)
Creatinine, Ser: 1.33 mg/dL — ABNORMAL HIGH (ref 0.61–1.24)
GFR, Estimated: >60 mL/min (ref >60)
Glucose, Bld: 90 mg/dL (ref 70–99)
Potassium: 3.9 mmol/L (ref 3.5–5.1)
Sodium: 137 mmol/L (ref 135–145)

## 2022-02-11 LAB — GLUCOSE, CAPILLARY
Glucose-Capillary: 110 mg/dL — ABNORMAL HIGH (ref 70–99)
Glucose-Capillary: 111 mg/dL — ABNORMAL HIGH (ref 70–99)
Glucose-Capillary: 177 mg/dL — ABNORMAL HIGH (ref 70–99)
Glucose-Capillary: 138 mg/dL — ABNORMAL HIGH (ref 70–99)

## 2022-02-11 SURGERY — ECHOCARDIOGRAM, TRANSESOPHAGEAL
Anesthesia: Monitor Anesthesia Care

## 2022-02-11 SURGERY — A-FLUTTER ABLATION

## 2022-02-11 MED ORDER — MIDAZOLAM HCL 5 MG/5ML IJ SOLN
INTRAMUSCULAR | Status: AC
  Filled 2022-02-11: qty 5

## 2022-02-11 MED ORDER — HEPARIN SODIUM (PORCINE) 1000 UNIT/ML IJ SOLN
INTRAMUSCULAR | Status: DC | PRN
  Administered 2022-02-11: 1000 [IU] via INTRAVENOUS

## 2022-02-11 MED ORDER — FENTANYL CITRATE (PF) 100 MCG/2ML IJ SOLN
INTRAMUSCULAR | Status: DC | PRN
  Administered 2022-02-11: 50 ug via INTRAVENOUS
  Administered 2022-02-11 (×5): 25 ug via INTRAVENOUS

## 2022-02-11 MED ORDER — SODIUM CHLORIDE 0.9% FLUSH
3.0000 mL | Freq: Two times a day (BID) | INTRAVENOUS | Status: DC
  Administered 2022-02-11: 3 mL via INTRAVENOUS

## 2022-02-11 MED ORDER — ONDANSETRON HCL 4 MG/2ML IJ SOLN: 4.0000 mg | Freq: Four times a day (QID) | INTRAMUSCULAR | Status: DC | PRN

## 2022-02-11 MED ORDER — FENTANYL CITRATE (PF) 100 MCG/2ML IJ SOLN
INTRAMUSCULAR | Status: AC
  Filled 2022-02-11: qty 2

## 2022-02-11 MED ORDER — BUPIVACAINE HCL (PF) 0.25 % IJ SOLN
INTRAMUSCULAR | Status: AC
  Filled 2022-02-11: qty 30

## 2022-02-11 MED ORDER — MIDAZOLAM HCL 5 MG/5ML IJ SOLN
INTRAMUSCULAR | Status: DC | PRN
  Administered 2022-02-11 (×4): 1 mg via INTRAVENOUS
  Administered 2022-02-11 (×3): 2 mg via INTRAVENOUS

## 2022-02-11 MED ORDER — SODIUM CHLORIDE 0.9% FLUSH: 3.0000 mL | INTRAVENOUS | Status: DC | PRN

## 2022-02-11 MED ORDER — HYDRALAZINE HCL 50 MG PO TABS
50.0000 mg | ORAL_TABLET | Freq: Two times a day (BID) | ORAL | Status: DC
  Administered 2022-02-11: 50 mg via ORAL
  Filled 2022-02-11: qty 1

## 2022-02-11 MED ORDER — SODIUM CHLORIDE 0.9 % IV SOLN: 250.0000 mL | INTRAVENOUS | Status: DC | PRN

## 2022-02-11 MED ORDER — BUPIVACAINE HCL (PF) 0.25 % IJ SOLN
INTRAMUSCULAR | Status: DC | PRN
  Administered 2022-02-11: 20 mL

## 2022-02-11 MED ORDER — HEPARIN (PORCINE) IN NACL 1000-0.9 UT/500ML-% IV SOLN
INTRAVENOUS | Status: DC | PRN
  Administered 2022-02-11: 500 mL

## 2022-02-11 MED ORDER — ACETAMINOPHEN 325 MG PO TABS
650.0000 mg | ORAL_TABLET | ORAL | Status: DC | PRN
  Administered 2022-02-11 – 2022-02-12 (×2): 650 mg via ORAL
  Filled 2022-02-11 (×2): qty 2

## 2022-02-11 MED ORDER — HEPARIN SODIUM (PORCINE) 1000 UNIT/ML IJ SOLN
INTRAMUSCULAR | Status: AC
  Filled 2022-02-11: qty 10

## 2022-02-11 SURGICAL SUPPLY — 11 items
CATH SMTCH THERMOCOOL SF FJ (CATHETERS) ×1 IMPLANT
CATH WEB BI DIR CSDF CRV REPRO (CATHETERS) ×1 IMPLANT
MAT PREVALON FULL STRYKER (MISCELLANEOUS) ×1 IMPLANT
PACK EP LATEX FREE (CUSTOM PROCEDURE TRAY) ×2
PACK EP LF (CUSTOM PROCEDURE TRAY) ×1 IMPLANT
PAD DEFIB RADIO PHYSIO CONN (PAD) ×2 IMPLANT
PATCH CARTO3 (PAD) ×1 IMPLANT
SHEATH PINNACLE 6F 10CM (SHEATH) IMPLANT
SHEATH PINNACLE 7F 10CM (SHEATH) ×1 IMPLANT
SHEATH PINNACLE 8F 10CM (SHEATH) ×1 IMPLANT
TUBING SMART ABLATE COOLFLOW (TUBING) ×1 IMPLANT

## 2022-02-11 NOTE — Progress Notes (Addendum)
Progress Note   Patient: Spencer English BHA:[id number] DOB: [birth year] DOA: 02/08/2022     3 DOS: the patient was seen and examined on 02/11/2022   Brief hospital course: Spencer English was admitted to the hospital with the working diagnosis of atrial fibrillation with rapid ventricular response.   56 yo male with the past medical history of paroxysmal atrial fibrillation, heart failure, HTN, T2DM, and hyperthyroid who presented with dyspnea. He reported palpitations and fluttering in his chest. For the last 2 months positive dyspnea on exertion and he has not being compliant with his medications. On his initial physical examination his blood pressure was 147/107 HR 133, RR 28, oxygen saturation 97%. His lungs were clear to auscultation, heart with S1 and S2 present, irregularly irregular, abdomen soft and positive lower extremity edema.   Na 138, K 3,7, CL 105, bicarbonate 24, glucose 211, bun 9 and cr 1,15 BNP 270 Wbc 4,3 hgb 14,2 hct 44, plt 210  Sars covid 19 negative  EKG 123 bpm, left axis deviation, atrial flutter rhythm with variable block, no significant ST segment or T wave changes.   Patient was placed on diltiazem drip for rate control and continue anticoagulation with rivaroxaban.   Successfully transitioned to oral diltiazem with good toleration.  Cardiology was consulted for typical flutter. 02/14 radiofrequency ablation of atrial flutter along the cavo- tricuspid isthmus with complete bidirectional isthmus block achieved.   Assessment and Plan: * A-fib ([place])- (present on admission) Echocardiogram with preserved LV systolic function with EF 55 to 60% with preserved RV systolic function, mild dilatation of the aortic root, with no significant valvular disease.   Rate continue to be controlled with diltiazem and metoprolol.  TEE today with no appendage clot, with depressed LVEF 35% with global hypokinesis.  Radiofrequency ablation with successful conversion to sinus rhythm.    Plan to continue diltiazem and metoprolol,  Anticoagulation with rivaroxaban  Possible dc home in am.   Essential hypertension- (present on admission) Blood pressure has been elevated today up to 151/105 mmHg  Plan to continue with metoprolol and losartan. Diuretic therapy with furosemide.  Will add hydralazine 50 mg po bid.   Acute clinical systolic heart failure ([place])- (present on admission) Today with no signs of decompensation, his transesophageal echocardiogram showed EF LV 35% with hypokinesis, consistent with reduced ejection fraction heart failure (new).   Patient will need follow up echocardiogram as outpatient to follow up on EF after returning to sinus rhythm.   Hyperthyroidism- (present on admission) Continue with methimazole.  TSH on admission within normal limits.   Type 2 diabetes mellitus with hyperlipidemia ([place])- (present on admission) Continue with empagliflozin and linagliptin.  On statin therapy   CKD (chronic kidney disease) stage 2, GFR 60-89 ml/min- (present on admission) Renal function stable with serum cr at 1,22 with K at 3,9 and serum bicarbonate at 20. Continue close follow up of renal function in am, avoid hypotension and nephrotoxic medications.   Class 1 obesity- (present on admission) Calculated BMI is 33,5.   History of pulmonary embolus (PE)- (present on admission) On rivaroxaban for anticoagulation, possible non compliance.  Patient with chronic lower extremity edema.   Phlebitis Clinically improved.   Chronic anticoagulation Continue xarelto        Subjective: Patient with no nausea or vomiting, no chest pain or dyspnea.   Physical Exam: Vitals:   02/11/22 1205 02/11/22 1207 02/11/22 1209 02/11/22 1210  BP: (!) 170/138 (!) 146/109 (!) 148/107 (!) 148/107  Pulse: (!) 0 Marland Kitchen)  0 (!) 0 (!) 0  Resp:      Temp:      TempSrc:      SpO2: 97%     Weight:      Height:       Neurology awake and alert ENT with no  pallor Cardiovascular with S1 and S2 present and rhythmic, with no gallops or murmurs, no rubs No JVD Positive non pitting bilateral lower extremity edema Respiratory with no wheezing or rales Abdomen soft and non tender   Data Reviewed:    Family Communication: I spoke with Spencer English  at the bedside, we talked in detail about Spencer condition, plan of care and prognosis and all questions were addressed.   Disposition: Status is: Inpatient Remains inpatient appropriate because: sp ablation      Planned Discharge Destination: Home     Author: Tawni Millers, MD 02/11/2022 1:42 PM  For on call review [email or web address].

## 2022-02-11 NOTE — Op Note (Signed)
INDICATIONS: Atrial flutter precardioversion  PROCEDURE:   Informed consent was obtained prior to the procedure. The risks, benefits and alternatives for the procedure were discussed and the patient comprehended these risks.  Risks include, but are not limited to, cough, sore throat, vomiting, nausea, somnolence, esophageal and stomach trauma or perforation, bleeding, low blood pressure, aspiration, pneumonia, infection, trauma to the teeth and death.    After a procedural time-out, the oropharynx was anesthetized with 20% benzocaine spray.   During this procedure the patient was administered a total of Versed 6 mg and Fentanyl 125 mg to achieve and maintain moderate conscious sedation.  The patient's heart rate, blood pressure, and oxygen saturationweare monitored continuously during the procedure. The period of conscious sedation was 21 minutes, of which I was present face-to-face 100% of this time.  The transesophageal probe was inserted in the esophagus and stomach without difficulty and multiple views were obtained.  The patient was kept under observation until the patient left the procedure room.  The patient left the procedure room in stable condition.   Agitated microbubble saline contrast was not administered.  COMPLICATIONS:    There were no immediate complications.  FINDINGS:  No evidence of intra-atrial thrombus. Good LA appendage emptying velocities. Depressed LVEF (35%, global hypokinesis) and RVEF. No valvular abnormalities, pericardial effusion or meaningful aortic atherosclerosis  RECOMMENDATIONS:     Proceed with ablation.  Time Spent Directly with the Patient:  30 minutes   Spencer English 02/11/2022, 11:03 AM

## 2022-02-11 NOTE — Interval H&P Note (Signed)
History and Physical Interval Note:  02/11/2022 10:02 AM  Spencer English  has presented today for surgery, with the diagnosis of aflutter.  The various methods of treatment have been discussed with the patient and family. After consideration of risks, benefits and other options for treatment, the patient has consented to  Procedure(s): A-FLUTTER ABLATION (N/A) TRANSESOPHAGEAL ECHOCARDIOGRAM (TEE) (N/A) as a surgical intervention.  The patient's history has been reviewed, patient examined, no change in status, stable for surgery.  I have reviewed the patient's chart and labs.  Questions were answered to the patient's satisfaction.     [name]

## 2022-02-11 NOTE — Discharge Instructions (Signed)
Post procedure care instructions No driving for 4 days. No lifting over 5 lbs for 1 week. No vigorous or sexual activity for 1 week. You may return to work/your usual activities on 02/19/22. Keep procedure site clean & dry. If you notice increased pain, swelling, bleeding or pus, call/return!  You may shower after 24 hours, but no soaking in baths/hot tubs/pools for 1 week.

## 2022-02-11 NOTE — Progress Notes (Signed)
Patient has CPAP set up at bedside. Equipment in reach. Patient states he is able to place himself on/off as needed. Patient aware to call for assistance if needed.

## 2022-02-11 NOTE — Progress Notes (Signed)
°  Echocardiogram Echocardiogram Transesophageal has been performed.  Spencer English 02/11/2022, 11:24 AM

## 2022-02-12 ENCOUNTER — Encounter: Payer: Self-pay | Admitting: Physician Assistant

## 2022-02-12 DIAGNOSIS — I4892 Unspecified atrial flutter: Secondary | ICD-10-CM | POA: Diagnosis not present

## 2022-02-12 DIAGNOSIS — R000 Tachycardia, unspecified: Secondary | ICD-10-CM | POA: Diagnosis not present

## 2022-02-12 DIAGNOSIS — I483 Typical atrial flutter: Secondary | ICD-10-CM | POA: Diagnosis not present

## 2022-02-12 DIAGNOSIS — I43 Cardiomyopathy in diseases classified elsewhere: Secondary | ICD-10-CM

## 2022-02-12 DIAGNOSIS — I10 Essential (primary) hypertension: Secondary | ICD-10-CM | POA: Diagnosis not present

## 2022-02-12 DIAGNOSIS — E0590 Thyrotoxicosis, unspecified without thyrotoxic crisis or storm: Secondary | ICD-10-CM

## 2022-02-12 DIAGNOSIS — N182 Chronic kidney disease, stage 2 (mild): Secondary | ICD-10-CM | POA: Diagnosis not present

## 2022-02-12 LAB — GLUCOSE, CAPILLARY: Glucose-Capillary: 162 mg/dL — ABNORMAL HIGH (ref 70–99)

## 2022-02-12 MED ORDER — AMLODIPINE BESYLATE 5 MG PO TABS: 5.0000 mg | ORAL_TABLET | Freq: Every day | ORAL | 0 refills | Status: DC

## 2022-02-12 MED ORDER — AMLODIPINE BESYLATE 5 MG PO TABS
5.0000 mg | ORAL_TABLET | Freq: Every day | ORAL | Status: DC
  Administered 2022-02-12: 5 mg via ORAL
  Filled 2022-02-12: qty 1

## 2022-02-12 NOTE — Progress Notes (Addendum)
Progress Note  Patient Name: Spencer English Date of Encounter: 02/12/2022  [facility] Cardiologist: EP- Lovena Le  Subjective   Feels well. In NSR. No dyspnea.  Inpatient Medications    Scheduled Meds:  atorvastatin  40 mg Oral Daily   dapagliflozin propanediol  5 mg Oral Daily   diltiazem  240 mg Oral Daily   furosemide  40 mg Oral Daily   hydrALAZINE  50 mg Oral q12n4p   insulin aspart  0-9 Units Subcutaneous TID WC & HS   linagliptin  5 mg Oral Daily   losartan  100 mg Oral Daily   methimazole  10 mg Oral Daily   metoprolol succinate  50 mg Oral Daily   rivaroxaban  20 mg Oral Daily   sodium chloride flush  3 mL Intravenous Q12H   sodium chloride flush  3 mL Intravenous Q12H   Continuous Infusions:  sodium chloride     sodium chloride     PRN Meds: sodium chloride, sodium chloride, acetaminophen, bupivacaine (PF), metoprolol tartrate, ondansetron (ZOFRAN) IV, senna-docusate, sodium chloride flush, sodium chloride flush   Vital Signs    Vitals:   02/11/22 1400 02/11/22 1612 02/11/22 2135 02/12/22 0530  BP:  (!) 138/97 105/82 (!) 130/92  Pulse:  75    Resp:  20  20  Temp: 98.9 F (37.2 C)     TempSrc: Oral     SpO2:  98% 97%   Weight:      Height:        Intake/Output Summary (Last 24 hours) at 02/12/2022 1006 Last data filed at 02/11/2022 1300 Gross per 24 hour  Intake 240 ml  Output 950 ml  Net -710 ml   Last 3 Weights 02/11/2022 02/10/2022 02/09/2022  Weight (lbs) 245 lb 14.4 oz 257 lb 1.6 oz 247 lb 3.2 oz  Weight (kg) 111.54 kg 116.62 kg 112.129 kg      Telemetry    NSR, PACs - Personally Reviewed  ECG    NSR w PACs, LVH, QTc 477 - Personally Reviewed  Physical Exam  Appears well. Groin OK. GEN: No acute distress.   Neck: No JVD Cardiac: RRR, no murmurs, rubs, or gallops.  Respiratory: Clear to auscultation bilaterally. GI: Soft, nontender, non-distended  MS: No edema; No deformity. Neuro:  Nonfocal  Psych: Normal affect   Labs     High Sensitivity Troponin:   Recent Labs  Lab 02/08/22 1157 02/08/22 1646  TROPONINIHS 11 9     Chemistry Recent Labs  Lab 02/08/22 1157 02/09/22 0311 02/11/22 0352  NA 138 139 137  K 3.7 4.4 3.9  CL 105 108 104  CO2 24 20* 20*  GLUCOSE 211* 128* 90  BUN 9 7 14   CREATININE 1.15 1.25* 1.33*  CALCIUM 8.7* 8.5* 8.4*  GFRNONAA >60 >60 >60  ANIONGAP 9 11 13     Lipids No results for input(s): CHOL, TRIG, HDL, LABVLDL, LDLCALC, CHOLHDL in the last 168 hours.  Hematology Recent Labs  Lab 02/08/22 1157 02/09/22 0311  WBC 4.3 5.3  RBC 5.39 5.24  HGB 14.2 14.2  HCT 44.2 43.5  MCV 82.0 83.0  MCH 26.3 27.1  MCHC 32.1 32.6  RDW 16.2* 16.3*  PLT 210 201   Thyroid  Recent Labs  Lab 02/08/22 1247  TSH 0.523    BNP Recent Labs  Lab 02/08/22 1157  BNP 270.2*    DDimer No results for input(s): DDIMER in the last 168 hours.   Radiology    EP  STUDY  Result Date: 02/11/2022 CONCLUSIONS: 1. Isthmus-dependent right atrial flutter upon presentation. 2. Successful radiofrequency ablation of atrial flutter along the cavotricuspid isthmus with complete bidirectional isthmus block achieved. 3. No inducible arrhythmias following ablation. 4. No early apparent complications. [name], MD 12:09 PM 02/11/2022    Cardiac Studies   Echo: 02/09/22   IMPRESSIONS     1. Left ventricular ejection fraction, by estimation, is 55 to 60%. The  left ventricle has normal function. The left ventricle has no regional  wall motion abnormalities. Left ventricular diastolic function could not  be evaluated.   2. Right ventricular systolic function is normal. The right ventricular  size is normal. There is moderately elevated pulmonary artery systolic  pressure. The estimated right ventricular systolic pressure is 43.1 mmHg.   3. The mitral valve is normal in structure. Mild mitral valve  regurgitation. No evidence of mitral stenosis.   4. The aortic valve is tricuspid. Aortic valve  regurgitation is trivial.  Aortic valve sclerosis is present, with no evidence of aortic valve  stenosis. Aortic valve area, by VTI measures 2.32 cm. Aortic valve mean  gradient measures 3.0 mmHg. Aortic  valve Vmax measures 1.25 m/s.   5. Aortic dilatation noted. There is mild dilatation of the aortic root,  measuring 39 mm.   6. The inferior vena cava is normal in size with greater than 50%  respiratory variability, suggesting right atrial pressure of 3 mmHg.   Patient Profile     56 y.o. 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   Medication Recommendations:   Metoprolol succinate 50 mg daily Xarelto 20 mg daily Losartan 100 mg daily Atorvastatin 40 mg daily Furosemide 40 mg daily Stop diltiazem/verapamil Start amlodipine 5 mg daily Other recommendations (labs, testing, etc):  recheck transthoracic echo in 3 months Follow up as an outpatient:  Afib clinic 2 weeks; Dr. Lovena Le 03/24.      For questions or updates, please contact [place] Please consult [email or web address] for contact info under        Signed, Sanda Klein, MD  02/12/2022, 10:06 AM

## 2022-02-12 NOTE — Care Management (Signed)
02-12-22 Xarelto co pay card provided to the patient. No further needs identified at this time.

## 2022-02-12 NOTE — Progress Notes (Addendum)
Progress Note  Patient Name: Spencer English Date of Encounter: 02/12/2022  [facility] Cardiologist: Dr. Rayann Heman >> Dr. Lovena Le  Subjective   Feels OK, no groin concerns/symptoms, no CP no SOB  Inpatient Medications    Scheduled Meds:  atorvastatin  40 mg Oral Daily   dapagliflozin propanediol  5 mg Oral Daily   diltiazem  240 mg Oral Daily   furosemide  40 mg Oral Daily   hydrALAZINE  50 mg Oral q12n4p   insulin aspart  0-9 Units Subcutaneous TID WC & HS   linagliptin  5 mg Oral Daily   losartan  100 mg Oral Daily   methimazole  10 mg Oral Daily   metoprolol succinate  50 mg Oral Daily   rivaroxaban  20 mg Oral Daily   sodium chloride flush  3 mL Intravenous Q12H   sodium chloride flush  3 mL Intravenous Q12H   Continuous Infusions:  sodium chloride     sodium chloride     PRN Meds: sodium chloride, sodium chloride, acetaminophen, bupivacaine (PF), metoprolol tartrate, ondansetron (ZOFRAN) IV, senna-docusate, sodium chloride flush, sodium chloride flush   Vital Signs    Vitals:   02/11/22 1400 02/11/22 1612 02/11/22 2135 02/12/22 0530  BP:  (!) 138/97 105/82 (!) 130/92  Pulse:  75    Resp:  20  20  Temp: 98.9 F (37.2 C)     TempSrc: Oral     SpO2:  98% 97%   Weight:      Height:        Intake/Output Summary (Last 24 hours) at 02/12/2022 0804 Last data filed at 02/11/2022 1300 Gross per 24 hour  Intake 240 ml  Output 950 ml  Net -710 ml   Last 3 Weights 02/11/2022 02/10/2022 02/09/2022  Weight (lbs) 245 lb 14.4 oz 257 lb 1.6 oz 247 lb 3.2 oz  Weight (kg) 111.54 kg 116.62 kg 112.129 kg      Telemetry    SR 80's - Personally Reviewed  ECG    SR 69bpm, PACs, lat T changes of unclear significance - Personally Reviewed  Physical Exam   GEN: No acute distress.   Neck: No JVD Cardiac: RRR, no murmurs, rubs, or gallops.  Respiratory: CTA b/l. GI: Soft, nontender, non-distended  MS: No edema; No deformity. Neuro:  Nonfocal  Psych: Normal affect    Labs    High Sensitivity Troponin:   Recent Labs  Lab 02/08/22 1157 02/08/22 1646  TROPONINIHS 11 9     Chemistry Recent Labs  Lab 02/08/22 1157 02/09/22 0311 02/11/22 0352  NA 138 139 137  K 3.7 4.4 3.9  CL 105 108 104  CO2 24 20* 20*  GLUCOSE 211* 128* 90  BUN 9 7 14   CREATININE 1.15 1.25* 1.33*  CALCIUM 8.7* 8.5* 8.4*  GFRNONAA >60 >60 >60  ANIONGAP 9 11 13     Lipids No results for input(s): CHOL, TRIG, HDL, LABVLDL, LDLCALC, CHOLHDL in the last 168 hours.  Hematology Recent Labs  Lab 02/08/22 1157 02/09/22 0311  WBC 4.3 5.3  RBC 5.39 5.24  HGB 14.2 14.2  HCT 44.2 43.5  MCV 82.0 83.0  MCH 26.3 27.1  MCHC 32.1 32.6  RDW 16.2* 16.3*  PLT 210 201   Thyroid  Recent Labs  Lab 02/08/22 1247  TSH 0.523    BNP Recent Labs  Lab 02/08/22 1157  BNP 270.2*    DDimer No results for input(s): DDIMER in the last 168 hours.   Radiology  Cardiac Studies   EP STUDY Result Date: 02/11/2022 CONCLUSIONS: 1. Isthmus-dependent right atrial flutter upon presentation. 2. Successful radiofrequency ablation of atrial flutter along the cavotricuspid isthmus with complete bidirectional isthmus block achieved. 3. No inducible arrhythmias following ablation. 4. No early apparent complications. [name], MD 12:09 PM 02/11/2022  02/11/22: TEE No evidence of intra-atrial thrombus. Good LA appendage emptying velocities. Depressed LVEF (35%, global hypokinesis) and RVEF. No valvular abnormalities, pericardial effusion or meaningful aortic atherosclerosis   02/09/22: TTE  1. Left ventricular ejection fraction, by estimation, is 55 to 60%. The  left ventricle has normal function. The left ventricle has no regional  wall motion abnormalities. Left ventricular diastolic function could not  be evaluated.   2. Right ventricular systolic function is normal. The right ventricular  size is normal. There is moderately elevated pulmonary artery systolic  pressure. The  estimated right ventricular systolic pressure is 03.5 mmHg.   3. The mitral valve is normal in structure. Mild mitral valve  regurgitation. No evidence of mitral stenosis.   4. The aortic valve is tricuspid. Aortic valve regurgitation is trivial.  Aortic valve sclerosis is present, with no evidence of aortic valve  stenosis. Aortic valve area, by VTI measures 2.32 cm. Aortic valve mean  gradient measures 3.0 mmHg. Aortic  valve Vmax measures 1.25 m/s.   5. Aortic dilatation noted. There is mild dilatation of the aortic root,  measuring 39 mm.   6. The inferior vena cava is normal in size with greater than 50%  respiratory variability, suggesting right atrial pressure of 3 mmHg.      10/14/2018: TTE Study Conclusions  - Left ventricle: The cavity size was normal. Systolic function was    normal. The estimated ejection fraction was in the range of 55%    to 60%. Wall motion was normal; there were no regional wall    motion abnormalities. Doppler parameters are consistent with    abnormal left ventricular relaxation (grade 1 diastolic    dysfunction). Doppler parameters are consistent with elevated    mean left atrial filling pressure.  - Aortic valve: There was trivial regurgitation.  - Left atrium: The atrium was mildly dilated.      07/08/16: TTE Study Conclusions - Left ventricle: The cavity size was normal. Systolic function was   normal. The estimated ejection fraction was in the range of 55%   to 60%. Wall motion was normal; there were no regional wall   motion abnormalities. There was an increased relative   contribution of atrial contraction to ventricular filling.   Doppler parameters are consistent with abnormal left ventricular   relaxation (grade 1 diastolic dysfunction). - Aortic valve: Trileaflet; normal thickness, mildly calcified   leaflets. - Aorta: Aortic root dimension: 42 mm (ED). - Aortic root: The aortic root was mildly dilated. - Mitral valve: There was  trivial regurgitation. - Atrial septum: A patent foramen ovale cannot be excluded. - Tricuspid valve: There was mild regurgitation. - Recommendations: Agitated saline contrast study to rule out PFO. Recommendations:  Agitated saline contrast study to rule out PFO.   07/09/16: stress myoview Nuclear stress EF: 51%. There was no ST segment deviation noted during stress. The study is normal. This is a low risk study. The left ventricular ejection fraction is mildly decreased (45-54%). Normal exercise nuclear stress test with no evidence of prior infarct or ischemia.  LVEF calculated at 51% but visually appears better.    01/24/13: Echocardiogram Study Conclusions - Left ventricle: The cavity size  was moderately dilated.   Wall thickness was normal. Systolic function was normal.   The estimated ejection fraction was in the range of 50% to   55%. - Mitral valve: Moderate regurgitation. - Left atrium: The atrium was moderately dilated. - Right ventricle: The cavity size was moderately dilated.   Systolic function was mildly reduced. - Right atrium: The atrium was moderately dilated. - Tricuspid valve: Moderate regurgitation. - Pulmonary arteries: Systolic pressure was mildly to   moderately increased. PA peak pressure: 61mm Hg (S).  Patient Profile     56 y.o. male with a hx of  PAFib/flutter in setting of hypothyroidism, felt to be resolved with treatment of his thyroid at his last visit with Dr. Rayann Heman in June 2015, HTN, HLD, hx of DVT/PE in 2010, DM, OSA w/ CPAP admitted with progressive fatigue, lightheaded, near syncope, palpitations found in AFlutter w/RVR  Assessment & Plan      1. PAFib, in the setting of hyperthyroism     CHA2DS2Vasc is at least 2, on  Xarelto for h/o DVT as well, appropriately dosed     TSH is wnl TEE/EPS-ablation yesterday with Dr. Lovena Le Continue Xarelto uninterrupted Procedure site is stable Activity restrictions reviewed with the pt EP follow up is  in place     2. Hx of DVT/PE     He is a truck drive     pulm note states advised to continue a/c while doing this work     continue [facility]     3. CM EF down by his TEE yesterday Would consider repeating echo in about 1mo Will benefit from having general cardiology team out patient as well And defer choices for GDMT to their service, on a BB/ARB outpt   Dr. Lovena Le has seen the patient this AM OK to discharge from an EP perspective when ready medically otherwise We will sign off though remain available, please recall if needed.  For questions or updates, please contact [place] Please consult [email or web address] for contact info under   Signed, Baldwin Jamaica, PA-C  02/12/2022, 8:04 AM    EP Attending  Patient seen and examined. Agree with above. The patient is doing well after EPS/RFA of atrial flutter. I expect that his LV dysfunction will resolve with return to NSR. I would recommend a 2D echo in 3-4 months. He is chronically on an [place] due to DVT. [place] for DC with usual followup. No lifting or strenuous activity for a week.   Carleene Overlie Marwan Lipe,MD

## 2022-02-14 NOTE — Telephone Encounter (Signed)
Patient needs appointment and specifically hospital f/u prior to additional refills.   1 month provided

## 2022-02-15 ENCOUNTER — Other Ambulatory Visit: Payer: Self-pay

## 2022-02-15 ENCOUNTER — Emergency Department (HOSPITAL_BASED_OUTPATIENT_CLINIC_OR_DEPARTMENT_OTHER)
Admission: EM | Admit: 2022-02-15 | Discharge: 2022-02-15 | Disposition: A | Discharge status: Home / Self Care | Payer: BC Managed Care – PPO | Attending: Emergency Medicine | Admitting: Emergency Medicine

## 2022-02-15 ENCOUNTER — Encounter (HOSPITAL_BASED_OUTPATIENT_CLINIC_OR_DEPARTMENT_OTHER): Payer: Self-pay

## 2022-02-15 DIAGNOSIS — I10 Essential (primary) hypertension: Secondary | ICD-10-CM

## 2022-02-15 DIAGNOSIS — I4891 Unspecified atrial fibrillation: Secondary | ICD-10-CM | POA: Insufficient documentation

## 2022-02-15 DIAGNOSIS — F17210 Nicotine dependence, cigarettes, uncomplicated: Secondary | ICD-10-CM | POA: Diagnosis not present

## 2022-02-15 DIAGNOSIS — E0590 Thyrotoxicosis, unspecified without thyrotoxic crisis or storm: Secondary | ICD-10-CM | POA: Diagnosis not present

## 2022-02-15 DIAGNOSIS — I808 Phlebitis and thrombophlebitis of other sites: Secondary | ICD-10-CM | POA: Insufficient documentation

## 2022-02-15 DIAGNOSIS — M79603 Pain in arm, unspecified: Secondary | ICD-10-CM | POA: Diagnosis not present

## 2022-02-15 LAB — BASIC METABOLIC PANEL WITH GFR
Anion gap: 8 (ref 5–15)
BUN: 14 mg/dL (ref 6–20)
CO2: 27 mmol/L (ref 22–32)
Calcium: 9.1 mg/dL (ref 8.9–10.3)
Chloride: 104 mmol/L (ref 98–111)
Creatinine, Ser: 1.18 mg/dL (ref 0.61–1.24)
GFR, Estimated: >60 mL/min
Glucose, Bld: 164 mg/dL — ABNORMAL HIGH (ref 70–99)
Potassium: 3.9 mmol/L (ref 3.5–5.1)
Sodium: 139 mmol/L (ref 135–145)

## 2022-02-15 LAB — CBC WITH DIFFERENTIAL/PLATELET
Abs Immature Granulocytes: 0.01 10*3/uL (ref 0.00–0.07)
Basophils Absolute: 0.1 10*3/uL (ref 0.0–0.1)
Basophils Relative: 1 %
Eosinophils Absolute: 0.2 10*3/uL (ref 0.0–0.5)
Eosinophils Relative: 4 %
HCT: 45.5 % (ref 39.0–52.0)
Hemoglobin: 14.5 g/dL (ref 13.0–17.0)
Immature Granulocytes: 0 %
Lymphocytes Relative: 24 %
Lymphs Abs: 1 10*3/uL (ref 0.7–4.0)
MCH: 26.6 pg (ref 26.0–34.0)
MCHC: 31.9 g/dL (ref 30.0–36.0)
MCV: 83.5 fL (ref 80.0–100.0)
Monocytes Absolute: 0.4 10*3/uL (ref 0.1–1.0)
Monocytes Relative: 10 %
Neutro Abs: 2.7 10*3/uL (ref 1.7–7.7)
Neutrophils Relative %: 61 %
Platelets: 221 10*3/uL (ref 150–400)
RBC: 5.45 MIL/uL (ref 4.22–5.81)
RDW: 16.1 % — ABNORMAL HIGH (ref 11.5–15.5)
WBC: 4.3 10*3/uL (ref 4.0–10.5)
nRBC: 0 % (ref 0.0–0.2)

## 2022-02-15 MED ORDER — METHIMAZOLE 10 MG PO TABS: 10.0000 mg | ORAL_TABLET | Freq: Every day | ORAL | 1 refills | Status: DC

## 2022-02-15 MED ORDER — METOPROLOL SUCCINATE ER 50 MG PO TB24: 50.0000 mg | ORAL_TABLET | Freq: Every day | ORAL | 1 refills | Status: DC

## 2022-02-15 MED ORDER — DOXYCYCLINE HYCLATE 100 MG PO CAPS: 100.0000 mg | ORAL_CAPSULE | Freq: Two times a day (BID) | ORAL | 0 refills | Status: AC

## 2022-02-15 NOTE — ED Provider Notes (Signed)
[facility] Emergency Department Provider Note MRN:  [id number]  Arrival date & time: 02/15/22     Chief Complaint   Arm Pain   History of Present Illness   Spencer English is a 56 y.o. year-old male with a history of pulmonary embolism presenting to the ED with chief complaint of arm pain.  Patient was recently hospitalized for atrial flutter with elevated heart rate.  He explains that he developed some pain at the IV site during hospitalization.  Continues to have pain in this area.  Over the past day or 2 it has slowly improved but is still tender.  Here for evaluation.  Denies fever, no other complaints.  Does request medication refill as well.  Review of Systems  A thorough review of systems was obtained and all systems are negative except as noted in the HPI and PMH.   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   Leukopenia    Long term (current) use of anticoagulants 04/03/2011   PULMONARY EMBOLISM 05/03/2009   Sleep apnea    CPAP at bedtime   UNSPECIFIED URINARY CALCULUS     Past Surgical History:  Procedure Laterality Date   A-FLUTTER ABLATION N/A 02/11/2022   Procedure: A-FLUTTER ABLATION;  Surgeon: Evans Lance, MD;  Location: [facility];  Service: Cardiovascular;  Laterality: N/A;   ANTERIOR CRUCIATE LIGAMENT REPAIR  2000   Right   CYSTECTOMY     back of head   KNEE ARTHROSCOPY     left knee   KNEE ARTHROSCOPY  01/03/2013   Procedure: ARTHROSCOPY KNEE;  Surgeon: Yvette Rack., MD;  Location: [facility];   Service: Orthopedics;  Laterality: Left;  Lavage Synovectomy, Removal of loose body   KNEE ARTHROSCOPY W/ ACL RECONSTRUCTION     right knee   TEE WITHOUT CARDIOVERSION N/A 02/11/2022   Procedure: TRANSESOPHAGEAL ECHOCARDIOGRAM (TEE);  Surgeon: Evans Lance, MD;  Location: [facility];  Service: Cardiovascular;  Laterality: N/A;    Family History  Problem Relation Age of Onset   Diabetes Mother    Hypertension Mother    Hyperlipidemia Mother    Dementia Mother    Diabetes Father    Hypertension Father    Hyperlipidemia Father    Heart attack Father    Heart disease Father    Prostate cancer Father 21   Stomach cancer Neg Hx    Colon cancer Neg Hx    Pancreatic cancer Neg Hx    Esophageal cancer Neg Hx    Rectal cancer Neg Hx     Social History   Socioeconomic History   Marital status: Married    Spouse name: Sharyn Lull   Number of children: 3   Years of education: high school   Highest education level: Not on file  Occupational History   Occupation: Truck Education administrator: [employer]    Employer: [employer]  Tobacco Use   Smoking status: Some Days  Types: Cigars   Smokeless tobacco: Never  Vaping Use   Vaping Use: Never used  Substance and Sexual Activity   Alcohol use: Yes    Comment: weekends   Drug use: No   Sexual activity: Not on file  Other Topics Concern   Not on file  Social History Narrative   11/01/20   From: the area   Living: with wife, Sharyn Lull (1990)   Work: truck Geophysicist/field seismologist - concrete      Family: 3 adult children - [place], [place], Jonnie Kind      Enjoys: play basketball, fish, spend time with family      Exercise: stationary bike - rare use   Diet: grilled and baked food      Safety   Seat belts: Yes    Guns: No   Safe in relationships: Yes    Social Determinants of Radio broadcast assistant Strain: Not on file  Food Insecurity: Not on file  Transportation Needs: Not on file  Physical Activity: Not on  file  Stress: Not on file  Social Connections: Not on file  Intimate Partner Violence: Not on file     Physical Exam   Vitals:   02/15/22 1240 02/15/22 1423  BP: (!) 130/100 (!) 151/102  Pulse: 66 68  Resp: 18 18  Temp: 98.2 F (36.8 C)   SpO2: 98% 100%    CONSTITUTIONAL: Well-appearing, NAD NEURO/PSYCH:  Alert and oriented x 3, no focal deficits EYES:  eyes equal and reactive ENT/NECK:  no LAD, no JVD CARDIO: Regular rate, well-perfused, normal S1 and S2 PULM:  CTAB no wheezing or rhonchi GI/GU:  non-distended, non-tender MSK/SPINE:  No gross deformities, no edema SKIN:  no rash, atraumatic; palpable noncompressible vein of the dorsal forearm with mild tenderness to palpation, mild erythema   *Additional and/or pertinent findings included in MDM below  Diagnostic and Interventional Summary    EKG Interpretation  Date/Time:    Ventricular Rate:    PR Interval:    QRS Duration:   QT Interval:    QTC Calculation:   R Axis:     Text Interpretation:         Labs Reviewed  BASIC METABOLIC PANEL - Abnormal; Notable for the following components:      Result Value   Glucose, Bld 164 (*)    All other components within normal limits  CBC WITH DIFFERENTIAL/PLATELET - Abnormal; Notable for the following components:   RDW 16.1 (*)    All other components within normal limits    No orders to display    Medications - No data to display   Procedures  /  Critical Care Procedures  ED Course and Medical Decision Making  Initial Impression and Ddx History and exam is consistent with a superficial phlebitis, seems to be clinically improving per patient.  He has no systemic symptoms, he has no significant pain or swelling to the upper or lower arm and so deep vein thrombosis is thought to be highly unlikely especially given his anticoagulated status.  There is some erythema and so will cover with doxycycline.  No other complaints, appropriate for discharge.  Past  medical/surgical history that increases complexity of ED encounter: None  Interpretation of Diagnostics I personally reviewed the laboratory assessment and my interpretation is as follows: No leukocytosis or significant blood count abnormalities or electrolyte disturbance      Patient Reassessment and Ultimate Disposition/Management Discharge home  Patient management required discussion with the following services or consulting  groups:  None  Complexity of Problems Addressed Acute complicated illness or Injury  Additional Data Reviewed and Analyzed Further history obtained from: Recent discharge summary  Additional Factors Impacting ED Encounter Risk Prescriptions  Barth Kirks. Sedonia Small, MD [place] [email or web address]  Final Clinical Impressions(s) / ED Diagnoses     ICD-10-CM   1. Superficial phlebitis of arm  I80.8     2. Essential hypertension  I10 metoprolol succinate (TOPROL-XL) 50 MG 24 hr tablet    3. Atrial fibrillation, unspecified type (HCC)  I48.91 metoprolol succinate (TOPROL-XL) 50 MG 24 hr tablet    4. Hyperthyroidism  E05.90 methimazole (TAPAZOLE) 10 MG tablet      ED Discharge Orders          Ordered    doxycycline (VIBRAMYCIN) 100 MG capsule  2 times daily        02/15/22 1513    metoprolol succinate (TOPROL-XL) 50 MG 24 hr tablet  Daily        02/15/22 1513    methimazole (TAPAZOLE) 10 MG tablet  Daily        02/15/22 1513             Discharge Instructions Discussed with and Provided to Patient:    Discharge Instructions      You were evaluated in the Emergency Department and after careful evaluation, we did not find any emergent condition requiring admission or further testing in the hospital.  Your exam/testing today was overall reassuring.  Symptoms seem to be due to a clotted and inflamed vein in your arm.  Take the doxycycline antibiotic to treat or prevent any infection in the arm.   Recommend continued warm compresses.  Please return to the Emergency Department if you experience any worsening of your condition.  Thank you for allowing [place] to be a part of your care.       Maudie Flakes, MD 02/15/22 [phone]

## 2022-02-15 NOTE — Discharge Instructions (Signed)
You were evaluated in the Emergency Department and after careful evaluation, we did not find any emergent condition requiring admission or further testing in the hospital.  Your exam/testing today was overall reassuring.  Symptoms seem to be due to a clotted and inflamed vein in your arm.  Take the doxycycline antibiotic to treat or prevent any infection in the arm.  Recommend continued warm compresses.  Please return to the Emergency Department if you experience any worsening of your condition.  Thank you for allowing [place] to be a part of your care.

## 2022-02-15 NOTE — ED Notes (Signed)
Had PIV site removed 02/08/22 due=ring admission due to phlebitis. D/C home 02/09/22.  States site has been redden and sore since.  Site left forearm 3-4 inches from wrist redden with redness following vein to A?C area.  Palpable cored along this length.

## 2022-02-15 NOTE — ED Triage Notes (Signed)
Patient reports being seen here on 02/08/22 and he had IV in left forearm infiltration. Pt's arm is reddened, warm, and tight. Pt reports it being painful and itching .

## 2022-02-16 NOTE — Discharge Summary (Signed)
Physician Discharge Summary  Spencer English JEH:[id number] DOB: [birth year] DOA: 02/08/2022  PCP: Lesleigh Noe, MD  Admit date: 02/08/2022 Discharge date: 02/12/2022  Time spent: 65minutes  Recommendations for Outpatient Follow-up:  Cardiology Dr.Allred in 1 month PCP in 1 week, please check BMP at FU Repeat ECHO in 2-13months Please review all imaging studies this admission for any incidental findings that need FU   Discharge Diagnoses:  Principal Problem:   A-fib [facility]) Active Problems:   Type 2 diabetes mellitus with hyperlipidemia ([place])   Essential hypertension   Hyperthyroidism   Class 1 obesity   History of pulmonary embolus (PE)   Acute clinical systolic heart failure (HCC)   CKD (chronic kidney disease) stage 2, GFR 60-89 ml/min   Phlebitis   Atrial flutter with rapid ventricular response (HCC)   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 He reported palpitations and fluttering in his chest. For the last 2 months positive dyspnea on exertion and he has not been compliant with his medications.  Hospital Course:   A-fib [facility])- (present on admission) with RVR Echocardiogram with preserved LV systolic function with EF 55 to 60% with preserved RV systolic function, mild dilatation of the aortic root, with no significant valvular disease.   -Rate continue to be controlled with diltiazem and metoprolol.  -EP consulted, TEE completed with no appendage clot, with depressed LVEF 35% with global hypokinesis, Radiofrequency ablation with successful conversion to sinus rhythm.   -Plan to continue metoprolol,  Anticoagulation with  rivaroxaban  - FU with EP and Gen cardiology  Cardiomyopathy Acute systolic CHF -TEE: reported EF of 35%, remained euvolemic -per Cardiology likely tachycardia induced -started on furosemide, losartan, metoprolol -repeat ECHO in 2-79months, plan for ischemic workup if cardiomyopathy persists   Essential hypertension- (present on admission) -stable, continue with metoprolol and losartan. Diuretic therapy with furosemide.    Hyperthyroidism- (present on admission) Continue with methimazole.  TSH on admission within normal limits.    Type 2 diabetes mellitus with hyperlipidemia ([place])- (present on admission) Continue with empagliflozin and linagliptin.  On statin therapy    CKD (chronic kidney disease) stage 2, GFR 60-89 ml/min- (present on admission) Renal function stable with serum cr at 1,22 with K at 3,9 and serum bicarbonate at 20. Continue close follow up of renal function in am, avoid hypotension and nephrotoxic medications.    Class 1 obesity- (present on admission) Calculated BMI is 33,5.    History of pulmonary embolus (PE)- (present on admission) On rivaroxaban for anticoagulation, possible non compliance.  Patient with chronic lower extremity edema.    Phlebitis Clinically improved.    Chronic anticoagulation Continue xarelto      Procedure: TEE and EPS ablation 2/14 by Dr.Taylor  Consultations: Cardiology  Discharge Exam: Vitals:   02/11/22 2135 02/12/22 0530  BP: 105/82 (!) 130/92  Pulse:    Resp:  20  Temp:    SpO2: 97%    GEn: AAOx3 HEENT: no JVD Cardiovascular with S1 and S2/RRR Lungs: CTAB Ext: no edema Respiratory with no wheezing or rales Abdomen soft and non tender      Discharge Instructions   Discharge Instructions  Amb referral to AFIB Clinic   Complete by: As directed    Diet - low sodium heart healthy   Complete by: As directed    Increase activity slowly   Complete by: As directed       Allergies as of 02/12/2022        Reactions   Metformin Diarrhea, Nausea Only   Viagra [sildenafil Citrate] Other (See Comments)   headache        Medication List     STOP taking these medications    benzonatate 100 MG capsule Commonly known as: TESSALON   lidocaine 5 % Commonly known as: Lidoderm   meloxicam 7.5 MG tablet Commonly known as: Mobic   promethazine-dextromethorphan 6.25-15 MG/5ML syrup Commonly known as: PROMETHAZINE-DM   verapamil 240 MG CR tablet Commonly known as: CALAN-SR       TAKE these medications    amLODipine 5 MG tablet Commonly known as: NORVASC Take 1 tablet (5 mg total) by mouth daily.   atorvastatin 40 MG tablet Commonly known as: LIPITOR Take 1 tablet (40 mg total) by mouth daily.   Contour Next Test test strip Generic drug: glucose blood 1 each by Other route daily. And lancets 1/day   dapagliflozin propanediol 5 MG Tabs tablet Commonly known as: Farxiga Take 1 tablet (5 mg total) by mouth daily.   furosemide 40 MG tablet Commonly known as: LASIX TAKE 1 TABLET BY MOUTH EVERY DAY   losartan 100 MG tablet Commonly known as: COZAAR Take 1 tablet (100 mg total) by mouth daily.   polyvinyl alcohol 1.4 % ophthalmic solution Commonly known as: LIQUIFILM TEARS Place 1 drop into both eyes as needed for dry eyes.   sitaGLIPtin 100 MG tablet Commonly known as: Januvia Take 1 tablet (100 mg total) by mouth daily.       Allergies  Allergen Reactions   Metformin Diarrhea and Nausea Only   Viagra [Sildenafil Citrate] Other (See Comments)    headache    Follow-up Information     Lesleigh Noe, MD. Go in 2 week(s).   Specialty: Family Medicine Contact information: [place] [phone]         Thompson Grayer, MD .   Specialty: Cardiology Contact information: [place]  [phone]                  The results of significant diagnostics from this  hospitalization (including imaging, microbiology, ancillary and laboratory) are listed below for reference.    Significant Diagnostic Studies: DG Chest 2 View  Result Date: 02/08/2022 CLINICAL DATA:  56 year old male with history of shortness of breath. EXAM: CHEST - 2 VIEW COMPARISON:  Chest x-ray 05/07/2021. FINDINGS: Lung volumes are normal. No consolidative airspace disease. No pleural effusions. No pneumothorax. No pulmonary nodule or mass noted. Pulmonary vasculature and the cardiomediastinal silhouette are within normal limits. IMPRESSION: No radiographic evidence of acute cardiopulmonary disease. Electronically Signed   By: Vinnie Langton M.D.   On: 02/08/2022 13:20   EP STUDY  Result Date: 02/11/2022 CONCLUSIONS: 1. Isthmus-dependent right atrial flutter upon presentation. 2. Successful radiofrequency ablation of atrial flutter along the cavotricuspid isthmus with complete bidirectional isthmus block achieved. 3. No inducible arrhythmias following ablation. 4. No early apparent complications. [name], MD 12:09 PM 02/11/2022   ECHOCARDIOGRAM COMPLETE  Result Date: 02/09/2022    ECHOCARDIOGRAM REPORT   Patient Name:   GABERIAL CADA Date of Exam: 02/09/2022 Medical  Rec #:  [id number]      Height:       72.0 in Accession #:    [id number]     Weight:       247.2 lb Date of Birth:  1966/01/19       BSA:          2.331 m Patient Age:    56 years       BP:           129/90 mmHg Patient Gender: M              HR:           72 bpm. Exam Location:  Inpatient Procedure: 2D Echo Indications:    Acute diastolic CHF  History:        Patient has prior history of Echocardiogram examinations, most                 recent 10/14/2018. Arrythmias:Atrial Fibrillation; Risk                 Factors:Diabetes, Dyslipidemia and Hypertension.  Sonographer:    Arlyss Gandy Referring Phys: [id number] [place]  1. Left ventricular ejection fraction, by estimation, is 55 to 60%. The left ventricle has  normal function. The left ventricle has no regional wall motion abnormalities. Left ventricular diastolic function could not be evaluated.  2. Right ventricular systolic function is normal. The right ventricular size is normal. There is moderately elevated pulmonary artery systolic pressure. The estimated right ventricular systolic pressure is 77.4 mmHg.  3. The mitral valve is normal in structure. Mild mitral valve regurgitation. No evidence of mitral stenosis.  4. The aortic valve is tricuspid. Aortic valve regurgitation is trivial. Aortic valve sclerosis is present, with no evidence of aortic valve stenosis. Aortic valve area, by VTI measures 2.32 cm. Aortic valve mean gradient measures 3.0 mmHg. Aortic valve Vmax measures 1.25 m/s.  5. Aortic dilatation noted. There is mild dilatation of the aortic root, measuring 39 mm.  6. The inferior vena cava is normal in size with greater than 50% respiratory variability, suggesting right atrial pressure of 3 mmHg. FINDINGS  Left Ventricle: Left ventricular ejection fraction, by estimation, is 55 to 60%. The left ventricle has normal function. The left ventricle has no regional wall motion abnormalities. The left ventricular internal cavity size was normal in size. There is  no left ventricular hypertrophy. Left ventricular diastolic function could not be evaluated due to atrial fibrillation. Left ventricular diastolic function could not be evaluated. Right Ventricle: The right ventricular size is normal. No increase in right ventricular wall thickness. Right ventricular systolic function is normal. There is moderately elevated pulmonary artery systolic pressure. The tricuspid regurgitant velocity is 2.86 m/s, and with an assumed right atrial pressure of 15 mmHg, the estimated right ventricular systolic pressure is 12.8 mmHg. Left Atrium: Left atrial size was normal in size. Right Atrium: Right atrial size was normal in size. Pericardium: There is no evidence of  pericardial effusion. Mitral Valve: The mitral valve is normal in structure. Mild mitral annular calcification. Mild mitral valve regurgitation. No evidence of mitral valve stenosis. Tricuspid Valve: The tricuspid valve is normal in structure. Tricuspid valve regurgitation is mild . No evidence of tricuspid stenosis. Aortic Valve: The aortic valve is tricuspid. Aortic valve regurgitation is trivial. Aortic valve sclerosis is present, with no evidence of aortic valve stenosis. Aortic valve mean gradient measures 3.0 mmHg. Aortic valve peak gradient measures 6.2 mmHg. Aortic  valve area, by VTI measures 2.32 cm. Pulmonic Valve: The pulmonic valve was normal in structure. Pulmonic valve regurgitation is not visualized. No evidence of pulmonic stenosis. Aorta: Aortic dilatation noted. There is mild dilatation of the aortic root, measuring 39 mm. Venous: The inferior vena cava is normal in size with greater than 50% respiratory variability, suggesting right atrial pressure of 3 mmHg. IAS/Shunts: No atrial level shunt detected by color flow Doppler.  LEFT VENTRICLE PLAX 2D LVIDd:         5.30 cm   Diastology LVIDs:         4.40 cm   LV e' medial:    8.05 cm/s LV PW:         0.90 cm   LV E/e' medial:  13.4 LV IVS:        0.80 cm   LV e' lateral:   9.03 cm/s LVOT diam:     2.00 cm   LV E/e' lateral: 12.0 LV SV:         48 LV SV Index:   21 LVOT Area:     3.14 cm  RIGHT VENTRICLE             IVC RV Basal diam:  3.90 cm     IVC diam: 2.30 cm RV Mid diam:    3.40 cm RV S prime:     11.30 cm/s TAPSE (M-mode): 1.8 cm LEFT ATRIUM             Index        RIGHT ATRIUM           Index LA diam:        4.50 cm 1.93 cm/m   RA Area:     19.50 cm LA Vol (A2C):   76.8 ml 32.94 ml/m  RA Volume:   54.10 ml  23.21 ml/m LA Vol (A4C):   73.4 ml 31.48 ml/m LA Biplane Vol: 77.3 ml 33.16 ml/m  AORTIC VALVE AV Area (Vmax):    2.28 cm AV Area (Vmean):   2.28 cm AV Area (VTI):     2.32 cm AV Vmax:           125.00 cm/s AV Vmean:           85.300 cm/s AV VTI:            0.207 m AV Peak Grad:      6.2 mmHg AV Mean Grad:      3.0 mmHg LVOT Vmax:         90.70 cm/s LVOT Vmean:        62.000 cm/s LVOT VTI:          0.153 m LVOT/AV VTI ratio: 0.74  AORTA Ao Root diam: 3.90 cm Ao Asc diam:  3.50 cm MITRAL VALVE                TRICUSPID VALVE MV Area (PHT): 4.36 cm     TR Peak grad:   32.7 mmHg MV Decel Time: 174 msec     TR Vmax:        286.00 cm/s MV E velocity: 108.00 cm/s                             SHUNTS                             Systemic VTI:  0.15  m                             Systemic Diam: 2.00 cm Fransico Him MD Electronically signed by Fransico Him MD Signature Date/Time: 02/09/2022/3:03:09 PM    Final    ECHO TEE  Result Date: 02/12/2022    TRANSESOPHOGEAL ECHO REPORT   Patient Name:   ALEKSI BRUMMET Date of Exam: 02/11/2022 Medical Rec #:  [id number]      Height:       72.0 in Accession #:    [id number]     Weight:       245.9 lb Date of Birth:  11/04/1966       BSA:          2.326 m Patient Age:    1 years       BP:           141/107 mmHg Patient Gender: M              HR:           139 bpm. Exam Location:  Inpatient Procedure: 2D Echo, Cardiac Doppler and Color Doppler Indications:     Atrial fibrillation                  Pre-ablation evaluation  History:         Patient has prior history of Echocardiogram examinations, most                  recent 02/09/2022. Arrythmias:Atrial Fibrillation; Risk                  Factors:Hypertension, Diabetes and Dyslipidemia.  Sonographer:     Clayton Lefort RDCS (AE) Referring Phys:  [id number] Josue Hector Diagnosing Phys: Sanda Klein MD PROCEDURE: After discussion of the risks and benefits of a TEE, an informed consent was obtained from the patient. The transesophogeal probe was passed without difficulty through the esophogus of the patient. Local oropharyngeal anesthetic was provided with Benzocaine spray. Sedation performed by performing physician. Patients was under conscious sedation during this  procedure. Anesthetic administered: 156mcg of Fentanyl, 6mg  of Versed. Image quality was good. The patient developed no complications during the procedure. IMPRESSIONS  1. Left ventricular ejection fraction, by estimation, is 35 to 40%. The left ventricle has moderately decreased function. The left ventricle demonstrates global hypokinesis. There is mild concentric left ventricular hypertrophy.  2. Right ventricular systolic function is mildly reduced. The right ventricular size is mildly enlarged. Tricuspid regurgitation signal is inadequate for assessing PA pressure.  3. Left atrial size was mild to moderately dilated. No left atrial/left atrial appendage thrombus was detected.  4. Right atrial size was mildly dilated.  5. The mitral valve is normal in structure. Trivial mitral valve regurgitation.  6. The aortic valve is tricuspid. There is mild calcification of the aortic valve. Aortic valve regurgitation is mild. Aortic valve sclerosis is present, with no evidence of aortic valve stenosis. Comparison(s): LV function has deteriorated (study performed during AFlutter with ventricular rate 140 bpm). FINDINGS  Left Ventricle: Left ventricular ejection fraction, by estimation, is 35 to 40%. The left ventricle has moderately decreased function. The left ventricle demonstrates global hypokinesis. The left ventricular internal cavity size was normal in size. There is mild concentric left ventricular hypertrophy. Right Ventricle: The right ventricular size is mildly enlarged. No increase in right ventricular wall thickness. Right ventricular systolic function is mildly reduced. Tricuspid  regurgitation signal is inadequate for assessing PA pressure. Left Atrium: Left atrial size was mild to moderately dilated. No left atrial/left atrial appendage thrombus was detected. Right Atrium: Right atrial size was mildly dilated. Pericardium: There is no evidence of pericardial effusion. Mitral Valve: The mitral valve is normal in  structure. Trivial mitral valve regurgitation. Tricuspid Valve: The tricuspid valve is normal in structure. Tricuspid valve regurgitation is mild. Aortic Valve: The aortic valve is tricuspid. There is mild calcification of the aortic valve. Aortic valve regurgitation is mild. Aortic valve sclerosis is present, with no evidence of aortic valve stenosis. Pulmonic Valve: The pulmonic valve was normal in structure. Pulmonic valve regurgitation is not visualized. Aorta: The aortic root, ascending aorta, aortic arch and descending aorta are all structurally normal, with no evidence of dilitation or obstruction. IAS/Shunts: No atrial level shunt detected by color flow Doppler.   AORTA Ao Root diam: 3.86 cm Ao Asc diam:  3.59 cm Dani Gobble Croitoru MD Electronically signed by Sanda Klein MD Signature Date/Time: 02/12/2022/11:04:07 AM    Final     Microbiology: Recent Results (from the past 240 hour(s))  Resp Panel by RT-PCR (Flu A&B, Covid) Nasopharyngeal Swab     Status: None   Collection Time: 02/08/22  5:24 PM   Specimen: Nasopharyngeal Swab; Nasopharyngeal(NP) swabs in vial transport medium  Result Value Ref Range Status   SARS Coronavirus 2 by RT PCR NEGATIVE NEGATIVE Final    Comment: (NOTE) SARS-CoV-2 target nucleic acids are NOT DETECTED.  The SARS-CoV-2 RNA is generally detectable in upper respiratory specimens during the acute phase of infection. The lowest concentration of SARS-CoV-2 viral copies this assay can detect is 138 copies/mL. A negative result does not preclude SARS-Cov-2 infection and should not be used as the sole basis for treatment or other patient management decisions. A negative result may occur with  improper specimen collection/handling, submission of specimen other than nasopharyngeal swab, presence of viral mutation(s) within the areas targeted by this assay, and inadequate number of viral copies(<138 copies/mL). A negative result must be combined with clinical observations,  patient history, and epidemiological information. The expected result is Negative.  Fact Sheet for Patients:  EntrepreneurPulse.com.au  Fact Sheet for Healthcare Providers:  IncredibleEmployment.be  This test is no t yet approved or cleared by the [place] FDA and  has been authorized for detection and/or diagnosis of SARS-CoV-2 by FDA under an Emergency Use Authorization (EUA). This EUA will remain  in effect (meaning this test can be used) for the duration of the COVID-19 declaration under Section 564(b)(1) of the Act, 21 U.S.C.section 360bbb-3(b)(1), unless the authorization is terminated  or revoked sooner.       Influenza A by PCR NEGATIVE NEGATIVE Final   Influenza B by PCR NEGATIVE NEGATIVE Final    Comment: (NOTE) The Xpert Xpress SARS-CoV-2/FLU/RSV plus assay is intended as an aid in the diagnosis of influenza from Nasopharyngeal swab specimens and should not be used as a sole basis for treatment. Nasal washings and aspirates are unacceptable for Xpert Xpress SARS-CoV-2/FLU/RSV testing.  Fact Sheet for Patients: EntrepreneurPulse.com.au  Fact Sheet for Healthcare Providers: IncredibleEmployment.be  This test is not yet approved or cleared by the [place] FDA and has been authorized for detection and/or diagnosis of SARS-CoV-2 by FDA under an Emergency Use Authorization (EUA). This EUA will remain in effect (meaning this test can be used) for the duration of the COVID-19 declaration under Section 564(b)(1) of the Act, 21 U.S.C. section 360bbb-3(b)(1), unless the authorization is  terminated or revoked.  Performed at [facility], [address], [place], [place] [address]      Labs: Basic Metabolic Panel: Recent Labs  Lab 02/11/22 0352 02/15/22 1358  NA 137 139  K 3.9 3.9  CL 104 104  CO2 20* 27  GLUCOSE 90 164*  BUN 14 14  CREATININE 1.33* 1.18   CALCIUM 8.4* 9.1   Liver Function Tests: No results for input(s): AST, ALT, ALKPHOS, BILITOT, PROT, ALBUMIN in the last 168 hours. No results for input(s): LIPASE, AMYLASE in the last 168 hours. No results for input(s): AMMONIA in the last 168 hours. CBC: Recent Labs  Lab 02/15/22 1358  WBC 4.3  NEUTROABS 2.7  HGB 14.5  HCT 45.5  MCV 83.5  PLT 221   Cardiac Enzymes: No results for input(s): CKTOTAL, CKMB, CKMBINDEX, TROPONINI in the last 168 hours. BNP: BNP (last 3 results) Recent Labs    12/24/21 1953 02/08/22 1157  BNP 217.4* 270.2*    ProBNP (last 3 results) No results for input(s): PROBNP in the last 8760 hours.  CBG: Recent Labs  Lab 02/11/22 0800 02/11/22 1243 02/11/22 1611 02/11/22 2132 02/12/22 0736  GLUCAP 110* 111* 177* 138* 162*       Signed:  Domenic Polite MD.  [facility] 02/16/2022, 2:18 PM

## 2022-02-21 NOTE — Telephone Encounter (Signed)
Called pt and got him scheduled for 3/8 @1220 

## 2022-02-26 ENCOUNTER — Ambulatory Visit (HOSPITAL_COMMUNITY): Payer: BC Managed Care – PPO | Admitting: Physician Assistant

## 2022-02-27 ENCOUNTER — Other Ambulatory Visit: Payer: Self-pay | Admitting: Family Medicine

## 2022-02-27 DIAGNOSIS — I10 Essential (primary) hypertension: Secondary | ICD-10-CM

## 2022-02-28 ENCOUNTER — Telehealth: Payer: Self-pay

## 2022-02-28 ENCOUNTER — Other Ambulatory Visit: Payer: Self-pay

## 2022-02-28 DIAGNOSIS — I10 Essential (primary) hypertension: Secondary | ICD-10-CM

## 2022-02-28 MED ORDER — LOSARTAN POTASSIUM 100 MG PO TABS: 100.0000 mg | ORAL_TABLET | Freq: Every day | ORAL | 0 refills | Status: DC

## 2022-02-28 NOTE — Telephone Encounter (Signed)
10 day refill sent to [facility] on [address].  ?

## 2022-02-28 NOTE — Telephone Encounter (Signed)
Patient called states he is at pharmacy and needs refill of his Losartan. He would like refill to last until his appointments next week. Advised patient that I will send message but he has been informed several times as documented in chart that he needs to be seen for refill and has cancelled several appointments. States that he does not want to go back to urgent care like he did last time to have refill.  ?

## 2022-03-03 ENCOUNTER — Other Ambulatory Visit: Payer: Self-pay | Admitting: Family Medicine

## 2022-03-03 DIAGNOSIS — E1169 Type 2 diabetes mellitus with other specified complication: Secondary | ICD-10-CM

## 2022-03-05 ENCOUNTER — Other Ambulatory Visit: Payer: Self-pay

## 2022-03-05 ENCOUNTER — Encounter (HOSPITAL_COMMUNITY): Payer: Self-pay | Admitting: Physician Assistant

## 2022-03-05 ENCOUNTER — Ambulatory Visit: Payer: BC Managed Care – PPO | Admitting: Family Medicine

## 2022-03-05 ENCOUNTER — Ambulatory Visit (HOSPITAL_COMMUNITY)
Admission: RE | Admit: 2022-03-05 | Discharge: 2022-03-05 | Disposition: A | Discharge status: Home / Self Care | Payer: BC Managed Care – PPO | Source: Ambulatory Visit | Attending: Physician Assistant | Admitting: Physician Assistant

## 2022-03-05 VITALS — BP 122/80 | HR 73 | Temp 97.9°F | Ht 72.0 in | Wt 247.4 lb

## 2022-03-05 VITALS — BP 132/80 | HR 73 | Ht 72.0 in | Wt 247.2 lb

## 2022-03-05 DIAGNOSIS — I4891 Unspecified atrial fibrillation: Secondary | ICD-10-CM

## 2022-03-05 DIAGNOSIS — M1712 Unilateral primary osteoarthritis, left knee: Secondary | ICD-10-CM

## 2022-03-05 DIAGNOSIS — Z6833 Body mass index (BMI) 33.0-33.9, adult: Secondary | ICD-10-CM | POA: Insufficient documentation

## 2022-03-05 DIAGNOSIS — G4733 Obstructive sleep apnea (adult) (pediatric): Secondary | ICD-10-CM | POA: Insufficient documentation

## 2022-03-05 DIAGNOSIS — I82409 Acute embolism and thrombosis of unspecified deep veins of unspecified lower extremity: Secondary | ICD-10-CM | POA: Insufficient documentation

## 2022-03-05 DIAGNOSIS — E1169 Type 2 diabetes mellitus with other specified complication: Secondary | ICD-10-CM | POA: Diagnosis not present

## 2022-03-05 DIAGNOSIS — E0590 Thyrotoxicosis, unspecified without thyrotoxic crisis or storm: Secondary | ICD-10-CM | POA: Diagnosis not present

## 2022-03-05 DIAGNOSIS — I480 Paroxysmal atrial fibrillation: Secondary | ICD-10-CM | POA: Diagnosis not present

## 2022-03-05 DIAGNOSIS — D6869 Other thrombophilia: Secondary | ICD-10-CM | POA: Diagnosis not present

## 2022-03-05 DIAGNOSIS — Z7901 Long term (current) use of anticoagulants: Secondary | ICD-10-CM | POA: Insufficient documentation

## 2022-03-05 DIAGNOSIS — E785 Hyperlipidemia, unspecified: Secondary | ICD-10-CM

## 2022-03-05 DIAGNOSIS — I10 Essential (primary) hypertension: Secondary | ICD-10-CM | POA: Insufficient documentation

## 2022-03-05 DIAGNOSIS — I5021 Acute systolic (congestive) heart failure: Secondary | ICD-10-CM

## 2022-03-05 DIAGNOSIS — E669 Obesity, unspecified: Secondary | ICD-10-CM | POA: Diagnosis not present

## 2022-03-05 DIAGNOSIS — I4892 Unspecified atrial flutter: Secondary | ICD-10-CM | POA: Insufficient documentation

## 2022-03-05 DIAGNOSIS — I2782 Chronic pulmonary embolism: Secondary | ICD-10-CM

## 2022-03-05 DIAGNOSIS — M1711 Unilateral primary osteoarthritis, right knee: Secondary | ICD-10-CM

## 2022-03-05 MED ORDER — METOPROLOL SUCCINATE ER 50 MG PO TB24: 50.0000 mg | ORAL_TABLET | Freq: Every day | ORAL | 3 refills | Status: DC

## 2022-03-05 MED ORDER — RIVAROXABAN 20 MG PO TABS: 20.0000 mg | ORAL_TABLET | Freq: Every morning | ORAL | 3 refills | Status: DC

## 2022-03-05 MED ORDER — FUROSEMIDE 40 MG PO TABS: 40.0000 mg | ORAL_TABLET | Freq: Every day | ORAL | 3 refills | Status: DC

## 2022-03-05 MED ORDER — LOSARTAN POTASSIUM 100 MG PO TABS: 100.0000 mg | ORAL_TABLET | Freq: Every day | ORAL | 3 refills | Status: DC

## 2022-03-05 MED ORDER — SITAGLIPTIN PHOSPHATE 100 MG PO TABS: 100.0000 mg | ORAL_TABLET | Freq: Every day | ORAL | 3 refills | Status: DC

## 2022-03-05 MED ORDER — ATORVASTATIN CALCIUM 40 MG PO TABS: 40.0000 mg | ORAL_TABLET | Freq: Every day | ORAL | 3 refills | Status: DC

## 2022-03-05 MED ORDER — DAPAGLIFLOZIN PROPANEDIOL 5 MG PO TABS: 5.0000 mg | ORAL_TABLET | Freq: Every day | ORAL | 3 refills | Status: DC

## 2022-03-05 MED ORDER — AMLODIPINE BESYLATE 5 MG PO TABS: 5.0000 mg | ORAL_TABLET | Freq: Every day | ORAL | 3 refills | Status: DC

## 2022-03-05 NOTE — Assessment & Plan Note (Signed)
Status post ablation in regular rhythm today.  Continue metoprolol 50 mg, Xarelto 20 mg.  Follow-up with EP and cardiology as planned ?

## 2022-03-05 NOTE — Assessment & Plan Note (Signed)
Reviewed recent echo, patient is breathing well and stable on exam.  Continue Lasix 40 mg and cardiology follow-up. ?

## 2022-03-05 NOTE — Progress Notes (Signed)
? ?Subjective:  ? ?  ?Spencer English is a 56 y.o. male presenting for Hospitalization Follow-up and Medication Refill ?  ? ? ?Medication Refill ? ? ?#Afib/CHF ?- taking metoprolol and xarelto ?- no issues with rhythm ?- has EP and cardiology f/u ?- DOE has improved since the hospitalization ? 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 ?Review of Systems ? ?2/11-2/15/2023: Admission - Afib - s/p ablation- rivaroxaban and metoprolol f/u with cards. TEE with EF 35% - lasix, losartan, metoprolol. Repeat ECHO in 2-3 months ? ?Social History  ? ?Tobacco Use  ?Smoking Status Some Days  ? Types: Cigars  ?Smokeless Tobacco Never  ? ? ? ?   ?Objective:  ?  ?BP Readings from Last 3 Encounters:  ?03/05/22 122/80  ?02/15/22 130/80  ?02/12/22 (!) 130/92  ? ?Wt Readings from Last 3 Encounters:  ?03/05/22 247 lb 7 oz (112.2 kg)  ?02/15/22 243 lb (110.2 kg)  ?02/11/22 245 lb 14.4 oz (111.5 kg)  ? ? ?BP 122/80   Pulse 73   Temp 97.9 ?F (36.6 ?C) (Oral)   Ht 6' (1.829 m)   Wt 247 lb 7 oz (112.2 kg)   SpO2 98%   BMI 33.56 kg/m?  ? ? ?Physical Exam ?Constitutional:   ?   Appearance: Normal appearance. He is not ill-appearing or diaphoretic.  ?HENT:  ?   Head: Normocephalic and atraumatic.  ?   Right Ear: External ear normal.  ?   Left Ear: External ear normal.  ?Eyes:  ?   General: No scleral icterus. ?   Extraocular Movements: Extraocular movements intact.  ?   Conjunctiva/sclera: Conjunctivae normal.  ?Cardiovascular:  ?   Rate and Rhythm: Normal rate and regular rhythm.  ?   Heart sounds: Murmur heard.  ?Pulmonary:  ?   Effort: Pulmonary effort is normal. No respiratory distress.  ?   Breath sounds: Normal breath sounds. No wheezing.  ?Musculoskeletal:  ?   Cervical back:  Neck supple.  ?   Comments: Enlarged and swollen knees  ?Skin: ?   General: Skin is warm and dry.  ?Neurological:  ?   Mental Status: He is alert. Mental status is at baseline.  ?Psychiatric:     ?   Mood and Affect: Mood normal.  ? ? ? ? ? ?   ?Assessment & Plan:  ? ?Problem List Items Addressed This Visit   ? ?  ? Cardiovascular and Mediastinum  ? Essential hypertension  ?  Blood pressure controlled.  Continue amlodipine 5 mg, losartan 100 mg, metoprolol 50 mg. ?  ?  ? Relevant Medications  ? amLODipine (NORVASC) 5 MG tablet  ? atorvastatin (LIPITOR) 40 MG tablet  ? furosemide (LASIX) 40 MG tablet  ? losartan (COZAAR) 100 MG tablet  ? metoprolol succinate (TOPROL-XL) 50 MG 24 hr tablet  ? rivaroxaban (XARELTO) 20 MG TABS tablet  ? Chronic pulmonary embolism (HCC)  ? Relevant Medications  ? amLODipine (NORVASC) 5 MG tablet  ? atorvastatin (LIPITOR) 40 MG tablet  ? furosemide (LASIX) 40 MG tablet  ? losartan (COZAAR) 100 MG tablet  ? metoprolol succinate (TOPROL-XL) 50 MG 24 hr tablet  ? rivaroxaban (XARELTO) 20 MG TABS  tablet  ? Atrial fibrillation ([place])  ?  Status post ablation in regular rhythm today.  Continue metoprolol 50 mg, Xarelto 20 mg.  Follow-up with EP and cardiology as planned ?  ?  ? Relevant Medications  ? amLODipine (NORVASC) 5 MG tablet  ? atorvastatin (LIPITOR) 40 MG tablet  ? furosemide (LASIX) 40 MG tablet  ? losartan (COZAAR) 100 MG tablet  ? metoprolol succinate (TOPROL-XL) 50 MG 24 hr tablet  ? rivaroxaban (XARELTO) 20 MG TABS tablet  ? Acute clinical systolic heart failure ([place])  ?  Reviewed recent echo, patient is breathing well and stable on exam.  Continue Lasix 40 mg and cardiology follow-up. ?  ?  ? Relevant Medications  ? amLODipine (NORVASC) 5 MG tablet  ? atorvastatin (LIPITOR) 40 MG tablet  ? furosemide (LASIX) 40 MG tablet  ? losartan (COZAAR) 100 MG tablet  ? metoprolol succinate (TOPROL-XL) 50 MG 24 hr tablet  ? rivaroxaban (XARELTO) 20 MG TABS tablet  ?  ? Endocrine  ? Type 2  diabetes mellitus with hyperlipidemia ([place])  ?  Last hemoglobin A1c 6.7 well-controlled.  Continue Farxiga 5 mg, Januvia 100 mg. ?  ?  ? Relevant Medications  ? amLODipine (NORVASC) 5 MG tablet  ? atorvastatin (LIPITOR) 40 MG tablet  ? dapagliflozin propanediol (FARXIGA) 5 MG TABS tablet  ? furosemide (LASIX) 40 MG tablet  ? losartan (COZAAR) 100 MG tablet  ? metoprolol succinate (TOPROL-XL) 50 MG 24 hr tablet  ? rivaroxaban (XARELTO) 20 MG TABS tablet  ? sitaGLIPtin (JANUVIA) 100 MG tablet  ? Hyperlipidemia associated with type 2 diabetes mellitus ([place])  ? Relevant Medications  ? amLODipine (NORVASC) 5 MG tablet  ? atorvastatin (LIPITOR) 40 MG tablet  ? dapagliflozin propanediol (FARXIGA) 5 MG TABS tablet  ? furosemide (LASIX) 40 MG tablet  ? losartan (COZAAR) 100 MG tablet  ? metoprolol succinate (TOPROL-XL) 50 MG 24 hr tablet  ? rivaroxaban (XARELTO) 20 MG TABS tablet  ? sitaGLIPtin (JANUVIA) 100 MG tablet  ?  ? Musculoskeletal and Integument  ? Arthritis of left knee - Primary  ? Relevant Orders  ? Ambulatory referral to Orthopedic Surgery  ? Arthritis of right knee  ?  Patient notes history of severe arthritis in the knees.  Was told he would be a candidate for surgery in the past but was advised to wait.  Has not seen Ortho in 5 years.  Handicap placard for 6 months filled out but strongly encouraged him following up with Ortho to treat his knee pain. ?  ?  ? Relevant Orders  ? Ambulatory referral to Orthopedic Surgery  ? ?Other Visit Diagnoses   ? ? Type 2 diabetes mellitus with other specified complication, without long-term current use of insulin ([place])      ? Relevant Medications  ? atorvastatin (LIPITOR) 40 MG tablet  ? dapagliflozin propanediol (FARXIGA) 5 MG TABS tablet  ? losartan (COZAAR) 100 MG tablet  ? sitaGLIPtin (JANUVIA) 100 MG tablet  ? ?  ? ? ? ?Return in about 6 months (around 09/05/2022) for annual physical . ? ?Lesleigh Noe, MD ? ?This visit occurred during the SARS-CoV-2 public health  emergency.  Safety protocols were in place, including screening questions prior to the visit, additional usage of staff PPE, and extensive cleaning of exam room while observing appropriate contact time as indicated for disinfecting solutions.  ? ?

## 2022-03-05 NOTE — Patient Instructions (Addendum)
#  Referral ?I have placed a referral to a specialist for you. You should receive a phone call from the specialty office. Make sure your voicemail is not full and that if you are able to answer your phone to unknown or new numbers.  ? ?It may take up to 2 weeks to hear about the referral. If you do not hear anything in 2 weeks, please call our office and ask to speak with the referral coordinator.  ? ?Keep appointments with cardiology ? ?Schedule appointment with Endocrine (Dr. Loanne Drilling) ?

## 2022-03-05 NOTE — Progress Notes (Signed)
Primary Care Physician: Lesleigh Noe, MD Primary Electrophysiologist: Dr Rayann Heman Referring Physician: Dr Beverely Low is a 56 y.o. male with a history of HTN, OSA, hyperthyroidism, DVT/PE, atrial flutter, atrial fibrillation who presents for consultation in the [facility].  The patient was initially diagnosed with atrial fibrillation in the setting of hyperthyroid disease remotely. Patient is on Xarelto for a CHADS2VASC score of 3. He presented to the ED on 02/08/22 with dizziness and presyncope and ECG showed atrial flutter. He underwent atrial flutter ablation with Dr Lovena Le on 02/11/22. Patient reports that he has done very well since the procedure. He denies any heart racing or palpitations. He denies CP, swallowing pain, or groin issues.   Today, he denies symptoms of palpitations, chest pain, shortness of breath, orthopnea, PND, lower extremity edema, dizziness, presyncope, syncope, bleeding, or neurologic sequela. The patient is tolerating medications without difficulties and is otherwise without complaint today.    Atrial Fibrillation Risk Factors:  he does have symptoms or diagnosis of sleep apnea. he is compliant with CPAP therapy. he does not have a history of rheumatic fever.   he has a BMI of Body mass index is 33.53 kg/m.Marland Kitchen Filed Weights   03/05/22 1521  Weight: 112.1 kg    Family History  Problem Relation Age of Onset   Diabetes Mother    Hypertension Mother    Hyperlipidemia Mother    Dementia Mother    Diabetes Father    Hypertension Father    Hyperlipidemia Father    Heart attack Father    Heart disease Father    Prostate cancer Father 79   Stomach cancer Neg Hx    Colon cancer Neg Hx    Pancreatic cancer Neg Hx    Esophageal cancer Neg Hx    Rectal cancer Neg Hx      Atrial Fibrillation Management history:  Previous antiarrhythmic drugs: none Previous cardioversions: none Previous ablations:  02/11/22 CHADS2VASC score: 3 Anticoagulation history: warfarin, Xarelto    Past Medical History:  Diagnosis Date   ALLERGIC RHINITIS 04/12/2009   Allergy    Anxiety    Arthritis    Atrial fibrillation ([place])    BACK PAIN, LUMBAR 01/14/2011   DEEP VENOUS THROMBOPHLEBITIS, LEG, LEFT 05/05/2009   DM 08/08/2008   DYSLIPIDEMIA 04/26/2009   ECZEMA 05/23/2010   ERECTILE DYSFUNCTION, 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   GERD (gastroesophageal reflux disease)    GOUT 10/24/2010   Hyperglycemia    HYPERTENSION 07/21/2007   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   Leukopenia    Long term (current) use of anticoagulants 04/03/2011   PULMONARY EMBOLISM 05/03/2009   Sleep apnea    CPAP at bedtime   UNSPECIFIED URINARY CALCULUS    Past Surgical History:  Procedure Laterality Date   A-FLUTTER ABLATION N/A 02/11/2022   Procedure: A-FLUTTER ABLATION;  Surgeon: Evans Lance, MD;  Location: [facility];  Service: Cardiovascular;  Laterality: N/A;   ANTERIOR CRUCIATE LIGAMENT REPAIR  2000   Right   CYSTECTOMY     back of head   KNEE ARTHROSCOPY     left knee   KNEE ARTHROSCOPY  01/03/2013   Procedure: ARTHROSCOPY KNEE;  Surgeon: Yvette Rack., MD;  Location: [facility];  Service: Orthopedics;  Laterality: Left;  Lavage Synovectomy, Removal of loose body   KNEE ARTHROSCOPY W/ ACL RECONSTRUCTION     right knee   TEE WITHOUT CARDIOVERSION N/A 02/11/2022   Procedure: TRANSESOPHAGEAL ECHOCARDIOGRAM (TEE);  Surgeon:  Evans Lance, MD;  Location: [facility];  Service: Cardiovascular;  Laterality: N/A;    Current Outpatient Medications  Medication Sig Dispense Refill   amLODipine (NORVASC) 5 MG tablet Take 1 tablet (5 mg total) by mouth daily. 90 tablet 3   atorvastatin (LIPITOR) 40 MG tablet Take 1 tablet (40 mg total) by mouth daily. 90 tablet 3   dapagliflozin propanediol (FARXIGA) 5 MG TABS tablet Take 1 tablet (5 mg total) by mouth daily. 90 tablet 3   furosemide (LASIX) 40 MG tablet Take 1 tablet (40 mg total) by mouth  daily. 90 tablet 3   glucose blood (CONTOUR NEXT TEST) test strip 1 each by Other route daily. And lancets 1/day 100 each 3   losartan (COZAAR) 100 MG tablet Take 1 tablet (100 mg total) by mouth daily. 90 tablet 3   methimazole (TAPAZOLE) 10 MG tablet Take 1 tablet (10 mg total) by mouth daily. 30 tablet 1   metoprolol succinate (TOPROL-XL) 50 MG 24 hr tablet Take 1 tablet (50 mg total) by mouth daily. 90 tablet 3   polyvinyl alcohol (LIQUIFILM TEARS) 1.4 % ophthalmic solution Place 1 drop into both eyes as needed for dry eyes.     rivaroxaban (XARELTO) 20 MG TABS tablet Take 1 tablet (20 mg total) by mouth every morning. 90 tablet 3   sitaGLIPtin (JANUVIA) 100 MG tablet Take 1 tablet (100 mg total) by mouth daily. 90 tablet 3   No current facility-administered medications for this encounter.    Allergies  Allergen Reactions   Metformin Diarrhea and Nausea Only   Viagra [Sildenafil Citrate] Other (See Comments)    headache    Social History   Socioeconomic History   Marital status: Married    Spouse name: Sharyn Lull   Number of children: 3   Years of education: high school   Highest education level: Not on file  Occupational History   Occupation: Truck Education administrator: [employer]    Employer: [employer]  Tobacco Use   Smoking status: Some Days    Types: Cigars   Smokeless tobacco: Never  Vaping Use   Vaping Use: Never used  Substance and Sexual Activity   Alcohol use: Yes    Alcohol/week: 2.0 - 4.0 standard drinks    Types: 2 - 4 Cans of beer per week    Comment: 2-24oz beers weekends only 03/05/22   Drug use: No   Sexual activity: Not on file  Other Topics Concern   Not on file  Social History Narrative   11/01/20   From: the area   Living: with wife, Sharyn Lull (1990)   Work: truck Geophysicist/field seismologist - concrete      Family: 3 adult children - [place], [place], Jonnie Kind      Enjoys: play basketball, fish, spend time with family      Exercise: stationary  bike - rare use   Diet: grilled and baked food      Safety   Seat belts: Yes    Guns: No   Safe in relationships: Yes    Social Determinants of Radio broadcast assistant Strain: Not on file  Food Insecurity: Not on file  Transportation Needs: Not on file  Physical Activity: Not on file  Stress: Not on file  Social Connections: Not on file  Intimate Partner Violence: Not on file     ROS- All systems are reviewed and negative except as per the HPI above.  Physical  Exam: Vitals:   03/05/22 1521  BP: 132/80  Pulse: 73  Weight: 112.1 kg  Height: 6' (1.829 m)    GEN- The patient is a well appearing obese male, alert and oriented x 3 today.   Head- normocephalic, atraumatic Eyes-  Sclera clear, conjunctiva pink Ears- hearing intact Oropharynx- clear Neck- supple  Lungs- Clear to ausculation bilaterally, normal work of breathing Heart- Regular rate and rhythm, no murmurs, rubs or gallops  GI- soft, NT, ND, + BS Extremities- no clubbing, cyanosis, or edema MS- no significant deformity or atrophy Skin- no rash or lesion Psych- euthymic mood, full affect Neuro- strength and sensation are intact  Wt Readings from Last 3 Encounters:  03/05/22 112.1 kg  03/05/22 112.2 kg  02/15/22 110.2 kg    EKG today demonstrates  SR, T wave changes similar to previous Vent. rate 73 BPM PR interval 128 ms QRS duration 76 ms QT/QTcB 386/425 ms  Echo 02/09/22 demonstrated   1. Left ventricular ejection fraction, by estimation, is 55 to 60%. The  left ventricle has normal function. The left ventricle has no regional  wall motion abnormalities. Left ventricular diastolic function could not  be evaluated.   2. Right ventricular systolic function is normal. The right ventricular  size is normal. There is moderately elevated pulmonary artery systolic  pressure. The estimated right ventricular systolic pressure is 25.8 mmHg.   3. The mitral valve is normal in structure. Mild mitral  valve  regurgitation. No evidence of mitral stenosis.   4. The aortic valve is tricuspid. Aortic valve regurgitation is trivial.  Aortic valve sclerosis is present, with no evidence of aortic valve  stenosis. Aortic valve area, by VTI measures 2.32 cm. Aortic valve mean  gradient measures 3.0 mmHg. Aortic  valve Vmax measures 1.25 m/s.   5. Aortic dilatation noted. There is mild dilatation of the aortic root,  measuring 39 mm.   6. The inferior vena cava is normal in size with greater than 50%  respiratory variability, suggesting right atrial pressure of 3 mmHg.   Epic records are reviewed at length today  CHA2DS2-VASc Score = 3  The patient's score is based upon: CHF History: 1 HTN History: 1 Diabetes History: 1 Stroke History: 0 Vascular Disease History: 0 Age Score: 0 Gender Score: 0       ASSESSMENT AND PLAN: 1. Paroxysmal Atrial Fibrillation/atrial flutter The patient's CHA2DS2-VASc score is 3, indicating a 3.2% annual risk of stroke.   S/p atrial flutter ablation 02/12/22 Continue Xarelto 20 mg daily (also taking for h/o DVT/PE) Continue Toprol 50 mg daily  2. Secondary Hypercoagulable State (ICD10:  D68.69) The patient is at significant risk for stroke/thromboembolism based upon his CHA2DS2-VASc Score of 3.  Continue Rivaroxaban (Xarelto).   3. Obesity Body mass index is 33.53 kg/m. Lifestyle modification was discussed at length including regular exercise and weight reduction.  4. Systolic dysfunction EF 52-77% on TEE Suspected related to atrial flutter. Plan to recheck echo 3-4 months post ablation. Appears euvolemic today.  5. OSA The importance of adequate treatment of sleep apnea was discussed today in order to improve our ability to maintain sinus rhythm long term. Encouraged compliance with CPAP therapy.   Follow up with Dr Lovena Le as scheduled.    [facility] [address] [place], [place]  [address] [phone] 03/05/2022 3:47 PM

## 2022-03-05 NOTE — Assessment & Plan Note (Signed)
Patient notes history of severe arthritis in the knees.  Was told he would be a candidate for surgery in the past but was advised to wait.  Has not seen Ortho in 5 years.  Handicap placard for 6 months filled out but strongly encouraged him following up with Ortho to treat his knee pain. ?

## 2022-03-05 NOTE — Assessment & Plan Note (Signed)
Blood pressure controlled.  Continue amlodipine 5 mg, losartan 100 mg, metoprolol 50 mg. ?

## 2022-03-05 NOTE — Assessment & Plan Note (Signed)
Last hemoglobin A1c 6.7 well-controlled.  Continue Farxiga 5 mg, Januvia 100 mg. ?

## 2022-03-07 ENCOUNTER — Other Ambulatory Visit: Payer: Self-pay | Admitting: Family Medicine

## 2022-03-07 DIAGNOSIS — I10 Essential (primary) hypertension: Secondary | ICD-10-CM

## 2022-03-21 ENCOUNTER — Other Ambulatory Visit: Payer: Self-pay

## 2022-03-21 ENCOUNTER — Ambulatory Visit (INDEPENDENT_AMBULATORY_CARE_PROVIDER_SITE_OTHER): Payer: BC Managed Care – PPO | Admitting: Internal Medicine

## 2022-03-21 ENCOUNTER — Encounter: Payer: Self-pay | Admitting: Internal Medicine

## 2022-03-21 VITALS — BP 164/96 | HR 67 | Ht 72.0 in | Wt 246.4 lb

## 2022-03-21 DIAGNOSIS — I4892 Unspecified atrial flutter: Secondary | ICD-10-CM | POA: Diagnosis not present

## 2022-03-21 DIAGNOSIS — I480 Paroxysmal atrial fibrillation: Secondary | ICD-10-CM

## 2022-03-21 DIAGNOSIS — I43 Cardiomyopathy in diseases classified elsewhere: Secondary | ICD-10-CM

## 2022-03-21 DIAGNOSIS — R000 Tachycardia, unspecified: Secondary | ICD-10-CM | POA: Diagnosis not present

## 2022-03-21 NOTE — Patient Instructions (Addendum)
Medication Instructions:  Your physician recommends that you continue on your current medications as directed. Please refer to the Current Medication list given to you today.  Labwork: None ordered.  Testing/Procedures: None ordered.  Follow-Up: Your physician wants you to follow-up in: as needed with Gregg Taylor, MD    Any Other Special Instructions Will Be Listed Below (If Applicable).  If you need a refill on your cardiac medications before your next appointment, please call your pharmacy.       

## 2022-03-21 NOTE — Progress Notes (Signed)
? ? ? ? ?HPI ?Spencer English returns today for followup. He has a h/o hyperthyroidism and atrial fib, and then after treatment developed atrial flutter despite being euthyroid. He underwent EP study and catheter ablation a month ago. He has done well in the interim. He notes occaisional episodes of brief palpitations for seconds. He denies chest pain or sob. 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  ?Allergies  ?Allergen Reactions  ? Metformin Diarrhea and Nausea Only  ? Viagra [Sildenafil Citrate] Other (See Comments)  ?  headache  ? ? ? ?Current Outpatient Medications  ?Medication Sig Dispense Refill  ? amLODipine (NORVASC) 5 MG tablet Take 1 tablet (5 mg total) by mouth daily. 90 tablet 3  ? atorvastatin (LIPITOR) 40 MG tablet Take 1 tablet (40 mg total) by mouth daily. 90 tablet 3  ? dapagliflozin propanediol (FARXIGA) 5 MG TABS tablet Take 1 tablet (5 mg total) by mouth daily. 90 tablet 3  ? furosemide (LASIX) 40 MG tablet Take 1 tablet (40 mg total) by mouth daily. 90 tablet 3  ? glucose blood (CONTOUR NEXT TEST) test strip 1 each by Other route daily. And lancets 1/day 100 each 3  ? losartan (COZAAR) 100 MG tablet Take 1 tablet (100 mg total) by mouth daily. 90 tablet 3  ? methimazole (TAPAZOLE) 10 MG tablet Take 1 tablet (10 mg total) by mouth daily. 30 tablet 1  ? metoprolol succinate (TOPROL-XL) 50 MG 24 hr tablet Take 1 tablet (50 mg total) by mouth daily. 90 tablet 3  ? polyvinyl alcohol (LIQUIFILM TEARS) 1.4 % ophthalmic solution Place 1 drop into both eyes as needed for dry eyes.    ? rivaroxaban (XARELTO) 20 MG TABS tablet Take 1 tablet (20 mg total) by mouth every morning. 90 tablet 3  ? sitaGLIPtin (JANUVIA) 100 MG tablet Take 1 tablet (100 mg total) by mouth daily. 90 tablet 3  ? ?No current facility-administered medications for this visit.  ? ? ? ?Past Medical History:  ?Diagnosis Date  ? ALLERGIC RHINITIS 04/12/2009  ? Allergy   ? Anxiety   ? Arthritis   ? Atrial fibrillation ([place])   ?  BACK PAIN, LUMBAR 01/14/2011  ? DEEP VENOUS THROMBOPHLEBITIS, LEG, LEFT 05/05/2009  ? DM 08/08/2008  ? DYSLIPIDEMIA 04/26/2009  ? ECZEMA 05/23/2010  ? 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 GERD (gastroesophageal reflux disease)   ? GOUT 10/24/2010  ? Hyperglycemia   ? HYPERTENSION 07/21/2007  ? 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  ? Leukopenia   ? Long term (current) use of anticoagulants 04/03/2011  ? PULMONARY EMBOLISM 05/03/2009  ? Sleep apnea   ? CPAP at bedtime  ? UNSPECIFIED URINARY CALCULUS   ? ? ?ROS: ? ? All systems reviewed and negative except as noted in the HPI. ? ? ?Past Surgical History:  ?Procedure Laterality Date  ? A-FLUTTER ABLATION N/A 02/11/2022  ? Procedure: A-FLUTTER ABLATION;  Surgeon: Evans Lance, MD;  Location: [facility];  Service: Cardiovascular;  Laterality: N/A;  ? ANTERIOR CRUCIATE LIGAMENT REPAIR  2000  ? Right  ? CYSTECTOMY    ? back of head  ? KNEE ARTHROSCOPY    ? left knee  ? KNEE ARTHROSCOPY  01/03/2013  ? Procedure: ARTHROSCOPY KNEE;  Surgeon: Yvette Rack., MD;  Location: [facility];  Service: Orthopedics;  Laterality: Left;  Lavage Synovectomy, Removal of loose body  ? KNEE ARTHROSCOPY W/ ACL RECONSTRUCTION    ? right knee  ? TEE WITHOUT CARDIOVERSION N/A  02/11/2022  ? Procedure: TRANSESOPHAGEAL ECHOCARDIOGRAM (TEE);  Surgeon: Evans Lance, MD;  Location: [facility];  Service: Cardiovascular;  Laterality: N/A;  ? ? ? ?Family History  ?Problem Relation Age of Onset  ? Diabetes Mother   ? Hypertension Mother   ? Hyperlipidemia Mother   ? Dementia Mother   ? Diabetes Father   ? Hypertension Father   ? Hyperlipidemia Father   ? Heart attack Father   ? Heart disease Father   ? Prostate cancer Father 53  ? Stomach cancer Neg Hx   ? Colon cancer Neg Hx   ? Pancreatic cancer Neg Hx   ? Esophageal cancer Neg Hx   ? Rectal cancer Neg Hx   ? ? ? ?Social History  ? ?Socioeconomic History  ? Marital status: Married  ?  Spouse name: Spencer English  ? Number of children: 3  ? Years of education: high  school  ? Highest education level: Not on file  ?Occupational History  ? Occupation: Administrator  ?  Employer: [employer]  ?  Employer: [employer]  ?Tobacco Use  ? Smoking status: Some Days  ?  Types: Cigars  ? Smokeless tobacco: Never  ?Vaping Use  ? Vaping Use: Never used  ?Substance and Sexual Activity  ? Alcohol use: Yes  ?  Alcohol/week: 2.0 - 4.0 standard drinks  ?  Types: 2 - 4 Cans of beer per week  ?  Comment: 2-24oz beers weekends only 03/05/22  ? Drug use: No  ? Sexual activity: Not on file  ?Other Topics Concern  ? Not on file  ?Social History Narrative  ? 11/01/20  ? From: [place] area  ? Living: with wife, Spencer English [phone])  ? Work: truck Geophysicist/field seismologist - concrete  ?   ? Family: 3 adult children - Spencer English, Spencer English  ?   ? Enjoys: play basketball, fish, spend time with family  ?   ? Exercise: stationary bike - rare use  ? Diet: grilled and baked food  ?   ? Safety  ? Seat belts: Yes   ? Guns: No  ? Safe in relationships: Yes   ? ?Social Determinants of Health  ? ?Financial Resource Strain: Not on file  ?Food Insecurity: Not on file  ?Transportation Needs: Not on file  ?Physical Activity: Not on file  ?Stress: Not on file  ?Social Connections: Not on file  ?Intimate Partner Violence: Not on file  ? ? ? ?BP (!) 164/96   Pulse 67   Ht 6' (1.829 m)   Wt 246 lb 6.4 oz (111.8 kg)   SpO2 97%   BMI 33.42 kg/m?  ? ?Physical Exam: ? ?Well appearing NAD ?HEENT: Unremarkable ?Neck:  No JVD, no thyromegally ?Lymphatics:  No adenopathy ?Back:  No CVA tenderness ?Lungs:  Clear with no wheezes ?HEART:  Regular rate rhythm, no murmurs, no rubs, no clicks ?Abd:  soft, positive bowel sounds, no organomegally, no rebound, no guarding ?Ext:  2 plus pulses, no edema, no cyanosis, no clubbing ?Skin:  No rashes no nodules ?Neuro:  CN II through XII intact, motor grossly intact ? ?EKG - nsr ? ? ?Assess/Plan:  ?Atrial flutter - he is s/p EPS/RFA and maintaining NSR. He will undergo watchful waiting. ?Coags -  I would stop his xarelto but he has a h/o chronic and recurrent DVT. Continue xarelto. ?HTN - he notes that his bp is better at home. He will maintain a low sodium diet. ?Atrial fib -  this is associated with hyperthyroidism. We will follow. ? ?Spencer Overlie Malaka Ruffner,MD ?

## 2022-05-28 ENCOUNTER — Other Ambulatory Visit: Payer: Self-pay | Admitting: Family Medicine

## 2022-05-28 DIAGNOSIS — N529 Male erectile dysfunction, unspecified: Secondary | ICD-10-CM | POA: Diagnosis not present

## 2022-05-28 DIAGNOSIS — N200 Calculus of kidney: Secondary | ICD-10-CM | POA: Diagnosis not present

## 2022-05-28 DIAGNOSIS — Z125 Encounter for screening for malignant neoplasm of prostate: Secondary | ICD-10-CM | POA: Diagnosis not present

## 2022-05-28 DIAGNOSIS — E0590 Thyrotoxicosis, unspecified without thyrotoxic crisis or storm: Secondary | ICD-10-CM

## 2022-05-28 NOTE — Telephone Encounter (Signed)
MEDICATION:methimazole (TAPAZOLE) 10 MG tablet  PHARMACY:[facility] [phone] - [place], [place] - [address]  Comments: Patient is completely out.   **Let patient know to contact pharmacy at the end of the day to make sure medication is ready. **  ** Please notify patient to allow 48-72 hours to process**  **Encourage patient to contact the pharmacy for refills or they can request refills through [facility]**

## 2022-05-29 NOTE — Telephone Encounter (Signed)
Refill provided.   Please see when he last saw endocrine.   I would prefer he at least have a check in with endocrinology.

## 2022-08-06 ENCOUNTER — Ambulatory Visit: Payer: BC Managed Care – PPO | Admitting: Family Medicine

## 2022-08-06 VITALS — BP 130/72 | HR 77 | Temp 97.7°F | Wt 249.4 lb

## 2022-08-06 DIAGNOSIS — K047 Periapical abscess without sinus: Secondary | ICD-10-CM | POA: Insufficient documentation

## 2022-08-06 DIAGNOSIS — I10 Essential (primary) hypertension: Secondary | ICD-10-CM

## 2022-08-06 DIAGNOSIS — M1712 Unilateral primary osteoarthritis, left knee: Secondary | ICD-10-CM | POA: Diagnosis not present

## 2022-08-06 DIAGNOSIS — M1711 Unilateral primary osteoarthritis, right knee: Secondary | ICD-10-CM | POA: Diagnosis not present

## 2022-08-06 DIAGNOSIS — M170 Bilateral primary osteoarthritis of knee: Secondary | ICD-10-CM

## 2022-08-06 DIAGNOSIS — E1169 Type 2 diabetes mellitus with other specified complication: Secondary | ICD-10-CM

## 2022-08-06 DIAGNOSIS — E785 Hyperlipidemia, unspecified: Secondary | ICD-10-CM

## 2022-08-06 DIAGNOSIS — E119 Type 2 diabetes mellitus without complications: Secondary | ICD-10-CM | POA: Diagnosis not present

## 2022-08-06 DIAGNOSIS — N529 Male erectile dysfunction, unspecified: Secondary | ICD-10-CM

## 2022-08-06 DIAGNOSIS — E118 Type 2 diabetes mellitus with unspecified complications: Secondary | ICD-10-CM

## 2022-08-06 LAB — POCT GLYCOSYLATED HEMOGLOBIN (HGB A1C): Hemoglobin A1C: 6.7 % — AB (ref 4.0–5.6)

## 2022-08-06 MED ORDER — AMOXICILLIN-POT CLAVULANATE 875-125 MG PO TABS: 1.0000 | ORAL_TABLET | Freq: Two times a day (BID) | ORAL | 0 refills | Status: AC

## 2022-08-06 NOTE — Assessment & Plan Note (Signed)
Lab Results  Component Value Date   LDLCALC 143 (H) 12/18/2020   He is not fasting today, advised to repeat lipids.  He will schedule follow-up in 3 to 4 months and come fasting to do lipids at that visit.  Continue atorvastatin 40 mg.

## 2022-08-06 NOTE — Assessment & Plan Note (Signed)
Discussed that metoprolol could be playing into this.  His diabetes is well-controlled his blood pressure is well-controlled.  And his overall cardiac risk factors advised following up with urology, he has a urologist and will schedule an appointment.

## 2022-08-06 NOTE — Progress Notes (Signed)
Subjective:     Spencer English is a 56 y.o. male presenting for Dental Pain (Top left side- pain, swelling and draining. Dental appt scheduled for 08/13/22)     Dental Pain  Pertinent negatives include no fever or sinus pressure.    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 Negative for fever.  HENT:  Positive for sneezing. Negative for congestion and sinus pressure.   Respiratory:  Negative for shortness of breath.      Social History   Tobacco Use  Smoking Status Some Days   Types: Cigars  Smokeless Tobacco Never        Objective:    BP Readings from Last 3 Encounters:  08/06/22 130/72  03/21/22 (!) 164/96  03/05/22 132/80   Wt Readings from Last 3 Encounters:  08/06/22 249 lb 6 oz (113.1 kg)  03/21/22 246 lb 6.4 oz (111.8 kg)  03/05/22 247 lb 3.2 oz (112.1 kg)    BP 130/72   Pulse 77   Temp 97.7 F (36.5 C) (Temporal)   Wt 249 lb 6 oz (113.1 kg)   SpO2 98%   BMI 33.82 kg/m    Physical Exam Constitutional:      Appearance: Normal appearance. He is not ill-appearing or diaphoretic.  HENT:     Right Ear: External ear normal.     Left Ear: External ear normal.     Nose: Nose normal.     Mouth/Throat:     Dentition: Dental tenderness, gingival swelling and dental caries present. No dental abscesses.     Comments: Erythema and dental caries on the left upper mouth. No swelling or abscess.  Eyes:     General: No scleral icterus.    Extraocular Movements:  Extraocular movements intact.     Conjunctiva/sclera: Conjunctivae normal.  Cardiovascular:     Rate and Rhythm: Normal rate and regular rhythm.     Heart sounds: Murmur heard.  Pulmonary:     Effort: Pulmonary effort is normal. No respiratory distress.     Breath sounds: Normal breath sounds. No wheezing.  Musculoskeletal:     Cervical back: Neck supple.  Skin:    General: Skin is warm and dry.  Neurological:     Mental Status: He is alert. Mental status is at baseline.  Psychiatric:        Mood and Affect: Mood normal.           Assessment & Plan:   Problem List Items Addressed This Visit       Cardiovascular and Mediastinum   Essential hypertension    Blood pressure controlled, continue amlodipine 5 mg, losartan 100 mg, metoprolol 50 mg.        Digestive   Dental infection - Primary    Start Augmentin, patient given 7 days.  ER Precautions including worsening infection or fevers or chills.  He has a dental follow-up  appointment in approximately 1 week.      Relevant Medications   amoxicillin-clavulanate (AUGMENTIN) 875-125 MG tablet     Endocrine   DM (diabetes mellitus), type 2 with complications (HCC)    Lab Results  Component Value Date   HGBA1C 6.7 (A) 08/06/2022  Diabetes controlled, will continue Farxiga 5 mg and Januvia 100 mg.  Continue healthy diet.       Hyperlipidemia associated with type 2 diabetes mellitus ([place])    Lab Results  Component Value Date   LDLCALC 143 (H) 12/18/2020  He is not fasting today, advised to repeat lipids.  He will schedule follow-up in 3 to 4 months and come fasting to do lipids at that visit.  Continue atorvastatin 40 mg.        Musculoskeletal and Integument   Arthritis of left knee   Arthritis of right knee    Severe symptoms, handicap placard for 6 months given today.  He has seen Ortho and is considering surgery but is unable to schedule at this time.  Follow-up in 6 months if still unable to see Ortho for  reevaluation.        Other   ERECTILE DYSFUNCTION, ORGANIC    Discussed that metoprolol could be playing into this.  His diabetes is well-controlled his blood pressure is well-controlled.  And his overall cardiac risk factors advised following up with urology, he has a urologist and will schedule an appointment.        Return in about 3 months (around 11/06/2022) for [facility] with [employer].  Lesleigh Noe, MD

## 2022-08-06 NOTE — Assessment & Plan Note (Signed)
Lab Results  Component Value Date   HGBA1C 6.7 (A) 08/06/2022  Diabetes controlled, will continue Farxiga 5 mg and Januvia 100 mg.  Continue healthy diet.

## 2022-08-06 NOTE — Patient Instructions (Addendum)
Dental infection - start antibiotics  Blood pressure - continue medications  Diabetes - Stable - continue [place]  Erectile dysfunction - follow-up with urology   Come fasting to your next visit - so cholesterol

## 2022-08-06 NOTE — Assessment & Plan Note (Signed)
Severe symptoms, handicap placard for 6 months given today.  He has seen Ortho and is considering surgery but is unable to schedule at this time.  Follow-up in 6 months if still unable to see Ortho for reevaluation.

## 2022-08-06 NOTE — Assessment & Plan Note (Signed)
Start Augmentin, patient given 7 days.  ER Precautions including worsening infection or fevers or chills.  He has a dental follow-up appointment in approximately 1 week.

## 2022-08-06 NOTE — Assessment & Plan Note (Signed)
Blood pressure controlled, continue amlodipine 5 mg, losartan 100 mg, metoprolol 50 mg.

## 2022-08-23 ENCOUNTER — Other Ambulatory Visit: Payer: Self-pay | Admitting: Family Medicine

## 2022-08-23 DIAGNOSIS — I10 Essential (primary) hypertension: Secondary | ICD-10-CM

## 2022-08-25 ENCOUNTER — Telehealth: Payer: Self-pay | Admitting: Family Medicine

## 2022-08-25 NOTE — Telephone Encounter (Signed)
Sarah from Dr Denton Lank oral surgery office called asking for recommendations for the Xarelto blood thinner that the patient is taking, he is having a tooth extraction.

## 2022-08-26 NOTE — Telephone Encounter (Signed)
Called patient and verified no recent blood clots.    Recommend the following:   If Low risk bleeding: Hold for 1 day prior to surgery and day of surgery. Restart the next day.   If HIGH risk bleeding: Hold for 2 days before, day of surgery, and 1 day after surgery.

## 2022-08-26 NOTE — Telephone Encounter (Signed)
Printed note out and faxed to dental office.

## 2022-09-04 ENCOUNTER — Other Ambulatory Visit: Payer: Self-pay | Admitting: Family Medicine

## 2022-09-04 DIAGNOSIS — E0590 Thyrotoxicosis, unspecified without thyrotoxic crisis or storm: Secondary | ICD-10-CM

## 2022-09-05 ENCOUNTER — Encounter: Payer: BC Managed Care – PPO | Admitting: Family Medicine

## 2022-11-06 ENCOUNTER — Encounter: Payer: BC Managed Care – PPO | Admitting: Family

## 2023-01-14 ENCOUNTER — Other Ambulatory Visit: Payer: Self-pay | Admitting: Family Medicine

## 2023-01-14 DIAGNOSIS — E0590 Thyrotoxicosis, unspecified without thyrotoxic crisis or storm: Secondary | ICD-10-CM

## 2023-01-14 NOTE — Telephone Encounter (Signed)
Prescription Request  01/14/2023  Is this a "Controlled Substance" medicine? No  LOV: 08/06/2022  What is the name of the medication or equipment? methimazole (TAPAZOLE) 10 MG tablet     Have you contacted your pharmacy to request a refill? Yes   Which pharmacy would you like this sent to?  [facility] [phone] - [place], [address] AT [place] [facility] [address] Phone: [phone] Fax: [phone]     Patient notified that their request is being sent to the clinical staff for review and that they should receive a response within 2 business days.   Please advise at Mobile [phone] (mobile)

## 2023-01-15 NOTE — Telephone Encounter (Signed)
Patient has TOC later this month. Ok to fill as pended patient should have been out weeks ago.

## 2023-01-15 NOTE — Telephone Encounter (Signed)
Patient called in to follow up on this refill request. He stated that he needs this medication as its for his heart.

## 2023-01-16 MED ORDER — METHIMAZOLE 10 MG PO TABS: ORAL_TABLET | ORAL | 2 refills | Status: DC

## 2023-01-26 ENCOUNTER — Encounter: Payer: BC Managed Care – PPO | Admitting: Family

## 2023-02-16 ENCOUNTER — Telehealth: Payer: Self-pay | Admitting: Family Medicine

## 2023-02-16 ENCOUNTER — Encounter: Payer: Self-pay | Admitting: Family Medicine

## 2023-02-16 ENCOUNTER — Encounter: Payer: Self-pay | Admitting: Internal Medicine

## 2023-02-16 ENCOUNTER — Ambulatory Visit: Payer: BC Managed Care – PPO | Admitting: Internal Medicine

## 2023-02-16 VITALS — BP 160/90 | HR 95 | Temp 98.4°F | Ht 72.0 in | Wt 261.0 lb

## 2023-02-16 DIAGNOSIS — E118 Type 2 diabetes mellitus with unspecified complications: Secondary | ICD-10-CM

## 2023-02-16 DIAGNOSIS — M1712 Unilateral primary osteoarthritis, left knee: Secondary | ICD-10-CM

## 2023-02-16 DIAGNOSIS — J0110 Acute frontal sinusitis, unspecified: Secondary | ICD-10-CM | POA: Diagnosis not present

## 2023-02-16 MED ORDER — AMOXICILLIN-POT CLAVULANATE 875-125 MG PO TABS
1.0000 | ORAL_TABLET | Freq: Two times a day (BID) | ORAL | 0 refills | Status: DC
Start: 2023-02-16 — End: 2023-07-22

## 2023-02-16 NOTE — Assessment & Plan Note (Addendum)
This has limited his mobility I renewed his handicapped permit for 6 months Not ready for TKR (can't miss the work)

## 2023-02-16 NOTE — Assessment & Plan Note (Signed)
Seems to be controlled on januvia and farxiga

## 2023-02-16 NOTE — Telephone Encounter (Signed)
Prescription Request  02/16/2023  Is this a "Controlled Substance" medicine? No  LOV: 08/06/2022  What is the name of the medication or equipment? methimazole (TAPAZOLE) 10 MG tablet   Have you contacted your pharmacy to request a refill? Yes   Which pharmacy would you like this sent to?  [facility] [phone] - [place], [address] AT [place] [address] [address] [facility] [address] Phone: [phone] Fax: [phone]     Patient notified that their request is being sent to the clinical staff for review and that they should receive a response within 2 business days.   Please advise at Mobile [phone] (mobile)

## 2023-02-16 NOTE — Assessment & Plan Note (Addendum)
1 week of symptoms and worsening Will try augmentin 875 bid x 7 days Analgesics On allegra and flonase

## 2023-02-16 NOTE — Progress Notes (Signed)
Subjective:    Patient ID: Spencer English, male    DOB: 09-06-1966, 57 y.o.   MRN: [id number]  HPI Here due to respiratory symptoms  Started about a week ago Felt funny--nasal drainage and post nasal drip In last couple of days having mucus into chest----was white and now is yellow Ricola helps the cough slightly Nose is burning Frontal headache--like sinus pressure COVID test was negative Does get recurrent sinus infections   No fever but has had some sweats at night (but not new) No SOB  Current Outpatient Medications on File Prior to Visit  Medication Sig Dispense Refill   amLODipine (NORVASC) 5 MG tablet Take 1 tablet (5 mg total) by mouth daily. 90 tablet 3   atorvastatin (LIPITOR) 40 MG tablet Take 1 tablet (40 mg total) by mouth daily. 90 tablet 3   dapagliflozin propanediol (FARXIGA) 5 MG TABS tablet Take 1 tablet (5 mg total) by mouth daily. 90 tablet 3   furosemide (LASIX) 40 MG tablet Take 1 tablet (40 mg total) by mouth daily. 90 tablet 3   glucose blood (CONTOUR NEXT TEST) test strip 1 each by Other route daily. And lancets 1/day 100 each 3   losartan (COZAAR) 100 MG tablet Take 1 tablet (100 mg total) by mouth daily. 90 tablet 3   methimazole (TAPAZOLE) 10 MG tablet TAKE 1 TABLET(10 MG) BY MOUTH DAILY 30 tablet 2   metoprolol succinate (TOPROL-XL) 50 MG 24 hr tablet Take 1 tablet (50 mg total) by mouth daily. 90 tablet 3   polyvinyl alcohol (LIQUIFILM TEARS) 1.4 % ophthalmic solution Place 1 drop into both eyes as needed for dry eyes.     rivaroxaban (XARELTO) 20 MG TABS tablet Take 1 tablet (20 mg total) by mouth every morning. 90 tablet 3   sitaGLIPtin (JANUVIA) 100 MG tablet Take 1 tablet (100 mg total) by mouth daily. 90 tablet 3   No current facility-administered medications on file prior to visit.    Allergies  Allergen Reactions   Metformin Diarrhea and Nausea Only   Viagra [Sildenafil Citrate] Other (See Comments)    headache    Past Medical History:   Diagnosis Date   ALLERGIC RHINITIS 04/12/2009   Allergy    Anxiety    Arthritis    Atrial fibrillation ([place])    BACK PAIN, LUMBAR 01/14/2011   DEEP VENOUS THROMBOPHLEBITIS, LEG, LEFT 05/05/2009   DM 08/08/2008   DYSLIPIDEMIA 04/26/2009   ECZEMA 05/23/2010   ERECTILE DYSFUNCTION, 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   GERD (gastroesophageal reflux disease)    GOUT 10/24/2010   Hyperglycemia    HYPERTENSION 07/21/2007   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   Leukopenia    Long term (current) use of anticoagulants 04/03/2011   PULMONARY EMBOLISM 05/03/2009   Sleep apnea    CPAP at bedtime   UNSPECIFIED URINARY CALCULUS     Past Surgical History:  Procedure Laterality Date   A-FLUTTER ABLATION N/A 02/11/2022   Procedure: A-FLUTTER ABLATION;  Surgeon: Evans Lance, MD;  Location: [facility];  Service: Cardiovascular;  Laterality: N/A;   ANTERIOR CRUCIATE LIGAMENT REPAIR  2000   Right   CYSTECTOMY     back of head   KNEE ARTHROSCOPY     left knee   KNEE ARTHROSCOPY  01/03/2013   Procedure: ARTHROSCOPY KNEE;  Surgeon: Yvette Rack., MD;  Location: [facility];  Service: Orthopedics;  Laterality: Left;  Lavage Synovectomy, Removal of loose body   KNEE ARTHROSCOPY W/ ACL RECONSTRUCTION  right knee   TEE WITHOUT CARDIOVERSION N/A 02/11/2022   Procedure: TRANSESOPHAGEAL ECHOCARDIOGRAM (TEE);  Surgeon: Evans Lance, MD;  Location: [facility];  Service: Cardiovascular;  Laterality: N/A;    Family History  Problem Relation Age of Onset   Diabetes Mother    Hypertension Mother    Hyperlipidemia Mother    Dementia Mother    Diabetes Father    Hypertension Father    Hyperlipidemia Father    Heart attack Father    Heart disease Father    Prostate cancer Father 11   Stomach cancer Neg Hx    Colon cancer Neg Hx    Pancreatic cancer Neg Hx    Esophageal cancer Neg Hx    Rectal cancer Neg Hx     Social History   Socioeconomic History   Marital status: Married    Spouse name: Sharyn Lull   Number of  children: 3   Years of education: high school   Highest education level: Not on file  Occupational History   Occupation: Truck Education administrator: [employer]    Employer: [employer]  Tobacco Use   Smoking status: Some Days    Types: Cigars   Smokeless tobacco: Never  Vaping Use   Vaping Use: Never used  Substance and Sexual Activity   Alcohol use: Yes    Alcohol/week: 2.0 - 4.0 standard drinks of alcohol    Types: 2 - 4 Cans of beer per week    Comment: 2-24oz beers weekends only 03/05/22   Drug use: No   Sexual activity: Not on file  Other Topics Concern   Not on file  Social History Narrative   11/01/20   From: the area   Living: with wife, Sharyn Lull [phone])   Work: truck Geophysicist/field seismologist - concrete      Family: 3 adult children - [place], [place], Jonnie Kind      Enjoys: play basketball, fish, spend time with family      Exercise: stationary bike - rare use   Diet: grilled and baked food      Safety   Seat belts: Yes    Guns: No   Safe in relationships: Yes    Social Determinants of Radio broadcast assistant Strain: Not on file  Food Insecurity: Not on file  Transportation Needs: Not on file  Physical Activity: Not on file  Stress: Not on file  Social Connections: Not on file  Intimate Partner Violence: Not on file   Review of Systems Sleeps with CPAP--okay No loss of smell or taste Appetite is good No N/V Sugars have been okay    Objective:   Physical Exam Constitutional:      Appearance: Normal appearance.  HENT:     Head:     Comments: Frontal tenderness    Right Ear: Tympanic membrane and ear canal normal.     Left Ear: Tympanic membrane and ear canal normal.     Nose:     Comments: Mild inflammation    Mouth/Throat:     Comments: Mild pharyngeal injection without exudates Pulmonary:     Effort: Pulmonary effort is normal.     Breath sounds: Normal breath sounds. No wheezing or rales.  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            Assessment & Plan:

## 2023-02-18 NOTE — Telephone Encounter (Signed)
This medication is not a primary care medication it is an endocrinology medication. Is pt seeing endo? We do not typically manage hyperthyroid.

## 2023-02-18 NOTE — Telephone Encounter (Signed)
Spoke with the patient and was advised that Dr Einar Pheasant was sending in his Methimazole because he wasn't seeing his Endo regular. He said Dr Einar Pheasant was managing his medications for him.

## 2023-02-19 ENCOUNTER — Other Ambulatory Visit: Payer: Self-pay | Admitting: Family

## 2023-02-19 DIAGNOSIS — I4891 Unspecified atrial fibrillation: Secondary | ICD-10-CM

## 2023-02-19 DIAGNOSIS — E0590 Thyrotoxicosis, unspecified without thyrotoxic crisis or storm: Secondary | ICD-10-CM

## 2023-02-19 NOTE — Telephone Encounter (Signed)
Understandable.  However this is not something I treat (and most PCPs do not treat within office) and so I can place referral for endocrinology for them to continue ongoing care and management. Pt should really be seeing endo especially since upon review of his chart he has afib as a result of hyperthyroidism. He might need another modality for treatment.   Also oddly, I do not see a recent TSH. Last thyroid level checked was 2/23. This requires more routine testing than once yearly.

## 2023-02-19 NOTE — Progress Notes (Unsigned)
Amb ref

## 2023-02-19 NOTE — Telephone Encounter (Signed)
Spoke with the patient and advised as instructed. Pt understood and will reach out to his current Endocrinologist (only see yearly) and see if he could get a prescription sent.

## 2023-03-04 ENCOUNTER — Telehealth: Payer: Self-pay

## 2023-03-04 NOTE — Telephone Encounter (Signed)
Spoke with the patient and advised that he should be hearing from Endo to schedule an appt, as they were trying to reach out to him. Patient said he will contact his old Endo in [place] to schedule an appt, as he has already established with them.

## 2023-03-04 NOTE — Telephone Encounter (Signed)
Mr Spencer English called back in because he spoke to his old Endo office with Spencer English and was advised that he retired and the first available appt is not until December 2024. They checked around for him and 4 other offices are not available to see him until close to the end of the year. He does not want to drive to [place] to see Endo. He insists that Spencer Einar Pheasant filled his medication several times, and can't understand why this can't be filled other than by a endocrinologist. He was told by his previous Endo office that he is supposed to be managed by his PCP until he gets into another endocrinologist. His TOC is not scheduled until 04/29/23.

## 2023-03-05 NOTE — Telephone Encounter (Signed)
Hey Spencer English,   This patient is being treated for hyperthyroidism, looks like was being managed by Dr. Einar Pheasant. He has not had his levels checked in over a year. I am not comfortable with managing hyperthyroid, and am not overly familiar with methimazole, and would like to request maybe he establish with another provider here if able?   I have reached out to patient and advised typically with hyperthyroid this is managed by endo, but he does not want to drive to [place] where I was able to refer him to and get him an appt quickly with, and other endo's are not available until the fall.   Thank you

## 2023-03-13 ENCOUNTER — Other Ambulatory Visit: Payer: Self-pay | Admitting: Family Medicine

## 2023-03-13 DIAGNOSIS — I4891 Unspecified atrial fibrillation: Secondary | ICD-10-CM

## 2023-03-13 DIAGNOSIS — E0590 Thyrotoxicosis, unspecified without thyrotoxic crisis or storm: Secondary | ICD-10-CM

## 2023-03-13 DIAGNOSIS — I2782 Chronic pulmonary embolism: Secondary | ICD-10-CM

## 2023-03-13 NOTE — Telephone Encounter (Signed)
Patient called in and stated that his prescription for rivaroxaban (XARELTO) 20 MG TABS tablet had expired and he needs a new. He stated that the pharmacy faxed something over that just needs to be sent back over in order to fill it. He would like for someone to give him a call about this. Please advise. Thank you!

## 2023-03-13 NOTE — Telephone Encounter (Signed)
Patient is requesting a refill of a different medication. Are you filling any rx's for him?

## 2023-03-17 MED ORDER — RIVAROXABAN 20 MG PO TABS: 20.0000 mg | ORAL_TABLET | Freq: Every morning | ORAL | 3 refills | Status: DC

## 2023-03-17 NOTE — Telephone Encounter (Signed)
Any update on this pt? This is the one with hyperthyroid that I really do not want to have establish care with me as I do not treat hyperthyroid and pt is resistant to seeing an endocrinologist. I do see he is still on my schedule.

## 2023-03-20 NOTE — Telephone Encounter (Signed)
I have called and spoke to patient. Was on the phone over 15 minutes. He has not called endo and wanted to know why we had not called in refill. Advised patient of all the dates that he was informed Lawerance Bach would not be filling and to call to be seen at endo. Patient states it doesn't make any sense he can go to urgent care and get refill of this medication. I advised patient I am going to send Tabitha message letting her know he has been advised yet again to call endo ASAP and get first available and patient will go to urgent care to get refill. Patient agreed to this plan and had him repeat back to me that he is going to call for appointment and is aware that it will not be refilled by Tabitha.  Patient did not want to cancel his appointment at this time.

## 2023-03-23 NOTE — Telephone Encounter (Signed)
I would feel more comfortable with pt seeing another provider as previously stated as likely not going to see an endo for at least 6 months given h/o of getting people seen. Are we able to work on this as I am not comfortable treating hyperthyroid. Third message sent out in relation to this. Hoping to get this r/s before he establishes with me in May.   Pt should have more refill left prior to needing refill.  I have also placed a lab order if pt could make lab only to see his most recent thyroid levels as have not been checked since 3/23

## 2023-03-23 NOTE — Addendum Note (Signed)
Addended by: Eugenia Pancoast on: 03/23/2023 09:34 AM   Modules accepted: Orders

## 2023-03-26 ENCOUNTER — Encounter: Payer: Self-pay | Admitting: Nurse Practitioner

## 2023-04-03 ENCOUNTER — Other Ambulatory Visit: Payer: Self-pay

## 2023-04-03 DIAGNOSIS — I10 Essential (primary) hypertension: Secondary | ICD-10-CM

## 2023-04-03 MED ORDER — AMLODIPINE BESYLATE 5 MG PO TABS: 5.0000 mg | ORAL_TABLET | Freq: Every day | ORAL | 0 refills | Status: DC

## 2023-04-03 MED ORDER — FUROSEMIDE 40 MG PO TABS: 40.0000 mg | ORAL_TABLET | Freq: Every day | ORAL | 0 refills | Status: DC

## 2023-04-03 NOTE — Telephone Encounter (Signed)
Prescription Request  04/03/2023  LOV: 02/16/2023 acute visit;  08/06/22 acute visit;  and 03/05/22 HFU  What is the name of the medication or equipment? Amlodipine 5 mg last filled # 90 on 01/01/23 and        furosemide 40 mg # 90 on 01/01/23  Have you contacted your pharmacy to request a refill? Yes   Which pharmacy would you like this sent to?  [id number] - [place], [place] - [address] AT [address] [place] [address] Phone: [phone] Fax: [phone]     Patient notified that their request is being sent to the clinical staff for review and that they should receive a response within 2 business days.   Please advise at  [address]     Pt has TOC scheduled on 04/29/23. Sending request to Worthy Rancher FNP.

## 2023-04-20 ENCOUNTER — Encounter: Payer: BC Managed Care – PPO | Admitting: Family

## 2023-04-29 ENCOUNTER — Encounter: Payer: BC Managed Care – PPO | Admitting: Family

## 2023-05-20 ENCOUNTER — Encounter: Payer: Self-pay | Admitting: Nurse Practitioner

## 2023-05-20 ENCOUNTER — Ambulatory Visit (INDEPENDENT_AMBULATORY_CARE_PROVIDER_SITE_OTHER): Payer: BC Managed Care – PPO | Admitting: Nurse Practitioner

## 2023-05-20 VITALS — BP 133/87 | Ht 72.0 in | Wt 256.8 lb

## 2023-05-20 DIAGNOSIS — E0590 Thyrotoxicosis, unspecified without thyrotoxic crisis or storm: Secondary | ICD-10-CM | POA: Diagnosis not present

## 2023-05-20 MED ORDER — METHIMAZOLE 5 MG PO TABS: 10.0000 mg | ORAL_TABLET | Freq: Every day | ORAL | 1 refills | Status: DC

## 2023-05-20 NOTE — Patient Instructions (Signed)
Hyperthyroidism Hyperthyroidism refers to a thyroid gland that is too active or overactive. The thyroid gland is a small gland located in the lower front part of the neck, just in front of the windpipe (trachea). This gland makes hormones that: Help control how the body uses food for energy (metabolism). Help the heart and brain work well. Keep your bones strong. When the thyroid is overactive, it produces too much of a hormone called thyroxine. What are the causes? This condition may be caused by: Graves' disease. This is a disorder in which the body's disease-fighting system (immune system) attacks the thyroid gland. This is the most common cause. Inflammation of the thyroid gland. A tumor in the thyroid gland. Use of certain medicines, including: Prescription thyroid hormone replacement. Herbal supplements that mimic thyroid hormones. Amiodarone therapy. Solid or fluid-filled lumps within your thyroid gland (thyroid nodules). Taking in a large amount of iodine from foods or medicines. What increases the risk? You are more likely to develop this condition if: You are male. You have a family history of thyroid conditions. You smoke tobacco. You use a medicine called lithium. You take medicines that affect the immune system (immunosuppressants). What are the signs or symptoms? Symptoms of this condition include: Nervousness. Inability to tolerate heat. Diarrhea. Rapid heart rate. Shaky hands. Restlessness. Sleep problems. Other symptoms may include: Heart skipping beats or making extra beats. Unexplained weight loss. Change in the texture of hair or skin. Loss of menstruation. Fatigue. Enlarged thyroid gland or a lump in the thyroid (nodule). You may also have symptoms of Graves' disease, which may include: Protruding eyes. Dry eyes. Red or swollen eyes. Problems with vision. How is this diagnosed? This condition may be diagnosed based on: Your symptoms and medical  history. A physical exam. Blood tests. Thyroid ultrasound. This test involves using sound waves to produce images of the thyroid gland. A thyroid scan. A radioactive substance is injected into a vein, and images show how much iodine is present in the thyroid. Radioactive iodine uptake test (RAIU). A small amount of radioactive iodine is given by mouth to see how much iodine the thyroid absorbs after a certain amount of time. How is this treated? Treatment depends on the cause and severity of the condition. Treatment may include: Medicines to reduce the amount of thyroid hormone your body makes. Medicines to help manage your symptoms. Radioactive iodine treatment (radioiodine therapy). This involves swallowing a small dose of radioactive iodine, in capsule or liquid form, to kill thyroid cells. Surgery to remove part or all of your thyroid gland. You may need to take thyroid hormone replacement medicine for the rest of your life after thyroid surgery. Follow these instructions at home:  Take over-the-counter and prescription medicines only as told by your health care provider. Do not use any products that contain nicotine or tobacco. These products include cigarettes, chewing tobacco, and vaping devices, such as e-cigarettes. If you need help quitting, ask your health care provider. Follow any instructions from your health care provider about diet. You may be instructed to limit foods that contain iodine. Keep all follow-up visits. You will need to have blood tests regularly so that your health care provider can monitor your condition. Where to find more information National Institute of Diabetes and Digestive and Kidney Diseases: niddk.nih.gov Contact a health care provider if: Your symptoms do not get better with treatment. You have a fever. You have abdominal pain. You feel dizzy. You are taking thyroid hormone replacement medicine and: You have   symptoms of depression. You feel like you  are tired all the time. You gain weight. Get help right away if: You have sudden, unexplained confusion or other mental changes. You have chest pain. You have fast or irregular heartbeats (palpitations). You have difficulty breathing. These symptoms may be an emergency. Get help right away. Call 911. Do not wait to see if the symptoms will go away. Do not drive yourself to the hospital. Summary The thyroid gland is a small gland located in the lower front part of the neck, just in front of the windpipe. Hyperthyroidism is when the thyroid gland is too active and produces too much of a hormone called thyroxine. The most common cause is Graves' disease, a disorder in which your immune system attacks the thyroid gland. Hyperthyroidism can cause various symptoms, such as unexplained weight loss, nervousness, inability to tolerate heat, or changes in your heartbeat. Treatment may include medicine to reduce the amount of thyroid hormone your body makes, radioiodine therapy, surgery, or medicines to manage symptoms. This information is not intended to replace advice given to you by your health care provider. Make sure you discuss any questions you have with your health care provider. Document Revised: 02/07/2022 Document Reviewed: 02/07/2022 Elsevier Patient Education  2023 Elsevier Inc.  

## 2023-05-20 NOTE — Progress Notes (Signed)
05/20/2023     Endocrinology Consult Note    Subjective:    Patient ID: Spencer English, male    DOB: [birth year], PCP Gweneth Dimitri, MD.   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   Leukopenia    Long term (current) use of anticoagulants 04/03/2011   PULMONARY EMBOLISM 05/03/2009   Sleep apnea    CPAP at bedtime   UNSPECIFIED URINARY CALCULUS     Past Surgical History:  Procedure Laterality Date   A-FLUTTER ABLATION N/A 02/11/2022   Procedure: A-FLUTTER ABLATION;  Surgeon: Marinus Maw, MD;  Location: [facility] INVASIVE CV LAB;  Service: Cardiovascular;  Laterality: N/A;   ANTERIOR CRUCIATE LIGAMENT REPAIR  2000   Right   CYSTECTOMY     back of head   KNEE ARTHROSCOPY     left knee   KNEE ARTHROSCOPY  01/03/2013   Procedure: ARTHROSCOPY KNEE;  Surgeon: Thera Flake., MD;  Location: [facility] OR;  Service: Orthopedics;  Laterality: Left;  Lavage Synovectomy, Removal of loose body   KNEE ARTHROSCOPY W/ ACL RECONSTRUCTION     right knee   TEE WITHOUT CARDIOVERSION N/A 02/11/2022   Procedure: TRANSESOPHAGEAL ECHOCARDIOGRAM (TEE);  Surgeon: Marinus Maw, MD;  [facility] CV [facility];  Service: Cardiovascular;  Laterality: N/A;    Social History   Socioeconomic History   Marital status: Married    Spouse name: Marcelino Duster   Number of children: 3   Years of education: high school   Highest education level: Not on file  Occupational History   Occupation: Truck Air traffic controller: [employer]    Employer: [employer]  Tobacco Use   Smoking status:  Some Days    Types: Cigars   Smokeless tobacco: Never  Vaping Use   Vaping Use: Never used  Substance and Sexual Activity   Alcohol use: Yes    Alcohol/week: 2.0 - 4.0 standard drinks of alcohol    Types: 2 - 4 Cans of beer per week    Comment: 2-24oz beers weekends only 03/05/22   Drug use: No   Sexual activity: Not on file  Other Topics Concern   Not on file  Social History Narrative   11/01/20   From: the area   Living: with wife, Marcelino Duster (1990)   Work: truck Hospital doctor - [employer]      Family: 3 adult children - Spencer English, Spencer English, Spencer English      Enjoys: play basketball, fish, spend time with family      Exercise: stationary bike - rare use   Diet: grilled and baked food      Safety   Seat belts: Yes    Guns: No   Safe in relationships: Yes    Social Determinants of Corporate investment banker Strain: Not on file  Food Insecurity: Not on file  Transportation Needs: Not on file  Physical Activity: Not on file  Stress: Not on file  Social Connections: Not on file    Family History  Problem  Relation Age of Onset   Diabetes Mother    Hypertension Mother    Hyperlipidemia Mother    Dementia Mother    Diabetes Father    Hypertension Father    Hyperlipidemia Father    Heart attack Father    Heart disease Father    Prostate cancer Father 75   Stomach cancer Neg Hx    Colon cancer Neg Hx    Pancreatic cancer Neg Hx    Esophageal cancer Neg Hx    Rectal cancer Neg Hx     Outpatient Encounter Medications as of 05/20/2023  Medication Sig   amLODipine (NORVASC) 5 MG tablet Take 1 tablet (5 mg total) by mouth daily.   atorvastatin (LIPITOR) 40 MG tablet Take 1 tablet (40 mg total) by mouth daily.   dapagliflozin propanediol (FARXIGA) 5 MG TABS tablet Take 1 tablet (5 mg total) by mouth daily.   furosemide (LASIX) 40 MG tablet Take 1 tablet (40 mg total) by mouth daily.   glucose blood (CONTOUR NEXT TEST) test strip 1 each by Other route daily. And lancets 1/day    losartan (COZAAR) 100 MG tablet Take 1 tablet (100 mg total) by mouth daily.   methimazole (TAPAZOLE) 5 MG tablet Take 2 tablets (10 mg total) by mouth daily.   metoprolol succinate (TOPROL-XL) 50 MG 24 hr tablet Take 1 tablet (50 mg total) by mouth daily.   polyvinyl alcohol (LIQUIFILM TEARS) 1.4 % ophthalmic solution Place 1 drop into both eyes as needed for dry eyes.   rivaroxaban (XARELTO) 20 MG TABS tablet Take 1 tablet (20 mg total) by mouth every morning.   sitaGLIPtin (JANUVIA) 100 MG tablet Take 1 tablet (100 mg total) by mouth daily.   [DISCONTINUED] methimazole (TAPAZOLE) 10 MG tablet TAKE 1 TABLET(10 MG) BY MOUTH DAILY   amoxicillin-clavulanate (AUGMENTIN) 875-125 MG tablet Take 1 tablet by mouth 2 (two) times daily. (Patient not taking: Reported on 05/20/2023)   No facility-administered encounter medications on file as of 05/20/2023.    ALLERGIES: Allergies  Allergen Reactions   Metformin Diarrhea and Nausea Only   Viagra [Sildenafil Citrate] Other (See Comments)    headache    VACCINATION STATUS: Immunization History  Administered Date(s) Administered   Hepatitis B, ADULT 06/02/2017, 07/07/2017, 12/30/2017   Influenza Split 09/06/2012   Influenza Whole 11/10/2008, 09/24/2009, 10/24/2010   Influenza,inj,Quad PF,6+ Mos 10/10/2013, 10/20/2014, 09/05/2015, 03/09/2017, 09/15/2018, 09/02/2019, 10/14/2020   Moderna Covid-19 Vaccine Bivalent Booster 42yrs & up 11/23/2021   Moderna Sars-Covid-2 Vaccination 02/26/2020, 03/31/2020, 10/20/2020   Td 12/30/2007     HPI  Spencer English is 57 y.o. male who presents today with a medical history as above. he is being seen in consultation for hyperthyroidism requested by Gweneth Dimitri, MD.  He was previously being managed by Dr. Everardo All for his hyperthyroidism.  Prior to his initial diagnosis around 10 years ago, he had symptoms of anxiety, palpitations, weight loss.  He was put on Methimazole at that time and has done well ever since.   He notes he did discuss potential RAI ablation with Dr. Everardo All but then it was never discussed again. He has had cardiac ablation for Afib in the past.  he denies dysphagia, choking, shortness of breath, no recent voice change.    he denies family history of thyroid dysfunction and denies family hx of thyroid cancer. he denies personal history of goiter. he is not on any thyroid hormone supplements.  He is currently taking Methimazole 10 mg po daily. Denies  use of Biotin containing supplements.  he is willing to proceed with appropriate work up and therapy for thyrotoxicosis.   Review of systems  Constitutional: + Minimally fluctuating body weight, current Body mass index is 34.83 kg/m., no fatigue, no subjective hyperthermia, no subjective hypothermia Eyes: no blurry vision, no xerophthalmia ENT: no sore throat, no nodules palpated in throat, no dysphagia/odynophagia, no hoarseness Cardiovascular: no chest pain, no shortness of breath, no palpitations, no leg swelling Respiratory: no cough, no shortness of breath Gastrointestinal: no nausea/vomiting/diarrhea Musculoskeletal: no muscle/joint aches Skin: no rashes, no hyperemia Neurological: no tremors, no numbness, no tingling, no dizziness Psychiatric: no depression, no anxiety   Objective:    BP 133/87 (BP Location: Left Arm, Patient Position: Sitting, Cuff Size: Large)   Ht 6' (1.829 m)   Wt 256 lb 12.8 oz (116.5 kg)   BMI 34.83 kg/m   Wt Readings from Last 3 Encounters:  05/20/23 256 lb 12.8 oz (116.5 kg)  02/16/23 261 lb (118.4 kg)  08/06/22 249 lb 6 oz (113.1 kg)     BP Readings from Last 3 Encounters:  05/20/23 133/87  02/16/23 (!) 160/90  08/06/22 130/72                          Physical Exam- Limited  Constitutional:  Body mass index is 34.83 kg/m. , not in acute distress, normal state of mind Eyes:  EOMI, no exophthalmos Neck: Supple Thyroid: No gross goiter, L>R Cardiovascular: RRR, no murmurs, rubs, or  gallops, no edema Respiratory: Adequate breathing efforts, no crackles, rales, rhonchi, or wheezing Musculoskeletal: no gross deformities, strength intact in all four extremities, no gross restriction of joint movements Skin:  no rashes, no hyperemia Neurological: no tremor with outstretched hands, DTR normal in BLE   CMP     Component Value Date/Time   NA 139 02/15/2022 1358   NA 141 10/06/2018 1449   K 3.9 02/15/2022 1358   CL 104 02/15/2022 1358   CO2 27 02/15/2022 1358   GLUCOSE 164 (H) 02/15/2022 1358   GLUCOSE 112 (H) 10/30/2006 1637   BUN 14 02/15/2022 1358   BUN 7 10/06/2018 1449   CREATININE 1.18 02/15/2022 1358   CREATININE 1.12 02/18/2013 1859   CALCIUM 9.1 02/15/2022 1358   PROT 7.4 12/24/2021 1953   ALBUMIN 4.0 12/24/2021 1953   AST 14 (L) 12/24/2021 1953   ALT 15 12/24/2021 1953   ALKPHOS 56 12/24/2021 1953   BILITOT 1.1 12/24/2021 1953   GFRNONAA >60 02/15/2022 1358   GFRAA >60 12/07/2018 2016     CBC    Component Value Date/Time   WBC 4.3 02/15/2022 1358   RBC 5.45 02/15/2022 1358   HGB 14.5 02/15/2022 1358   HCT 45.5 02/15/2022 1358   PLT 221 02/15/2022 1358   MCV 83.5 02/15/2022 1358   MCH 26.6 02/15/2022 1358   MCHC 31.9 02/15/2022 1358   RDW 16.1 (H) 02/15/2022 1358   LYMPHSABS 1.0 02/15/2022 1358   MONOABS 0.4 02/15/2022 1358   EOSABS 0.2 02/15/2022 1358   BASOSABS 0.1 02/15/2022 1358     Diabetic Labs (most recent): Lab Results  Component Value Date   HGBA1C 6.7 (A) 08/06/2022   HGBA1C 6.7 (H) 02/08/2022   HGBA1C 7.4 (H) 12/18/2020   MICROALBUR 1.9 03/25/2018   MICROALBUR 1.4 03/09/2017   MICROALBUR 7.0 (H) 10/19/2015    Lipid Panel     Component Value Date/Time   CHOL 205 (H) 12/18/2020 1624  TRIG 63.0 12/18/2020 1624   TRIG 55 10/30/2006 1637   HDL 49.00 12/18/2020 1624   CHOLHDL 4 12/18/2020 1624   VLDL 12.6 12/18/2020 1624   LDLCALC 143 (H) 12/18/2020 1624   LDLDIRECT 147.2 10/10/2013 1402     Lab Results   Component Value Date   TSH 0.523 02/08/2022   TSH 1.18 07/19/2020   TSH 1.36 07/16/2020   TSH 1.52 08/17/2018   TSH 1.31 03/25/2018   TSH 1.37 10/07/2017   TSH 1.73 03/09/2017   TSH 1.76 10/19/2015   TSH 1.61 06/27/2014   TSH 1.793 10/27/2013   FREET4 0.87 07/19/2020   FREET4 0.89 03/25/2018   FREET4 0.93 10/07/2017   FREET4 0.61 06/27/2014   FREET4 0.97 10/27/2013   FREET4 1.21 02/18/2013   FREET4 2.36 (H) 01/26/2013        Assessment & Plan:   1) Hyperthyroidism  he is being seen at a kind request of Gweneth Dimitri, MD.  his history and most recent labs are reviewed, and he was examined clinically. Subjective and objective findings are consistent with thyrotoxicosis likely from primary hyperthyroidism. The potential risks of untreated thyrotoxicosis and the need for definitive therapy have been discussed in detail with him, and he agrees to proceed with diagnostic workup and treatment plan.  He is currently on Methimazole 10 mg po daily.  Will have him continue this for now and perform more comprehensive thyroid testing to help classify his dysfunction further so we can tailor his treatment plan to meet his needs.-  Will call patient with results and plans moving forward.  He is interested in RAI ablation if necessary to take care of this once and for all.   Options of therapy are discussed with him.  We discussed the option of treating it with medications including methimazole or PTU which may have side effects including rash, transaminitis, and bone marrow suppression.  We also discussed the option of definitive therapy with RAI ablation of the thyroid. If he is found to have primary hyperthyroidism from Graves' disease , toxic multinodular goiter or toxic nodular goiter the preferred modality of treatment would be I-131 thyroid ablation. Surgery is another choice of treatment in some cases, in his case surgery is not a good fit for presentation with only mild  goiter.  -Patient is made aware of the high likelihood of post ablative hypothyroidism with subsequent need for lifelong thyroid hormone replacement. he understands this outcome and he is  willing to proceed.         -Patient is advised to maintain close follow up with Gweneth Dimitri, MD for primary care needs.   - Time spent with the patient: 60 minutes, of which >50% was spent in obtaining information about his symptoms, reviewing his previous labs, evaluations, and treatments, counseling him about his hyperthyroidism , and developing a plan to confirm the diagnosis and long term treatment as necessary. Please refer to "Patient Self Inventory" in the Media tab for reviewed elements of pertinent patient history.  Spencer English participated in the discussions, expressed understanding, and voiced agreement with the above plans.  All questions were answered to his satisfaction. he is encouraged to contact clinic should he have any questions or concerns prior to his return visit.   Follow up plan: Return will call patient after labs result with further instructions, for Thyroid follow up, Previsit labs.   Thank you for involving me in the care of this pleasant patient, and I will continue to update you  with his progress.    Ronny Bacon, [facility] [address] [place], [place] [address] Phone: [phone] Fax: [phone]  05/20/2023, 1:56 PM

## 2023-05-27 ENCOUNTER — Other Ambulatory Visit: Payer: Self-pay | Admitting: Family

## 2023-05-27 DIAGNOSIS — E1169 Type 2 diabetes mellitus with other specified complication: Secondary | ICD-10-CM

## 2023-06-17 ENCOUNTER — Telehealth: Payer: Self-pay | Admitting: Family Medicine

## 2023-06-17 DIAGNOSIS — I10 Essential (primary) hypertension: Secondary | ICD-10-CM

## 2023-06-17 NOTE — Telephone Encounter (Signed)
Prescription Request  06/17/2023  LOV: 08/06/2022  What is the name of the medication or equipment? amLODipine (NORVASC) 5 MG tablet   losartan (COZAAR) 100 MG tablet   Have you contacted your pharmacy to request a refill? Yes   Which pharmacy would you like this sent to?  [id number] - [place], [place] - [address] AT [address] [place] [address] Phone: [phone] Fax: [phone]     Patient notified that their request is being sent to the clinical staff for review and that they should receive a response within 2 business days.   Please advise at Mobile [phone] (mobile)

## 2023-06-17 NOTE — Telephone Encounter (Signed)
Patient has toc app with you soon. Ok to refill?

## 2023-06-18 NOTE — Telephone Encounter (Signed)
At this time, I would like to request that he is put with another provider. I do not feel comfortable with treating hyperthyroid.

## 2023-06-22 NOTE — Telephone Encounter (Signed)
Please call patient to set up with new pt appointment with MD as requested

## 2023-06-22 NOTE — Telephone Encounter (Signed)
Can you help with this?

## 2023-06-23 NOTE — Telephone Encounter (Signed)
Reached out to patient to schedule an appointment sooner with a different provider Unable to leave a voicemail.

## 2023-06-23 NOTE — Telephone Encounter (Signed)
Spoke with patient regarding this issue, patient would like a call back as soon as possible. Patient says he is out of this medication and his blood pressure is getting a little higher. Please advise, thank you

## 2023-06-24 MED ORDER — AMLODIPINE BESYLATE 5 MG PO TABS: 5.0000 mg | ORAL_TABLET | Freq: Every day | ORAL | 0 refills | Status: DC

## 2023-06-24 MED ORDER — LOSARTAN POTASSIUM 100 MG PO TABS: 100.0000 mg | ORAL_TABLET | Freq: Every day | ORAL | 0 refills | Status: DC

## 2023-06-24 NOTE — Telephone Encounter (Signed)
Patient has TOC app with you has been out x 5 days ok to refill until appointment.

## 2023-06-24 NOTE — Telephone Encounter (Signed)
A 30 day supply of amlodipine and losartan sent to pharmacy

## 2023-06-24 NOTE — Telephone Encounter (Signed)
Called patient reviewed all information and repeated back to me. Will call if any questions.  Will call if any issues getting picked up.

## 2023-06-24 NOTE — Telephone Encounter (Signed)
Patient completely out of BP medication Rescheduled TOC with Audria Nine on 7.24.24, earliest time available for due to work schedule and other appointments scheduled  Can he get a refill today?  Please advise.  Medication and pharmacy below is accurate

## 2023-06-24 NOTE — Addendum Note (Signed)
Addended by: Eden Emms on: 06/24/2023 12:58 PM   Modules accepted: Orders

## 2023-06-25 ENCOUNTER — Other Ambulatory Visit: Payer: Self-pay | Admitting: Nurse Practitioner

## 2023-06-25 ENCOUNTER — Telehealth: Payer: Self-pay | Admitting: Family Medicine

## 2023-06-25 DIAGNOSIS — I4891 Unspecified atrial fibrillation: Secondary | ICD-10-CM

## 2023-06-25 DIAGNOSIS — I10 Essential (primary) hypertension: Secondary | ICD-10-CM

## 2023-06-25 MED ORDER — METOPROLOL SUCCINATE ER 50 MG PO TB24
50.0000 mg | ORAL_TABLET | Freq: Every day | ORAL | 0 refills | Status: DC
Start: 2023-06-25 — End: 2023-07-22

## 2023-06-25 NOTE — Telephone Encounter (Signed)
30 day supply written.

## 2023-06-25 NOTE — Addendum Note (Signed)
Addended by: Eden Emms on: 06/25/2023 04:45 PM   Modules accepted: Orders

## 2023-06-25 NOTE — Telephone Encounter (Signed)
Pt has an appointment with you 07/22/23.  Pt needs refill before appointment.  Are you willing to send in refill?

## 2023-06-25 NOTE — Telephone Encounter (Signed)
Prescription Request  06/25/2023  LOV: 08/06/2022  What is the name of the medication or equipment?  metoprolol succinate (TOPROL-XL) 50 MG 24 hr tablet  Have you contacted your pharmacy to request a refill? Yes   Which pharmacy would you like this sent to?   [id number] [place], [place] - [phone] AT [facility] [address] [place] [address] Phone: [phone] Fax: [phone]    Patient notified that their request is being sent to the clinical staff for review and that they should receive a response within 2 business days.   Please advise at Mobile [phone] (mobile)

## 2023-07-08 ENCOUNTER — Telehealth: Payer: Self-pay

## 2023-07-08 ENCOUNTER — Telehealth: Payer: Self-pay | Admitting: Family Medicine

## 2023-07-08 DIAGNOSIS — E1169 Type 2 diabetes mellitus with other specified complication: Secondary | ICD-10-CM

## 2023-07-08 NOTE — Telephone Encounter (Signed)
Has TOC with you later this month ok to fill?

## 2023-07-08 NOTE — Telephone Encounter (Signed)
Order for Atorvastatin is pending.

## 2023-07-08 NOTE — Telephone Encounter (Signed)
Pt called following up on med refill from pharmacy for the meds, atorvastatin (LIPITOR) 40 MG tablet. Refill request is in [facility] [address]. Call back # [phone]

## 2023-07-10 ENCOUNTER — Other Ambulatory Visit: Payer: Self-pay | Admitting: Nurse Practitioner

## 2023-07-10 DIAGNOSIS — E1169 Type 2 diabetes mellitus with other specified complication: Secondary | ICD-10-CM

## 2023-07-10 MED ORDER — ATORVASTATIN CALCIUM 40 MG PO TABS: 40.0000 mg | ORAL_TABLET | Freq: Every day | ORAL | 0 refills | Status: DC

## 2023-07-10 NOTE — Telephone Encounter (Signed)
30 days supply provided.

## 2023-07-10 NOTE — Addendum Note (Signed)
Addended by: Eden Emms on: 07/10/2023 08:00 AM   Modules accepted: Orders

## 2023-07-22 ENCOUNTER — Ambulatory Visit: Payer: BC Managed Care – PPO | Admitting: Nurse Practitioner

## 2023-07-22 ENCOUNTER — Encounter: Payer: BC Managed Care – PPO | Admitting: Family

## 2023-07-22 ENCOUNTER — Encounter: Payer: Self-pay | Admitting: Nurse Practitioner

## 2023-07-22 VITALS — BP 110/80 | HR 64 | Temp 97.8°F | Ht 72.0 in | Wt 263.6 lb

## 2023-07-22 DIAGNOSIS — G4733 Obstructive sleep apnea (adult) (pediatric): Secondary | ICD-10-CM

## 2023-07-22 DIAGNOSIS — E669 Obesity, unspecified: Secondary | ICD-10-CM

## 2023-07-22 DIAGNOSIS — Z125 Encounter for screening for malignant neoplasm of prostate: Secondary | ICD-10-CM

## 2023-07-22 DIAGNOSIS — I4891 Unspecified atrial fibrillation: Secondary | ICD-10-CM

## 2023-07-22 DIAGNOSIS — Z8739 Personal history of other diseases of the musculoskeletal system and connective tissue: Secondary | ICD-10-CM | POA: Diagnosis not present

## 2023-07-22 DIAGNOSIS — E118 Type 2 diabetes mellitus with unspecified complications: Secondary | ICD-10-CM

## 2023-07-22 DIAGNOSIS — I10 Essential (primary) hypertension: Secondary | ICD-10-CM | POA: Diagnosis not present

## 2023-07-22 DIAGNOSIS — E785 Hyperlipidemia, unspecified: Secondary | ICD-10-CM | POA: Diagnosis not present

## 2023-07-22 DIAGNOSIS — Z720 Tobacco use: Secondary | ICD-10-CM | POA: Diagnosis not present

## 2023-07-22 DIAGNOSIS — D6869 Other thrombophilia: Secondary | ICD-10-CM

## 2023-07-22 DIAGNOSIS — E1169 Type 2 diabetes mellitus with other specified complication: Secondary | ICD-10-CM

## 2023-07-22 LAB — POCT GLYCOSYLATED HEMOGLOBIN (HGB A1C): Hemoglobin A1C: 6.7 % — AB (ref 4.0–5.6)

## 2023-07-22 MED ORDER — DAPAGLIFLOZIN PROPANEDIOL 5 MG PO TABS
5.0000 mg | ORAL_TABLET | Freq: Every day | ORAL | 1 refills | Status: DC
Start: 2023-07-22 — End: 2024-09-26

## 2023-07-22 MED ORDER — FUROSEMIDE 40 MG PO TABS
40.0000 mg | ORAL_TABLET | Freq: Every day | ORAL | 1 refills | Status: DC
Start: 2023-07-22 — End: 2024-04-19

## 2023-07-22 MED ORDER — SITAGLIPTIN PHOSPHATE 100 MG PO TABS
100.0000 mg | ORAL_TABLET | Freq: Every day | ORAL | 1 refills | Status: DC
Start: 2023-07-22 — End: 2024-04-20

## 2023-07-22 MED ORDER — AMLODIPINE BESYLATE 5 MG PO TABS
5.0000 mg | ORAL_TABLET | Freq: Every day | ORAL | 1 refills | Status: DC
Start: 2023-07-22 — End: 2024-04-20

## 2023-07-22 MED ORDER — METOPROLOL SUCCINATE ER 50 MG PO TB24
50.0000 mg | ORAL_TABLET | Freq: Every day | ORAL | 1 refills | Status: DC
Start: 2023-07-22 — End: 2024-04-20

## 2023-07-22 MED ORDER — LOSARTAN POTASSIUM 100 MG PO TABS
100.0000 mg | ORAL_TABLET | Freq: Every day | ORAL | 1 refills | Status: DC
Start: 2023-07-22 — End: 2024-04-20

## 2023-07-22 MED ORDER — ATORVASTATIN CALCIUM 40 MG PO TABS
40.0000 mg | ORAL_TABLET | Freq: Every day | ORAL | 1 refills | Status: DC
Start: 2023-07-22 — End: 2024-04-20

## 2023-07-22 NOTE — Assessment & Plan Note (Signed)
Patient currently maintained on atorvastatin 40 mg daily.  Refill provided today pending lipid panel

## 2023-07-22 NOTE — Assessment & Plan Note (Signed)
History of the same patient takes allopurinol 300 mg as needed per his report pending uric acid level today

## 2023-07-22 NOTE — Assessment & Plan Note (Signed)
Patient was followed by Dr. Rennis Harding in the past.  Currently maintained on Januvia 100 mg Farxiga 5 mg.  A1c of 6.7% today.  Updated detailed foot exam today along with urine microalbuminuria.  Follow-up 6 months for DM recheck

## 2023-07-22 NOTE — Assessment & Plan Note (Signed)
History of the same was followed by Dr. Johney Frame and Dr. Ladona Ridgel.  Patient is status post ablation.  Currently on Xarelto 20 mg daily in normal rhythm today continue medication as prescribed

## 2023-07-22 NOTE — Patient Instructions (Signed)
Nice to see you today. I will be in touch with the labs once I have reviewed them Follow up with me in 6 months, sooner if you need me   Consider getting the shingles vaccine and pneumonia vaccine.

## 2023-07-22 NOTE — Assessment & Plan Note (Signed)
Patient currently maintained on Xarelto 20 mg.  Continue

## 2023-07-22 NOTE — Assessment & Plan Note (Signed)
Patient adherent to CPAP therapy.  Continue following up with pulmonology using CPAP as directed.

## 2023-07-22 NOTE — Progress Notes (Signed)
Established Patient Office Visit  Subjective   Patient ID: Spencer English, male    DOB: 07-Mar-1966  Age: 57 y.o. MRN: [id number]  Chief Complaint  Patient presents with   Transitions Of Care    HPI  Transfer of care: last seen by Gweneth Dimitri, MD on 08/06/2022  DM2: Patient currently maintained on Farxiga 5, Januvia 100. States that he does not check sugars at home   HTN: Patient currently maintained on amlodipine 5, losartan 100, metoprolol 50.  Afib/CHF: Patient currently maintained on Xarelto, metoprolol, verapamil. States Dr Tammy Sours taylor, dr allred   OSA: states 6-8 hours when he sleeps   HLD: Patient currently on atorvastatin 40  Hyperthyroid: Patient currently maintained on methimazole 5. [facility] for endocrine   ED  Colonoscopy:  08/21/2020 repeat in 10 years. PSA: due  Tdap: 2009, FLu Covid: UTD Pna Shingles         Review of Systems  Constitutional:  Negative for chills and fever.  Respiratory:  Negative for shortness of breath.   Cardiovascular:  Negative for chest pain and leg swelling.  Gastrointestinal:  Negative for abdominal pain, blood in stool, constipation, diarrhea, nausea and vomiting.  Genitourinary:  Negative for dysuria and hematuria.  Neurological:  Negative for tingling and headaches.  Psychiatric/Behavioral:  Negative for hallucinations and suicidal ideas.       Objective:     BP 110/80   Pulse 64   Temp 97.8 F (36.6 C) (Temporal)   Ht 6' (1.829 m)   Wt 263 lb 9.6 oz (119.6 kg)   SpO2 97%   BMI 35.75 kg/m    Physical Exam Vitals and nursing note reviewed.  Constitutional:      Appearance: Normal appearance.  HENT:     Right Ear: Tympanic membrane, ear canal and external ear normal.     Left Ear: Tympanic membrane, ear canal and external ear normal.     Mouth/Throat:     Mouth: Mucous membranes are moist.     Pharynx: Oropharynx is clear.  Eyes:     Extraocular Movements: Extraocular movements intact.      Pupils: Pupils are equal, round, and reactive to light.  Cardiovascular:     Rate and Rhythm: Normal rate and regular rhythm.     Pulses: Normal pulses.     Heart sounds: Normal heart sounds.  Pulmonary:     Effort: Pulmonary effort is normal.     Breath sounds: Normal breath sounds.  Musculoskeletal:     Right lower leg: 1+ Pitting Edema present.     Left lower leg: 1+ Pitting Edema present.  Lymphadenopathy:     Cervical: No cervical adenopathy.  Skin:    General: Skin is warm.  Neurological:     General: No focal deficit present.     Mental Status: He is alert.     Deep Tendon Reflexes:     Reflex Scores:      Bicep reflexes are 2+ on the right side and 2+ on the left side.      Patellar reflexes are 2+ on the right side and 2+ on the left side.    Comments: Bilateral upper and lower extremity strength 5/5  Psychiatric:        Mood and Affect: Mood normal.        Behavior: Behavior normal.        Thought Content: Thought content normal.        Judgment: Judgment normal.    Diabetic Foot  Form - Detailed   Diabetic Foot Exam - detailed Diabetic Foot exam was performed with the following findings: Yes 07/22/2023  2:46 PM  Is there swelling or and abnormal foot shape?: No Is there a claw toe deformity?: No Is there elevated skin temparature?: No Pulse Foot Exam completed.: Yes   Right posterior Tibialias: Present Left posterior Tibialias: Present   Right Dorsalis Pedis: Present Left Dorsalis Pedis: Present  Sensory Foot Exam Completed.: Yes Semmes-Weinstein Monofilament Test   Comments: All 10 sites tested bilaterally intact  Dry skin on bilateral feet       Results for orders placed or performed in visit on 07/22/23  POCT glycosylated hemoglobin (Hb A1C)  Result Value Ref Range   Hemoglobin A1C 6.7 (A) 4.0 - 5.6 %   HbA1c POC (<> result, manual entry)     HbA1c, POC (prediabetic range)     HbA1c, POC (controlled diabetic range)        The 10-year ASCVD risk  score (Arnett DK, et al., 2019) is: 28.2%    Assessment & Plan:   Problem List Items Addressed This Visit       Cardiovascular and Mediastinum   Essential hypertension    Patient currently maintained on amlodipine 5 mg, losartan 100 mg, metoprolol 50 mg XL.  Blood pressure under good control.  Refills provided today.  Continue medication as prescribed      Relevant Medications   verapamil (VERELAN) 240 MG 24 hr capsule   amLODipine (NORVASC) 5 MG tablet   atorvastatin (LIPITOR) 40 MG tablet   furosemide (LASIX) 40 MG tablet   losartan (COZAAR) 100 MG tablet   metoprolol succinate (TOPROL-XL) 50 MG 24 hr tablet   Other Relevant Orders   CBC   Comprehensive metabolic panel   A-fib (HCC)    History of the same was followed by Dr. Johney Frame and Dr. Ladona Ridgel.  Patient is status post ablation.  Currently on Xarelto 20 mg daily in normal rhythm today continue medication as prescribed      Relevant Medications   verapamil (VERELAN) 240 MG 24 hr capsule   amLODipine (NORVASC) 5 MG tablet   atorvastatin (LIPITOR) 40 MG tablet   furosemide (LASIX) 40 MG tablet   losartan (COZAAR) 100 MG tablet   metoprolol succinate (TOPROL-XL) 50 MG 24 hr tablet     Respiratory   OSA on CPAP    Patient adherent to CPAP therapy.  Continue following up with pulmonology using CPAP as directed.        Endocrine   Diabetes mellitus [facility])    Patient was followed by Dr. Rennis Harding in the past.  Currently maintained on Januvia 100 mg Farxiga 5 mg.  A1c of 6.7% today.  Updated detailed foot exam today along with urine microalbuminuria.  Follow-up 6 months for DM recheck      Relevant Medications   atorvastatin (LIPITOR) 40 MG tablet   dapagliflozin propanediol (FARXIGA) 5 MG TABS tablet   losartan (COZAAR) 100 MG tablet   sitaGLIPtin (JANUVIA) 100 MG tablet   Hyperlipidemia associated with type 2 diabetes mellitus (HCC) - Primary    Patient currently maintained on atorvastatin 40 mg daily.  Refill provided  today pending lipid panel      Relevant Medications   verapamil (VERELAN) 240 MG 24 hr capsule   amLODipine (NORVASC) 5 MG tablet   atorvastatin (LIPITOR) 40 MG tablet   dapagliflozin propanediol (FARXIGA) 5 MG TABS tablet   furosemide (LASIX) 40 MG tablet   losartan (  COZAAR) 100 MG tablet   metoprolol succinate (TOPROL-XL) 50 MG 24 hr tablet   sitaGLIPtin (JANUVIA) 100 MG tablet   Other Relevant Orders   POCT glycosylated hemoglobin (Hb A1C) (Completed)   Lipid panel     Other   History of gout    History of the same patient takes allopurinol 300 mg as needed per his report pending uric acid level today      Relevant Orders   Uric acid   Obesity (BMI 30-39.9)    A1c resulted today.  Pending TSH and lipid panel.      Relevant Medications   dapagliflozin propanediol (FARXIGA) 5 MG TABS tablet   sitaGLIPtin (JANUVIA) 100 MG tablet   Secondary hypercoagulable state (HCC)    Patient currently maintained on Xarelto 20 mg.  Continue      Tobacco abuse    History of same with current everyday cigar use.  Check urine microscopy for occult bleeding      Relevant Orders   Urine Microscopic   Other Visit Diagnoses     Screening for prostate cancer       Relevant Orders   PSA       Return in about 6 months (around 01/22/2024) for DM recheck.    Audria Nine, NP

## 2023-07-22 NOTE — Assessment & Plan Note (Signed)
History of same with current everyday cigar use.  Check urine microscopy for occult bleeding

## 2023-07-22 NOTE — Assessment & Plan Note (Signed)
A1c resulted today.  Pending TSH and lipid panel.

## 2023-07-22 NOTE — Assessment & Plan Note (Signed)
Patient currently maintained on amlodipine 5 mg, losartan 100 mg, metoprolol 50 mg XL.  Blood pressure under good control.  Refills provided today.  Continue medication as prescribed

## 2023-07-23 LAB — CBC
HCT: 45.6 % (ref 39.0–52.0)
Hemoglobin: 14.5 g/dL (ref 13.0–17.0)
MCHC: 31.9 g/dL (ref 30.0–36.0)
MCV: 87.7 fl (ref 78.0–100.0)
Platelets: 197 10*3/uL (ref 150.0–400.0)
RBC: 5.19 Mil/uL (ref 4.22–5.81)
RDW: 16.1 % — ABNORMAL HIGH (ref 11.5–15.5)
WBC: 4.7 10*3/uL (ref 4.0–10.5)

## 2023-07-23 LAB — LIPID PANEL
Cholesterol: 172 mg/dL (ref 0–200)
HDL: 48.9 mg/dL (ref >39.00)
LDL Cholesterol: 114 mg/dL — ABNORMAL HIGH (ref 0–99)
NonHDL: 122.86
Total CHOL/HDL Ratio: 4
Triglycerides: 44 mg/dL (ref 0.0–149.0)
VLDL: 8.8 mg/dL (ref 0.0–40.0)

## 2023-07-23 LAB — URIC ACID: Uric Acid, Serum: 6.1 mg/dL (ref 4.0–7.8)

## 2023-07-23 LAB — COMPREHENSIVE METABOLIC PANEL
ALT: 13 U/L (ref 0–53)
Albumin: 3.9 g/dL (ref 3.5–5.2)
Alkaline Phosphatase: 71 U/L (ref 39–117)
BUN: 9 mg/dL (ref 6–23)
Calcium: 8.7 mg/dL (ref 8.4–10.5)
Chloride: 99 mEq/L (ref 96–112)
Creatinine, Ser: 1.23 mg/dL (ref 0.40–1.50)
Glucose, Bld: 100 mg/dL — ABNORMAL HIGH (ref 70–99)
Potassium: 4.5 mEq/L (ref 3.5–5.1)
Sodium: 135 mEq/L (ref 135–145)
Total Bilirubin: 1.1 mg/dL (ref 0.2–1.2)
Total Protein: 7.1 g/dL (ref 6.0–8.3)

## 2023-07-23 LAB — MICROALBUMIN / CREATININE URINE RATIO
Creatinine,U: 18.8 mg/dL
Microalb Creat Ratio: 3.7 mg/g (ref 0.0–30.0)

## 2023-07-23 LAB — URINALYSIS, MICROSCOPIC ONLY: WBC, UA: NONE SEEN (ref 0–?)

## 2023-07-23 LAB — PSA: PSA: 1.11 ng/mL (ref 0.10–4.00)

## 2023-08-03 ENCOUNTER — Telehealth: Payer: Self-pay | Admitting: Nurse Practitioner

## 2023-08-03 NOTE — Telephone Encounter (Signed)
Unfortunately no, he did not have any recent labs to assess his thyroid function, therefore he will need to have a separate blood draw for them.

## 2023-08-03 NOTE — Telephone Encounter (Signed)
Pt called stating he did labs and wanted to know if those would suffice.

## 2023-08-04 NOTE — Telephone Encounter (Signed)
Called and let pt know

## 2023-08-27 ENCOUNTER — Other Ambulatory Visit: Payer: Self-pay | Admitting: Family

## 2023-08-28 ENCOUNTER — Telehealth: Payer: Self-pay | Admitting: Internal Medicine

## 2023-08-28 DIAGNOSIS — I4892 Unspecified atrial flutter: Secondary | ICD-10-CM

## 2023-08-28 DIAGNOSIS — I272 Pulmonary hypertension, unspecified: Secondary | ICD-10-CM

## 2023-08-28 NOTE — Telephone Encounter (Signed)
Patient called in to follow up on this refill. He stated that he is having an echo done soon and need the medication for it. Thank you!

## 2023-08-28 NOTE — Telephone Encounter (Signed)
New Message:     Patient says he needs to have an Echo per his job.  He would like for Dr Ladona Ridgel to please order that for him.

## 2023-08-28 NOTE — Telephone Encounter (Signed)
pT stated that he is having an echo done soon and need the medication for it.    LAST APPOINTMENT DATE: 07/22/2023   NEXT APPOINTMENT DATE: 01/28/2024  Methimazole 10mg   LAST REFILL: 07/05/23  QTY: #unknown

## 2023-08-28 NOTE — Telephone Encounter (Signed)
Ok to order 2D echo to followup pulmonary HTN in a patient with a  h/o atrial flutter. GT

## 2023-09-01 ENCOUNTER — Telehealth: Payer: Self-pay | Admitting: *Deleted

## 2023-09-01 ENCOUNTER — Telehealth: Payer: Self-pay | Admitting: Nurse Practitioner

## 2023-09-01 DIAGNOSIS — E0590 Thyrotoxicosis, unspecified without thyrotoxic crisis or storm: Secondary | ICD-10-CM

## 2023-09-01 MED ORDER — METHIMAZOLE 5 MG PO TABS
10.0000 mg | ORAL_TABLET | Freq: Every day | ORAL | 0 refills | Status: AC
Start: 2023-09-01 — End: ?

## 2023-09-01 NOTE — Telephone Encounter (Signed)
Pt made aware he has to keep his appt with lab work a week prior to appt

## 2023-09-01 NOTE — Telephone Encounter (Signed)
Order for echo placed and will send a message to our Echo Scheduler to call the pt back to arrange this appt.   Will cc Dr. Lubertha Basque RN in on this, to follow-up on echo appt.

## 2023-09-01 NOTE — Telephone Encounter (Signed)
I will give him a limited supply until he can get back in the office.  Future refills will be denied unless he has OV.  Remind him to get labs done

## 2023-09-01 NOTE — Telephone Encounter (Signed)
Pt is requesting a refill on his Methimazole. He said that [facility] on [address]

## 2023-09-01 NOTE — Telephone Encounter (Signed)
Patient was called and made aware that a 1 month supply of the Methimazole was called in for him, he will need to go and have his lab work drawn prior to further refills.

## 2023-09-01 NOTE — Telephone Encounter (Signed)
Patient is following up. He would like a call back to confirm when order has been placed.

## 2023-09-07 ENCOUNTER — Telehealth: Payer: Self-pay | Admitting: Internal Medicine

## 2023-09-07 NOTE — Telephone Encounter (Signed)
Returned pt's call. Pt would like to know specifically what his EF % was last year so that he can compare it to the Echo he is getting at the end of the month. Told pt I would speak with Dr Ladona Ridgel once he is back in the office and give him a call. (Echo was uploaded and not resulted) Pt stated understanding.

## 2023-09-07 NOTE — Telephone Encounter (Signed)
Patient is calling with questions regarding the Echo he had on 02/09/22 and the Ablation on 02/11/22. Please advise.

## 2023-09-16 NOTE — Telephone Encounter (Signed)
Returned pts call. Voice mail is not set up and could not left a message.

## 2023-09-21 ENCOUNTER — Ambulatory Visit (HOSPITAL_COMMUNITY): Payer: BC Managed Care – PPO | Attending: Internal Medicine

## 2023-09-21 DIAGNOSIS — I272 Pulmonary hypertension, unspecified: Secondary | ICD-10-CM | POA: Insufficient documentation

## 2023-09-21 DIAGNOSIS — I4892 Unspecified atrial flutter: Secondary | ICD-10-CM | POA: Diagnosis not present

## 2023-09-21 LAB — ECHOCARDIOGRAM COMPLETE
Area-P 1/2: 3.19 cm2
S' Lateral: 3.5 cm

## 2023-09-22 NOTE — Telephone Encounter (Signed)
Voicemail not setup. Could not leave message

## 2023-09-24 DIAGNOSIS — Z125 Encounter for screening for malignant neoplasm of prostate: Secondary | ICD-10-CM | POA: Diagnosis not present

## 2023-09-24 NOTE — Telephone Encounter (Signed)
Patient is returning call.  °

## 2023-09-25 NOTE — Telephone Encounter (Signed)
Patient is following up. He states this matter is urgent and he is hopeful to speak with the nurse soon.

## 2023-09-25 NOTE — Telephone Encounter (Signed)
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 Faxed to [facility] at [phone] per patient request.  Encouraged patient to sign up for MyChart to view his results. Dr. Ladona Ridgel still needs to review and comment on his echo results.  Will forward to Dr. Lubertha Basque nurse to follow-up with patient when she returns to office next week. Patient verbalized understanding and expressed appreciation for assistance today.

## 2023-10-01 ENCOUNTER — Other Ambulatory Visit: Payer: Self-pay | Admitting: Nurse Practitioner

## 2023-10-01 DIAGNOSIS — E0590 Thyrotoxicosis, unspecified without thyrotoxic crisis or storm: Secondary | ICD-10-CM

## 2023-10-01 DIAGNOSIS — N200 Calculus of kidney: Secondary | ICD-10-CM | POA: Diagnosis not present

## 2023-10-05 NOTE — Telephone Encounter (Signed)
Correct, cannot refill his meds without an OV first and he needs to have the labs done prior to that.

## 2023-10-05 NOTE — Telephone Encounter (Signed)
Pt called and said he did not do labs. I advised him that per last phone note, no refills until labs are done.

## 2023-10-06 ENCOUNTER — Ambulatory Visit: Payer: BC Managed Care – PPO | Admitting: Nurse Practitioner

## 2023-10-06 DIAGNOSIS — E0590 Thyrotoxicosis, unspecified without thyrotoxic crisis or storm: Secondary | ICD-10-CM

## 2023-10-07 NOTE — Telephone Encounter (Signed)
See result note.  

## 2023-10-12 DIAGNOSIS — E0590 Thyrotoxicosis, unspecified without thyrotoxic crisis or storm: Secondary | ICD-10-CM | POA: Diagnosis not present

## 2023-10-13 LAB — T4, FREE: Free T4: 0.84 ng/dL (ref 0.82–1.77)

## 2023-10-13 LAB — TSH: TSH: 3.48 u[IU]/mL (ref 0.450–4.500)

## 2023-10-13 LAB — T3, FREE: T3, Free: 2.9 pg/mL (ref 2.0–4.4)

## 2023-10-14 ENCOUNTER — Ambulatory Visit (INDEPENDENT_AMBULATORY_CARE_PROVIDER_SITE_OTHER): Payer: BC Managed Care – PPO | Admitting: Nurse Practitioner

## 2023-10-14 ENCOUNTER — Encounter: Payer: Self-pay | Admitting: Nurse Practitioner

## 2023-10-14 VITALS — BP 138/80 | HR 74 | Ht 72.0 in | Wt 263.6 lb

## 2023-10-14 DIAGNOSIS — E0590 Thyrotoxicosis, unspecified without thyrotoxic crisis or storm: Secondary | ICD-10-CM

## 2023-10-14 MED ORDER — METHIMAZOLE 5 MG PO TABS: 5.0000 mg | ORAL_TABLET | Freq: Every day | ORAL | 1 refills | Status: DC

## 2023-10-14 NOTE — Progress Notes (Signed)
10/14/2023     Endocrinology Follow Up Note    Subjective:    Patient ID: Spencer English, male    DOB: 12-30-1965, PCP Eden Emms, NP.   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   Leukopenia    Long term (current) use of anticoagulants 04/03/2011   PULMONARY EMBOLISM 05/03/2009   Sleep apnea    CPAP at bedtime   UNSPECIFIED URINARY CALCULUS     Past Surgical History:  Procedure Laterality Date   A-FLUTTER ABLATION N/A 02/11/2022   Procedure: A-FLUTTER ABLATION;  Surgeon: Marinus Maw, MD;  Location: [facility] INVASIVE CV LAB;  Service: Cardiovascular;  Laterality: N/A;   ANTERIOR CRUCIATE LIGAMENT REPAIR  2000   Right   CYSTECTOMY     back of head   KNEE ARTHROSCOPY     left knee   KNEE ARTHROSCOPY  01/03/2013   Procedure: ARTHROSCOPY KNEE;  Surgeon: Thera Flake., MD;  Location: [facility] OR;  Service: Orthopedics;  Laterality: Left;  Lavage Synovectomy, Removal of loose body   KNEE ARTHROSCOPY W/ ACL RECONSTRUCTION     right knee   TEE WITHOUT CARDIOVERSION N/A 02/11/2022   Procedure: TRANSESOPHAGEAL ECHOCARDIOGRAM (TEE);  Surgeon: Marinus Maw, MD;  Location: [facility] INVASIVE CV LAB;  Service: Cardiovascular;  Laterality: N/A;    Social History   Socioeconomic History   Marital status: Married    Spouse name: Marcelino Duster   Number of children: 3   Years of education: high school   Highest education level: Not on file  Occupational History   Occupation: Truck Air traffic controller: [employer]    Employer: [employer]  Tobacco Use   Smoking  status: Every Day    Types: Cigars   Smokeless tobacco: Never  Vaping Use   Vaping status: Never Used  Substance and Sexual Activity   Alcohol use: Yes    Alcohol/week: 2.0 - 4.0 standard drinks of alcohol    Types: 2 - 4 Cans of beer per week    Comment: 2-24oz beers weekends only 03/05/22   Drug use: No   Sexual activity: Not on file  Other Topics Concern   Not on file  Social History Narrative   11/01/20   From: the area   Living: with wife, Marcelino Duster (1990)   Work: truck Hospital doctor - [employer]      Family: 3 adult children - [place], Sophia, Elenor [place]      Enjoys: play basketball, fish, spend time with family      Exercise: stationary bike - rare use   Diet: grilled and baked food      Safety   Seat belts: Yes    Guns: No   Safe in relationships: Yes    Social Determinants of Corporate investment banker Strain: Not on file  Food Insecurity: Not on file  Transportation Needs: Not on file  Physical Activity: Not on file  Stress: Not on file  Social Connections: Unknown (05/11/2022)   [facility] from [facility],  [facility]   Social Network    Social Network: Not on file    Family History  Problem Relation Age of Onset   Diabetes Mother    Hypertension Mother    Hyperlipidemia Mother    Dementia Mother    Diabetes Father    Hypertension Father    Hyperlipidemia Father    Heart attack Father    Heart disease Father    Prostate cancer Father 75   Stomach cancer Neg Hx    Colon cancer Neg Hx    Pancreatic cancer Neg Hx    Esophageal cancer Neg Hx    Rectal cancer Neg Hx     Outpatient Encounter Medications as of 10/14/2023  Medication Sig   allopurinol (ZYLOPRIM) 300 MG tablet Take by mouth.   amLODipine (NORVASC) 5 MG tablet Take 1 tablet (5 mg total) by mouth daily.   atorvastatin (LIPITOR) 40 MG tablet Take 1 tablet (40 mg total) by mouth daily.   dapagliflozin propanediol (FARXIGA) 5 MG TABS tablet Take 1 tablet (5 mg total) by mouth daily.   furosemide  (LASIX) 40 MG tablet Take 1 tablet (40 mg total) by mouth daily.   glucose blood (CONTOUR NEXT TEST) test strip 1 each by Other route daily. And lancets 1/day   losartan (COZAAR) 100 MG tablet Take 1 tablet (100 mg total) by mouth daily.   methimazole (TAPAZOLE) 5 MG tablet Take 1 tablet (5 mg total) by mouth daily.   metoprolol succinate (TOPROL-XL) 50 MG 24 hr tablet Take 1 tablet (50 mg total) by mouth daily.   rivaroxaban (XARELTO) 20 MG TABS tablet Take 1 tablet (20 mg total) by mouth every morning.   sitaGLIPtin (JANUVIA) 100 MG tablet Take 1 tablet (100 mg total) by mouth daily.   [DISCONTINUED] methimazole (TAPAZOLE) 10 MG tablet Take 10 mg by mouth daily.   Meclizine HCl 25 MG CHEW Chew by mouth. (Patient not taking: Reported on 07/22/2023)   polyvinyl alcohol (LIQUIFILM TEARS) 1.4 % ophthalmic solution Place 1 drop into both eyes as needed for dry eyes. (Patient not taking: Reported on 07/22/2023)   verapamil (VERELAN) 240 MG 24 hr capsule Take by mouth. (Patient not taking: Reported on 07/22/2023)   [DISCONTINUED] methimazole (TAPAZOLE) 5 MG tablet TAKE 2 TABLETS(10 MG) BY MOUTH DAILY (Patient not taking: Reported on 10/14/2023)   No facility-administered encounter medications on file as of 10/14/2023.    ALLERGIES: Allergies  Allergen Reactions   Metformin Diarrhea, Nausea Only and Other (See Comments)   Viagra [Sildenafil Citrate] Other (See Comments)    headache    VACCINATION STATUS: Immunization History  Administered Date(s) Administered   Hepatitis B, ADULT 06/02/2017, 07/07/2017, 12/30/2017   Influenza Split 09/06/2012   Influenza Whole 11/10/2008, 09/24/2009, 10/24/2010   Influenza,inj,Quad PF,6+ Mos 10/10/2013, 10/20/2014, 09/05/2015, 03/09/2017, 09/15/2018, 09/02/2019, 10/14/2020   Influenza-Unspecified 10/04/2022   Moderna Covid-19 Vaccine Bivalent Booster 27yrs & up 11/23/2021   Moderna Sars-Covid-2 Vaccination 02/26/2020, 03/31/2020, 10/20/2020   Rubella  03/02/1979   Td 12/30/2007     HPI  Spencer English is 57 y.o. male who presents today with a medical history as above. he is being seen in follow up after being seen in consultation for hyperthyroidism requested by Eden Emms, NP.  He was previously being managed by Dr. Everardo All for his hyperthyroidism.  Prior to his initial diagnosis around 10 years ago, he had symptoms of anxiety, palpitations, weight loss.  He was put on Methimazole at that time and has  done well ever since.  He notes he did discuss potential RAI ablation with Dr. Everardo All but then it was never discussed again. He has had cardiac ablation for Afib in the past.  he denies dysphagia, choking, shortness of breath, no recent voice change.    he denies family history of thyroid dysfunction and denies family hx of thyroid cancer. he denies personal history of goiter. he is not on any thyroid hormone supplements.  He is currently taking Methimazole 10 mg po daily. Denies use of Biotin containing supplements.  he is willing to proceed with appropriate work up and therapy for thyrotoxicosis.   Review of systems  Constitutional: + Minimally fluctuating body weight,  current Body mass index is 35.75 kg/m. , no fatigue, no subjective hyperthermia, no subjective hypothermia Eyes: no blurry vision, no xerophthalmia ENT: no sore throat, no nodules palpated in throat, no dysphagia/odynophagia, no hoarseness Cardiovascular: no chest pain, no shortness of breath, no palpitations, no leg swelling Respiratory: no cough, no shortness of breath Gastrointestinal: no nausea/vomiting/diarrhea Musculoskeletal: no muscle/joint aches Skin: no rashes, no hyperemia Neurological: no tremors, no numbness, no tingling, no dizziness Psychiatric: no depression, no anxiety   Objective:    BP 138/80 (BP Location: Right Arm, Patient Position: Sitting, Cuff Size: Large)   Pulse 74   Ht 6' (1.829 m)   Wt 263 lb 9.6 oz (119.6 kg)   BMI 35.75 kg/m    Wt Readings from Last 3 Encounters:  10/14/23 263 lb 9.6 oz (119.6 kg)  07/22/23 263 lb 9.6 oz (119.6 kg)  05/20/23 256 lb 12.8 oz (116.5 kg)     BP Readings from Last 3 Encounters:  10/14/23 138/80  07/22/23 110/80  05/20/23 133/87                          Physical Exam- Limited  Constitutional:  Body mass index is 35.75 kg/m. , not in acute distress, normal state of mind Eyes:  EOMI, no exophthalmos Musculoskeletal: no gross deformities, strength intact in all four extremities, no gross restriction of joint movements Skin:  no rashes, no hyperemia Neurological: no tremor with outstretched hands, DTR normal in BLE   CMP     Component Value Date/Time   NA 135 07/22/2023 1500   NA 141 10/06/2018 1449   K 4.5 07/22/2023 1500   CL 99 07/22/2023 1500   CO2 30 07/22/2023 1500   GLUCOSE 100 (H) 07/22/2023 1500   GLUCOSE 112 (H) 10/30/2006 1637   BUN 9 07/22/2023 1500   BUN 7 10/06/2018 1449   CREATININE 1.23 07/22/2023 1500   CREATININE 1.12 02/18/2013 1859   CALCIUM 8.7 07/22/2023 1500   PROT 7.1 07/22/2023 1500   ALBUMIN 3.9 07/22/2023 1500   AST 16 07/22/2023 1500   ALT 13 07/22/2023 1500   ALKPHOS 71 07/22/2023 1500   BILITOT 1.1 07/22/2023 1500   GFRNONAA >60 02/15/2022 1358   GFRAA >60 12/07/2018 2016     CBC    Component Value Date/Time   WBC 4.7 07/22/2023 1500   RBC 5.19 07/22/2023 1500   HGB 14.5 07/22/2023 1500   HCT 45.6 07/22/2023 1500   PLT 197.0 07/22/2023 1500   MCV 87.7 07/22/2023 1500   MCH 26.6 02/15/2022 1358   MCHC 31.9 07/22/2023 1500   RDW 16.1 (H) 07/22/2023 1500   LYMPHSABS 1.0 02/15/2022 1358   MONOABS 0.4 02/15/2022 1358   EOSABS 0.2 02/15/2022 1358   BASOSABS 0.1 02/15/2022 1358  Diabetic Labs (most recent): Lab Results  Component Value Date   HGBA1C 6.7 (A) 07/22/2023   HGBA1C 6.7 (A) 08/06/2022   HGBA1C 6.7 (H) 02/08/2022   MICROALBUR <0.7 07/22/2023   MICROALBUR 1.9 03/25/2018   MICROALBUR 1.4 03/09/2017     Lipid Panel     Component Value Date/Time   CHOL 172 07/22/2023 1500   TRIG 44.0 07/22/2023 1500   TRIG 55 10/30/2006 1637   HDL 48.90 07/22/2023 1500   CHOLHDL 4 07/22/2023 1500   VLDL 8.8 07/22/2023 1500   LDLCALC 114 (H) 07/22/2023 1500   LDLDIRECT 147.2 10/10/2013 1402     Lab Results  Component Value Date   TSH 3.480 10/12/2023   TSH 0.523 02/08/2022   TSH 1.18 07/19/2020   TSH 1.36 07/16/2020   TSH 1.52 08/17/2018   TSH 1.31 03/25/2018   TSH 1.37 10/07/2017   TSH 1.73 03/09/2017   TSH 1.76 10/19/2015   TSH 1.61 06/27/2014   FREET4 0.84 10/12/2023   FREET4 0.87 07/19/2020   FREET4 0.89 03/25/2018   FREET4 0.93 10/07/2017   FREET4 0.61 06/27/2014   FREET4 0.97 10/27/2013   FREET4 1.21 02/18/2013   FREET4 2.36 (H) 01/26/2013      Latest Reference Range & Units 03/25/18 16:54 08/17/18 08:50 07/16/20 00:00 07/19/20 08:39 02/08/22 12:47 10/12/23 16:52  TSH 0.450 - 4.500 uIU/mL 1.31 1.52 1.36 (E) 1.18 0.523 3.480  Triiodothyronine,Free,Serum 2.0 - 4.4 pg/mL      2.9  T4,Free(Direct) 0.82 - 1.77 ng/dL 4.09   8.11  9.14  (E): External lab result   Assessment & Plan:   1) Hyperthyroidism  he is being seen at a kind request of Eden Emms, NP.  his history and most recent labs are reviewed, and he was examined clinically. Subjective and objective findings are consistent with thyrotoxicosis likely from primary hyperthyroidism. The potential risks of untreated thyrotoxicosis and the need for definitive therapy have been discussed in detail with him, and he agrees to proceed with diagnostic workup and treatment plan.  He is currently on Methimazole 10 mg po daily.  Based on his recent labs, his thyroid has responded well to the Methimazole, so much so that we can reduce his dose to 5 mg po daily.  Will plan to repeat labs in 3 months and continue tapering off if his body allows.   It is very important he keep his follow up appointments and we discussed this  today.  -He is interested in RAI ablation if necessary to take care of this once and for all.   Options of therapy are discussed with him.  We discussed the option of treating it with medications including methimazole or PTU which may have side effects including rash, transaminitis, and bone marrow suppression.  We also discussed the option of definitive therapy with RAI ablation of the thyroid. If he is found to have primary hyperthyroidism from Graves' disease , toxic multinodular goiter or toxic nodular goiter the preferred modality of treatment would be I-131 thyroid ablation. Surgery is another choice of treatment in some cases, in his case surgery is not a good fit for presentation with only mild goiter.  -Patient is made aware of the high likelihood of post ablative hypothyroidism with subsequent need for lifelong thyroid hormone replacement. he understands this outcome and he is  willing to proceed.         -Patient is advised to maintain close follow up with Eden Emms, NP for primary care needs.  I spent  26  minutes in the care of the patient today including review of labs from Thyroid Function, CMP, and other relevant labs ; imaging/biopsy records (current and previous including abstractions from other facilities); face-to-face time discussing  his lab results and symptoms, medications doses, his options of short and long term treatment based on the latest standards of care / guidelines;   and documenting the encounter.  Spencer English  participated in the discussions, expressed understanding, and voiced agreement with the above plans.  All questions were answered to his satisfaction. he is encouraged to contact clinic should he have any questions or concerns prior to his return visit.  Follow up plan: Return in about 3 months (around 01/14/2024) for Thyroid follow up, Previsit labs.   Thank you for involving me in the care of this pleasant patient, and I will continue to  update you with his progress.  Ronny Bacon, [facility] [address] [place], [place] [address] Phone: [phone] Fax: [phone]  10/14/2023, 1:27 PM

## 2023-11-18 ENCOUNTER — Other Ambulatory Visit: Payer: Self-pay

## 2023-11-18 ENCOUNTER — Emergency Department (HOSPITAL_BASED_OUTPATIENT_CLINIC_OR_DEPARTMENT_OTHER)
Admission: EM | Admit: 2023-11-18 | Discharge: 2023-11-18 | Disposition: A | Discharge status: Home / Self Care | Payer: BC Managed Care – PPO | Attending: Emergency Medicine | Admitting: Emergency Medicine

## 2023-11-18 ENCOUNTER — Encounter (HOSPITAL_BASED_OUTPATIENT_CLINIC_OR_DEPARTMENT_OTHER): Payer: Self-pay | Admitting: Emergency Medicine

## 2023-11-18 DIAGNOSIS — X58XXXA Exposure to other specified factors, initial encounter: Secondary | ICD-10-CM | POA: Diagnosis not present

## 2023-11-18 DIAGNOSIS — I10 Essential (primary) hypertension: Secondary | ICD-10-CM | POA: Insufficient documentation

## 2023-11-18 DIAGNOSIS — S00411A Abrasion of right ear, initial encounter: Secondary | ICD-10-CM | POA: Diagnosis not present

## 2023-11-18 DIAGNOSIS — E119 Type 2 diabetes mellitus without complications: Secondary | ICD-10-CM | POA: Insufficient documentation

## 2023-11-18 DIAGNOSIS — Z7901 Long term (current) use of anticoagulants: Secondary | ICD-10-CM | POA: Insufficient documentation

## 2023-11-18 NOTE — ED Notes (Signed)
DR plunkett at to see at triage

## 2023-11-18 NOTE — ED Triage Notes (Signed)
"  I got a bug in my ear" Noticed yesterday.

## 2023-11-18 NOTE — ED Notes (Signed)
Reviewed AVS/discharge instruction with patient. Time allotted for and all questions answered. Patient is agreeable for d/c and escorted to ed exit by staff.  

## 2023-11-18 NOTE — Discharge Instructions (Signed)
No bugs in the ear canal today just a scrape.  Do not need to do anything it will heal on it's own.  Avoid qtips or putting anything iin your ear.

## 2023-11-18 NOTE — ED Provider Notes (Signed)
[place] EMERGENCY DEPARTMENT AT [facility] Provider Note   CSN: [id number] Arrival date & time: 11/18/23  1454     History  Chief Complaint  Patient presents with   Foreign Body in Ear    BORYS MAROLDA is a 57 y.o. male.  Pt is a 57y/o male with hx of HTN, DM who is presenting because he is afraid there is something in his ear.  He slept at his girlfriends house and she has roaches and he felt something on his face and was afraid it got in his ear.  He denies ear pain or change in hearing.  He flushed the ear but did not see anything.  No other complaints today.  The history is provided by the patient.  Foreign Body in Ear       Home Medications Prior to Admission medications   Medication Sig Start Date End Date Taking? Authorizing Provider  allopurinol (ZYLOPRIM) 300 MG tablet Take by mouth. 12/07/18   [provider]  amLODipine (NORVASC) 5 MG tablet Take 1 tablet (5 mg total) by mouth daily. 07/22/23   Eden Emms, NP  atorvastatin (LIPITOR) 40 MG tablet Take 1 tablet (40 mg total) by mouth daily. 07/22/23   Eden Emms, NP  dapagliflozin propanediol (FARXIGA) 5 MG TABS tablet Take 1 tablet (5 mg total) by mouth daily. 07/22/23   Eden Emms, NP  furosemide (LASIX) 40 MG tablet Take 1 tablet (40 mg total) by mouth daily. 07/22/23   Eden Emms, NP  glucose blood (CONTOUR NEXT TEST) test strip 1 each by Other route daily. And lancets 1/day 09/02/19   Romero Belling, MD  losartan (COZAAR) 100 MG tablet Take 1 tablet (100 mg total) by mouth daily. 07/22/23   Eden Emms, NP  Meclizine HCl 25 MG CHEW Chew by mouth. Patient not taking: Reported on 07/22/2023 05/26/17   [provider]  methimazole (TAPAZOLE) 5 MG tablet Take 1 tablet (5 mg total) by mouth daily. 10/14/23   Dani Gobble, NP  metoprolol succinate (TOPROL-XL) 50 MG 24 hr tablet Take 1 tablet (50 mg total) by mouth daily. 07/22/23   Eden Emms, NP  polyvinyl alcohol  (LIQUIFILM TEARS) 1.4 % ophthalmic solution Place 1 drop into both eyes as needed for dry eyes. Patient not taking: Reported on 07/22/2023    [provider]  rivaroxaban (XARELTO) 20 MG TABS tablet Take 1 tablet (20 mg total) by mouth every morning. 03/17/23   Mort Sawyers, FNP  sitaGLIPtin (JANUVIA) 100 MG tablet Take 1 tablet (100 mg total) by mouth daily. 07/22/23   Eden Emms, NP  verapamil (VERELAN) 240 MG 24 hr capsule Take by mouth. Patient not taking: Reported on 07/22/2023 05/26/17   [provider]      Allergies    Metformin and Viagra [sildenafil citrate]    Review of Systems   Review of Systems  Physical Exam Updated Vital Signs BP (!) 151/95 (BP Location: Right Arm)   Pulse 78   Temp (!) 97.5 F (36.4 C)   Resp 17   SpO2 100%  Physical Exam Vitals and nursing note reviewed.  HENT:     Head: Normocephalic.     Right Ear: Tympanic membrane and external ear normal. There is no impacted cerumen.     Left Ear: Tympanic membrane, ear canal and external ear normal.     Ears:     Comments: Small abrasion to the right ear canal  otherwise normal Cardiovascular:     Rate and Rhythm: Normal rate.  Pulmonary:     Effort: Pulmonary effort is normal.  Neurological:     Mental Status: He is alert. Mental status is at baseline.     ED Results / Procedures / Treatments   Labs (all labs ordered are listed, but only abnormal results are displayed) Labs Reviewed - No data to display  EKG None  Radiology No results found.  Procedures Procedures    Medications Ordered in ED Medications - No data to display  ED Course/ Medical Decision Making/ A&P                                 Medical Decision Making  Pt here due to concern for FB in the ear.  Pt does not have a FB and ear is normal.  Pt is HTN here but states he forgot his medication yesterday.  He denies other symptoms at this time.  He did take his meds today and will continue taking them  and checking it at home.  Last BP in sept was 120/70.        Final Clinical Impression(s) / ED Diagnoses Final diagnoses:  Ear canal abrasion, right, initial encounter    Rx / DC Orders ED Discharge Orders     None         Gwyneth Sprout, MD 11/18/23 1515

## 2023-12-09 ENCOUNTER — Telehealth: Payer: Self-pay

## 2023-12-09 NOTE — Telephone Encounter (Signed)
Transition Care Management Follow-up Telephone Call Date of discharge and from where: [facility] 11/20 How have you been since you were released from the hospital? Patient has not followed up with providers but is doing well Any questions or concerns? No  Items Reviewed: Did the pt receive and understand the discharge instructions provided? Yes  Medications obtained and verified? Yes Other? No  Any new allergies since your discharge? No  Dietary orders reviewed? No Do you have support at home? Yes     Follow up appointments reviewed:  PCP Hospital f/u appt confirmed? No  Scheduled to see  on  @ . Specialist Hospital f/u appt confirmed? No  Scheduled to see  on  @ . Are transportation arrangements needed? No  If their condition worsens, is the pt aware to call PCP or go to the Emergency Dept.? Yes Was the patient provided with contact information for the PCP's office or ED? Yes Was to pt encouraged to call back with questions or concerns? Yes

## 2024-01-13 ENCOUNTER — Ambulatory Visit: Payer: BC Managed Care – PPO | Admitting: Podiatry

## 2024-01-20 ENCOUNTER — Ambulatory Visit: Payer: BC Managed Care – PPO | Admitting: Nurse Practitioner

## 2024-01-20 DIAGNOSIS — E0590 Thyrotoxicosis, unspecified without thyrotoxic crisis or storm: Secondary | ICD-10-CM

## 2024-01-21 ENCOUNTER — Ambulatory Visit: Payer: BC Managed Care – PPO | Admitting: Podiatry

## 2024-01-28 ENCOUNTER — Ambulatory Visit: Payer: BC Managed Care – PPO | Admitting: Nurse Practitioner

## 2024-01-29 ENCOUNTER — Encounter: Payer: Self-pay | Admitting: Nurse Practitioner

## 2024-02-04 ENCOUNTER — Encounter: Payer: Self-pay | Admitting: Podiatry

## 2024-02-04 ENCOUNTER — Ambulatory Visit (INDEPENDENT_AMBULATORY_CARE_PROVIDER_SITE_OTHER): Payer: BC Managed Care – PPO

## 2024-02-04 ENCOUNTER — Ambulatory Visit: Payer: BC Managed Care – PPO | Admitting: Podiatry

## 2024-02-04 DIAGNOSIS — M722 Plantar fascial fibromatosis: Secondary | ICD-10-CM

## 2024-02-04 DIAGNOSIS — G8929 Other chronic pain: Secondary | ICD-10-CM

## 2024-02-04 DIAGNOSIS — M79672 Pain in left foot: Secondary | ICD-10-CM

## 2024-02-04 MED ORDER — TRIAMCINOLONE ACETONIDE 10 MG/ML IJ SUSP
10.0000 mg | Freq: Once | INTRAMUSCULAR | Status: AC
Start: 2024-02-04 — End: 2024-02-04
  Administered 2024-02-04: 10 mg via INTRA_ARTICULAR

## 2024-02-04 MED ORDER — DICLOFENAC SODIUM 75 MG PO TBEC: 75.0000 mg | DELAYED_RELEASE_TABLET | Freq: Two times a day (BID) | ORAL | 2 refills | Status: AC

## 2024-02-05 NOTE — Progress Notes (Signed)
 Subjective:   Patient ID: Spencer English, male   DOB: 58 y.o.   MRN: [id number]   HPI Patient presents with a lot of pain underneath his left heel stating it has gotten sore recently.  Patient's not been seen for a number of years and has been relatively healthy does not smoke likes to be active   Review of Systems  All other systems reviewed and are negative.       Objective:  Physical Exam Vitals and nursing note reviewed.  Constitutional:      Appearance: He is well-developed.  Pulmonary:     Effort: Pulmonary effort is normal.  Musculoskeletal:        General: Normal range of motion.  Skin:    General: Skin is warm.  Neurological:     Mental Status: He is alert.     Neurovascular status intact muscle strength adequate range of motion adequate exquisite discomfort medial and central fascial band left at the insertional point tendon calcaneus fluid buildup moderate flat arch height noted.  Good digital perfusion well-oriented x 3     Assessment:  Acute plantar fasciitis left with inflammation fluid around the medial band     Plan:  H&P reviewed sterile prep injected the fascial band 3 mg Kenalog  5 mg Xylocaine  advised on support therapy placed on oral anti-inflammatory reappoint 2 weeks to recheck and may require orthotics  X-rays indicate moderate depression of the arch with spur formation no indication stress fracture

## 2024-02-19 ENCOUNTER — Ambulatory Visit: Payer: BC Managed Care – PPO | Admitting: Podiatry

## 2024-02-25 ENCOUNTER — Emergency Department (HOSPITAL_BASED_OUTPATIENT_CLINIC_OR_DEPARTMENT_OTHER)
Admission: EM | Admit: 2024-02-25 | Discharge: 2024-02-25 | Disposition: A | Discharge status: Home / Self Care | Payer: BC Managed Care – PPO | Attending: Emergency Medicine | Admitting: Emergency Medicine

## 2024-02-25 ENCOUNTER — Other Ambulatory Visit: Payer: Self-pay

## 2024-02-25 ENCOUNTER — Encounter (HOSPITAL_BASED_OUTPATIENT_CLINIC_OR_DEPARTMENT_OTHER): Payer: Self-pay

## 2024-02-25 DIAGNOSIS — N189 Chronic kidney disease, unspecified: Secondary | ICD-10-CM | POA: Insufficient documentation

## 2024-02-25 DIAGNOSIS — E1122 Type 2 diabetes mellitus with diabetic chronic kidney disease: Secondary | ICD-10-CM | POA: Insufficient documentation

## 2024-02-25 DIAGNOSIS — J210 Acute bronchiolitis due to respiratory syncytial virus: Secondary | ICD-10-CM | POA: Insufficient documentation

## 2024-02-25 DIAGNOSIS — Z7901 Long term (current) use of anticoagulants: Secondary | ICD-10-CM | POA: Diagnosis not present

## 2024-02-25 DIAGNOSIS — R059 Cough, unspecified: Secondary | ICD-10-CM | POA: Diagnosis not present

## 2024-02-25 LAB — RESP PANEL BY RT-PCR (RSV, FLU A&B, COVID)  RVPGX2
Influenza A by PCR: NEGATIVE
Influenza B by PCR: NEGATIVE
Resp Syncytial Virus by PCR: POSITIVE — AB
SARS Coronavirus 2 by RT PCR: NEGATIVE

## 2024-02-25 MED ORDER — BENZONATATE 100 MG PO CAPS: 100.0000 mg | ORAL_CAPSULE | Freq: Three times a day (TID) | ORAL | 0 refills | Status: AC

## 2024-02-25 NOTE — ED Provider Notes (Signed)
 [place] EMERGENCY DEPARTMENT AT [facility] Provider Note   CSN: [id number] Arrival date & time: 02/25/24  1023     History  Chief Complaint  Patient presents with   URI    Spencer English is a 58 y.o. male with past medical history of pulmonary hypertension, diabetes, pulmonary embolism on Xarelto, atrial fibrillation, obstructive sleep apnea on CPAP, chronic kidney disease presenting to the emergency room with 4 days of congestion, sinus pressure, dry cough.  Patient reports his cough is improving and that he has not been coughing much today. Never had productive cough. Patient reports that he was recently around his girlfriend who was sick with possible RSV or the flu.  He has not had any fever or chills.  Denies any otalgia.  Denies any chest pain or shortness of breath.  No abdominal pain nausea vomiting diarrhea.  He has not tried anything for his symptoms.   URI Presenting symptoms: congestion        Home Medications Prior to Admission medications   Medication Sig Start Date End Date Taking? Authorizing Provider  allopurinol (ZYLOPRIM) 300 MG tablet Take by mouth. 12/07/18   [provider]  amLODipine (NORVASC) 5 MG tablet Take 1 tablet (5 mg total) by mouth daily. 07/22/23   Eden Emms, NP  atorvastatin (LIPITOR) 40 MG tablet Take 1 tablet (40 mg total) by mouth daily. 07/22/23   Eden Emms, NP  dapagliflozin propanediol (FARXIGA) 5 MG TABS tablet Take 1 tablet (5 mg total) by mouth daily. 07/22/23   Eden Emms, NP  diclofenac (VOLTAREN) 75 MG EC tablet Take 1 tablet (75 mg total) by mouth 2 (two) times daily. 02/04/24   Lenn Sink, DPM  furosemide (LASIX) 40 MG tablet Take 1 tablet (40 mg total) by mouth daily. 07/22/23   Eden Emms, NP  glucose blood (CONTOUR NEXT TEST) test strip 1 each by Other route daily. And lancets 1/day 09/02/19   Romero Belling, MD  losartan (COZAAR) 100 MG tablet Take 1 tablet (100 mg total) by mouth daily. 07/22/23    Eden Emms, NP  Meclizine HCl 25 MG CHEW Chew by mouth. 05/26/17   [provider]  methimazole (TAPAZOLE) 5 MG tablet Take 1 tablet (5 mg total) by mouth daily. 10/14/23   Dani Gobble, NP  metoprolol succinate (TOPROL-XL) 50 MG 24 hr tablet Take 1 tablet (50 mg total) by mouth daily. 07/22/23   Eden Emms, NP  polyvinyl alcohol (LIQUIFILM TEARS) 1.4 % ophthalmic solution Place 1 drop into both eyes as needed for dry eyes.    [provider]  rivaroxaban (XARELTO) 20 MG TABS tablet Take 1 tablet (20 mg total) by mouth every morning. 03/17/23   Mort Sawyers, FNP  sitaGLIPtin (JANUVIA) 100 MG tablet Take 1 tablet (100 mg total) by mouth daily. 07/22/23   Eden Emms, NP  verapamil (VERELAN) 240 MG 24 hr capsule Take by mouth. 05/26/17   [provider]      Allergies    Metformin and Viagra [sildenafil citrate]    Review of Systems   Review of Systems  HENT:  Positive for congestion.     Physical Exam Updated Vital Signs BP (!) 152/100 (BP Location: Right Arm)   Pulse 73   Temp 98.1 F (36.7 C)   Resp 17   SpO2 97%  Physical Exam Vitals and nursing note reviewed.  Constitutional:      General: He is not  in acute distress.    Appearance: He is well-developed. He is not toxic-appearing.  HENT:     Head: Normocephalic and atraumatic.     Nose: Congestion and rhinorrhea present.  Eyes:     General: No scleral icterus.    Conjunctiva/sclera: Conjunctivae normal.  Cardiovascular:     Rate and Rhythm: Normal rate and regular rhythm.     Pulses: Normal pulses.     Heart sounds: Normal heart sounds. No murmur heard. Pulmonary:     Effort: Pulmonary effort is normal. No respiratory distress.     Breath sounds: Normal breath sounds.  Abdominal:     General: Abdomen is flat. Bowel sounds are normal.     Palpations: Abdomen is soft.     Tenderness: There is no abdominal tenderness.  Musculoskeletal:        General: No swelling.      Cervical back: Neck supple.     Right lower leg: No edema.     Left lower leg: No edema.  Skin:    General: Skin is warm and dry.     Capillary Refill: Capillary refill takes less than 2 seconds.     Findings: No lesion.  Neurological:     General: No focal deficit present.     Mental Status: He is alert and oriented to person, place, and time. Mental status is at baseline.  Psychiatric:        Mood and Affect: Mood normal.     ED Results / Procedures / Treatments   Labs (all labs ordered are listed, but only abnormal results are displayed) Labs Reviewed  RESP PANEL BY RT-PCR (RSV, FLU A&B, COVID)  RVPGX2 - Abnormal; Notable for the following components:      Result Value   Resp Syncytial Virus by PCR POSITIVE (*)    All other components within normal limits    EKG None  Radiology No results found.  Procedures Procedures    Medications Ordered in ED Medications - No data to display  ED Course/ Medical Decision Making/ A&P                                 Medical Decision Making Risk Prescription drug management.   [name] 58 y.o. presented today for URI like symptoms. Working DDx that I considered at this time includes, but not limited to, viral illness, pharyngitis, mono, sinusitis, electrolyte abnormality, AOM.  R/o DDx: these additional diagnoses are not consistent with patient's history, presentation, physical exam, labs/imaging findings.  Review of prior external notes: None  Labs:  Respiratory Panel: RSV   Problem List / ED Course / Critical interventions / Medication management  Presenting with URI-like symptoms.  He has been symptomatic for 4 days.  He has sick contact.  On initial evaluation he is well-appearing.  Vital signs without fever or hypoxia.  He did originally have cough but reports cough has improved.  Report when he did have cough it was dry.  He has no associated chest pain or shortness of breath.  Lungs are clear to auscultation  on exam.  Given improvement of cough, do not feel chest x-ray is needed at this time. Patient tested positive for RSV. Patient has congestion, without point tenderness over sinuses, likely viral given positive RSV swab. Patient does report that he normally gets Z-Pak when he has the symptoms however given that he has viral infection his symptoms have  been lasting for days and he has no significant point tenderness do not feel he needs antibiotic treatment at this time.  Discussed over-the-counter management patient agrees and will return if symptoms continue for over 10 days and if they are associated with fever or worsening symptoms.  Patients vitals assessed. Upon arrival patient is hemodynamically stable.  I have reviewed the patients home medicines and have made adjustments as needed     Plan:  F/u w/ PCP in 2-3d to ensure resolution of sx.  Patient was given return precautions. Patient stable for discharge at this time.  Patient educated on sx and dx and verbalized understanding of plan. Return to ER if new or worsening sx.          Final Clinical Impression(s) / ED Diagnoses Final diagnoses:  RSV (acute bronchiolitis due to respiratory syncytial virus)    Rx / DC Orders ED Discharge Orders          Ordered    benzonatate (TESSALON) 100 MG capsule  Every 8 hours        02/25/24 1206              Vickie Melnik, Horald Chestnut, PA-C 02/25/24 1459    Arby Barrette, MD 02/29/24 [phone]

## 2024-02-25 NOTE — ED Notes (Signed)
 Pt still on trackboard. Additional pain assessment added to take out of the system

## 2024-02-25 NOTE — ED Notes (Signed)
 Discharge instructions, follow up care, and prescription reviewed and explained, pt verbalized understanding and had no further questions on d/c. Pt caox4, ambulatory, NAD on d/c.

## 2024-02-25 NOTE — ED Triage Notes (Signed)
 Pt c/o URI symptoms onset Sunday, worsening over last 2 days. No OTC meds, denies known sick contact

## 2024-02-25 NOTE — Discharge Instructions (Signed)
 You are seen in emergency room today for upper respiratory tract infection.  You have RSV.  I sent Tessalon Perles to your pharmacy to take as needed for cough.  Make sure you are staying well-hydrated.  Return to emergency room if you start developing shortness of breath, chest pain or new or worsening symptoms.

## 2024-04-04 ENCOUNTER — Telehealth: Payer: Self-pay | Admitting: *Deleted

## 2024-04-04 DIAGNOSIS — E0590 Thyrotoxicosis, unspecified without thyrotoxic crisis or storm: Secondary | ICD-10-CM

## 2024-04-04 NOTE — Telephone Encounter (Signed)
 Pt needs labs updated

## 2024-04-04 NOTE — Telephone Encounter (Signed)
 Patient states that he has appointment her tomorrow with Augustin Schooling will not be able to keep that appointment. He needs to cancel. He would like a call back to make another appointment. Patient was not able to get lab work done.

## 2024-04-05 ENCOUNTER — Ambulatory Visit: Admitting: Nurse Practitioner

## 2024-04-06 NOTE — Telephone Encounter (Signed)
Labs have been updated . 

## 2024-04-07 ENCOUNTER — Ambulatory Visit: Payer: BC Managed Care – PPO | Admitting: Nurse Practitioner

## 2024-04-19 ENCOUNTER — Telehealth: Payer: Self-pay

## 2024-04-19 ENCOUNTER — Other Ambulatory Visit: Payer: Self-pay | Admitting: Nurse Practitioner

## 2024-04-19 DIAGNOSIS — I4891 Unspecified atrial fibrillation: Secondary | ICD-10-CM

## 2024-04-19 DIAGNOSIS — I2782 Chronic pulmonary embolism: Secondary | ICD-10-CM

## 2024-04-19 DIAGNOSIS — E1169 Type 2 diabetes mellitus with other specified complication: Secondary | ICD-10-CM

## 2024-04-19 DIAGNOSIS — I10 Essential (primary) hypertension: Secondary | ICD-10-CM

## 2024-04-19 DIAGNOSIS — E785 Hyperlipidemia, unspecified: Secondary | ICD-10-CM

## 2024-04-19 NOTE — Telephone Encounter (Signed)
 Copied from CRM [id number]. Topic: Clinical - Medication Refill >> Apr 19, 2024 10:32 AM Dimple Francis wrote: Most Recent Primary Care Visit:  Provider: Dorothe Gaster  Department: [facility]  Visit Type: TRANSFER OF CARE  Date: 07/22/2023  Medication: rivaroxaban  (XARELTO ) 20 MG TABS tablet ; furosemide  (LASIX ) 40 MG tablet  Has the patient contacted their pharmacy? Yes (Agent: If no, request that the patient contact the pharmacy for the refill. If patient does not wish to contact the pharmacy document the reason why and proceed with request.) (Agent: If yes, when and what did the pharmacy advise?)  Is this the correct pharmacy for this prescription? Yes If no, delete pharmacy and type the correct one.  This is the patient's preferred pharmacy:  [facility] [address], [place] - [address] [place] [address] Phone: [phone] Fax: [phone]    Has the prescription been filled recently? Yes  Is the patient out of the medication? Yes  Has the patient been seen for an appointment in the last year OR does the patient have an upcoming appointment? Yes  Can we respond through MyChart? Yes  Agent: Please be advised that Rx refills may take up to 3 business days. We ask that you follow-up with your pharmacy.

## 2024-04-20 MED ORDER — FUROSEMIDE 40 MG PO TABS: 40.0000 mg | ORAL_TABLET | Freq: Every day | ORAL | 0 refills | Status: DC

## 2024-04-20 MED ORDER — SITAGLIPTIN PHOSPHATE 100 MG PO TABS: 100.0000 mg | ORAL_TABLET | Freq: Every day | ORAL | 0 refills | Status: DC

## 2024-04-20 MED ORDER — AMLODIPINE BESYLATE 5 MG PO TABS: 5.0000 mg | ORAL_TABLET | Freq: Every day | ORAL | 0 refills | Status: DC

## 2024-04-20 MED ORDER — ATORVASTATIN CALCIUM 40 MG PO TABS: 40.0000 mg | ORAL_TABLET | Freq: Every day | ORAL | 0 refills | Status: DC

## 2024-04-20 MED ORDER — RIVAROXABAN 20 MG PO TABS: 20.0000 mg | ORAL_TABLET | Freq: Every morning | ORAL | 0 refills | Status: DC

## 2024-04-20 MED ORDER — LOSARTAN POTASSIUM 100 MG PO TABS: 100.0000 mg | ORAL_TABLET | Freq: Every day | ORAL | 0 refills | Status: DC

## 2024-04-20 MED ORDER — METOPROLOL SUCCINATE ER 50 MG PO TB24
50.0000 mg | ORAL_TABLET | Freq: Every day | ORAL | 0 refills | Status: DC
Start: 2024-04-20 — End: 2024-09-16

## 2024-04-20 NOTE — Telephone Encounter (Signed)
 Can we call and get information as to which medication he is after?

## 2024-04-20 NOTE — Telephone Encounter (Signed)
Left detailed voicemail for patient to call the office back if any questions or concerns.

## 2024-04-20 NOTE — Telephone Encounter (Signed)
 Contacted pt for clarification of prescription Refill  Xarelto  and furosemide  are requested. Pt states he's been out of both medication for about a week.  Scheduled pt with a follow up appointment for DM recheck on 05/20/24.  Pt has not been seen since Jan 2024. Pt complains of need for all medications to be refilled.  Will call pt back to let him know if all medications can be filled before appointment or not.

## 2024-04-20 NOTE — Telephone Encounter (Signed)
 I have refilled Januvia , metoprolol , losartan , and atorvastatin , amlodipine .  This is on top of his Xarelto  and furosemide .  He has to keep follow-up appointments in order to get refills

## 2024-04-21 ENCOUNTER — Other Ambulatory Visit: Payer: Self-pay | Admitting: Nurse Practitioner

## 2024-04-21 DIAGNOSIS — I10 Essential (primary) hypertension: Secondary | ICD-10-CM

## 2024-05-20 ENCOUNTER — Ambulatory Visit: Admitting: Nurse Practitioner

## 2024-06-09 ENCOUNTER — Ambulatory Visit: Admitting: Nurse Practitioner

## 2024-06-09 NOTE — Progress Notes (Deleted)
   Established Patient Office Visit  Subjective   Patient ID: Spencer English, male    DOB: 1966/11/02  Age: 58 y.o. MRN: [id number]  No chief complaint on file.   HPI  for complete physical and follow up of chronic conditions.  Gout: currently maintained on allopurinol  300mg  daily   HTN: on amlodipine  5, losartan  100, metoprolol  50, verapamil  240 and followed by cardiology   DM2: currently on januvia  100, and farxiga  5mg . He is also on ARB and statin    Hyperthyroidism: on methimazole  and followed by endocrine  Afib: on xarelto , verapamil , and metoprolol . Followed by cardiology and EP  Immunizations: -Tetanus: Completed in 2009 -Influenza: out of season  -Shingles: ? -Pneumonia: ?   Diet: Fair diet.  Exercise: No regular exercise.  Eye exam: Completes annually  Dental exam: Completes semi-annually    Colonoscopy: Completed in 08/21/2020, recall in 10 years Lung Cancer Screening: Completed in   PSA: Due  Sleep:     {History (Optional):23778}  ROS    Objective:     There were no vitals taken for this visit. {Vitals History (Optional):23777}  Physical Exam   No results found for any visits on 06/09/24.  {Labs (Optional):23779}  The 10-year ASCVD risk score (Arnett DK, et al., 2019) is: 44.2%    Assessment & Plan:   Problem List Items Addressed This Visit   None   No follow-ups on file.    Margarie Shay, NP

## 2024-06-12 ENCOUNTER — Other Ambulatory Visit: Payer: Self-pay

## 2024-06-12 ENCOUNTER — Encounter (HOSPITAL_BASED_OUTPATIENT_CLINIC_OR_DEPARTMENT_OTHER): Payer: Self-pay | Admitting: Emergency Medicine

## 2024-06-12 ENCOUNTER — Emergency Department (HOSPITAL_BASED_OUTPATIENT_CLINIC_OR_DEPARTMENT_OTHER)

## 2024-06-12 ENCOUNTER — Emergency Department (HOSPITAL_BASED_OUTPATIENT_CLINIC_OR_DEPARTMENT_OTHER)
Admission: EM | Admit: 2024-06-12 | Discharge: 2024-06-12 | Disposition: A | Discharge status: Home / Self Care | Attending: Emergency Medicine | Admitting: Emergency Medicine

## 2024-06-12 DIAGNOSIS — Z79899 Other long term (current) drug therapy: Secondary | ICD-10-CM | POA: Diagnosis not present

## 2024-06-12 DIAGNOSIS — K5790 Diverticulosis of intestine, part unspecified, without perforation or abscess without bleeding: Secondary | ICD-10-CM | POA: Diagnosis not present

## 2024-06-12 DIAGNOSIS — N200 Calculus of kidney: Secondary | ICD-10-CM | POA: Diagnosis not present

## 2024-06-12 DIAGNOSIS — I10 Essential (primary) hypertension: Secondary | ICD-10-CM | POA: Insufficient documentation

## 2024-06-12 DIAGNOSIS — K429 Umbilical hernia without obstruction or gangrene: Secondary | ICD-10-CM | POA: Diagnosis not present

## 2024-06-12 DIAGNOSIS — M5416 Radiculopathy, lumbar region: Secondary | ICD-10-CM | POA: Diagnosis not present

## 2024-06-12 DIAGNOSIS — Z7901 Long term (current) use of anticoagulants: Secondary | ICD-10-CM | POA: Insufficient documentation

## 2024-06-12 DIAGNOSIS — E119 Type 2 diabetes mellitus without complications: Secondary | ICD-10-CM | POA: Insufficient documentation

## 2024-06-12 DIAGNOSIS — I723 Aneurysm of iliac artery: Secondary | ICD-10-CM | POA: Diagnosis not present

## 2024-06-12 DIAGNOSIS — M549 Dorsalgia, unspecified: Secondary | ICD-10-CM | POA: Diagnosis not present

## 2024-06-12 LAB — CBC WITH DIFFERENTIAL/PLATELET
Abs Immature Granulocytes: 0.02 10*3/uL (ref 0.00–0.07)
Basophils Absolute: 0.1 10*3/uL (ref 0.0–0.1)
Basophils Relative: 1 %
Eosinophils Absolute: 0.3 10*3/uL (ref 0.0–0.5)
Eosinophils Relative: 6 %
HCT: 46.6 % (ref 39.0–52.0)
Hemoglobin: 15.3 g/dL (ref 13.0–17.0)
Immature Granulocytes: 0 %
Lymphocytes Relative: 18 %
Lymphs Abs: 1 10*3/uL (ref 0.7–4.0)
MCH: 27.8 pg (ref 26.0–34.0)
MCHC: 32.8 g/dL (ref 30.0–36.0)
MCV: 84.7 fL (ref 80.0–100.0)
Monocytes Absolute: 0.5 10*3/uL (ref 0.1–1.0)
Monocytes Relative: 10 %
Neutro Abs: 3.5 10*3/uL (ref 1.7–7.7)
Neutrophils Relative %: 65 %
Platelets: 213 10*3/uL (ref 150–400)
RBC: 5.5 MIL/uL (ref 4.22–5.81)
RDW: 15.9 % — ABNORMAL HIGH (ref 11.5–15.5)
WBC: 5.4 10*3/uL (ref 4.0–10.5)
nRBC: 0 % (ref 0.0–0.2)

## 2024-06-12 LAB — URINALYSIS, ROUTINE W REFLEX MICROSCOPIC
Bilirubin Urine: NEGATIVE
Glucose, UA: NEGATIVE mg/dL
Hgb urine dipstick: NEGATIVE
Ketones, ur: NEGATIVE mg/dL
Leukocytes,Ua: NEGATIVE
Nitrite: NEGATIVE
Specific Gravity, Urine: 1.019 (ref 1.005–1.030)
pH: 6 (ref 5.0–8.0)

## 2024-06-12 LAB — BASIC METABOLIC PANEL WITH GFR
Anion gap: 12 (ref 5–15)
BUN: 10 mg/dL (ref 6–20)
CO2: 23 mmol/L (ref 22–32)
Calcium: 8.9 mg/dL (ref 8.9–10.3)
Chloride: 102 mmol/L (ref 98–111)
Creatinine, Ser: 1.17 mg/dL (ref 0.61–1.24)
GFR, Estimated: >60 mL/min (ref >60)
Glucose, Bld: 121 mg/dL — ABNORMAL HIGH (ref 70–99)
Potassium: 4.2 mmol/L (ref 3.5–5.1)
Sodium: 137 mmol/L (ref 135–145)

## 2024-06-12 MED ORDER — HYDROCODONE-ACETAMINOPHEN 5-325 MG PO TABS: 1.0000 | ORAL_TABLET | Freq: Four times a day (QID) | ORAL | 0 refills | Status: DC | PRN

## 2024-06-12 MED ORDER — CYCLOBENZAPRINE HCL 10 MG PO TABS: 10.0000 mg | ORAL_TABLET | Freq: Two times a day (BID) | ORAL | 0 refills | Status: AC | PRN

## 2024-06-12 MED ORDER — LIDOCAINE 5 % EX PTCH
1.0000 | MEDICATED_PATCH | CUTANEOUS | Status: DC
  Administered 2024-06-12: 1 via TRANSDERMAL
  Filled 2024-06-12: qty 1

## 2024-06-12 MED ORDER — KETOROLAC TROMETHAMINE 60 MG/2ML IM SOLN
30.0000 mg | Freq: Once | INTRAMUSCULAR | Status: AC
  Administered 2024-06-12: 30 mg via INTRAMUSCULAR
  Filled 2024-06-12: qty 2

## 2024-06-12 MED ORDER — FENTANYL CITRATE PF 50 MCG/ML IJ SOSY
50.0000 ug | PREFILLED_SYRINGE | Freq: Once | INTRAMUSCULAR | Status: AC
  Administered 2024-06-12: 50 ug via INTRAVENOUS
  Filled 2024-06-12: qty 1

## 2024-06-12 MED ORDER — CYCLOBENZAPRINE HCL 5 MG PO TABS
5.0000 mg | ORAL_TABLET | Freq: Once | ORAL | Status: AC
  Administered 2024-06-12: 5 mg via ORAL
  Filled 2024-06-12: qty 1

## 2024-06-12 MED ORDER — HYDROCODONE-ACETAMINOPHEN 5-325 MG PO TABS
1.0000 | ORAL_TABLET | Freq: Once | ORAL | Status: AC
  Administered 2024-06-12: 1 via ORAL
  Filled 2024-06-12: qty 1

## 2024-06-12 MED ORDER — METHYLPREDNISOLONE 4 MG PO TBPK: ORAL_TABLET | ORAL | 0 refills | Status: AC

## 2024-06-12 NOTE — ED Notes (Signed)
 L green re-draw in lab.

## 2024-06-12 NOTE — ED Notes (Signed)
 Dc instructions reviewed with patient. Patient voiced understanding. Dc with belongings.

## 2024-06-12 NOTE — ED Triage Notes (Signed)
 Pt c/o muscle spasms in L side of back x1 mo but states last night pain became more severe.

## 2024-06-12 NOTE — Discharge Instructions (Addendum)
 [facility] has closed permanently so the information for a new specialist was placed on your paperwork.  You will do a short course of steroids but it will cause you to have higher sugars than what you are used to.  Will also do a muscle relaxer and pain medication as needed.  You can also try a heating pad and lidocaine  patches.  Gentle stretching can also be helpful.
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
## 2024-06-12 NOTE — ED Provider Notes (Signed)
 [place] EMERGENCY DEPARTMENT AT [facility] Provider Note   CSN: [id number] Arrival date & time: 06/12/24  1250     Patient presents with: Back Pain   Spencer English is a 58 y.o. male.    Back Pain    58 year old male with medical history significant for HTN, HLD, PE and DVT on Xarelto , DM2, gout, chronic lumbar back pain in the setting of lumbar stenosis, atrial fibrillation, GERD, OSA, urinary calculus who presents to the emergency department with acute on chronic back pain.  The patient states that for the last 5 weeks he has had low back pain in his left lower spine with associated occasional muscle spasms.  He also states he has a history of kidney stones.  Last night, he had sudden severe worsening of his pain in his left flank.  He endorses sharp pain in his left flank that has persisted.  He denies any falls or trauma.  He denies any urinary or fecal incontinence, denies any saddle anesthesia.  Denies any fevers.  He feels weak in the left leg primarily due to pain in the left lower back.  Pain is somewhat worse with twisting and movement.  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  He cannot think of any inciting factors that could have worsened his back pain.  Pain is sharp and severe and is located in the left flank.  He denies any nausea or vomiting, denies any blood in his urine or burning while urinating.  Prior to Admission medications   Medication Sig Start Date End Date Taking? Authorizing Provider  allopurinol  (ZYLOPRIM ) 300 MG tablet Take by mouth. 12/07/18   [provider]  amLODipine  (NORVASC ) 5 MG tablet Take 1 tablet (5 mg total) by mouth daily. 04/20/24   Dorothe Gaster, NP  atorvastatin  (LIPITOR) 40 MG tablet Take 1 tablet (40 mg total) by mouth daily. 04/20/24   Dorothe Gaster, NP  benzonatate  (TESSALON ) 100 MG capsule Take 1 capsule (100 mg total) by mouth every 8 (eight) hours. 02/25/24   Barrett, Kandace Organ, PA-C   dapagliflozin  propanediol (FARXIGA ) 5 MG TABS tablet Take 1 tablet (5 mg total) by mouth daily. 07/22/23   Dorothe Gaster, NP  diclofenac  (VOLTAREN ) 75 MG EC tablet Take 1 tablet (75 mg total) by mouth 2 (two) times daily. 02/04/24   Brandt Cake, DPM  furosemide  (LASIX ) 40 MG tablet Take 1 tablet (40 mg total) by mouth daily. NEEDS OFFICE VISIT FOR FURTHER REFILLS 04/20/24   Dorothe Gaster, NP  glucose blood (CONTOUR NEXT TEST) test strip 1 each by Other route daily. And lancets 1/day 09/02/19   Gwyndolyn Lerner, MD  losartan  (COZAAR ) 100 MG tablet Take 1 tablet (100 mg total) by mouth daily. 04/20/24   Dorothe Gaster, NP  Meclizine HCl 25 MG CHEW Chew by mouth. 05/26/17   [provider]  methimazole  (TAPAZOLE ) 5 MG tablet Take 1 tablet (5 mg total) by mouth daily. 10/14/23   Wendel Hals, NP  metoprolol  succinate (TOPROL -XL) 50 MG 24 hr tablet Take 1 tablet (50 mg total) by mouth daily. 04/20/24   Dorothe Gaster, NP  polyvinyl alcohol (LIQUIFILM TEARS) 1.4 % ophthalmic solution Place 1 drop into both eyes as needed for dry eyes.    [provider]  rivaroxaban  (XARELTO ) 20 MG TABS tablet Take 1 tablet (20 mg total) by mouth every morning. NEED OFFICE VISIT FOR FURTHER REFILLS 04/20/24  Dorothe Gaster, NP  sitaGLIPtin  (JANUVIA ) 100 MG tablet Take 1 tablet (100 mg total) by mouth daily. 04/20/24   Dorothe Gaster, NP  verapamil  (VERELAN ) 240 MG 24 hr capsule Take by mouth. 05/26/17   [provider]    Allergies: Metformin and Viagra [sildenafil citrate]    Review of Systems  Genitourinary:  Positive for flank pain.  Musculoskeletal:  Positive for back pain.  All other systems reviewed and are negative.   Updated Vital Signs BP (!) 151/102 (BP Location: Right Arm)   Pulse 76   Temp 98 F (36.7 C)   Resp 18   Ht 6' (1.829 m)   Wt 117.9 kg   SpO2 96%   BMI 35.26 kg/m   Physical Exam Vitals and nursing note reviewed.  Constitutional:      General: He is not  in acute distress.    Appearance: He is well-developed.  HENT:     Head: Normocephalic and atraumatic.   Eyes:     Conjunctiva/sclera: Conjunctivae normal.    Cardiovascular:     Rate and Rhythm: Normal rate and regular rhythm.     Heart sounds: No murmur heard. Pulmonary:     Effort: Pulmonary effort is normal. No respiratory distress.     Breath sounds: Normal breath sounds.  Abdominal:     Palpations: Abdomen is soft.     Tenderness: There is no abdominal tenderness.   Musculoskeletal:        General: No swelling.     Cervical back: Neck supple.     Comments: Negative straight leg raise test bilaterally.  Tenderness to palpation of the left lower lumbar paraspinal muscles   Skin:    General: Skin is warm and dry.     Capillary Refill: Capillary refill takes less than 2 seconds.   Neurological:     Mental Status: He is alert.     Comments: 5/5 strength in the bilateral upper extremities with intact sensation to light touch.  4-5 strength in the left lower extremity appears to be pain related and effort dependent, intact sensation to light touch bilaterally, 5 out of 5 strength in the right lower extremity  Psychiatric:        Mood and Affect: Mood normal.     (all labs ordered are listed, but only abnormal results are displayed) Labs Reviewed - No data to display  EKG: None  Radiology: No results found.   Procedures   Medications Ordered in the ED - No data to display                                  Medical Decision Making Amount and/or Complexity of Data Reviewed Labs: ordered. Radiology: ordered.  Risk Prescription drug management.     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  The patient states that for the last  5 weeks he has had low back pain in his left lower spine with associated occasional muscle spasms.  He also states he has a history of kidney stones.  Last night, he had sudden severe worsening of his pain in his left flank.  He endorses sharp pain in his left flank that has persisted.  He  denies any falls or trauma.  He denies any urinary or fecal incontinence, denies any saddle anesthesia.  Denies any fevers.  He feels weak in the left leg primarily due to pain in the left lower back.  Pain is somewhat worse with twisting and movement.  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  He cannot think of any inciting factors that could have worsened his back pain.  Pain is sharp and severe and is located in the left flank.  He denies any nausea or vomiting, denies any blood in his urine or burning while urinating.  On arrival, the patient was vitally stable.  Presenting with left flank pain.  Has a history of ureteric calculus however also has a history of chronic low back pain.  Had been having chronic pain over the last 5 weeks with associated muscle spasm which severely worsened last night without clear inciting factor.  Considered nephrolithiasis as etiology and workup was initiated to include urinalysis, CBC, BMP and CT imaging.  Patient was administered Toradol , Flexeril , Norco, lidocaine  for initial pain management and still had persistent significant pain and therefore IV fentanyl  was administered.  Signout given to Dr. Leida Puna pending results of CT imaging and laboratory evaluation, ultimate disposition pending results of diagnostic testing and reassessment.  Signout given at 1500.     Final diagnoses:  None    ED Discharge Orders     None          Rosealee Concha, MD 06/12/24 1544

## 2024-07-07 ENCOUNTER — Ambulatory Visit: Admitting: Nurse Practitioner

## 2024-07-07 DIAGNOSIS — M25552 Pain in left hip: Secondary | ICD-10-CM | POA: Diagnosis not present

## 2024-07-07 NOTE — Progress Notes (Deleted)
   Established Patient Office Visit  Subjective   Patient ID: Spencer English, male    DOB: 1966/12/10  Age: 58 y.o. MRN: [id number]  No chief complaint on file.   HPI  HTN: Patient currently maintained on amlodipine  5 mg daily, losartan  100 mg daily furosemide  40 mg daily, metoprolol  50 mg daily, verapamil  240 mg daily  A-fib: Patient currently maintained on verapamil  240 mg daily, rivaroxaban  20 mg daily, metoprolol  50 mg daily.  Patient was followed by Dr. Kelsie and Dr. Waddell cardiology  Hyperthyroidism: Patient currently maintained on methimazole  5 mg daily  DM2: Patient currently maintained on Januvia  100 mg daily, Farxiga  5 mg daily.  HLD: Patient currently maintained on atorvastatin  40 mg daily  Gout: Patient currently maintained on allopurinol  300 mg daily  OSA:   for complete physical and follow up of chronic conditions.  Immunizations: -Tetanus: Completed in 2009 needs updating -Influenza: Out of season -Shingles: Needs updating -Pneumonia: Needs updating  Diet: Fair diet.  Exercise: No regular exercise.  Eye exam: Completes annually  Dental exam: Completes semi-annually    Colonoscopy: Completed in  08/21/2020, recall 10 years.  Patient due 2031 Lung Cancer Screening: Completed in   PSA: Due  Sleep:      {History (Optional):23778}  ROS    Objective:     There were no vitals taken for this visit. {Vitals History (Optional):23777}  Physical Exam   No results found for any visits on 07/07/24.  {Labs (Optional):23779}  The 10-year ASCVD risk score (Arnett DK, et al., 2019) is: 41%    Assessment & Plan:   Problem List Items Addressed This Visit   None   No follow-ups on file.    Adina Crandall, NP

## 2024-07-11 ENCOUNTER — Other Ambulatory Visit: Payer: Self-pay | Admitting: Nurse Practitioner

## 2024-07-11 NOTE — Telephone Encounter (Signed)
 Copied from CRM [id number]. Topic: Clinical - Medication Refill >> Jul 11, 2024 12:12 PM Sharnea C wrote: Medication:   methimazole  (TAPAZOLE ) 5 MG tablet  Has the patient contacted their pharmacy? No (Agent: If no, request that the patient contact the pharmacy for the refill. If patient does not wish to contact the pharmacy document the reason why and proceed with request.) (Agent: If yes, when and what did the pharmacy advise?)  This is the patient's preferred pharmacy:  [facility] [address] [place] - [address] [place] [address] Phone: [phone] Fax: [phone]   Is this the correct pharmacy for this prescription? Yes If no, delete pharmacy and type the correct one.   Has the prescription been filled recently? No  Is the patient out of the medication? Yes  Has the patient been seen for an appointment in the last year OR does the patient have an upcoming appointment? Yes  Can we respond through MyChart? No  Agent: Please be advised that Rx refills may take up to 3 business days. We ask that you follow-up with your pharmacy.

## 2024-07-19 ENCOUNTER — Other Ambulatory Visit: Payer: Self-pay | Admitting: Nurse Practitioner

## 2024-07-19 DIAGNOSIS — I10 Essential (primary) hypertension: Secondary | ICD-10-CM

## 2024-07-20 NOTE — Telephone Encounter (Signed)
 LOV: 07/22/23 NOV: 08/01/24 Last Refill: methimazole  (TAPAZOLE ) 5 MG tablet 10/14/23 90 tablets 1 refill

## 2024-07-27 ENCOUNTER — Emergency Department (HOSPITAL_BASED_OUTPATIENT_CLINIC_OR_DEPARTMENT_OTHER)

## 2024-07-27 ENCOUNTER — Emergency Department (HOSPITAL_BASED_OUTPATIENT_CLINIC_OR_DEPARTMENT_OTHER): Admitting: Radiology

## 2024-07-27 ENCOUNTER — Encounter (HOSPITAL_BASED_OUTPATIENT_CLINIC_OR_DEPARTMENT_OTHER): Payer: Self-pay

## 2024-07-27 ENCOUNTER — Emergency Department (HOSPITAL_BASED_OUTPATIENT_CLINIC_OR_DEPARTMENT_OTHER)
Admission: EM | Admit: 2024-07-27 | Discharge: 2024-07-27 | Disposition: A | Discharge status: Home / Self Care | Attending: Emergency Medicine | Admitting: Emergency Medicine

## 2024-07-27 ENCOUNTER — Other Ambulatory Visit: Payer: Self-pay

## 2024-07-27 DIAGNOSIS — M25552 Pain in left hip: Secondary | ICD-10-CM | POA: Diagnosis not present

## 2024-07-27 DIAGNOSIS — Z7984 Long term (current) use of oral hypoglycemic drugs: Secondary | ICD-10-CM | POA: Insufficient documentation

## 2024-07-27 DIAGNOSIS — Z79899 Other long term (current) drug therapy: Secondary | ICD-10-CM | POA: Insufficient documentation

## 2024-07-27 DIAGNOSIS — S7002XA Contusion of left hip, initial encounter: Secondary | ICD-10-CM | POA: Diagnosis not present

## 2024-07-27 DIAGNOSIS — W19XXXA Unspecified fall, initial encounter: Secondary | ICD-10-CM

## 2024-07-27 DIAGNOSIS — Y92480 Sidewalk as the place of occurrence of the external cause: Secondary | ICD-10-CM | POA: Insufficient documentation

## 2024-07-27 DIAGNOSIS — I10 Essential (primary) hypertension: Secondary | ICD-10-CM | POA: Diagnosis not present

## 2024-07-27 DIAGNOSIS — W101XXA Fall (on)(from) sidewalk curb, initial encounter: Secondary | ICD-10-CM | POA: Diagnosis not present

## 2024-07-27 DIAGNOSIS — E119 Type 2 diabetes mellitus without complications: Secondary | ICD-10-CM | POA: Insufficient documentation

## 2024-07-27 DIAGNOSIS — M545 Low back pain, unspecified: Secondary | ICD-10-CM | POA: Diagnosis not present

## 2024-07-27 DIAGNOSIS — I700 Atherosclerosis of aorta: Secondary | ICD-10-CM | POA: Diagnosis not present

## 2024-07-27 DIAGNOSIS — M160 Bilateral primary osteoarthritis of hip: Secondary | ICD-10-CM | POA: Diagnosis not present

## 2024-07-27 DIAGNOSIS — Z043 Encounter for examination and observation following other accident: Secondary | ICD-10-CM | POA: Diagnosis not present

## 2024-07-27 DIAGNOSIS — M5135 Other intervertebral disc degeneration, thoracolumbar region: Secondary | ICD-10-CM | POA: Diagnosis not present

## 2024-07-27 DIAGNOSIS — M7989 Other specified soft tissue disorders: Secondary | ICD-10-CM | POA: Diagnosis not present

## 2024-07-27 DIAGNOSIS — M1612 Unilateral primary osteoarthritis, left hip: Secondary | ICD-10-CM | POA: Diagnosis not present

## 2024-07-27 MED ORDER — OXYCODONE HCL 5 MG PO TABS: 5.0000 mg | ORAL_TABLET | Freq: Four times a day (QID) | ORAL | 0 refills | Status: AC | PRN

## 2024-07-27 NOTE — Discharge Instructions (Signed)
 Please read and follow all provided instructions.  Your diagnoses today include:  1. Contusion of left hip, initial encounter   2. Fall, initial encounter     Tests performed today include: An x-ray of the lower back shows no fractures, some arthritis X-ray of the hip: Showed question of a nondisplaced fracture CT shows a contusion, no broken bones Vital signs. See below for your results today.   Medications prescribed: Oxycodone  - narcotic pain medication  DO NOT drive or perform any activities that require you to be awake and alert because this medicine can make you drowsy.   Take any prescribed medications only as directed.  Home care instructions:  Follow any educational materials contained in this packet Follow R.I.C.E. Protocol: R - rest your injury  I  - use ice on injury without applying directly to skin C - compress injury with bandage or splint E - elevate the injury as much as possible  Follow-up instructions: Please follow-up with your primary care provider or your orthopedic physician (bone specialist) if you continue to have significant pain in 1 week. In this case you may have a more severe injury that requires further care.   Return instructions:  Please return if your toes or feet are numb or tingling, appear gray or blue, or you have severe pain (also elevate the leg and loosen splint or wrap if you were given one) Please return to the Emergency Department if you experience worsening symptoms.  Please return if you have any other emergent concerns.  Additional Information:  Your vital signs today were: BP (!) 140/87   Pulse 61   Temp 98 F (36.7 C)   Resp (!) 27   SpO2 98%  If your blood pressure (BP) was elevated above 135/85 this visit, please have this repeated by your doctor within one month. --------------

## 2024-07-27 NOTE — ED Provider Notes (Signed)
 [place] EMERGENCY DEPARTMENT AT [facility] Provider Note   CSN: [id number] Arrival date & time: 07/27/24  1246     Patient presents with: Fall and Hip Pain   EDRIC English is a 58 y.o. male.   Patient presents to the emergency department today for evaluation of left hip pain.  Patient fell approximately 2 feet 2 days ago from the back of a [employer] truck, while helping a family member move.  Patient had pain in the left hip and lower back area.  Patient has a history of diabetes, hypertension anticholesterol.  Patient is on anticoagulation due to history of blood clot.  Patient is ambulatory, drove himself to the emergency department today.  Reports increasing pain with movement and with bearing weight, but is able to do so.  He reports having a bruise over his left lateral hip.       Prior to Admission medications   Medication Sig Start Date End Date Taking? Authorizing Provider  allopurinol  (ZYLOPRIM ) 300 MG tablet Take by mouth. 12/07/18   [provider]  amLODipine  (NORVASC ) 5 MG tablet Take 1 tablet (5 mg total) by mouth daily. 04/20/24   Wendee Lynwood HERO, NP  atorvastatin  (LIPITOR) 40 MG tablet Take 1 tablet (40 mg total) by mouth daily. 04/20/24   Wendee Lynwood HERO, NP  benzonatate  (TESSALON ) 100 MG capsule Take 1 capsule (100 mg total) by mouth every 8 (eight) hours. 02/25/24   Barrett, Jamie N, PA-C  cyclobenzaprine  (FLEXERIL ) 10 MG tablet Take 1 tablet (10 mg total) by mouth 2 (two) times daily as needed for muscle spasms. 06/12/24   Doretha Folks, MD  dapagliflozin  propanediol (FARXIGA ) 5 MG TABS tablet Take 1 tablet (5 mg total) by mouth daily. 07/22/23   Wendee Lynwood HERO, NP  diclofenac  (VOLTAREN ) 75 MG EC tablet Take 1 tablet (75 mg total) by mouth 2 (two) times daily. 02/04/24   Magdalen Pasco RAMAN, DPM  furosemide  (LASIX ) 40 MG tablet TAKE 1 TABLET(40 MG) BY MOUTH DAILY 07/19/24   Wendee Lynwood HERO, NP  glucose blood (CONTOUR NEXT TEST) test strip 1 each by Other  route daily. And lancets 1/day 09/02/19   Kassie Mallick, MD  HYDROcodone -acetaminophen  (NORCO/VICODIN) 5-325 MG tablet Take 1 tablet by mouth every 6 (six) hours as needed. 06/12/24   Doretha Folks, MD  losartan  (COZAAR ) 100 MG tablet Take 1 tablet (100 mg total) by mouth daily. 04/20/24   Wendee Lynwood HERO, NP  Meclizine HCl 25 MG CHEW Chew by mouth. 05/26/17   [provider]  methimazole  (TAPAZOLE ) 5 MG tablet Take 1 tablet (5 mg total) by mouth daily. 10/14/23   Therisa Folks PARAS, NP  methylPREDNISolone  (MEDROL  DOSEPAK) 4 MG TBPK tablet Take according to box directions 06/12/24   Doretha Folks, MD  metoprolol  succinate (TOPROL -XL) 50 MG 24 hr tablet Take 1 tablet (50 mg total) by mouth daily. 04/20/24   Wendee Lynwood HERO, NP  polyvinyl alcohol (LIQUIFILM TEARS) 1.4 % ophthalmic solution Place 1 drop into both eyes as needed for dry eyes.    [provider]  rivaroxaban  (XARELTO ) 20 MG TABS tablet Take 1 tablet (20 mg total) by mouth every morning. NEED OFFICE VISIT FOR FURTHER REFILLS 04/20/24   Wendee Lynwood HERO, NP  sitaGLIPtin  (JANUVIA ) 100 MG tablet Take 1 tablet (100 mg total) by mouth daily. 04/20/24   Wendee Lynwood HERO, NP  verapamil  (VERELAN ) 240 MG 24 hr capsule Take by mouth. 05/26/17   [provider]  Allergies: Metformin and Viagra [sildenafil citrate]    Review of Systems  Updated Vital Signs BP 138/89   Pulse 77   Temp 98 F (36.7 C)   Resp 18   SpO2 99%   Physical Exam Vitals and nursing note reviewed.  Constitutional:      Appearance: He is well-developed.  HENT:     Head: Normocephalic and atraumatic.  Eyes:     Conjunctiva/sclera: Conjunctivae normal.  Cardiovascular:     Pulses: Normal pulses. No decreased pulses.  Abdominal:     Comments: No abdominal tenderness  Musculoskeletal:        General: Tenderness present.     Cervical back: Normal range of motion and neck supple.     Thoracic back: No tenderness or bony tenderness.     Lumbar  back: Tenderness present. No bony tenderness.     Left hip: Tenderness present. No bony tenderness. Normal range of motion.     Right lower leg: No edema.     Left lower leg: No edema.       Legs:     Comments: Patient with contusion noted with bruising over the left lateral hip.  I am able to passively flex the hip without difficulty.  No knee pain.  Skin:    General: Skin is warm and dry.  Neurological:     Mental Status: He is alert.     Sensory: No sensory deficit.     Comments: Motor, sensation, and vascular distal to the injury is fully intact.   Psychiatric:        Mood and Affect: Mood normal.     (all labs ordered are listed, but only abnormal results are displayed) Labs Reviewed - No data to display  EKG: None  Radiology: No results found.   Procedures   Medications Ordered in the Spencer - No data to display  Spencer Course  Patient seen and examined. History obtained directly from patient.   Labs/EKG: None ordered  Imaging: Ordered x-ray of the left hip and lumbar spine.  Medications/Fluids: None ordered  Most recent vital signs reviewed and are as follows: BP 138/89   Pulse 77   Temp 98 F (36.7 C)   Resp 18   SpO2 99%   Initial impression: Left hip contusion, imaging ordered.  2:27 PM Reassessment performed. Patient appears stable.  Imaging personally visualized and interpreted including: Lumbar spine fracture, gree multilevel degenerative disc disease; x-ray of the hip and pelvis agree no displaced fracture, however there is a subtle lucency along the superior cortex of the left femoral neck.  Reviewed pertinent lab work and imaging with patient at bedside. Questions answered.   Most current vital signs reviewed and are as follows: BP 138/89   Pulse 77   Temp 98 F (36.7 C)   Resp 18   SpO2 99%   Plan: Abnormality on x-ray will need to be evaluated with CT imaging.  Patient updated and this was ordered.  4:06 PM Reassessment performed. Patient  appears stable.  Imaging personally visualized and interpreted including: CT of the head, gree negative for fracture.  Reviewed pertinent lab work and imaging with patient at bedside. Questions answered.   Most current vital signs reviewed and are as follows: BP (!) 140/87   Pulse 61   Temp 98 F (36.7 C)   Resp (!) 27   SpO2 98%   Plan: Discharge to home.   Prescriptions written for: Oxycodone  # 6 tablets  Other home care  instructions discussed: RICE protocol, Tylenol   Spencer return instructions discussed: New or worsening symptoms  Follow-up instructions discussed: Patient encouraged to follow-up with their PCP or his orthopedist in 7 days, if not improving.                                   Medical Decision Making Amount and/or Complexity of Data Reviewed Radiology: ordered.  Risk Prescription drug management.   Patient with fall onto left hip, on anticoagulation.  Left lower extremity is neurovascularly intact.  X-ray with abnormality, follow-up CT without signs of fracture.  Do not suspect significant blood loss.  No indication for emergent orthopedic consultation.  Will treat symptoms and have patient follow-up as needed.  He does work in [employer], driving a concrete truck, will give work note through the end of the week.     Final diagnoses:  Contusion of left hip, initial encounter  Fall, initial encounter    Spencer English          Ordered    oxyCODONE  (OXY IR/ROXICODONE ) 5 MG immediate release tablet  Every 6 hours PRN        07/27/24 1600               Desiderio Chew, PA-C 07/27/24 1608    Lenor Hollering, MD 07/28/24 [phone]

## 2024-07-27 NOTE — ED Triage Notes (Addendum)
 Patient fell on Monday from a [employer] truck onto pavement. He now reports left hip pain and lower back pain as well as a large contusion.   He does report he is on anticoagulation therapy for blood clots

## 2024-07-27 NOTE — ED Notes (Signed)
 Patient transported to CT

## 2024-08-01 ENCOUNTER — Ambulatory Visit: Admitting: Nurse Practitioner

## 2024-08-01 NOTE — Progress Notes (Deleted)
   Established Patient Office Visit  Subjective   Patient ID: Spencer English, male    DOB: 1966-05-10  Age: 58 y.o. MRN: [id number]  No chief complaint on file.   HPI  Gout: Patient currently maintained on allopurinol  300 mg daily  HTN: Patient currently maintained on amlodipine  5 mg daily, furosemide  40 mg daily, losartan  100 mg daily, metoprolol  50 mg daily, verapamil  240 mg daily?  HLD: Patient currently maintained on atorvastatin  40 mg daily  Hypothyroidism: Patient currently maintained on methimazole  5 mg daily is followed by endocrinology  A-fib: Patient is currently maintained on Xarelto  20 mg daily along with metoprolol  50 mg daily and verapamil  240 mg daily  DM2: Patient is currently maintained on sitagliptin  100 mg daily.  Last A1c 6.7%  for complete physical and follow up of chronic conditions.  Immunizations: -Tetanus: Completed in 2009 -Influenza: Out of season -Shingles:? -Pneumonia: Needs updating  Diet: Fair diet.  Exercise: No regular exercise.  Eye exam: Completes annually  Dental exam: Completes semi-annually    Colonoscopy: Completed in 08/21/2020, repeat 10 years.  Patient is due in 2031 Lung Cancer Screening: Completed in   PSA: Due  Sleep:     {History (Optional):23778}  ROS    Objective:     There were no vitals taken for this visit. {Vitals History (Optional):23777}  Physical Exam   No results found for any visits on 08/01/24.  {Labs (Optional):23779}  The 10-year ASCVD risk score (Arnett DK, et al., 2019) is: 39.3%    Assessment & Plan:   Problem List Items Addressed This Visit   None   No follow-ups on file.    Adina Crandall, NP

## 2024-08-02 ENCOUNTER — Emergency Department (HOSPITAL_BASED_OUTPATIENT_CLINIC_OR_DEPARTMENT_OTHER)
Admission: EM | Admit: 2024-08-02 | Discharge: 2024-08-02 | Disposition: A | Discharge status: Home / Self Care | Attending: Emergency Medicine | Admitting: Emergency Medicine

## 2024-08-02 ENCOUNTER — Other Ambulatory Visit: Payer: Self-pay

## 2024-08-02 DIAGNOSIS — Z7901 Long term (current) use of anticoagulants: Secondary | ICD-10-CM | POA: Insufficient documentation

## 2024-08-02 DIAGNOSIS — E039 Hypothyroidism, unspecified: Secondary | ICD-10-CM | POA: Diagnosis not present

## 2024-08-02 DIAGNOSIS — Z79899 Other long term (current) drug therapy: Secondary | ICD-10-CM | POA: Insufficient documentation

## 2024-08-02 DIAGNOSIS — M25552 Pain in left hip: Secondary | ICD-10-CM

## 2024-08-02 DIAGNOSIS — S7002XA Contusion of left hip, initial encounter: Secondary | ICD-10-CM | POA: Insufficient documentation

## 2024-08-02 DIAGNOSIS — E119 Type 2 diabetes mellitus without complications: Secondary | ICD-10-CM | POA: Insufficient documentation

## 2024-08-02 DIAGNOSIS — W19XXXA Unspecified fall, initial encounter: Secondary | ICD-10-CM | POA: Diagnosis not present

## 2024-08-02 DIAGNOSIS — S79912A Unspecified injury of left hip, initial encounter: Secondary | ICD-10-CM | POA: Diagnosis not present

## 2024-08-02 NOTE — ED Provider Notes (Signed)
 [place] EMERGENCY DEPARTMENT AT [facility] Provider Note   CSN: [id number] Arrival date & time: 08/02/24  1224     Patient presents with: Spencer English is a 58 y.o. male with a past medical history significant for diabetes, pulmonary hypertension, hyperlipidemia, history of PE, hypothyroidism who presents to the ED due to worsening ecchymosis to left hip region.  Patient fell 1 week ago and was evaluated in the ED for hip pain.  CT left hip and x-ray negative for any bony fractures.  Patient states bruising has gotten bigger over the past week.  Denies any further injury.  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  No fever or chills. No chest pain or shortness of breath.   History obtained from patient and past medical records. No interpreter used during encounter.      Prior to Admission medications   Medication Sig Start Date End Date Taking? Authorizing Provider  allopurinol  (ZYLOPRIM ) 300 MG tablet Take by mouth. 12/07/18   [provider]  amLODipine  (NORVASC ) 5 MG tablet Take 1 tablet (5 mg total) by mouth daily. 04/20/24   Wendee Lynwood HERO, NP  atorvastatin  (LIPITOR) 40 MG tablet Take 1 tablet (40 mg total) by mouth daily. 04/20/24   Wendee Lynwood HERO, NP  benzonatate  (TESSALON ) 100 MG capsule Take 1 capsule (100 mg total) by mouth every 8 (eight) hours. 02/25/24   Barrett, Jamie N, PA-C  cyclobenzaprine  (FLEXERIL ) 10 MG tablet Take 1 tablet (10 mg total) by mouth 2 (two) times daily as needed for muscle spasms. 06/12/24   Doretha Folks, MD  dapagliflozin  propanediol (FARXIGA ) 5 MG TABS tablet Take 1 tablet (5 mg total) by mouth daily. 07/22/23   Wendee Lynwood HERO, NP  diclofenac  (VOLTAREN ) 75 MG EC tablet Take 1 tablet (75 mg total) by mouth 2 (two) times daily. 02/04/24   Magdalen Pasco RAMAN, DPM  furosemide  (LASIX ) 40 MG tablet TAKE 1 TABLET(40 MG) BY MOUTH DAILY 07/19/24   Wendee Lynwood HERO, NP   glucose blood (CONTOUR NEXT TEST) test strip 1 each by Other route daily. And lancets 1/day 09/02/19   Kassie Mallick, MD  losartan  (COZAAR ) 100 MG tablet Take 1 tablet (100 mg total) by mouth daily. 04/20/24   Wendee Lynwood HERO, NP  Meclizine HCl 25 MG CHEW Chew by mouth. 05/26/17   [provider]  methimazole  (TAPAZOLE ) 5 MG tablet Take 1 tablet (5 mg total) by mouth daily. 10/14/23   Therisa Folks PARAS, NP  methylPREDNISolone  (MEDROL  DOSEPAK) 4 MG TBPK tablet Take according to box directions 06/12/24   Doretha Folks, MD  metoprolol  succinate (TOPROL -XL) 50 MG 24 hr tablet Take 1 tablet (50 mg total) by mouth daily. 04/20/24   Wendee Lynwood HERO, NP  oxyCODONE  (OXY IR/ROXICODONE ) 5 MG immediate release tablet Take 1 tablet (5 mg total) by mouth every 6 (six) hours as needed. 07/27/24   Geiple, Joshua, PA-C  polyvinyl alcohol (LIQUIFILM TEARS) 1.4 % ophthalmic solution Place 1 drop into both eyes as needed for dry eyes.    [provider]  rivaroxaban  (XARELTO ) 20 MG TABS tablet Take 1 tablet (20 mg total) by mouth every morning. NEED OFFICE VISIT FOR FURTHER REFILLS 04/20/24   Wendee Lynwood HERO, NP  sitaGLIPtin  (JANUVIA ) 100 MG tablet Take 1 tablet (100 mg total) by mouth daily. 04/20/24   Wendee Lynwood HERO, NP  verapamil  (VERELAN ) 240 MG 24 hr capsule Take by  mouth. 05/26/17   [provider]    Allergies: Metformin and Viagra [sildenafil citrate]    Review of Systems  Skin:  Positive for color change.    Updated Vital Signs BP (!) 141/80 (BP Location: Right Arm)   Pulse (!) 57   Temp 98.2 F (36.8 C) (Oral)   Resp 18   SpO2 99%   Physical Exam Vitals and nursing note reviewed.  Constitutional:      General: He is not in acute distress.    Appearance: He is not ill-appearing.  HENT:     Head: Normocephalic.  Eyes:     Pupils: Pupils are equal, round, and reactive to light.  Cardiovascular:     Rate and Rhythm: Normal rate and regular rhythm.     Pulses: Normal  pulses.     Heart sounds: Normal heart sounds. No murmur heard.    No friction rub. No gallop.  Pulmonary:     Effort: Pulmonary effort is normal.     Breath sounds: Normal breath sounds.  Abdominal:     General: Abdomen is flat. There is no distension.     Palpations: Abdomen is soft.     Tenderness: There is no abdominal tenderness. There is no guarding or rebound.  Musculoskeletal:        General: Normal range of motion.     Cervical back: Neck supple.     Comments: Ecchymosis to left hip region.  See photo below.  Bilateral lower extremities neurovascularly intact with soft compartments.  Skin:    General: Skin is warm and dry.  Neurological:     General: No focal deficit present.     Mental Status: He is alert.  Psychiatric:        Mood and Affect: Mood normal.        Behavior: Behavior normal.     (all labs ordered are listed, but only abnormal results are displayed) Labs Reviewed - No data to display  EKG: None  Radiology: No results found.   Procedures   Medications Ordered in the ED - No data to display                                  Medical Decision Making Amount and/or Complexity of Data Reviewed External Data Reviewed: radiology.    Details: Recent x-ray/CT   58 year old male presents to the ED after a fall that occurred 1 week ago.  Patient admits to worsening ecchymosis to left hip region.  No further injury. Patient currently on Xarelto .  Upon arrival, stable vitals.  Patient in no acute distress.  Ecchymosis to left hip region as noted above.  Lower extremities neurovascularly intact with soft compartments.  Patient able to ambulate.  Suspect worsening ecchymosis likely due to Xarelto .  Low suspicion for compartment syndrome. Possible small hematoma. Had long discussion with patient about healing process.  Patient already has pain medication at home. Advised patient to follow-up with PCP if symptoms do not improve over the next few days. Patient  stable for discharge. Strict ED precautions discussed with patient. Patient states understanding and agrees to plan. Patient discharged home in no acute distress and stable vitals  Discussed with Dr. Jerrol who agrees with assessment and plan.    Final diagnoses:  Left hip pain    ED Discharge Orders     None          Lorelle Fitch  C, PA-C 08/02/24 1335    Jerrol Agent, MD 08/02/24 1404

## 2024-08-02 NOTE — ED Triage Notes (Signed)
 Seen here last week for fall on left hip. Back today for bruise becoming bigger. Takes thinners and type 2 diabetic.   Ambulatory.

## 2024-08-02 NOTE — ED Notes (Signed)
 Discharge instructions and follow up care reviewed and explained, pt verbalized understanding and had no further questions on d/c. Pt caox4, ambulatory, NAD on d/c.

## 2024-08-02 NOTE — Discharge Instructions (Signed)
 It was a pleasure taking care of you today.  As discussed, I suspect your worsening bruising is likely due to your blood thinner. Keep taking blood thinner. Brusing should get better over the next few weeks. If it gets worse, please follow-up with PCP. Return to the ER for new or worsening symptoms.

## 2024-08-27 ENCOUNTER — Other Ambulatory Visit: Payer: Self-pay | Admitting: Nurse Practitioner

## 2024-08-27 DIAGNOSIS — I10 Essential (primary) hypertension: Secondary | ICD-10-CM

## 2024-09-05 ENCOUNTER — Other Ambulatory Visit: Payer: Self-pay | Admitting: Nurse Practitioner

## 2024-09-05 DIAGNOSIS — I2782 Chronic pulmonary embolism: Secondary | ICD-10-CM

## 2024-09-05 DIAGNOSIS — I4891 Unspecified atrial fibrillation: Secondary | ICD-10-CM

## 2024-09-05 NOTE — Telephone Encounter (Signed)
 Patient has been out of medication since Friday 9.5.25, needs refill asap  Patient is asking for a call to let him know if the refill will be called in today  [phone]

## 2024-09-05 NOTE — Telephone Encounter (Unsigned)
 Copied from CRM [id number]. Topic: Clinical - Medication Question >> Sep 05, 2024  1:48 PM Aleatha C wrote: Reason for CRM: Patient called to check on the status of his medication  90 day supply of Xarelto  20 mg, he says he desperately needs it filled today he has been advise of rx policy but he is out of medication and has been for 2 to 3 days

## 2024-09-05 NOTE — Telephone Encounter (Signed)
 He is over due for an office visit and no showed his last one on 08/01/2024. I will NOT provide any more refills after this one until he is seen in office  I know he has an appt on 09/26/2024

## 2024-09-06 NOTE — Telephone Encounter (Signed)
 Spoke with patient to communicate provider's expectation for patient to keep appointment. Pt indicated understanding that NO refills will be provided if pt is not seen in office.

## 2024-09-08 ENCOUNTER — Other Ambulatory Visit: Payer: Self-pay | Admitting: Nurse Practitioner

## 2024-09-08 DIAGNOSIS — E1169 Type 2 diabetes mellitus with other specified complication: Secondary | ICD-10-CM

## 2024-09-10 ENCOUNTER — Other Ambulatory Visit: Payer: Self-pay | Admitting: Nurse Practitioner

## 2024-09-10 DIAGNOSIS — I10 Essential (primary) hypertension: Secondary | ICD-10-CM

## 2024-09-16 ENCOUNTER — Other Ambulatory Visit: Payer: Self-pay | Admitting: Nurse Practitioner

## 2024-09-16 DIAGNOSIS — I4891 Unspecified atrial fibrillation: Secondary | ICD-10-CM

## 2024-09-16 DIAGNOSIS — I10 Essential (primary) hypertension: Secondary | ICD-10-CM

## 2024-09-16 MED ORDER — METOPROLOL SUCCINATE ER 50 MG PO TB24: 50.0000 mg | ORAL_TABLET | Freq: Every day | ORAL | 0 refills | Status: DC

## 2024-09-16 NOTE — Telephone Encounter (Signed)
 Copied from CRM [id number]. Topic: Clinical - Medication Refill >> Sep 16, 2024 10:41 AM Macario HERO wrote: Medication: metoprolol  succinate (TOPROL -XL) 50 MG 24 hr tablet [[id number]] methimazole  (TAPAZOLE ) 5 MG tablet [[id number]]  Has the patient contacted their pharmacy? Yes (Agent: If no, request that the patient contact the pharmacy for the refill. If patient does not wish to contact the pharmacy document the reason why and proceed with request.) (Agent: If yes, when and what did the pharmacy advise?) Pharmacy said Provider refused  This is the patient's preferred pharmacy:  [facility] [address] [place] - [address] [place] [address] Phone: [phone] Fax: [phone]   Is this the correct pharmacy for this prescription? Yes If no, delete pharmacy and type the correct one.   Has the prescription been filled recently? Yes  Is the patient out of the medication? Yes  Has the patient been seen for an appointment in the last year OR does the patient have an upcoming appointment? Yes  Can we respond through MyChart? No  Agent: Please be advised that Rx refills may take up to 3 business days. We ask that you follow-up with your pharmacy.

## 2024-09-21 ENCOUNTER — Telehealth: Payer: Self-pay | Admitting: Nurse Practitioner

## 2024-09-21 NOTE — Telephone Encounter (Signed)
Will route to office

## 2024-09-21 NOTE — Telephone Encounter (Unsigned)
 Copied from CRM [id number]. Topic: Clinical - Prescription Issue >> Sep 21, 2024  4:33 PM Darshell M wrote: Reason for CRM: Patient called to request prescription refills for his medication but he has already received some and she is not sure which ones. He will call in tomorrow with his medication list.

## 2024-09-26 ENCOUNTER — Ambulatory Visit: Admitting: Nurse Practitioner

## 2024-09-26 ENCOUNTER — Telehealth: Payer: Self-pay | Admitting: *Deleted

## 2024-09-26 ENCOUNTER — Encounter: Payer: Self-pay | Admitting: Nurse Practitioner

## 2024-09-26 VITALS — BP 122/78 | HR 67 | Temp 97.9°F | Ht 70.25 in | Wt 262.6 lb

## 2024-09-26 DIAGNOSIS — E1165 Type 2 diabetes mellitus with hyperglycemia: Secondary | ICD-10-CM

## 2024-09-26 DIAGNOSIS — G4733 Obstructive sleep apnea (adult) (pediatric): Secondary | ICD-10-CM

## 2024-09-26 DIAGNOSIS — E669 Obesity, unspecified: Secondary | ICD-10-CM

## 2024-09-26 DIAGNOSIS — Z125 Encounter for screening for malignant neoplasm of prostate: Secondary | ICD-10-CM

## 2024-09-26 DIAGNOSIS — Z7984 Long term (current) use of oral hypoglycemic drugs: Secondary | ICD-10-CM

## 2024-09-26 DIAGNOSIS — I4891 Unspecified atrial fibrillation: Secondary | ICD-10-CM | POA: Diagnosis not present

## 2024-09-26 DIAGNOSIS — I2782 Chronic pulmonary embolism: Secondary | ICD-10-CM

## 2024-09-26 DIAGNOSIS — Z0000 Encounter for general adult medical examination without abnormal findings: Secondary | ICD-10-CM | POA: Diagnosis not present

## 2024-09-26 DIAGNOSIS — I10 Essential (primary) hypertension: Secondary | ICD-10-CM | POA: Diagnosis not present

## 2024-09-26 DIAGNOSIS — E785 Hyperlipidemia, unspecified: Secondary | ICD-10-CM

## 2024-09-26 DIAGNOSIS — E1169 Type 2 diabetes mellitus with other specified complication: Secondary | ICD-10-CM

## 2024-09-26 DIAGNOSIS — Z8739 Personal history of other diseases of the musculoskeletal system and connective tissue: Secondary | ICD-10-CM | POA: Diagnosis not present

## 2024-09-26 LAB — COMPREHENSIVE METABOLIC PANEL WITH GFR
ALT: 15 U/L (ref 0–53)
AST: 16 U/L (ref 0–37)
Albumin: 4.2 g/dL (ref 3.5–5.2)
Alkaline Phosphatase: 72 U/L (ref 39–117)
BUN: 16 mg/dL (ref 6–23)
CO2: 28 meq/L (ref 19–32)
Calcium: 9.2 mg/dL (ref 8.4–10.5)
Chloride: 103 meq/L (ref 96–112)
Creatinine, Ser: 1.24 mg/dL (ref 0.40–1.50)
GFR: 63.77 mL/min (ref >60.00)
Glucose, Bld: 108 mg/dL — ABNORMAL HIGH (ref 70–99)
Potassium: 4.2 meq/L (ref 3.5–5.1)
Sodium: 137 meq/L (ref 135–145)
Total Bilirubin: 1 mg/dL (ref 0.2–1.2)
Total Protein: 7.2 g/dL (ref 6.0–8.3)

## 2024-09-26 LAB — LIPID PANEL
Cholesterol: 166 mg/dL (ref 0–200)
HDL: 48.3 mg/dL (ref >39.00)
LDL Cholesterol: 101 mg/dL — ABNORMAL HIGH (ref 0–99)
NonHDL: 117.38
Total CHOL/HDL Ratio: 3
Triglycerides: 82 mg/dL (ref 0.0–149.0)
VLDL: 16.4 mg/dL (ref 0.0–40.0)

## 2024-09-26 LAB — CBC WITH DIFFERENTIAL/PLATELET
Basophils Absolute: 0 K/uL (ref 0.0–0.1)
Basophils Relative: 0.4 % (ref 0.0–3.0)
Eosinophils Absolute: 0.3 K/uL (ref 0.0–0.7)
Eosinophils Relative: 7.2 % — ABNORMAL HIGH (ref 0.0–5.0)
HCT: 45 % (ref 39.0–52.0)
Hemoglobin: 14.4 g/dL (ref 13.0–17.0)
Lymphocytes Relative: 28.6 % (ref 12.0–46.0)
Lymphs Abs: 1.3 K/uL (ref 0.7–4.0)
MCHC: 32.1 g/dL (ref 30.0–36.0)
MCV: 87.4 fl (ref 78.0–100.0)
Monocytes Absolute: 0.6 K/uL (ref 0.1–1.0)
Monocytes Relative: 12.7 % — ABNORMAL HIGH (ref 3.0–12.0)
Neutro Abs: 2.3 K/uL (ref 1.4–7.7)
Neutrophils Relative %: 51.1 % (ref 43.0–77.0)
Platelets: 214 K/uL (ref 150.0–400.0)
RBC: 5.14 Mil/uL (ref 4.22–5.81)
RDW: 16.4 % — ABNORMAL HIGH (ref 11.5–15.5)
WBC: 4.5 K/uL (ref 4.0–10.5)

## 2024-09-26 LAB — URINALYSIS, MICROSCOPIC ONLY: RBC / HPF: NONE SEEN (ref 0–?)

## 2024-09-26 LAB — MICROALBUMIN / CREATININE URINE RATIO
Creatinine,U: 77.5 mg/dL
Microalb Creat Ratio: UNDETERMINED mg/g (ref 0.0–30.0)
Microalb, Ur: <0.7 mg/dL

## 2024-09-26 LAB — URIC ACID: Uric Acid, Serum: 6.6 mg/dL (ref 4.0–7.8)

## 2024-09-26 LAB — TSH: TSH: 1.38 u[IU]/mL (ref 0.35–5.50)

## 2024-09-26 LAB — POCT GLYCOSYLATED HEMOGLOBIN (HGB A1C): Hemoglobin A1C: 7 % — AB (ref 4.0–5.6)

## 2024-09-26 LAB — PSA: PSA: 1.07 ng/mL (ref 0.10–4.00)

## 2024-09-26 MED ORDER — FUROSEMIDE 40 MG PO TABS: 40.0000 mg | ORAL_TABLET | Freq: Every day | ORAL | 1 refills | Status: DC

## 2024-09-26 MED ORDER — METOPROLOL SUCCINATE ER 50 MG PO TB24: 50.0000 mg | ORAL_TABLET | Freq: Every day | ORAL | 1 refills | Status: DC

## 2024-09-26 MED ORDER — SITAGLIPTIN PHOSPHATE 100 MG PO TABS: 100.0000 mg | ORAL_TABLET | Freq: Every day | ORAL | 1 refills | Status: AC

## 2024-09-26 MED ORDER — RIVAROXABAN 20 MG PO TABS: 20.0000 mg | ORAL_TABLET | Freq: Every day | ORAL | 1 refills | Status: AC

## 2024-09-26 MED ORDER — DAPAGLIFLOZIN PROPANEDIOL 5 MG PO TABS: 5.0000 mg | ORAL_TABLET | Freq: Every day | ORAL | 1 refills | Status: AC

## 2024-09-26 MED ORDER — LOSARTAN POTASSIUM 100 MG PO TABS: 100.0000 mg | ORAL_TABLET | Freq: Every day | ORAL | 1 refills | Status: AC

## 2024-09-26 MED ORDER — ATORVASTATIN CALCIUM 40 MG PO TABS: 40.0000 mg | ORAL_TABLET | Freq: Every day | ORAL | 1 refills | Status: AC

## 2024-09-26 NOTE — Assessment & Plan Note (Signed)
 History of same.  Patient currently maintained on atorvastatin  40 mg daily.  Pending lipid panel today

## 2024-09-26 NOTE — Assessment & Plan Note (Signed)
 Maintained on CPAP therapy.  Was followed by pulmonology in the past.  Patient per his report has great insurance with good benefit.  Continue

## 2024-09-26 NOTE — Telephone Encounter (Signed)
 May I ask that y'all call him and give appointment so that I can call in Methimzole? Thank You

## 2024-09-26 NOTE — Assessment & Plan Note (Signed)
 Currently maintained on Xarelto  20 mg daily continue

## 2024-09-26 NOTE — Assessment & Plan Note (Signed)
 History of the same currently maintained on Januvia  100 mg daily and Farxiga  5 mg daily.  Patient has been out of Januvia  for a month.  A1c at 7.0 refill medications today follow-up in 3 months

## 2024-09-26 NOTE — Telephone Encounter (Signed)
 Pt was informed to try PCP or urgent care

## 2024-09-26 NOTE — Assessment & Plan Note (Signed)
 Discussed age-appropriate immunizations and screening exams.  Did review patient's personal, surgical, social, family histories.  Patient up-to-date on all age-appropriate vaccinations he would like.  Schedule flu and COVID-vaccine at pharmacy tomorrow per his report.  Patient up-to-date on CRC screening.  Patient to get PSA today for prostate cancer screening.  Patient was given information at discharge about preventative healthcare maintenance with anticipatory guidance.

## 2024-09-26 NOTE — Progress Notes (Signed)
 Established Patient Office Visit  Subjective   Patient ID: Spencer English, male    DOB: Sep 23, 1966  Age: 58 y.o. MRN: [id number]  Chief Complaint  Patient presents with   Annual Exam    HPI  Hypertension: Patient currently maintained on amlodipine  5 mg daily, furosemide  40 mg daily, losartan  100 mg daily, metoprolol  50 mg daily, verapamil  240 mg daily. Has a cuff if he needs it   DM2: Currently maintained on Farxiga  5 mg daily, Januvia  100 mg daily. States that he has not been chceking is sugars . Has been out of januvia  for approx 1 month  Hypothyroidism: Patient currently maintained on methimazole  5 mg daily followed by endocrinology  A-fib: Currently maintained on verapamil  240 mg daily, metoprolol  50 mg daily, Xarelto  20 mg daily. States that he no longer follows with cardiology   Gout: Patient currently maintained on allopurinol  300 mg daily. No recent gout flares  OSA: he uses it every night and gets 8 hours at a time. Was followed by pulmonology. Feels rested    for complete physical and follow up of chronic conditions.  Immunizations: -Tetanus: Completed in within 10 years  -Influenza:  schduled at [facility] -Shingles: discussed in office  -Pneumonia: updated at pharmacy this year per patient report -COVID: Original series  Diet: Fair diet. He is eating 2-3 meals a day. He is snacking (unhealthy). Unsweet tea and zero sodas. Also drinks water  Exercise: No regular exercise. States he has some hip trouble. Has a shot in the hip but has a follow up with ortho as he is still having pain   Eye exam: needs updating  Dental exam: Completes semi-annually    Colonoscopy: Completed in 08/21/2020, repeat colonoscopy in 10 years.  Patient due 2031 Lung Cancer Screening: NA   PSA: Due      Review of Systems  Constitutional:  Negative for chills and fever.  Respiratory:  Negative for shortness of breath.   Cardiovascular:  Negative for chest pain and leg swelling.   Gastrointestinal:  Negative for abdominal pain, blood in stool, constipation, diarrhea, nausea and vomiting.       BM daily   Genitourinary:  Negative for dysuria and hematuria.  Neurological:  Negative for dizziness, tingling and headaches.  Psychiatric/Behavioral:  Negative for hallucinations and suicidal ideas.       Objective:     BP 122/78   Pulse 67   Temp 97.9 F (36.6 C) (Oral)   Ht 5' 10.25 (1.784 m)   Wt 262 lb 9.6 oz (119.1 kg)   SpO2 98%   BMI 37.41 kg/m  BP Readings from Last 3 Encounters:  09/26/24 122/78  08/02/24 (!) 146/96  07/27/24 137/86   Wt Readings from Last 3 Encounters:  09/26/24 262 lb 9.6 oz (119.1 kg)  06/12/24 260 lb (117.9 kg)  10/14/23 263 lb 9.6 oz (119.6 kg)   SpO2 Readings from Last 3 Encounters:  09/26/24 98%  08/02/24 99%  07/27/24 98%      Physical Exam Vitals and nursing note reviewed.  Constitutional:      Appearance: Normal appearance.  HENT:     Right Ear: Tympanic membrane, ear canal and external ear normal.     Left Ear: Tympanic membrane, ear canal and external ear normal.     Mouth/Throat:     Mouth: Mucous membranes are moist.     Pharynx: Oropharynx is clear.  Eyes:     Extraocular Movements: Extraocular movements intact.  Pupils: Pupils are equal, round, and reactive to light.  Cardiovascular:     Rate and Rhythm: Normal rate and regular rhythm.     Pulses: Normal pulses.     Heart sounds: Normal heart sounds.  Pulmonary:     Effort: Pulmonary effort is normal.     Breath sounds: Normal breath sounds.  Abdominal:     General: Bowel sounds are normal. There is no distension.     Palpations: There is no mass.     Tenderness: There is no abdominal tenderness.     Hernia: No hernia is present.  Genitourinary:    Comments: deferred Musculoskeletal:     Right lower leg: No edema.     Left lower leg: No edema.  Lymphadenopathy:     Cervical: No cervical adenopathy.  Skin:    General: Skin is warm.   Neurological:     General: No focal deficit present.     Mental Status: He is alert.     Deep Tendon Reflexes:     Reflex Scores:      Bicep reflexes are 2+ on the right side and 2+ on the left side.      Patellar reflexes are 2+ on the right side and 2+ on the left side.    Comments: Bilateral upper and lower extremity strength 5/5  Psychiatric:        Mood and Affect: Mood normal.        Behavior: Behavior normal.        Thought Content: Thought content normal.        Judgment: Judgment normal.    Title   Diabetic Foot Exam - detailed Is there a history of foot ulcer?: No Is there a foot ulcer now?: No Is there swelling?: No Is there elevated skin temperature?: No Is there abnormal foot shape?: No Is there a claw toe deformity?: No Are the toenails long?: Yes Are the toenails thick?: Yes Are the toenails ingrown?: No Pulse Foot Exam completed.: Yes   Right Posterior Tibialis: Present Left posterior Tibialis: Present   Right Dorsalis Pedis: Present Left Dorsalis Pedis: Present     Sensory Foot Exam Completed.: Yes Semmes-Weinstein Monofilament Test + means has sensation and - means no sensation      Image components are not supported.   Image components are not supported. Image components are not supported.  [place] Comments All 10 sites sensation intact bilaterally  Moisture associated maceration in between 1st and 2nd toe on the right foot     Results for orders placed or performed in visit on 09/26/24  POCT glycosylated hemoglobin (Hb A1C)  Result Value Ref Range   Hemoglobin A1C 7.0 (A) 4.0 - 5.6 %   HbA1c POC (<> result, manual entry)     HbA1c, POC (prediabetic range)     HbA1c, POC (controlled diabetic range)        The 10-year ASCVD risk score (Arnett DK, et al., 2019) is: 33%    Assessment & Plan:   Problem List Items Addressed This Visit       Cardiovascular and Mediastinum   Essential hypertension   Amlodipine  5 mg,  furosemide  40 mg, losartan  100 mg, metoprolol  50 mg, verapamil  240 mg daily.  Continue medication as prescribed blood pressure controlled in office      Relevant Medications   furosemide  (LASIX ) 40 MG tablet   rivaroxaban  (XARELTO ) 20 MG TABS tablet   losartan  (COZAAR ) 100 MG tablet   atorvastatin  (LIPITOR)  40 MG tablet   metoprolol  succinate (TOPROL -XL) 50 MG 24 hr tablet   Other Relevant Orders   CBC with Differential/Platelet   Comprehensive metabolic panel with GFR   Urine Microscopic   TSH   Lipid panel   Chronic pulmonary embolism (HCC)   Currently maintained on Xarelto  20 mg daily continue      Relevant Medications   furosemide  (LASIX ) 40 MG tablet   rivaroxaban  (XARELTO ) 20 MG TABS tablet   losartan  (COZAAR ) 100 MG tablet   atorvastatin  (LIPITOR) 40 MG tablet   metoprolol  succinate (TOPROL -XL) 50 MG 24 hr tablet   A-fib (HCC)   History of same previous ablation currently maintained on Xarelto  20 mg daily.  Continue      Relevant Medications   furosemide  (LASIX ) 40 MG tablet   rivaroxaban  (XARELTO ) 20 MG TABS tablet   losartan  (COZAAR ) 100 MG tablet   atorvastatin  (LIPITOR) 40 MG tablet   metoprolol  succinate (TOPROL -XL) 50 MG 24 hr tablet     Respiratory   OSA on CPAP   Maintained on CPAP therapy.  Was followed by pulmonology in the past.  Patient per his report has great insurance with good benefit.  Continue        Endocrine   Diabetes mellitus (HCC)   History of the same currently maintained on Januvia  100 mg daily and Farxiga  5 mg daily.  Patient has been out of Januvia  for a month.  A1c at 7.0 refill medications today follow-up in 3 months      Relevant Medications   losartan  (COZAAR ) 100 MG tablet   sitaGLIPtin  (JANUVIA ) 100 MG tablet   atorvastatin  (LIPITOR) 40 MG tablet   dapagliflozin  propanediol (FARXIGA ) 5 MG TABS tablet   Other Relevant Orders   POCT glycosylated hemoglobin (Hb A1C) (Completed)   Microalbumin / creatinine urine ratio    Hyperlipidemia associated with type 2 diabetes mellitus (HCC)   History of same.  Patient currently maintained on atorvastatin  40 mg daily.  Pending lipid panel today      Relevant Medications   furosemide  (LASIX ) 40 MG tablet   rivaroxaban  (XARELTO ) 20 MG TABS tablet   losartan  (COZAAR ) 100 MG tablet   sitaGLIPtin  (JANUVIA ) 100 MG tablet   atorvastatin  (LIPITOR) 40 MG tablet   dapagliflozin  propanediol (FARXIGA ) 5 MG TABS tablet   metoprolol  succinate (TOPROL -XL) 50 MG 24 hr tablet     Other   Preventative health care - Primary   Discussed age-appropriate immunizations and screening exams.  Did review patient's personal, surgical, social, family histories.  Patient up-to-date on all age-appropriate vaccinations he would like.  Schedule flu and COVID-vaccine at pharmacy tomorrow per his report.  Patient up-to-date on CRC screening.  Patient to get PSA today for prostate cancer screening.  Patient was given information at discharge about preventative healthcare maintenance with anticipatory guidance.      Relevant Orders   CBC with Differential/Platelet   Comprehensive metabolic panel with GFR   TSH   History of gout   Currently maintained on allopurinol  300 mg daily.  Pending uric acid level      Relevant Orders   Uric acid   Obesity (BMI 30-39.9)   Pending TSH A1c and lipid panel.  Continue working on healthy lifestyle modifications      Relevant Medications   sitaGLIPtin  (JANUVIA ) 100 MG tablet   dapagliflozin  propanediol (FARXIGA ) 5 MG TABS tablet   Other Visit Diagnoses       Screening for prostate cancer  Relevant Orders   PSA       Return in about 3 months (around 12/26/2024) for DM recheck.    Adina Crandall, NP

## 2024-09-26 NOTE — Assessment & Plan Note (Signed)
Pending TSH A1c and lipid panel.  Continue working on healthy lifestyle modifications

## 2024-09-26 NOTE — Assessment & Plan Note (Signed)
 Amlodipine  5 mg, furosemide  40 mg, losartan  100 mg, metoprolol  50 mg, verapamil  240 mg daily.  Continue medication as prescribed blood pressure controlled in office

## 2024-09-26 NOTE — Telephone Encounter (Signed)
 Yes, we can do that, that would be reasonable but make sure he is aware that he wont get more refills until he is seen in office.  He can also be seen by his PCP in the meantime.

## 2024-09-26 NOTE — Telephone Encounter (Signed)
 For both his safety and mine, I cannot send in another refill on his medications until he is seen in office.  It was discussed with him on the initial visit that he would need routine follow ups to make necessary adjustments to his thyroid  medications.  He has missed several appointments (either with no show or rescheduling) since then.  We have been more than accommodating to try and overbook for him.  I agree with this plan.  Perhaps his PCP would be willing to give a refill on it until he can be seen by our office as they have just recently seen him.

## 2024-09-26 NOTE — Patient Instructions (Signed)
 Nice to see you today I will be in touch with the labs once I have them Follow up with me in 3 months, sooner if you need me

## 2024-09-26 NOTE — Telephone Encounter (Signed)
 Patient has left a message that he needs to make appointment to be seen as soon as possible so that he can get his Methimazole  filled. The patient was last seen 10/14/2023. He has canceled  appointments since them and maybe a no show. We have received pharmacy refill request for th is medication bur denied due to the need of a office appointment.

## 2024-09-26 NOTE — Telephone Encounter (Signed)
 Called pt to let pt know Whitney had 2 cancellations for this week, and that she would use just the TSH that was collected at his appt this morning.  Pt stated he couldn't make either of those, per Whitney informed pt that since he had not been seen in person in almost a year and she had availability that she could not call in any refills on the medicine.  Pt then argued for over 5 minutes about coming here and paying copays.  Pt stated he would try to come to appt that was open on 09/29/24 at 11:30 but was refusing to pay a copay.  Informed pt that copays would be required at time of service to be seen by provider.  Pt then hung up the phone.

## 2024-09-26 NOTE — Assessment & Plan Note (Signed)
 History of same previous ablation currently maintained on Xarelto  20 mg daily.  Continue

## 2024-09-26 NOTE — Assessment & Plan Note (Signed)
 Currently maintained on allopurinol  300 mg daily.  Pending uric acid level

## 2024-09-27 NOTE — Telephone Encounter (Signed)
 Noted

## 2024-09-28 ENCOUNTER — Ambulatory Visit: Payer: Self-pay | Admitting: Nurse Practitioner

## 2024-09-28 NOTE — Telephone Encounter (Signed)
 Copied from CRM [id number]. Topic: Clinical - Lab/Test Results >> Sep 28, 2024  1:04 PM Alfonso HERO wrote: Reason for CRM: patient would like someone to call and go over his labs with him. He see's them in [place] but doesn't know what he is reading.

## 2024-09-29 ENCOUNTER — Ambulatory Visit: Admitting: Nurse Practitioner

## 2024-09-29 DIAGNOSIS — E0590 Thyrotoxicosis, unspecified without thyrotoxic crisis or storm: Secondary | ICD-10-CM

## 2024-09-29 NOTE — Telephone Encounter (Signed)
 We traditionally monitor this through blood test (PSA), which I did and came back normal  If he would like a physical prostate exam when I see him next he can remind me and we can do it then

## 2024-09-29 NOTE — Telephone Encounter (Signed)
-----   Message from [facility] T sent at 09/28/2024  4:08 PM EDT ----- Called patient reviewed all information and repeated back to me. Will call if any questions.    Pt would like to know how he can have a Cancer screening done for Prostate Cancer. States that his dad had this 5 years ago but has recently came back.  ----- Message ----- From: Katrina No Sent: 09/28/2024   2:22 PM EDT To: Wendee Gunnels  ----- Message from No Katrina sent at 09/28/2024  2:22 PM EDT -----

## 2024-10-02 ENCOUNTER — Other Ambulatory Visit: Payer: Self-pay | Admitting: Nurse Practitioner

## 2024-10-02 DIAGNOSIS — I4891 Unspecified atrial fibrillation: Secondary | ICD-10-CM

## 2024-10-02 DIAGNOSIS — I2782 Chronic pulmonary embolism: Secondary | ICD-10-CM

## 2024-10-23 ENCOUNTER — Encounter (HOSPITAL_BASED_OUTPATIENT_CLINIC_OR_DEPARTMENT_OTHER): Payer: Self-pay

## 2024-10-23 ENCOUNTER — Other Ambulatory Visit: Payer: Self-pay

## 2024-10-23 ENCOUNTER — Emergency Department (HOSPITAL_BASED_OUTPATIENT_CLINIC_OR_DEPARTMENT_OTHER)
Admission: EM | Admit: 2024-10-23 | Discharge: 2024-10-23 | Disposition: A | Discharge status: Home / Self Care | Attending: Emergency Medicine | Admitting: Emergency Medicine

## 2024-10-23 DIAGNOSIS — Z760 Encounter for issue of repeat prescription: Secondary | ICD-10-CM | POA: Diagnosis not present

## 2024-10-23 MED ORDER — METHIMAZOLE 5 MG PO TABS: 5.0000 mg | ORAL_TABLET | Freq: Every day | ORAL | 0 refills | Status: AC

## 2024-10-23 NOTE — ED Provider Notes (Signed)
 [place] EMERGENCY DEPARTMENT AT [facility] Provider Note   CSN: [id number] Arrival date & time: 10/23/24  9249     Patient presents with: Medication Refill   Spencer English is a 58 y.o. male.   Patient is a 58 year old male with history of hyperthyroidism.  Patient has been out of his methimazole  for the past month after missing an appointment with his endocrinologist.  Patient has been on the road as a truck driver and was unable to make his appointment due to this.  He is requesting a refill of his methimazole .  He has no complaints otherwise.       Prior to Admission medications   Medication Sig Start Date End Date Taking? Authorizing Provider  allopurinol  (ZYLOPRIM ) 300 MG tablet Take by mouth. 12/07/18   [provider]  amLODipine  (NORVASC ) 5 MG tablet TAKE 1 TABLET(5 MG) BY MOUTH DAILY 08/27/24   Webb, Padonda B, FNP  atorvastatin  (LIPITOR) 40 MG tablet Take 1 tablet (40 mg total) by mouth daily. 09/26/24   Wendee Lynwood HERO, NP  benzonatate  (TESSALON ) 100 MG capsule Take 1 capsule (100 mg total) by mouth every 8 (eight) hours. 02/25/24   Barrett, Jamie N, PA-C  cyclobenzaprine  (FLEXERIL ) 10 MG tablet Take 1 tablet (10 mg total) by mouth 2 (two) times daily as needed for muscle spasms. 06/12/24   Doretha Folks, MD  dapagliflozin  propanediol (FARXIGA ) 5 MG TABS tablet Take 1 tablet (5 mg total) by mouth daily. 09/26/24   Wendee Lynwood HERO, NP  diclofenac  (VOLTAREN ) 75 MG EC tablet Take 1 tablet (75 mg total) by mouth 2 (two) times daily. 02/04/24   Magdalen Pasco RAMAN, DPM  furosemide  (LASIX ) 40 MG tablet Take 1 tablet (40 mg total) by mouth daily. 09/26/24   Wendee Lynwood HERO, NP  glucose blood (CONTOUR NEXT TEST) test strip 1 each by Other route daily. And lancets 1/day 09/02/19   Kassie Mallick, MD  losartan  (COZAAR ) 100 MG tablet Take 1 tablet (100 mg total) by mouth daily. 09/26/24   Wendee Lynwood HERO, NP  Meclizine HCl 25 MG CHEW Chew by mouth. 05/26/17   [provider]  methimazole  (TAPAZOLE ) 5 MG tablet Take 1 tablet (5 mg total) by mouth daily. 10/14/23   Therisa Folks PARAS, NP  methylPREDNISolone  (MEDROL  DOSEPAK) 4 MG TBPK tablet Take according to box directions 06/12/24   Doretha Folks, MD  metoprolol  succinate (TOPROL -XL) 50 MG 24 hr tablet Take 1 tablet (50 mg total) by mouth daily. 09/26/24   Wendee Lynwood HERO, NP  oxyCODONE  (OXY IR/ROXICODONE ) 5 MG immediate release tablet Take 1 tablet (5 mg total) by mouth every 6 (six) hours as needed. 07/27/24   Geiple, Joshua, PA-C  polyvinyl alcohol (LIQUIFILM TEARS) 1.4 % ophthalmic solution Place 1 drop into both eyes as needed for dry eyes.    [provider]  rivaroxaban  (XARELTO ) 20 MG TABS tablet Take 1 tablet (20 mg total) by mouth daily with supper. 09/26/24   Wendee Lynwood HERO, NP  sitaGLIPtin  (JANUVIA ) 100 MG tablet Take 1 tablet (100 mg total) by mouth daily. 09/26/24   Wendee Lynwood HERO, NP  verapamil  (VERELAN ) 240 MG 24 hr capsule Take by mouth. 05/26/17   [provider]    Allergies: Metformin and Viagra [sildenafil citrate]    Review of Systems  All other systems reviewed and are negative.   Updated Vital Signs BP (!) 144/105 (BP Location: Right Arm)   Pulse 73   Temp 97.8 F (36.6  C) (Oral)   Resp 18   Ht 5' 10 (1.778 m)   Wt 117.9 kg   SpO2 100%   BMI 37.31 kg/m   Physical Exam Vitals and nursing note reviewed.  Constitutional:      Appearance: Normal appearance.  HENT:     Head: Normocephalic.  Pulmonary:     Effort: Pulmonary effort is normal.  Musculoskeletal:     Cervical back: Normal range of motion.  Neurological:     Mental Status: He is alert and oriented to person, place, and time.     (all labs ordered are listed, but only abnormal results are displayed) Labs Reviewed - No data to display  EKG: None  Radiology: No results found.   Procedures   Medications Ordered in the ED - No data to display                                   Medical Decision Making  Patient presenting requesting a refill of methimazole .  He has no symptoms otherwise.  Prescription will be refilled.  Patient to follow-up with his endocrinologist.     Final diagnoses:  None    ED Discharge Orders     None          Geroldine Berg, MD 10/23/24 [phone]

## 2024-10-23 NOTE — ED Triage Notes (Signed)
 Pt reports he is out of his thyroid  med. Pt reports he missed his endochronology appointment and advised to come to ER.

## 2024-10-23 NOTE — Discharge Instructions (Signed)
 Follow-up with your endocrinologist and return to the ER if you experience any new and/or concerning issues.

## 2024-10-23 NOTE — ED Triage Notes (Signed)
 Pt reports being out of Methamazole x1 month.

## 2024-11-09 ENCOUNTER — Emergency Department (HOSPITAL_BASED_OUTPATIENT_CLINIC_OR_DEPARTMENT_OTHER)
Admission: EM | Admit: 2024-11-09 | Discharge: 2024-11-09 | Disposition: A | Discharge status: Home / Self Care | Attending: Emergency Medicine | Admitting: Emergency Medicine

## 2024-11-09 ENCOUNTER — Encounter (HOSPITAL_BASED_OUTPATIENT_CLINIC_OR_DEPARTMENT_OTHER): Payer: Self-pay | Admitting: Emergency Medicine

## 2024-11-09 ENCOUNTER — Other Ambulatory Visit: Payer: Self-pay

## 2024-11-09 DIAGNOSIS — Z7901 Long term (current) use of anticoagulants: Secondary | ICD-10-CM | POA: Diagnosis not present

## 2024-11-09 DIAGNOSIS — Z79899 Other long term (current) drug therapy: Secondary | ICD-10-CM | POA: Diagnosis not present

## 2024-11-09 DIAGNOSIS — Z720 Tobacco use: Secondary | ICD-10-CM | POA: Diagnosis not present

## 2024-11-09 DIAGNOSIS — I10 Essential (primary) hypertension: Secondary | ICD-10-CM | POA: Diagnosis not present

## 2024-11-09 DIAGNOSIS — M109 Gout, unspecified: Secondary | ICD-10-CM | POA: Insufficient documentation

## 2024-11-09 DIAGNOSIS — M79671 Pain in right foot: Secondary | ICD-10-CM | POA: Diagnosis not present

## 2024-11-09 DIAGNOSIS — Z7984 Long term (current) use of oral hypoglycemic drugs: Secondary | ICD-10-CM | POA: Insufficient documentation

## 2024-11-09 DIAGNOSIS — E119 Type 2 diabetes mellitus without complications: Secondary | ICD-10-CM | POA: Diagnosis not present

## 2024-11-09 MED ORDER — COLCHICINE 0.6 MG PO TABS: 0.6000 mg | ORAL_TABLET | Freq: Every day | ORAL | 0 refills | Status: AC

## 2024-11-09 MED ORDER — ALLOPURINOL 300 MG PO TABS: 300.0000 mg | ORAL_TABLET | Freq: Every day | ORAL | 2 refills | Status: AC

## 2024-11-09 NOTE — ED Provider Notes (Signed)
 [place] EMERGENCY DEPARTMENT AT [facility] Provider Note   CSN: [id number] Arrival date & time: 11/09/24  1014     Patient presents with: Foot Pain   Spencer English is a 58 y.o. male with past medical history seen for diabetes, hypertension, tobacco use, gout who presents with concern for right foot and ankle pain for 1 day.  He reports it feels similar to previous gout flare.  He is no longer taking allopurinol .  He denies any injury.  He rates the pain 10/10.  He reports that he has tried prednisone  in the past, but has heard that colchicine is quite effective and would like to try it.  He denies any previous history of GI bleeding.    Foot Pain       Prior to Admission medications   Medication Sig Start Date End Date Taking? Authorizing Provider  colchicine 0.6 MG tablet Take 1 tablet (0.6 mg total) by mouth daily. Take 2 tablets by mouth when you first get the prescription 11/09/24  Yes Loisann Roach H, PA-C  allopurinol  (ZYLOPRIM ) 300 MG tablet Take 1 tablet (300 mg total) by mouth daily. 11/09/24   Denece Shearer H, PA-C  amLODipine  (NORVASC ) 5 MG tablet TAKE 1 TABLET(5 MG) BY MOUTH DAILY 08/27/24   Webb, Padonda B, FNP  atorvastatin  (LIPITOR) 40 MG tablet Take 1 tablet (40 mg total) by mouth daily. 09/26/24   Wendee Lynwood HERO, NP  benzonatate  (TESSALON ) 100 MG capsule Take 1 capsule (100 mg total) by mouth every 8 (eight) hours. 02/25/24   Barrett, Jamie N, PA-C  cyclobenzaprine  (FLEXERIL ) 10 MG tablet Take 1 tablet (10 mg total) by mouth 2 (two) times daily as needed for muscle spasms. 06/12/24   Doretha Folks, MD  dapagliflozin  propanediol (FARXIGA ) 5 MG TABS tablet Take 1 tablet (5 mg total) by mouth daily. 09/26/24   Wendee Lynwood HERO, NP  diclofenac  (VOLTAREN ) 75 MG EC tablet Take 1 tablet (75 mg total) by mouth 2 (two) times daily. 02/04/24   Magdalen Pasco RAMAN, DPM  furosemide  (LASIX ) 40 MG tablet Take 1 tablet (40 mg total) by mouth daily. 09/26/24   Wendee Lynwood HERO, NP  glucose blood (CONTOUR NEXT TEST) test strip 1 each by Other route daily. And lancets 1/day 09/02/19   Kassie Mallick, MD  losartan  (COZAAR ) 100 MG tablet Take 1 tablet (100 mg total) by mouth daily. 09/26/24   Wendee Lynwood HERO, NP  Meclizine HCl 25 MG CHEW Chew by mouth. 05/26/17   [provider]  methimazole  (TAPAZOLE ) 5 MG tablet Take 1 tablet (5 mg total) by mouth daily. 10/23/24   Geroldine Berg, MD  methylPREDNISolone  (MEDROL  DOSEPAK) 4 MG TBPK tablet Take according to box directions 06/12/24   Doretha Folks, MD  metoprolol  succinate (TOPROL -XL) 50 MG 24 hr tablet Take 1 tablet (50 mg total) by mouth daily. 09/26/24   Wendee Lynwood HERO, NP  oxyCODONE  (OXY IR/ROXICODONE ) 5 MG immediate release tablet Take 1 tablet (5 mg total) by mouth every 6 (six) hours as needed. 07/27/24   Geiple, Joshua, PA-C  polyvinyl alcohol (LIQUIFILM TEARS) 1.4 % ophthalmic solution Place 1 drop into both eyes as needed for dry eyes.    [provider]  rivaroxaban  (XARELTO ) 20 MG TABS tablet Take 1 tablet (20 mg total) by mouth daily with supper. 09/26/24   Wendee Lynwood HERO, NP  sitaGLIPtin  (JANUVIA ) 100 MG tablet Take 1 tablet (100 mg total) by mouth daily. 09/26/24   Wendee Lynwood HERO,  NP  verapamil  (VERELAN ) 240 MG 24 hr capsule Take by mouth. 05/26/17   [provider]    Allergies: Metformin and Viagra [sildenafil citrate]    Review of Systems  All other systems reviewed and are negative.   Updated Vital Signs BP (!) 148/90 (BP Location: Right Arm)   Pulse 88   Temp 97.9 F (36.6 C) (Oral)   Resp 18   SpO2 100%   Physical Exam Vitals and nursing note reviewed.  Constitutional:      General: He is not in acute distress.    Appearance: Normal appearance.  HENT:     Head: Normocephalic and atraumatic.  Eyes:     General:        Right eye: No discharge.        Left eye: No discharge.  Cardiovascular:     Rate and Rhythm: Normal rate and regular rhythm.     Pulses:  Normal pulses.  Pulmonary:     Effort: Pulmonary effort is normal. No respiratory distress.  Musculoskeletal:        General: Swelling present. No deformity.     Comments: Red, swollen, tender right lateral midfoot, extending towards right ankle, normal range of motion to dorsiflexion, plantarflexion  Skin:    General: Skin is warm and dry.  Neurological:     Mental Status: He is alert and oriented to person, place, and time.  Psychiatric:        Mood and Affect: Mood normal.        Behavior: Behavior normal.     (all labs ordered are listed, but only abnormal results are displayed) Labs Reviewed - No data to display  EKG: None  Radiology: No results found.   Procedures   Medications Ordered in the ED - No data to display                                  Medical Decision Making Risk Prescription drug management.   This is an overall well appearing 58 year old male who presents with concern for foot pain, ankle pain, history of gout.  Reports it feels similar.  No longer taking allopurinol .  He denies any injury.  On exam patient with red, swollen, tender right lateral foot.  No step-off, deformity.  Considered imaging but given no traumatic injury, and previous history of gout I do feel like this is consistent with gout.  Patient is neurovascularly intact any affected right lower extremity, will plan to treat with colchicine, refill patient's allopurinol , patient discharged in stable condition.  Final diagnoses:  Acute gout of right foot, unspecified cause    ED Discharge Orders          Ordered    colchicine 0.6 MG tablet  Daily        11/09/24 1052    allopurinol  (ZYLOPRIM ) 300 MG tablet  Daily        11/09/24 1052               Cressida Milford, [place] H, PA-C 11/09/24 1059    Lenor Hollering, MD 11/09/24 1319

## 2024-11-09 NOTE — ED Triage Notes (Signed)
 R foot and ankle pain x 1 day. Denies injury to area. Hx of gout.

## 2024-11-09 NOTE — Discharge Instructions (Addendum)
 Please use Tylenol  for pain.  You may use 1000 mg of Tylenol  every 6 hours.  Not to exceed 4 g of Tylenol  within 24 hours.  Take the colchicine as directed, 2 tablets at first sign of inflammation, (go ahead and take them when you pick up the prescription), followed by 1 tablet daily after that.  I would stop taking the tablets once the pain starts to resolve, but continue taking your home allopurinol  that I refilled to prevent further flares.

## 2024-11-23 ENCOUNTER — Other Ambulatory Visit: Payer: Self-pay | Admitting: Nurse Practitioner

## 2024-11-23 DIAGNOSIS — I4891 Unspecified atrial fibrillation: Secondary | ICD-10-CM

## 2024-11-23 DIAGNOSIS — I10 Essential (primary) hypertension: Secondary | ICD-10-CM

## 2024-11-30 ENCOUNTER — Ambulatory Visit: Admitting: Nurse Practitioner

## 2024-11-30 DIAGNOSIS — E0590 Thyrotoxicosis, unspecified without thyrotoxic crisis or storm: Secondary | ICD-10-CM

## 2025-01-05 ENCOUNTER — Ambulatory Visit: Admitting: Nurse Practitioner

## 2025-03-01 ENCOUNTER — Encounter: Admitting: Nurse Practitioner
